# Patient Record
Sex: Female | Born: 1945 | Race: White | Hispanic: No | Smoking: Current every day smoker
Health system: Southern US, Community
[De-identification: ages and names within clinical notes are randomized; demographics above are authoritative.]

## PROBLEM LIST (undated history)

## (undated) DIAGNOSIS — I10 Essential (primary) hypertension: Secondary | ICD-10-CM

## (undated) DIAGNOSIS — E785 Hyperlipidemia, unspecified: Secondary | ICD-10-CM

## (undated) DIAGNOSIS — J449 Chronic obstructive pulmonary disease, unspecified: Secondary | ICD-10-CM

## (undated) DIAGNOSIS — Z87891 Personal history of nicotine dependence: Secondary | ICD-10-CM

## (undated) DIAGNOSIS — M810 Age-related osteoporosis without current pathological fracture: Secondary | ICD-10-CM

## (undated) DIAGNOSIS — J439 Emphysema, unspecified: Secondary | ICD-10-CM

## (undated) DIAGNOSIS — T7840XA Allergy, unspecified, initial encounter: Secondary | ICD-10-CM

## (undated) DIAGNOSIS — Z87898 Personal history of other specified conditions: Secondary | ICD-10-CM

## (undated) DIAGNOSIS — F419 Anxiety disorder, unspecified: Secondary | ICD-10-CM

## (undated) DIAGNOSIS — I739 Peripheral vascular disease, unspecified: Secondary | ICD-10-CM

## (undated) DIAGNOSIS — I7300 Raynaud's syndrome without gangrene: Secondary | ICD-10-CM

## (undated) DIAGNOSIS — C679 Malignant neoplasm of bladder, unspecified: Secondary | ICD-10-CM

## (undated) DIAGNOSIS — I209 Angina pectoris, unspecified: Secondary | ICD-10-CM

## (undated) DIAGNOSIS — I2510 Atherosclerotic heart disease of native coronary artery without angina pectoris: Secondary | ICD-10-CM

## (undated) DIAGNOSIS — K219 Gastro-esophageal reflux disease without esophagitis: Secondary | ICD-10-CM

## (undated) DIAGNOSIS — Z9861 Coronary angioplasty status: Secondary | ICD-10-CM

## (undated) DIAGNOSIS — E039 Hypothyroidism, unspecified: Secondary | ICD-10-CM

## (undated) DIAGNOSIS — F329 Major depressive disorder, single episode, unspecified: Secondary | ICD-10-CM

## (undated) HISTORY — DX: Coronary angioplasty status: Z98.61

## (undated) HISTORY — DX: Emphysema, unspecified: J43.9

## (undated) HISTORY — DX: Major depressive disorder, single episode, unspecified: F32.9

## (undated) HISTORY — DX: Hyperlipidemia, unspecified: E78.5

## (undated) HISTORY — DX: Raynaud's syndrome without gangrene: I73.00

## (undated) HISTORY — DX: Personal history of nicotine dependence: Z87.891

## (undated) HISTORY — DX: Atherosclerotic heart disease of native coronary artery without angina pectoris: I25.10

## (undated) HISTORY — DX: Allergy, unspecified, initial encounter: T78.40XA

## (undated) HISTORY — DX: Gastro-esophageal reflux disease without esophagitis: K21.9

## (undated) HISTORY — DX: Age-related osteoporosis without current pathological fracture: M81.0

## (undated) HISTORY — DX: Angina pectoris, unspecified: I20.9

## (undated) HISTORY — DX: Essential (primary) hypertension: I10

## (undated) HISTORY — PX: NM MYOVIEW LTD: HXRAD82

## (undated) HISTORY — DX: Hypothyroidism, unspecified: E03.9

## (undated) HISTORY — PX: ABDOMINAL HYSTERECTOMY: SHX81

## (undated) HISTORY — DX: Personal history of other specified conditions: Z87.898

---
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
## 1986-08-31 HISTORY — PX: BREAST BIOPSY: SHX20

## 1999-09-01 HISTORY — PX: COLONOSCOPY: SHX174

## 2000-09-28 ENCOUNTER — Other Ambulatory Visit: Admission: RE | Admit: 2000-09-28 | Discharge: 2000-09-28 | Payer: Self-pay | Admitting: Family Medicine

## 2002-10-12 ENCOUNTER — Other Ambulatory Visit: Admission: RE | Admit: 2002-10-12 | Discharge: 2002-10-12 | Payer: Self-pay | Admitting: Family Medicine

## 2003-11-08 ENCOUNTER — Encounter: Admission: RE | Admit: 2003-11-08 | Discharge: 2003-11-08 | Payer: Self-pay | Admitting: Family Medicine

## 2003-11-12 ENCOUNTER — Other Ambulatory Visit: Admission: RE | Admit: 2003-11-12 | Discharge: 2003-11-12 | Payer: Self-pay | Admitting: Family Medicine

## 2004-08-31 HISTORY — PX: COLONOSCOPY: SHX174

## 2004-11-12 ENCOUNTER — Other Ambulatory Visit: Admission: RE | Admit: 2004-11-12 | Discharge: 2004-11-12 | Payer: Self-pay | Admitting: Family Medicine

## 2004-11-13 ENCOUNTER — Encounter: Admission: RE | Admit: 2004-11-13 | Discharge: 2004-11-13 | Payer: Self-pay | Admitting: Family Medicine

## 2004-12-01 ENCOUNTER — Ambulatory Visit: Payer: Self-pay | Admitting: Internal Medicine

## 2004-12-23 ENCOUNTER — Ambulatory Visit: Payer: Self-pay | Admitting: Internal Medicine

## 2005-08-31 LAB — HM COLONOSCOPY: HM Colonoscopy: NORMAL

## 2005-10-01 DIAGNOSIS — I2510 Atherosclerotic heart disease of native coronary artery without angina pectoris: Secondary | ICD-10-CM

## 2005-10-01 HISTORY — DX: Atherosclerotic heart disease of native coronary artery without angina pectoris: I25.10

## 2005-10-14 ENCOUNTER — Ambulatory Visit (HOSPITAL_COMMUNITY): Admission: RE | Admit: 2005-10-14 | Discharge: 2005-10-15 | Payer: Self-pay | Admitting: Cardiovascular Disease

## 2005-10-14 HISTORY — PX: CORONARY ANGIOPLASTY WITH STENT PLACEMENT: SHX49

## 2005-11-13 ENCOUNTER — Encounter: Admission: RE | Admit: 2005-11-13 | Discharge: 2005-11-13 | Payer: Self-pay | Admitting: Family Medicine

## 2005-11-16 ENCOUNTER — Encounter: Admission: RE | Admit: 2005-11-16 | Discharge: 2005-11-16 | Payer: Self-pay | Admitting: Cardiovascular Disease

## 2005-11-16 ENCOUNTER — Encounter (INDEPENDENT_AMBULATORY_CARE_PROVIDER_SITE_OTHER): Payer: Self-pay | Admitting: *Deleted

## 2007-04-12 ENCOUNTER — Encounter: Admission: RE | Admit: 2007-04-12 | Discharge: 2007-04-12 | Payer: Self-pay | Admitting: Family Medicine

## 2009-01-22 ENCOUNTER — Encounter: Admission: RE | Admit: 2009-01-22 | Discharge: 2009-01-22 | Payer: Self-pay | Admitting: Family Medicine

## 2009-05-22 HISTORY — PX: TRANSTHORACIC ECHOCARDIOGRAM: SHX275

## 2009-11-18 ENCOUNTER — Encounter (INDEPENDENT_AMBULATORY_CARE_PROVIDER_SITE_OTHER): Payer: Self-pay | Admitting: *Deleted

## 2010-02-10 ENCOUNTER — Encounter: Admission: RE | Admit: 2010-02-10 | Discharge: 2010-02-10 | Payer: Self-pay | Admitting: Family Medicine

## 2010-09-30 NOTE — Letter (Signed)
Summary: Colonoscopy Date Change Letter  [facility]  [address] [place], [place] [address]   Phone: [phone]  Fax: [phone]      November 18, 2009 MRN: [id number]   Alexa Orozco [address] [place], [place]  [address]   Dear Alexa Orozco,   Previously you were recommended to have a repeat colonoscopy around this time. Your chart was recently reviewed by Dr. Hedwig Morton. Juanda Chance of [facility]. Follow up colonoscopy is now recommended in April 2013. This revised recommendation is based on current, nationally recognized guidelines for colorectal cancer screening and polyp surveillance. These guidelines are endorsed by the American Cancer Society, [employer] on Colorectal Cancer as well as numerous other major medical organizations.  Please understand that our recommendation assumes that you do not have any new symptoms such as bleeding, a change in bowel habits, anemia, or significant abdominal discomfort. If you do have any concerning GI symptoms or want to discuss the guideline recommendations, please call to arrange an office visit at your earliest convenience. Otherwise we will keep you in our reminder system and contact you 1-2 months prior to the date listed above to schedule your next colonoscopy.  Thank you,  Hedwig Morton. Juanda Chance, M.D.  [facility] [phone]

## 2011-01-16 NOTE — Discharge Summary (Signed)
Alexa Orozco, HEYDT NO.:  [id number]   MEDICAL RECORD NO.:  [id number]          PATIENT TYPE:  OIB   LOCATION:  6526                         FACILITY:  [facility]   PHYSICIAN:  Nicki Guadalajara, M.D.     DATE OF BIRTH:  1946-03-13   DATE OF ADMISSION:  10/14/2005  DATE OF DISCHARGE:  10/15/2005                                 DISCHARGE SUMMARY   ADMISSION DIAGNOSES:  1.  Chest pain with 85% right coronary artery stenosis.  2.  Hyperlipidemia.  3.  Tobacco use.  4.  Positive family.  5.  Asthma.  6.  Hypothyroidism with TSH of 9.357.  7.  Gastroesophageal reflux disease.  8.  Status post hysterectomy, lumpectomy right breast and polypectomies.   DISCHARGE DIAGNOSES:  1.  Chest pain with 85% right coronary artery stenosis.  2.  Hyperlipidemia.  3.  Tobacco use.  4.  Positive family.  5.  Asthma.  6.  Hypothyroidism with TSH of 9.357.  7.  Gastroesophageal reflux disease.  8.  Status post hysterectomy, lumpectomy right breast and polypectomies.   OPERATION/PROCEDURE:  1.  Cardiac catheterization, PTCA and stenting of the mid RCA, 85% stenosis      to 0, using a Cypher 3.5 x 18 mm stent.   BRIEF HISTORY:  The patient is a 65 year old white female who was referred by Dr. Margrett Rud for chest pain which started about three weeks prior to our seeing  her.  She was seen in the office and subsequently underwent cardiac  catheterization on October 09, 2005.  At that time she was found to have  minimal narrowing of the LAD and a focal 85% stenosis in the mid.  Ejection  fraction was 63%.  Evidence of mild hypokinesis involving the mid and very  low inferior wall.  She was admitted at this time electively for cardiac  catheterization, PTCA and stenting of her RCA.   ADMISSION MEDICATIONS:  1.  Lipitor 10 mg daily.  2.  Synthroid 0.88 mg daily.  3.  Premarin 0.9 mg daily.  4.  Toprol-XL 25 mg daily.  5.  Plavix 75 mg daily.  6.  Aspirin 81 mg daily.  7.   Imdur 60 mg daily.  8.  Prevacid 30 mg daily.   ALLERGIES:  KEFLEX.   HOSPITAL COURSE:  The patient was admitted, underwent PTCA in the cath lab.  She tolerated the procedure well and returned to 6500.  She was stable  overnight.  The following morning she was seen by Dr. Tresa Endo.  It was his  opinion she was ready for discharge.  Hemoglobin is 10.4, hematocrit 29.  White count is 5.8.  Platelets are 275,000.  Electrolytes show sodium 138,  potassium 3.5, chloride 107, CO2 27, BUN 10, creatinine 0.9, glucose 127.  The patient will be discharged home on Lipitor 10 mg daily, Synthroid 0.88  mg daily, Premarin 0.9 mg daily, Toprol-XL 25 mg daily, Plavix 75 mg daily,  aspirin 325 mg daily.  Imdur has been decreased to 30 mg daily and Prevacid  30 mg  daily.  In addition, she is going to be placed on Shantrex. She will  get a starting pack and then 1 mg b.i.d. for three months.  She will return  to see Dr. Tresa Endo in two weeks.      Eber Hong, P.A.    ______________________________  Nicki Guadalajara, M.D.    WDJ/MEDQ  D:  10/15/2005  T:  10/15/2005  Job:  [id number]   cc:   [place]ale [place]. Andrey Campanile, M.D.  Fax: [phone]

## 2011-01-16 NOTE — Cardiovascular Report (Signed)
NAMESUKANYA, GOLDBLATT NO.:  [id number]   MEDICAL RECORD NO.:  [id number]          PATIENT TYPE:  OIB   LOCATION:  6526                         FACILITY:  [facility]   PHYSICIAN:  Nicki Guadalajara, M.D.     DATE OF BIRTH:  11-Nov-1945   DATE OF PROCEDURE:  10/14/2005  DATE OF DISCHARGE:                              CARDIAC CATHETERIZATION   INDICATIONS:  [name] is a 65 year old female who I had recently seen.  She had a history of new onset chest discomfort for approximately three-  weeks duration. Because of risk factors, definitive catheterization was  performed and this was done on October 09, 2005. This revealed normal  systolic function with mild hypokinesis involving the midinferior and  inferolateral wall with an ejection fraction of 63%. She had a focal 85%  midright coronary artery stenosis. There was minimal 20% narrowing in the  LAD. The patient was started on Plavix therapy and in addition Toprol was  added to her regimen and her Imdur dose was increased. She is now scheduled  to undergo elective percutaneous coronary intervention at [facility]   PROCEDURE:  The patient has consented to participate in the CHAMPION trial.  Versed 2 milligrams was administered intravenously for conscious sedation.  Right femoral artery was punctured anteriorly and a 6-French sheath was  inserted. A 6-French right guiding catheter was inserted. Selective  angiography confirmed the previously noted 85% focal stenosis. There was not  evidence for thrombus. Angiomax was administered and the patient was given  CHAMPION study drug. A marker wire was advanced down the right coronary  artery after documentation of therapeutic anticoagulation. Primary stenting  was done with a 3.5 x 18 mm drug-eluting Cypher stent. Poststent dilatation  was done utilizing a 4.0 x 15 mm Quantum balloon. Scout angiography  confirmed an excellent angiographic result. Hemostasis was attained  by  direct manual pressure.   HEMODYNAMIC DATA:  Central aortic pressure was 127/69, mean 93. During the  procedure, the patient received at least three doses of 200 mcg  intracoronary nitroglycerin.   ANGIOGRAPHIC DATA:  At the start of the interventional procedure, the right  coronary was a large-caliber dominant vessel that had focal 85% midstenosis.  After the initial injection, there also was narrowing in the very proximal  portion of the RCA just after the catheter. This completely resolved  following IC nitroglycerin suggesting spasm to the proximal region. The IC  nitroglycerin did not change the 85% midstenosis. Following successful  stenting with a 3.5 x 18 mm Cypher stent postdilated to 4.0 mm, the 85% mid  stenosis was reduced to 0%. There was TIMI III flow. There was no evidence  for dissection.   OVERALL IMPRESSION:  1.  Successful primary stenting of the midright coronary artery utilizing a      3.5 x 18 mm drug-eluting Cypher stent postdilated to 4 mm.  2.  CHAMPION study drug with adjunctive bivalirudin administration for      anticoagulation.           ______________________________  Nicki Guadalajara,  M.D.     TK/MEDQ  D:  10/14/2005  T:  10/15/2005  Job:  [id number]   cc:   Emeline Gins. Andrey Campanile, M.D.  Fax: [phone]

## 2011-02-05 ENCOUNTER — Other Ambulatory Visit: Payer: Self-pay | Admitting: Family Medicine

## 2011-02-05 DIAGNOSIS — Z1231 Encounter for screening mammogram for malignant neoplasm of breast: Secondary | ICD-10-CM

## 2011-02-12 ENCOUNTER — Ambulatory Visit
Admission: RE | Admit: 2011-02-12 | Discharge: 2011-02-12 | Disposition: A | Discharge status: Home / Self Care | Payer: 59 | Source: Ambulatory Visit | Attending: Family Medicine | Admitting: Family Medicine

## 2011-02-12 DIAGNOSIS — Z1231 Encounter for screening mammogram for malignant neoplasm of breast: Secondary | ICD-10-CM

## 2011-02-19 ENCOUNTER — Encounter: Payer: Self-pay | Admitting: Physician Assistant

## 2011-02-19 DIAGNOSIS — Z780 Asymptomatic menopausal state: Secondary | ICD-10-CM

## 2011-02-19 DIAGNOSIS — Z720 Tobacco use: Secondary | ICD-10-CM

## 2011-02-19 DIAGNOSIS — I7300 Raynaud's syndrome without gangrene: Secondary | ICD-10-CM

## 2011-02-19 DIAGNOSIS — J449 Chronic obstructive pulmonary disease, unspecified: Secondary | ICD-10-CM

## 2011-02-19 DIAGNOSIS — E785 Hyperlipidemia, unspecified: Secondary | ICD-10-CM

## 2011-02-19 DIAGNOSIS — E039 Hypothyroidism, unspecified: Secondary | ICD-10-CM | POA: Insufficient documentation

## 2011-02-19 DIAGNOSIS — I2510 Atherosclerotic heart disease of native coronary artery without angina pectoris: Secondary | ICD-10-CM

## 2011-02-19 DIAGNOSIS — I10 Essential (primary) hypertension: Secondary | ICD-10-CM

## 2011-02-19 DIAGNOSIS — F329 Major depressive disorder, single episode, unspecified: Secondary | ICD-10-CM

## 2011-03-03 ENCOUNTER — Ambulatory Visit (INDEPENDENT_AMBULATORY_CARE_PROVIDER_SITE_OTHER): Payer: 59 | Admitting: Endocrinology

## 2011-03-03 ENCOUNTER — Encounter: Payer: Self-pay | Admitting: Endocrinology

## 2011-03-03 VITALS — BP 106/72 | HR 60 | Temp 98.2°F | Ht 62.0 in | Wt 158.8 lb

## 2011-03-03 DIAGNOSIS — L680 Hirsutism: Secondary | ICD-10-CM

## 2011-03-03 MED ORDER — EFLORNITHINE HCL 13.9 % EX CREA: TOPICAL_CREAM | CUTANEOUS | Status: DC

## 2011-03-03 NOTE — Progress Notes (Signed)
Subjective:    Patient ID: Alexa Orozco, female    DOB: [birth year], 65 y.o.   MRN: [id number]  HPI Pt states few years of moderate terminal hair on the face, neck, and chest.  No assoc acne.  She has been on estrace x approx 20 years, for menopausal sxs.  She says it has worked well.  A trial discontinuation last year was unsuccessful.   Past Medical History  Diagnosis Date  . Cataract     h/o bilat repair  . Asthma   . Depression   . Emphysema of lung   . Allergy   . Thyroid disease     Past Surgical History  Procedure Date  . Abdominal hysterectomy     h/o partila hysterectomy  . Tonsillectomy 1959  . Breast biopsy 1988    History   Social History  . Marital Status: Married    Spouse Name: N/A    Number of Children: N/A  . Years of Education: 10th    Occupational History  . Retired    Social History Main Topics  . Smoking status: Current Everyday Smoker -- 1.0 packs/day for 30 years    Types: Cigarettes  . Smokeless tobacco: Never Used  . Alcohol Use: No  . Drug Use: No  . Sexually Active:    Other Topics Concern  . Not on file   Social History Narrative  . No narrative on file    Current Outpatient Prescriptions on File Prior to Visit  Medication Sig Dispense Refill  . ALBUTEROL IN Inhale into the lungs. MDI uses prn       . aspirin 81 MG tablet Take 81 mg by mouth daily.        Marland Kitchen buPROPion (WELLBUTRIN SR) 150 MG 12 hr tablet Take 150 mg by mouth 2 (two) times daily. Start wellbutrin QD x 7 days then start to take BID       . citalopram (CELEXA) 20 MG tablet Take 20 mg by mouth daily. Weaning off take 20mg  QD x 2wks then QOD      . clopidogrel (PLAVIX) 75 MG tablet Take 75 mg by mouth daily.        Marland Kitchen estradiol (ESTRACE) 1 MG tablet Take 1 mg by mouth daily.        Marland Kitchen levothyroxine (SYNTHROID, LEVOTHROID) 88 MCG tablet Take 88 mcg by mouth daily.        Marland Kitchen tiotropium (SPIRIVA) 18 MCG inhalation capsule Place 18 mcg into inhaler and inhale daily.           Allergies  Allergen Reactions  . Keflex     No family history on file.  BP 106/72  Pulse 60  Temp(Src) 98.2 F (36.8 C) (Oral)  Ht 5\' 2"  (1.575 m)  Wt 158 lb 12.8 oz (72.031 kg)  BMI 29.04 kg/m2  SpO2 96%  Review of Systems denies dub, hair loss, excessive diaphoresis, insomnia, hyperpigmentation, cramps, numbness, and rash on the abdomen.  She reports weight gain, slight headache, urinary frequency, easy bruising, muscle weakness, and insomnia.  She attributes sob to copd.  Depression is well-controlled.     Objective:   Physical Exam VS: see vs page GEN: no distress HEAD: head: no deformity eyes: no periorbital swelling, no proptosis external nose and ears are normal mouth: no lesion seen NECK: supple, thyroid is not enlarged CHEST WALL: no deformity CV: reg rate and rhythm, no murmur ABD: abdomen is soft, nontender.  no hepatosplenomegaly.  not distended.  no  hernia MUSCULOSKELETAL: muscle bulk and strength are grossly normal.  no obvious joint swelling.  gait is normal and steady EXTEMITIES: no deformity.  no ulcer on the feet.  feet are of normal color and temp.  no edema PULSES: dorsalis pedis intact bilat.  no carotid bruit NEURO:  cn 2-12 grossly intact.   readily moves all 4's.  sensation is intact to touch on the feet SKIN:  Normal texture and temperature.  No rash or suspicious lesion is visible.  There is moderate terminal hair on the chin and anterior neck. NODES:  None palpable at the neck PSYCH: alert, oriented x3.  Does not appear anxious nor depressed.     outside test results are reviewed: Total estrogen=850 Prolactin=12 Testosterone=49 Tsh=0.15 Assessment & Plan:  Testosterone level is high considering menopausal status and estrogen supplementation Hirsutism, prob assoc with #1 Weight gain, not assoc with the above

## 2011-03-03 NOTE — Patient Instructions (Addendum)
Let's check and ultrasound of the ovaries.  you will be called with a day and time for an appointment.  Then please call [phone] to hear your test results.  You will be prompted to enter the 9-digit "MRN" number that appears at the top left of this page, followed by #.  Then you will hear the message. i have sent a prescription to [facility] for "vaniqa" cream. Please make a follow-up appointment in 6 months.

## 2011-03-05 ENCOUNTER — Telehealth: Payer: Self-pay | Admitting: *Deleted

## 2011-03-05 ENCOUNTER — Other Ambulatory Visit: Payer: Self-pay | Admitting: Endocrinology

## 2011-03-05 DIAGNOSIS — L680 Hirsutism: Secondary | ICD-10-CM

## 2011-03-05 MED ORDER — EFLORNITHINE HCL 13.9 % EX CREA: TOPICAL_CREAM | CUTANEOUS | Status: DC

## 2011-03-05 NOTE — Telephone Encounter (Signed)
sent 

## 2011-03-05 NOTE — Telephone Encounter (Signed)
Pt advised.

## 2011-03-05 NOTE — Telephone Encounter (Signed)
Pt requesting that Rx prescribed at 03/03/11 OV [Eflornithine/Vaniqa] sent in to [facility] be changed to [facility] Dowse has a credit card for [facility]].

## 2011-03-09 ENCOUNTER — Ambulatory Visit
Admission: RE | Admit: 2011-03-09 | Discharge: 2011-03-09 | Disposition: A | Discharge status: Home / Self Care | Payer: 59 | Source: Ambulatory Visit | Attending: Endocrinology | Admitting: Endocrinology

## 2011-03-09 DIAGNOSIS — L680 Hirsutism: Secondary | ICD-10-CM

## 2011-11-02 ENCOUNTER — Encounter: Payer: Self-pay | Admitting: Internal Medicine

## 2011-12-29 ENCOUNTER — Other Ambulatory Visit: Payer: Self-pay | Admitting: Family Medicine

## 2011-12-29 DIAGNOSIS — Z780 Asymptomatic menopausal state: Secondary | ICD-10-CM

## 2011-12-29 DIAGNOSIS — Z1231 Encounter for screening mammogram for malignant neoplasm of breast: Secondary | ICD-10-CM

## 2012-02-15 ENCOUNTER — Ambulatory Visit
Admission: RE | Admit: 2012-02-15 | Discharge: 2012-02-15 | Disposition: A | Discharge status: Home / Self Care | Payer: 59 | Source: Ambulatory Visit | Attending: Family Medicine | Admitting: Family Medicine

## 2012-02-15 DIAGNOSIS — Z1231 Encounter for screening mammogram for malignant neoplasm of breast: Secondary | ICD-10-CM

## 2012-02-15 DIAGNOSIS — Z780 Asymptomatic menopausal state: Secondary | ICD-10-CM

## 2012-12-01 ENCOUNTER — Encounter: Payer: Self-pay | Admitting: Internal Medicine

## 2012-12-08 ENCOUNTER — Other Ambulatory Visit (INDEPENDENT_AMBULATORY_CARE_PROVIDER_SITE_OTHER): Payer: Medicare Other

## 2012-12-08 ENCOUNTER — Other Ambulatory Visit: Payer: Self-pay | Admitting: Family Medicine

## 2012-12-08 DIAGNOSIS — I10 Essential (primary) hypertension: Secondary | ICD-10-CM

## 2012-12-08 DIAGNOSIS — E039 Hypothyroidism, unspecified: Secondary | ICD-10-CM

## 2012-12-08 DIAGNOSIS — Z0000 Encounter for general adult medical examination without abnormal findings: Secondary | ICD-10-CM

## 2012-12-08 DIAGNOSIS — E782 Mixed hyperlipidemia: Secondary | ICD-10-CM | POA: Diagnosis not present

## 2012-12-08 DIAGNOSIS — I259 Chronic ischemic heart disease, unspecified: Secondary | ICD-10-CM

## 2012-12-08 DIAGNOSIS — E559 Vitamin D deficiency, unspecified: Secondary | ICD-10-CM

## 2012-12-08 LAB — COMPLETE METABOLIC PANEL WITH GFR
ALT: 23 U/L (ref 0–35)
AST: 22 U/L (ref 0–37)
CO2: 28 mEq/L (ref 19–32)
Calcium: 9.9 mg/dL (ref 8.4–10.5)
Chloride: 106 mEq/L (ref 96–112)
GFR, Est African American: 89 mL/min
Sodium: 141 mEq/L (ref 135–145)
Total Bilirubin: 0.5 mg/dL (ref 0.3–1.2)
Total Protein: 7.2 g/dL (ref 6.0–8.3)

## 2012-12-08 LAB — LIPID PANEL
HDL: 70 mg/dL (ref >39)
LDL Cholesterol: 92 mg/dL (ref 0–99)
Triglycerides: 118 mg/dL (ref <150)
VLDL: 24 mg/dL (ref 0–40)

## 2012-12-08 LAB — TSH: TSH: 1.959 u[IU]/mL (ref 0.350–4.500)

## 2012-12-09 ENCOUNTER — Telehealth: Payer: Self-pay | Admitting: Family Medicine

## 2012-12-09 NOTE — Telephone Encounter (Signed)
Pt called.  Left mess lab results are here and provider want her to call and make routine follow up appt to discuss.

## 2012-12-09 NOTE — Telephone Encounter (Signed)
Message copied by Donne Anon on Fri Dec 09, 2012 11:00 AM ------      Message from: Lynnea Ferrier T      Created: Thu Dec 08, 2012  4:41 PM       Stable for OV ------

## 2012-12-15 ENCOUNTER — Encounter: Payer: Self-pay | Admitting: Family Medicine

## 2012-12-15 ENCOUNTER — Ambulatory Visit (INDEPENDENT_AMBULATORY_CARE_PROVIDER_SITE_OTHER): Payer: Medicare Other | Admitting: Family Medicine

## 2012-12-15 VITALS — BP 100/68 | HR 62 | Temp 98.1°F | Resp 18 | Wt 170.0 lb

## 2012-12-15 DIAGNOSIS — L255 Unspecified contact dermatitis due to plants, except food: Secondary | ICD-10-CM | POA: Diagnosis not present

## 2012-12-15 DIAGNOSIS — J209 Acute bronchitis, unspecified: Secondary | ICD-10-CM | POA: Diagnosis not present

## 2012-12-15 MED ORDER — AZITHROMYCIN 250 MG PO TABS: ORAL_TABLET | ORAL | Status: DC

## 2012-12-15 MED ORDER — TRIAMCINOLONE ACETONIDE 0.1 % EX CREA: TOPICAL_CREAM | Freq: Two times a day (BID) | CUTANEOUS | Status: DC

## 2012-12-15 MED ORDER — HYDROCODONE-HOMATROPINE 5-1.5 MG/5ML PO SYRP: 5.0000 mL | ORAL_SOLUTION | ORAL | Status: DC | PRN

## 2012-12-15 NOTE — Progress Notes (Signed)
  Subjective:    Patient ID: Alexa Orozco, female    DOB: 04/25/1946, 67 y.o.   MRN: [id number]  HPI  Patient has a past medical history of COPD. She reports 2 days of chest congestion, worsening cough, and some sore throat. She denies any fever. The cough was originally productive of yellow to whitish sputum. However is now a nonproductive cough.  She denies any shortness of breath or chest pain.  She also has poison ivy on her wrist and forearm and trunk. It has been there for days. Is very itchy.  It occurred after some yard work. She tried over-the-counter cortisone cream without benefit. Past Medical History  Diagnosis Date  . Cataract     h/o bilat repair  . Asthma   . Depression   . Emphysema of lung   . Allergy   . Thyroid disease    Current Outpatient Prescriptions on File Prior to Visit  Medication Sig Dispense Refill  . ALBUTEROL IN Inhale into the lungs. MDI uses prn       . aspirin 81 MG tablet Take 81 mg by mouth daily.        . citalopram (CELEXA) 20 MG tablet Take 20 mg by mouth daily. Weaning off take 20mg  QD x 2wks then QOD      . clopidogrel (PLAVIX) 75 MG tablet Take 75 mg by mouth daily.        Marland Kitchen estradiol (ESTRACE) 1 MG tablet Take 1 mg by mouth daily.        Marland Kitchen levothyroxine (SYNTHROID, LEVOTHROID) 88 MCG tablet Take 88 mcg by mouth daily.        Marland Kitchen tiotropium (SPIRIVA) 18 MCG inhalation capsule Place 18 mcg into inhaler and inhale daily.         No current facility-administered medications on file prior to visit.     Review of Systems     Objective:   Physical Exam  Constitutional: She appears well-developed and well-nourished.  HENT:  Head: Normocephalic.  Right Ear: External ear normal.  Left Ear: External ear normal.  Mouth/Throat: Oropharynx is clear and moist.  Eyes: Conjunctivae and EOM are normal. Pupils are equal, round, and reactive to light.  Neck: Normal range of motion. Neck supple.  Cardiovascular: Normal rate and normal heart sounds.   No  murmur heard. Pulmonary/Chest: Effort normal and breath sounds normal. No respiratory distress. She has no wheezes. She has no rales.  Lymphadenopathy:    She has no cervical adenopathy.   red papular rash and linear grouping on right abdomen, right forearm, and right wrist.        Assessment & Plan:  Acute bronchitis  Rhus dermatitis  Recommended tincture of time. I think bronchitis most likely viral. I did give the patient prescription for a Z-Pak, but I asked her not to fill it unless symptoms dramatically worsen. He is to use Mucinex DM 2 tablets Q. 12 hours when necessary cough. I did give her Hycodan 5 mL's by mouth every 4 hours when necessary.    Prescribed triamcinolone 0.1% ointment to be applied 2-3 times a day when necessary itching for the dermatitis. Recheck in 48 hours if no better sooner if worse.

## 2012-12-15 NOTE — Telephone Encounter (Signed)
error 

## 2013-01-17 ENCOUNTER — Encounter: Payer: Self-pay | Admitting: Family Medicine

## 2013-01-17 ENCOUNTER — Other Ambulatory Visit: Payer: Self-pay

## 2013-01-17 ENCOUNTER — Ambulatory Visit (INDEPENDENT_AMBULATORY_CARE_PROVIDER_SITE_OTHER): Payer: Medicare Other | Admitting: Family Medicine

## 2013-01-17 VITALS — BP 136/86 | HR 64 | Temp 97.6°F | Resp 16 | Ht 60.75 in | Wt 170.0 lb

## 2013-01-17 DIAGNOSIS — I10 Essential (primary) hypertension: Secondary | ICD-10-CM

## 2013-01-17 DIAGNOSIS — J449 Chronic obstructive pulmonary disease, unspecified: Secondary | ICD-10-CM | POA: Diagnosis not present

## 2013-01-17 DIAGNOSIS — E785 Hyperlipidemia, unspecified: Secondary | ICD-10-CM

## 2013-01-17 DIAGNOSIS — R0609 Other forms of dyspnea: Secondary | ICD-10-CM | POA: Diagnosis not present

## 2013-01-17 DIAGNOSIS — Z2911 Encounter for prophylactic immunotherapy for respiratory syncytial virus (RSV): Secondary | ICD-10-CM

## 2013-01-17 DIAGNOSIS — I2510 Atherosclerotic heart disease of native coronary artery without angina pectoris: Secondary | ICD-10-CM

## 2013-01-17 DIAGNOSIS — Z1231 Encounter for screening mammogram for malignant neoplasm of breast: Secondary | ICD-10-CM

## 2013-01-17 DIAGNOSIS — Z23 Encounter for immunization: Secondary | ICD-10-CM

## 2013-01-17 DIAGNOSIS — R0600 Dyspnea, unspecified: Secondary | ICD-10-CM

## 2013-01-17 LAB — HEPATIC FUNCTION PANEL
ALT: 15 U/L (ref 0–35)
Bilirubin, Direct: 0.1 mg/dL (ref 0.0–0.3)
Indirect Bilirubin: 0.4 mg/dL (ref 0.0–0.9)
Total Bilirubin: 0.5 mg/dL (ref 0.3–1.2)

## 2013-01-17 LAB — LIPID PANEL
Cholesterol: 171 mg/dL (ref 0–200)
HDL: 70 mg/dL (ref >39)
LDL Cholesterol: 86 mg/dL (ref 0–99)
Total CHOL/HDL Ratio: 2.4 Ratio
Triglycerides: 75 mg/dL (ref <150)
VLDL: 15 mg/dL (ref 0–40)

## 2013-01-17 LAB — CBC WITH DIFFERENTIAL/PLATELET
Eosinophils Relative: 4 % (ref 0–5)
HCT: 40.5 % (ref 36.0–46.0)
Lymphocytes Relative: 33 % (ref 12–46)
Lymphs Abs: 2.4 10*3/uL (ref 0.7–4.0)
MCV: 96 fL (ref 78.0–100.0)
Monocytes Absolute: 0.7 10*3/uL (ref 0.1–1.0)
Platelets: 324 10*3/uL (ref 150–400)
RBC: 4.22 MIL/uL (ref 3.87–5.11)
WBC: 7.4 10*3/uL (ref 4.0–10.5)

## 2013-01-17 LAB — BASIC METABOLIC PANEL
BUN: 18 mg/dL (ref 6–23)
Chloride: 103 mEq/L (ref 96–112)
Creat: 0.8 mg/dL (ref 0.50–1.10)

## 2013-01-17 MED ORDER — BUDESONIDE-FORMOTEROL FUMARATE 160-4.5 MCG/ACT IN AERO: 2.0000 | INHALATION_SPRAY | Freq: Two times a day (BID) | RESPIRATORY_TRACT | Status: DC

## 2013-01-17 MED ORDER — TIOTROPIUM BROMIDE MONOHYDRATE 18 MCG IN CAPS: 18.0000 ug | ORAL_CAPSULE | Freq: Every day | RESPIRATORY_TRACT | Status: DC

## 2013-01-17 NOTE — Addendum Note (Signed)
Addended by: Donne Anon on: 01/17/2013 11:21 AM   Modules accepted: Orders

## 2013-01-17 NOTE — Progress Notes (Signed)
Subjective:    Patient ID: Alexa Orozco, female    DOB: [birth year], 67 y.o.   MRN: [id number]  HPI  Patient presents with one month of dyspnea on exertion. She denies any chest pain. She denies any angina. She states she is becoming more easily winded particularly when she is outside or walking. She states she is also wheezing more. She has stopped using spiriva one week ago. Currently she is only using her rescue inhaler. She denies any fevers chills or hemoptysis. She denies any weight loss. She denies any bruising. She does have some fatigue. She denies any night sweats or fevers.  Her last stress test was performed 03/17/2011 which showed no ischemia or infarction and a normal ejection fraction. She has a history of a stent in her right coronary artery 2007. Past Medical History  Diagnosis Date  . Cataract     h/o bilat repair  . Asthma   . Depression   . Emphysema of lung   . Allergy   . Thyroid disease    Current Outpatient Prescriptions on File Prior to Visit  Medication Sig Dispense Refill  . ALBUTEROL IN Inhale into the lungs. MDI uses prn       . aspirin 81 MG tablet Take 81 mg by mouth daily.        . citalopram (CELEXA) 20 MG tablet Take 20 mg by mouth daily. Weaning off take 20mg  QD x 2wks then QOD      . clopidogrel (PLAVIX) 75 MG tablet Take 75 mg by mouth daily.        Marland Kitchen estradiol (ESTRACE) 1 MG tablet Take 1 mg by mouth daily.        Marland Kitchen levothyroxine (SYNTHROID, LEVOTHROID) 88 MCG tablet Take 88 mcg by mouth daily.        Marland Kitchen triamcinolone cream (KENALOG) 0.1 % Apply topically 2 (two) times daily.  30 g  0  . azithromycin (ZITHROMAX) 250 MG tablet 2 pills on day 1, 1 pill on day 2-5  6 tablet  0  . HYDROcodone-homatropine (HYCODAN) 5-1.5 MG/5ML syrup Take 5 mLs by mouth every 4 (four) hours as needed for cough.  120 mL  0   No current facility-administered medications on file prior to visit.   Allergies  Allergen Reactions  . Cephalexin    History   Social History  .  Marital Status: Married    Spouse Name: N/A    Number of Children: N/A  . Years of Education: 10th    Occupational History  . Retired    Social History Main Topics  . Smoking status: Current Every Day Smoker -- 1.00 packs/day for 30 years    Types: Cigarettes  . Smokeless tobacco: Never Used  . Alcohol Use: No  . Drug Use: No  . Sexually Active:    Other Topics Concern  . Not on file   Social History Narrative  . No narrative on file     Review of Systems  All other systems reviewed and are negative.       Objective:   Physical Exam  Vitals reviewed. Constitutional: She appears well-developed and well-nourished.  Eyes: Conjunctivae are normal. Pupils are equal, round, and reactive to light.  Neck: Neck supple. No JVD present. No thyromegaly present.  Cardiovascular: Normal rate, regular rhythm and normal heart sounds.  Exam reveals no gallop and no friction rub.   No murmur heard. Pulmonary/Chest: Effort normal and breath sounds normal. No respiratory distress. She has no  wheezes. She has no rales.  Abdominal: Soft. Bowel sounds are normal. She exhibits no distension. There is no tenderness. There is no rebound.  Lymphadenopathy:    She has no cervical adenopathy.  Skin: Skin is warm. No rash noted. No erythema. No pallor.          Assessment & Plan:  1. CAD (coronary artery disease) See the patient back in one month. If my treatment of COPD does not improve her shortness of breath she may need to get an - Basic Metabolic Panel - Hepatic Function Panel - Lipid Panel  2. COPD (chronic obstructive pulmonary disease) I think continued smoking and COPD is the likely cause of her dyspnea on exertion. Resume Spiriva 1 inhalation daily and add Symbicort twice a day.  Her smoking cessation. Follow up in one month the - tiotropium (SPIRIVA) 18 MCG inhalation capsule; Place 1 capsule (18 mcg total) into inhaler and inhale daily.  Dispense: 30 capsule; Refill: 5 -  budesonide-formoterol (SYMBICORT) 160-4.5 MCG/ACT inhaler; Inhale 2 puffs into the lungs 2 (two) times daily.  Dispense: 1 Inhaler; Refill: 3  3. HLD (hyperlipidemia) - Basic Metabolic Panel - Hepatic Function Panel - Lipid Panel  4. HTN (hypertension) Pressures currently well controlled, continue same medications. - Basic Metabolic Panel  5. Dyspnea on exertion see problem #2. - CBC with Differential - TSH

## 2013-01-26 ENCOUNTER — Telehealth: Payer: Self-pay | Admitting: Physician Assistant

## 2013-01-26 MED ORDER — ESTRADIOL 1 MG PO TABS: 1.0000 mg | ORAL_TABLET | Freq: Every day | ORAL | Status: DC

## 2013-01-26 NOTE — Telephone Encounter (Signed)
Medication refilled per protocol. 

## 2013-01-26 NOTE — Telephone Encounter (Signed)
Estradiol 1mg  take one tablet my mouth daily last refilled 10/27/12 insurance will cover 90 days supply. Please rx 90 days.

## 2013-02-02 ENCOUNTER — Other Ambulatory Visit: Payer: Self-pay | Admitting: Physician Assistant

## 2013-02-02 MED ORDER — CITALOPRAM HYDROBROMIDE 20 MG PO TABS: 20.0000 mg | ORAL_TABLET | Freq: Every day | ORAL | Status: DC

## 2013-02-02 NOTE — Telephone Encounter (Signed)
Are you weaning her off celexa??  See last OV.  Please advise

## 2013-02-02 NOTE — Telephone Encounter (Signed)
If she has weaned off, she does not need a refill.  Please clarify with patient.

## 2013-02-02 NOTE — Telephone Encounter (Signed)
Pt states she is still taking, is not weaning off  RF sent to pharmacy

## 2013-02-02 NOTE — Addendum Note (Signed)
Addended by: Donne Anon on: 02/02/2013 02:11 PM   Modules accepted: Orders

## 2013-02-06 ENCOUNTER — Ambulatory Visit: Payer: Self-pay | Admitting: Physician Assistant

## 2013-02-17 ENCOUNTER — Ambulatory Visit: Payer: 59

## 2013-02-20 ENCOUNTER — Ambulatory Visit: Payer: 59

## 2013-02-27 ENCOUNTER — Ambulatory Visit
Admission: RE | Admit: 2013-02-27 | Discharge: 2013-02-27 | Disposition: A | Discharge status: Home / Self Care | Payer: 59 | Source: Ambulatory Visit

## 2013-02-27 DIAGNOSIS — Z1231 Encounter for screening mammogram for malignant neoplasm of breast: Secondary | ICD-10-CM | POA: Diagnosis not present

## 2013-03-04 ENCOUNTER — Other Ambulatory Visit: Payer: Self-pay | Admitting: Cardiovascular Disease

## 2013-03-06 NOTE — Telephone Encounter (Signed)
Rx was sent to pharmacy electronically. 

## 2013-03-16 ENCOUNTER — Telehealth: Payer: Self-pay | Admitting: Physician Assistant

## 2013-03-16 MED ORDER — PANTOPRAZOLE SODIUM 40 MG PO TBEC: 40.0000 mg | DELAYED_RELEASE_TABLET | Freq: Every day | ORAL | Status: DC

## 2013-03-16 NOTE — Telephone Encounter (Signed)
Medication refilled per protocol. 

## 2013-03-21 ENCOUNTER — Ambulatory Visit (INDEPENDENT_AMBULATORY_CARE_PROVIDER_SITE_OTHER): Payer: Medicare Other | Admitting: Family Medicine

## 2013-03-21 ENCOUNTER — Encounter: Payer: Self-pay | Admitting: Family Medicine

## 2013-03-21 VITALS — BP 124/78 | HR 68 | Temp 97.6°F | Resp 14 | Wt 171.0 lb

## 2013-03-21 DIAGNOSIS — I889 Nonspecific lymphadenitis, unspecified: Secondary | ICD-10-CM | POA: Diagnosis not present

## 2013-03-21 MED ORDER — AMOXICILLIN 500 MG PO CAPS: 500.0000 mg | ORAL_CAPSULE | Freq: Three times a day (TID) | ORAL | Status: DC

## 2013-03-21 NOTE — Progress Notes (Signed)
  Subjective:    Patient ID: Alexa Orozco, female    DOB: 1945-12-18, 67 y.o.   MRN: [id number]  HPI  Patient presents with left jaw/neck pain for the past 24 hours. She felt like there was something swallowing on the side of her neck was painful when she ate. She denies any tooth pain or abscess. She denies any fever nausea vomiting shortness of breath. She denies any difficulty swallowing.  Review of Systems - per above  GEN- denies fatigue, fever, weight loss,weakness, recent illness HEENT- denies eye drainage, change in vision, nasal discharge,sore throat CVS- denies chest pain, palpitations RESP- denies SOB, cough, wheeze        Objective:   Physical Exam GEN- NAD, alert and oriented x3 HEENT- PERRL, EOMI, non injected sclera, pink conjunctiva, MMM, oropharynx clear, TM clear bilat, no effusion, nares clear, no sinus tenderness, fair dentition, gingival disease noted Neck- Supple, 1.5cm lymph node left anterior cervical chain-palpated, TTP, no fluctuance, no erythema CVS- RRR, no murmur RESP-CTAB          Assessment & Plan:

## 2013-03-21 NOTE — Patient Instructions (Signed)
Take antibiotics as prescribed You can take tylenol or ibuprofen Return if not improved

## 2013-03-21 NOTE — Assessment & Plan Note (Signed)
I will treat with course of antibiotics to see if this improves node and pain She can take OTC pain relievers Return if no improvement, would re-examine, +/- ultrasound of neck

## 2013-03-27 ENCOUNTER — Ambulatory Visit (INDEPENDENT_AMBULATORY_CARE_PROVIDER_SITE_OTHER): Payer: Medicare Other | Admitting: Physician Assistant

## 2013-03-27 ENCOUNTER — Encounter: Payer: Self-pay | Admitting: Physician Assistant

## 2013-03-27 VITALS — BP 126/74 | HR 64 | Temp 98.0°F | Resp 18 | Wt 174.0 lb

## 2013-03-27 DIAGNOSIS — J449 Chronic obstructive pulmonary disease, unspecified: Secondary | ICD-10-CM | POA: Diagnosis not present

## 2013-03-27 DIAGNOSIS — L259 Unspecified contact dermatitis, unspecified cause: Secondary | ICD-10-CM | POA: Diagnosis not present

## 2013-03-27 DIAGNOSIS — L239 Allergic contact dermatitis, unspecified cause: Secondary | ICD-10-CM

## 2013-03-27 MED ORDER — TIOTROPIUM BROMIDE MONOHYDRATE 18 MCG IN CAPS: 18.0000 ug | ORAL_CAPSULE | Freq: Every day | RESPIRATORY_TRACT | Status: DC

## 2013-03-27 MED ORDER — PREDNISONE 20 MG PO TABS: ORAL_TABLET | ORAL | Status: DC

## 2013-03-27 NOTE — Progress Notes (Signed)
Patient ID: Nariah Morgano MRN: [id number], DOB: 1945-09-28, 67 y.o. Date of Encounter: 03/27/2013, 7:24 PM    Chief Complaint:  Chief Complaint  Patient presents with  . c/o rash on her bottom     HPI: 67 y.o. year old white female reports itchy rash on hr bottom for 2 days. Did not feel any bite or sting. Has sat in warm water-no other treatment.    Home Meds: See attached medication section for any medications that were entered at today's visit. The computer does not put those onto this list.The following list is a list of meds entered prior to today's visit.   Current Outpatient Prescriptions on File Prior to Visit  Medication Sig Dispense Refill  . ALBUTEROL IN Inhale into the lungs. MDI uses prn       . amoxicillin (AMOXIL) 500 MG capsule Take 1 capsule (500 mg total) by mouth 3 (three) times daily.  21 capsule  0  . aspirin 81 MG tablet Take 81 mg by mouth daily.        . budesonide-formoterol (SYMBICORT) 160-4.5 MCG/ACT inhaler Inhale 2 puffs into the lungs 2 (two) times daily.  1 Inhaler  3  . citalopram (CELEXA) 20 MG tablet Take 1 tablet (20 mg total) by mouth daily.  30 tablet  5  . clopidogrel (PLAVIX) 75 MG tablet TAKE 1 TABLET BY MOUTH ONCE A DAY  30 tablet  4  . estradiol (ESTRACE) 1 MG tablet Take 1 tablet (1 mg total) by mouth daily.  909 tablet  3  . levothyroxine (SYNTHROID, LEVOTHROID) 88 MCG tablet Take 88 mcg by mouth daily.        . pantoprazole (PROTONIX) 40 MG tablet Take 1 tablet (40 mg total) by mouth daily.  30 tablet  11  . triamcinolone cream (KENALOG) 0.1 % Apply topically 2 (two) times daily.  30 g  0   No current facility-administered medications on file prior to visit.    Allergies:  Allergies  Allergen Reactions  . Cephalexin       Review of Systems: See HPI for pertinent ROS. All other ROS negative.    Physical Exam: Blood pressure 126/74, pulse 64, temperature 98 F (36.7 C), temperature source Oral, resp. rate 18, weight 174 lb (78.926  kg)., Body mass index is 33.15 kg/(m^2). General: WNWD WF. Appears in no acute distress. Lungs: Clear bilaterally to auscultation without wheezes, rales, or rhonchi. Breathing is unlabored. Heart: Regular rhythm. No murmurs, rubs, or gallops. Msk:  Strength and tone normal for age. Extremities/Skin: Warm and dry. Just above crease buttocks, is area of rash. There is a one inch diameter area of pink urticaria there. Just below this, at the level of the top of each buttock, there is another area of urticaria, which is slightly firm and has some areas sec to excoriation. Neuro: Alert and oriented X 3. Moves all extremities spontaneously. Gait is normal. CNII-XII grossly in tact. Psych:  Responds to questions appropriately with a normal affect.     ASSESSMENT AND PLAN:  67 y.o. year old female with  1. Allergic dermatitis - predniSONE (DELTASONE) 20 MG tablet; Take 3 daily for 2 days, then 2 daily for 2 days, then 1 daily for 2 days.  Dispense: 12 tablet; Refill: 0 Oral Benadryl as directed. If worsens, does not resolve, f/u.  2. COPD (chronic obstructive pulmonary disease)-this was not addressed at OV. Nurse had put this in b/c pt requested refill- - tiotropium (SPIRIVA) 18 MCG inhalation  capsule; Place 1 capsule (18 mcg total) into inhaler and inhale daily.  Dispense: 30 capsule; Refill: [address] [place], [place], [facility] 03/27/2013 7:24 PM

## 2013-05-09 ENCOUNTER — Other Ambulatory Visit: Payer: Self-pay | Admitting: Cardiovascular Disease

## 2013-05-10 MED ORDER — ATORVASTATIN CALCIUM 40 MG PO TABS: 40.0000 mg | ORAL_TABLET | Freq: Every day | ORAL | Status: DC

## 2013-05-10 NOTE — Telephone Encounter (Signed)
Rx was sent to pharmacy electronically. 

## 2013-05-30 ENCOUNTER — Encounter: Payer: Self-pay | Admitting: *Deleted

## 2013-05-31 ENCOUNTER — Other Ambulatory Visit: Payer: Self-pay | Admitting: Cardiovascular Disease

## 2013-05-31 ENCOUNTER — Encounter: Payer: Self-pay | Admitting: Cardiology

## 2013-05-31 ENCOUNTER — Ambulatory Visit (INDEPENDENT_AMBULATORY_CARE_PROVIDER_SITE_OTHER): Payer: Medicare Other | Admitting: Cardiology

## 2013-05-31 VITALS — BP 130/82 | HR 53 | Ht 62.0 in | Wt 177.3 lb

## 2013-05-31 DIAGNOSIS — Z9861 Coronary angioplasty status: Secondary | ICD-10-CM

## 2013-05-31 DIAGNOSIS — I2510 Atherosclerotic heart disease of native coronary artery without angina pectoris: Secondary | ICD-10-CM | POA: Diagnosis not present

## 2013-05-31 DIAGNOSIS — E785 Hyperlipidemia, unspecified: Secondary | ICD-10-CM | POA: Insufficient documentation

## 2013-05-31 DIAGNOSIS — I10 Essential (primary) hypertension: Secondary | ICD-10-CM

## 2013-05-31 DIAGNOSIS — I739 Peripheral vascular disease, unspecified: Secondary | ICD-10-CM

## 2013-05-31 DIAGNOSIS — Z720 Tobacco use: Secondary | ICD-10-CM

## 2013-05-31 DIAGNOSIS — F17200 Nicotine dependence, unspecified, uncomplicated: Secondary | ICD-10-CM

## 2013-05-31 DIAGNOSIS — I209 Angina pectoris, unspecified: Secondary | ICD-10-CM

## 2013-05-31 DIAGNOSIS — I208 Other forms of angina pectoris: Secondary | ICD-10-CM | POA: Insufficient documentation

## 2013-05-31 DIAGNOSIS — E669 Obesity, unspecified: Secondary | ICD-10-CM | POA: Insufficient documentation

## 2013-05-31 MED ORDER — ISOSORBIDE MONONITRATE ER 30 MG PO TB24: 30.0000 mg | ORAL_TABLET | Freq: Every day | ORAL | Status: DC

## 2013-05-31 MED ORDER — NITROGLYCERIN 0.4 MG SL SUBL: 0.4000 mg | SUBLINGUAL_TABLET | SUBLINGUAL | Status: DC | PRN

## 2013-05-31 NOTE — Telephone Encounter (Signed)
Rx was sent to pharmacy electronically. 

## 2013-05-31 NOTE — Patient Instructions (Addendum)
Psoas sounds like over last couple weeks she started having some discomfort this sounds like it may very well be angina.  It is now really only seems to be on heavy exertion.  And you are not on any medication for this.   I'm going to start you on a medicine called Imdur which is a long-acting nitroglycerin.    I am also going to give you a prescription for as needed nitroglycerin but she can take when the pain comes and doesn't go away with rest.  I'm going to order a LexiScan Myoview stress test to investigate your heart arteries disease.  If any time the discomfort you're having comes on with less exertion/regular activity please contact us and may very well skip the stress test and good rectal heart catheterization.  If the symptoms occur at rest please take nitroglycerin and go to the hospital.  If it does not go away after the first nitroglycerin call 911 and take a second nitroglycerin.  I am also concerned about the pain you're having your legs notably in the left with walking.  This symptom is consistent with the septum: Claudication.  This and the angina are both intramural to be her long-term smoking.  To evaluate this:  I'm going to order artery Dopplers and ankle brachial index measurements of your lower legs.  I will see back.  Shortly after you to the stress test.  We also talked about smoking cessation -- consider E-cigarettes; look up [place] Quitline (or Quitline [place]) for additional options.  So her and she is  I  Marykay Lex, MD   Your physician has requested that you have a lexiscan myoview. For further information please visit [email or web address] Please follow instruction sheet, as given.  Your physician has requested that you have a lower extremity arterial exercise duplex. During this test, exercise and ultrasound are used to evaluate arterial blood flow in the legs. Allow one hour for this exam. There are no restrictions or special instructions.  Your physician wants  you to follow-up in: ~2-3 weeks. You will receive a reminder letter in the mail if time allows.. If you don't receive a letter, please call our office to schedule the follow-up appointment within 2-3 weeks.      Marland Kitchen

## 2013-06-01 ENCOUNTER — Encounter: Payer: Self-pay | Admitting: Cardiology

## 2013-06-01 DIAGNOSIS — I739 Peripheral vascular disease, unspecified: Secondary | ICD-10-CM

## 2013-06-01 HISTORY — DX: Peripheral vascular disease, unspecified: I73.9

## 2013-06-01 NOTE — Assessment & Plan Note (Signed)
I spent at least 5 minutes talking to her about how important is her stop smoking.  The coronary disease and also claudication are all related to smoking.  I gave her options of Nicoderm patches, electronic cigarettes, Chantix.  I related information about [place] Quit-Line.  She seem to listen, but was still contemplating whether she wants to go down this Road. Hopefully the results of his upcoming studies will help boost my argument.

## 2013-06-01 NOTE — Assessment & Plan Note (Signed)
Her blood pressures actually well-controlled.  Please see above for thoughts

## 2013-06-01 NOTE — Assessment & Plan Note (Signed)
She generally has symptoms that are concerning for angina, they're not occurring with much exertion but would qualify for a class II in class III angina at this point.  As of today she is not really on any antianginal medications.  She is on statin as well as aspirin Plavix or but not on a beta blocker (she's been intolerant of Bystolic in the past) no nitrate or ACE inhibitor.  Plan:  1. Prescribed Sublingual Nitroglycerin, Imdur 30 mg daily.   2. I would like to prescribe a beta blocker, but she has reaction to Bystolic.  Would consider calcium channel blocker if need be. 3. LexiScan Myoview 4. Close followup in roughly 2-3 weeks  Did explain to her that she would have any worsening of symptoms or that occurs at rest, she should go directly to the emergency room.  It occurs with sitting walking across the room, I want her to contact me first so we can proceed directly to cardiac catheterization for class 3-4 unstable angina

## 2013-06-01 NOTE — Assessment & Plan Note (Signed)
I think the leg numbness and aching she's feeling mostly on her left leg he dated her anginal symptoms.  While we are evaluating her cardiac symptoms on an look into her legs with a artery Dopplers and ABIs.  This way once we get her angina taking care of, we can then address the claudication in order to get her back out and active.

## 2013-06-01 NOTE — Assessment & Plan Note (Signed)
She has known CAD with a Cypher stent in the RCA.  She has had unstable angina in the past and the symptoms sound quite similar.  I have not added ACE inhibitor last time due to concerns for hypotension, as her blood pressure is well-controlled.  However I think now that she has these symptoms, we will probably will need to consider either using an ACE inhibitor or calcium channel blocker.

## 2013-06-01 NOTE — Assessment & Plan Note (Signed)
She is currently on atorvastatin 40 L daily.  Her last been checked by PCP but documented in Epic.  LDL is not quite at goal, but is close.  Would continue current regimen as it has improved.

## 2013-06-01 NOTE — Progress Notes (Signed)
PATIENTBisma Orozco MRN: [id number]  DOB: [birth year]   DOV:06/01/2013 PCP: Frazier Richards, PA-C  Clinic Note: Chief Complaint  Patient presents with  . Annual Exam    Feels tightening in the neck when she does anything strenuous, fatigue, shortness of breath-both on exertion and at rest, cramps in legs and toes, lightheadedness when standing or moving too fast.   HPI: Alexa Orozco is a 67 y.o. female with a PMH below who presents today for annual followup with several complaints.  He has a history of single vessel CAD with PCI to the RCA for unstable/crescendo angina back in 2007.  She also history of Raynaud's  Interval History: Today she presents with some relatively concerning symptoms overall feeling "tired quite a bit".  She says that at rest she is fine, however with any significant exertion she starts getting really short of breath.  This is happening with routine activity, after initially starting off with some more significant activity  Visit this began a couple weeks ago she noticed that when she strains having heavy other friend she started noticing a little bit of chest/throat fullness.  She described it as a fullness or tightness in throat that took her breath away.  She had to stop to catch her breath.  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  But now she is noticing that it occurs more frequently and more intensely with less exertion.  He is to do all kinds of activities including carrying bricks back and forth in the yard for landscaping.  He had no symptoms consistent with it.  Now when she does be sent routine activities she notes this bolus her chest and neck. Most the time she saw she tired and short of breath first. She denies any PND, orthopnea or edema to suggest heart failure symptoms.  She has noted occasional lightheadedness if she moves too quickly, and has had a few episodes of feeling her heart racing and  fluttering that lasts only a few seconds, not enough to cause any real dizziness or lightheadedness.    No TIA or amaurosis fugax symptoms.  No melena/hematochezia or hematuria.  She does note that her legs have been starting to feel somewhat heavy of late with any type of activity as well.  Prior to her starting on the discomfort in her chest and throat, she would note that her legs are bothering her before they used to and that she would have to stop sometimes.  Past Medical History  Diagnosis Date  . Cataract     h/o bilat repair  . Asthma   . Depression   . Emphysema of lung   . Allergy   . Hypothyroidism   . CAD S/P percutaneous coronary angioplasty     s/p Cypher DES to RCA  . History of palpitations   . GERD (gastroesophageal reflux disease)   . Dyslipidemia   . Raynaud disease   . History of tobacco abuse   . Hypertension     well-controlled  . History of nuclear stress test 03/17/2011    dipyridamole; normal pattern of perfusion; normal, low risk study    Prior Cardiac Evaluation and Past Surgical History: Past Surgical History  Procedure Laterality Date  . Abdominal hysterectomy      h/o partila hysterectomy  . Tonsillectomy  1959  . Breast biopsy  1988  . Coronary angioplasty with stent placement  10/14/2005  cypher DES (3.82mmx18mm) to high grade RCA lesion  . Transthoracic echocardiogram  05/22/2009    EF=>55%, normal LV systolic function; normal RV systolic function; mild MR/TR    Allergies  Allergen Reactions  . Bystolic [Nebivolol Hcl] Hives  . Cephalexin Hives    Current Outpatient Prescriptions  Medication Sig Dispense Refill  . ALBUTEROL IN Inhale into the lungs. MDI uses prn       . aspirin 81 MG tablet Take 81 mg by mouth daily.        Marland Kitchen atorvastatin (LIPITOR) 40 MG tablet Take 1 tablet (40 mg total) by mouth daily.  90 tablet  0  . citalopram (CELEXA) 20 MG tablet Take 1 tablet (20 mg total) by mouth daily.  30 tablet  5  . estradiol (ESTRACE) 1  MG tablet Take 1 tablet (1 mg total) by mouth daily.  909 tablet  3  . levothyroxine (SYNTHROID, LEVOTHROID) 88 MCG tablet Take 88 mcg by mouth daily.        . pantoprazole (PROTONIX) 40 MG tablet Take 1 tablet (40 mg total) by mouth daily.  30 tablet  11  . tiotropium (SPIRIVA) 18 MCG inhalation capsule Place 1 capsule (18 mcg total) into inhaler and inhale daily.  30 capsule  5  . clopidogrel (PLAVIX) 75 MG tablet TAKE 1 TABLET BY MOUTH ONCE A DAY  90 tablet  1   No current facility-administered medications for this visit.    History   Social History Narrative   She is a widowed mother of 3, grandmother for, great-grandmother 1.  She still smokes about half pack a day.  She doesn't have about 35 years.  She does not drink.   Prior to her symptoms coming on, she used to do all kind of walking around and working in the garden and other activities with her close friend.   ROS: A comprehensive Review of Systems - Negative except Significant symptoms noted above.  PHYSICAL EXAM BP 130/82  Pulse 53  Ht 5\' 2"  (1.575 m)  Wt 177 lb 4.8 oz (80.423 kg)  BMI 32.42 kg/m2 General appearance: alert, cooperative, appears stated age, no distress and mildly obese Neck: no adenopathy, no carotid bruit and no JVD Lungs: clear to auscultation bilaterally, normal percussion bilaterally and non-labored Heart: regular rate and rhythm, S1, S2 normal, no murmur, click, rub or gallop; nondisplaced PMI  Abdomen: soft, non-tender; bowel sounds normal; no masses,  no organomegaly; mild truncal obesity Extremities: extremities normal, atraumatic, no cyanosis, and no edema Pulses: Trace to 1+ but palpable on DP and PT pulses.  Mostly symmetric, but somewhat more diminished on the left;  Neurologic: Mental status: Alert, oriented, thought content appropriate Cranial nerves: normal (II-XII grossly intact)  [id number] today: Yes Rate: 53 , Rhythm: Sinus bradycardia, otherwise normal ECG;    Recent Labs:  Reviewed on Epic from May 2014  ASSESSMENT / PLAN: Angina, class II She generally has symptoms that are concerning for angina, they're not occurring with much exertion but would qualify for a class II in class III angina at this point.  As of today she is not really on any antianginal medications.  She is on statin as well as aspirin Plavix or but not on a beta blocker (she's been intolerant of Bystolic in the past) no nitrate or ACE inhibitor.  Plan:  1. Prescribed Sublingual Nitroglycerin, Imdur 30 mg daily.   2. I would like to prescribe a beta blocker, but she has reaction to Bystolic.  Would consider calcium channel blocker if need be. 3. LexiScan Myoview 4. Close followup in roughly 2-3 weeks  Did explain to her that she would have any worsening of symptoms or that occurs at rest, she should go directly to the emergency room.  It occurs with sitting walking across the room, I want her to contact me first so we can proceed directly to cardiac catheterization for class 3-4 unstable angina   CAD S/P percutaneous coronary angioplasty She has known CAD with a Cypher stent in the RCA.  She has had unstable angina in the past and the symptoms sound quite similar.  I have not added ACE inhibitor last time due to concerns for hypotension, as her blood pressure is well-controlled.  However I think now that she has these symptoms, we will probably will need to consider either using an ACE inhibitor or calcium channel blocker.  HTN (hypertension) Her blood pressures actually well-controlled.  Please see above for thoughts  Dyslipidemia She is currently on atorvastatin 40 L daily.  Her last been checked by PCP but documented in Epic.  LDL is not quite at goal, but is close.  Would continue current regimen as it has improved.  Claudication I think the leg numbness and aching she's feeling mostly on her left leg he dated her anginal symptoms.  While we are evaluating her cardiac symptoms on an look  into her legs with a artery Dopplers and ABIs.  This way once we get her angina taking care of, we can then address the claudication in order to get her back out and active.  Tobacco abuse I spent at least 5 minutes talking to her about how important is her stop smoking.  The coronary disease and also claudication are all related to smoking.  I gave her options of Nicoderm patches, electronic cigarettes, Chantix.  I related information about [place] Quit-Line.  She seem to listen, but was still contemplating whether she wants to go down this Road. Hopefully the results of his upcoming studies will help boost my argument.  Obesity (BMI 30-39.9) She is mildly obese, which is no more risk factor for her.  She needs to get back out and exercise, therefore we need to get her symptomatically improved.  She also is to monitor dietary intake.  We can discuss this further in future followup visits.   Orders Placed This Encounter  Procedures  . Myocardial Perfusion Imaging    Standing Status: Future     Number of Occurrences:      Standing Expiration Date: 05/31/2014    Order Specific Question:  Type of stress    Answer:  Lexiscan    Order Specific Question:  Patient weight in lbs    Answer:  177  . EKG 12-Lead  . Lower Extremity Arterial Duplex    CLAUDICATION    Standing Status: Future     Number of Occurrences:      Standing Expiration Date: 05/31/2014    Order Specific Question:  Where should this test be performed:    Answer:  [facility]   Meds ordered this encounter  Medications  . nitroGLYCERIN (NITROSTAT) 0.4 MG SL tablet    Sig: Place 1 tablet (0.4 mg total) under the tongue every 5 (five) minutes as needed for chest pain.    Dispense:  25 tablet    Refill:  6  . isosorbide mononitrate (IMDUR) 30 MG 24 hr tablet    Sig: Take 1 tablet (30 mg total) by  mouth daily.    Dispense:  30 tablet    Refill:  11   Followup: 2-3 weeks, following stress test  Levii Hairfield W. Herbie Baltimore, M.D.,  M.S. [facility] [address] [place], [place]  [address]  [phone] Pager # [phone]

## 2013-06-01 NOTE — Assessment & Plan Note (Signed)
She is mildly obese, which is no more risk factor for her.  She needs to get back out and exercise, therefore we need to get her symptomatically improved.  She also is to monitor dietary intake.  We can discuss this further in future followup visits.

## 2013-06-06 ENCOUNTER — Ambulatory Visit (HOSPITAL_COMMUNITY)
Admission: RE | Admit: 2013-06-06 | Discharge: 2013-06-06 | Disposition: A | Discharge status: Home / Self Care | Payer: Medicare Other | Source: Ambulatory Visit | Attending: Cardiovascular Disease | Admitting: Cardiovascular Disease

## 2013-06-06 DIAGNOSIS — I209 Angina pectoris, unspecified: Secondary | ICD-10-CM | POA: Diagnosis not present

## 2013-06-06 DIAGNOSIS — Z9861 Coronary angioplasty status: Secondary | ICD-10-CM | POA: Diagnosis not present

## 2013-06-06 DIAGNOSIS — E785 Hyperlipidemia, unspecified: Secondary | ICD-10-CM | POA: Insufficient documentation

## 2013-06-06 DIAGNOSIS — Z720 Tobacco use: Secondary | ICD-10-CM

## 2013-06-06 DIAGNOSIS — F17200 Nicotine dependence, unspecified, uncomplicated: Secondary | ICD-10-CM | POA: Insufficient documentation

## 2013-06-06 DIAGNOSIS — E669 Obesity, unspecified: Secondary | ICD-10-CM | POA: Insufficient documentation

## 2013-06-06 DIAGNOSIS — I2510 Atherosclerotic heart disease of native coronary artery without angina pectoris: Secondary | ICD-10-CM

## 2013-06-06 MED ORDER — TECHNETIUM TC 99M SESTAMIBI GENERIC - CARDIOLITE
32.0000 | Freq: Once | INTRAVENOUS | Status: AC | PRN
  Administered 2013-06-06: 32 via INTRAVENOUS

## 2013-06-06 MED ORDER — AMINOPHYLLINE 25 MG/ML IV SOLN
75.0000 mg | Freq: Once | INTRAVENOUS | Status: AC
  Administered 2013-06-06: 75 mg via INTRAVENOUS

## 2013-06-06 MED ORDER — REGADENOSON 0.4 MG/5ML IV SOLN
0.4000 mg | Freq: Once | INTRAVENOUS | Status: AC
  Administered 2013-06-06: 0.4 mg via INTRAVENOUS

## 2013-06-06 MED ORDER — TECHNETIUM TC 99M SESTAMIBI GENERIC - CARDIOLITE
10.9000 | Freq: Once | INTRAVENOUS | Status: AC | PRN
  Administered 2013-06-06: 10.9 via INTRAVENOUS

## 2013-06-06 NOTE — Procedures (Addendum)
[facility] [address] [place] [address] [phone]  Cardiology Nuclear Med Study  Alexa Orozco is a 67 y.o. female     MRN : [id number]     DOB: 02-17-1946  Procedure Date: 06/06/2013  Nuclear Med Background Indication for Stress Test:  Stent Patency History:  COPD and CAD;STENT/PTCA--10/14/2005 Cardiac Risk Factors: Family History - CAD, Hypertension, Lipids, Obesity, PVD and Smoker  Symptoms:  DOE, Fatigue, Light-Headedness, Palpitations, SOB and Neck tightness with exertion   Nuclear Pre-Procedure Caffeine/Decaff Intake:  12:00am NPO After: 10 AM   IV Site: R Antecubital  IV 0.9% NS with Angio Cath:  22g  Chest Size (in):  n/a IV Started by: Emmit Pomfret, RN  Height: 5\' 2"  (1.575 m)  Cup Size: C  BMI:  Body mass index is 32.37 kg/(m^2). Weight:  177 lb (80.287 kg)   Tech Comments:  n/a    Nuclear Med Study 1 or 2 day study: 1 day  Stress Test Type:  Lexiscan  Order Authorizing Provider:  Bryan Lemma, MD   Resting Radionuclide: Technetium 25m Sestamibi  Resting Radionuclide Dose: 10.9 mCi   Stress Radionuclide:  Technetium 69m Sestamibi  Stress Radionuclide Dose: 32.0 mCi           Stress Protocol Rest HR: 54 Stress HR: 75  Rest BP: 129/81 Stress BP: 155/83  Exercise Time (min): n/a METS: n/a   Predicted Max HR: 155 bpm % Max HR: 48.39 bpm Rate Pressure Product: 21308  Dose of Adenosine (mg):  n/a Dose of Lexiscan: 0.4 mg  Dose of Atropine (mg): n/a Dose of Dobutamine: n/a mcg/kg/min (at max HR)  Stress Test Technologist: Esperanza Sheets, CCT Nuclear Technologist: Gonzella Lex, CNMT   Rest Procedure:  Myocardial perfusion imaging was performed at rest 45 minutes following the intravenous administration of Technetium 58m Sestamibi. Stress Procedure:  The patient received IV Lexiscan 0.4 mg over 15-seconds.  Technetium 29m Sestamibi injected at 30-seconds.  The patient experienced SOB and chest  tightness; 75 mg of IV Aminophylline was administered with resolution of symptoms.  There were no significant changes with Lexiscan.  Quantitative spect images were obtained after a 45 minute delay.  Transient Ischemic Dilatation (Normal <1.22):  1.17 Lung/Heart Ratio (Normal <0.45):  0.24 QGS EDV:  68 ml QGS ESV:  16 ml LV Ejection Fraction: 76%  Signed by  Gonzella Lex, CNMT  PHYSICIAN INTERPRETATION  Rest ECG: NSR with non-specific ST-T wave changes  Stress ECG: No significant change from baseline ECG and No significant ST segment change suggestive of ischemia.  QPS Raw Data Images:  There is significant tracer uptake in the infra-diaphragmatic splanchnic viscera that does partially obscure the inferior LV wall, making the study technically difficult to interpret.   Stress Images:  There is mild apical thinning with normal uptake in other regions. Rest Images:  There is mild apical thinning with normal uptake in other regions. Subtraction (SDS):  There is a fixed anteroapical defect that is most consistent with breast attenuation.  Impression Exercise Capacity:  Lexiscan with no exercise. BP Response:  Normal blood pressure response. Clinical Symptoms:  Chest tightness & Dyspnea ECG Impression:  No significant ECG changes with Lexiscan. LV Wall Motion:  NL LV Function; NL Wall Motion  Comparison with Prior Nuclear Study: No significant change from previous study - the breast attenuation is more prominent.  Overall Impression:  Normal stress nuclear study. and Low risk stress nuclear study with mild anteroapical breast  attenuation.Marland Kitchen    Marykay Lex, MD  06/06/2013 5:25 PM

## 2013-06-08 DIAGNOSIS — Z23 Encounter for immunization: Secondary | ICD-10-CM | POA: Diagnosis not present

## 2013-06-13 ENCOUNTER — Ambulatory Visit (HOSPITAL_COMMUNITY)
Admission: RE | Admit: 2013-06-13 | Discharge: 2013-06-13 | Disposition: A | Discharge status: Home / Self Care | Payer: Medicare Other | Source: Ambulatory Visit | Attending: Cardiology | Admitting: Cardiology

## 2013-06-13 DIAGNOSIS — I739 Peripheral vascular disease, unspecified: Secondary | ICD-10-CM | POA: Insufficient documentation

## 2013-06-13 NOTE — Progress Notes (Signed)
Lower Extremity Arterial Duplex Completed. °Brianna L Mazza,RVT °

## 2013-06-20 ENCOUNTER — Encounter: Payer: Self-pay | Admitting: Cardiology

## 2013-06-22 ENCOUNTER — Ambulatory Visit: Payer: Medicare Other | Admitting: Cardiology

## 2013-06-26 ENCOUNTER — Encounter: Payer: Self-pay | Admitting: Cardiovascular Disease

## 2013-06-29 ENCOUNTER — Telehealth: Payer: Self-pay | Admitting: *Deleted

## 2013-06-29 NOTE — Telephone Encounter (Signed)
Left message on voicemail concerning LE doppler ; appointment 07/2013

## 2013-06-29 NOTE — Telephone Encounter (Signed)
Message copied by Tobin Chad on Thu Jun 29, 2013  4:05 PM ------      Message from: Herbie Baltimore, DAVID W      Created: Fri Jun 23, 2013  1:18 PM       Normal LE dopplers!! No sign of blockages.            Marykay Lex, MD       ------

## 2013-07-07 ENCOUNTER — Ambulatory Visit (INDEPENDENT_AMBULATORY_CARE_PROVIDER_SITE_OTHER): Payer: Medicare Other | Admitting: Cardiology

## 2013-07-07 ENCOUNTER — Encounter: Payer: Self-pay | Admitting: Cardiology

## 2013-07-07 VITALS — BP 120/70 | HR 56 | Ht 62.0 in | Wt 179.9 lb

## 2013-07-07 DIAGNOSIS — E785 Hyperlipidemia, unspecified: Secondary | ICD-10-CM | POA: Diagnosis not present

## 2013-07-07 DIAGNOSIS — I739 Peripheral vascular disease, unspecified: Secondary | ICD-10-CM | POA: Diagnosis not present

## 2013-07-07 DIAGNOSIS — E669 Obesity, unspecified: Secondary | ICD-10-CM | POA: Diagnosis not present

## 2013-07-07 DIAGNOSIS — Z9861 Coronary angioplasty status: Secondary | ICD-10-CM | POA: Diagnosis not present

## 2013-07-07 DIAGNOSIS — I2510 Atherosclerotic heart disease of native coronary artery without angina pectoris: Secondary | ICD-10-CM | POA: Diagnosis not present

## 2013-07-07 NOTE — Patient Instructions (Signed)
Your physician wants you to follow-up in 12 months Dr Herbie Baltimore. You will receive a reminder letter in the mail two months in advance. If you don't receive a letter, please call our office to schedule the follow-up appointment.  Your physician discussed the importance of regular exercise and recommended that you start or continue a regular exercise program for good health.  WATCH FOOD INTAKE

## 2013-07-07 NOTE — Progress Notes (Signed)
PATIENTCloma Orozco MRN: [id number]  DOB: 11-25-1945   DOV:07/09/2013 PCP: Frazier Richards, PA-C  Clinic Note: Chief Complaint  Patient presents with  . Follow-up    Stress test. C/o shortness of breath, swelling of feet, and lightheadedness. Stay sleepy and tired a lot.    HPI: Alexa Orozco is a 67 y.o. female with a PMH below who presents today for one-month followup after closure we Dopplers a nuclear stress test was performed. I saw her back in the beginning of October for fatigue and tiredness. Also tightening in the neck with the strenuous activity. He has no dyspnea on exertion. This is also associated with leg pain with possible claudication. She has had a nuclear stress test in the artery Dopplers performed but which were relatively normal.  Interval History: She presents today, still having shortness of breath with exertion. She does not have cut down her smoking to about half a pack a day, he continues to try. Fortunately she gained about 30 pounds the past year. She simply just not reactive and is not been exercising enough to keep herself in shape. She was quite relieved to find out the nuclear stress test was negative for ischemia. She also doesn't note the fullness in the throat and chest as much she had. She denies any significant chest tightness or pressure with exertion currently. He does note some swelling in her feet and lightheadedness as noted above. Still has fatigue, stating that she feels tired all day.  The remainder of Cardiovascular ROS: positive for - dyspnea on exertion negative for - chest pain, edema, irregular heartbeat, loss of consciousness, murmur, orthopnea, palpitations, paroxysmal nocturnal dyspnea, rapid heart rate or shortness of breath: Additional cardiac review of systems: Lightheadedness - yes, dizziness - yes, syncope/near-syncope - no; TIA/amaurosis fugax - no Melena - no, hematochezia no; hematuria - no; nosebleeds - no; claudication - no  Past Medical  History  Diagnosis Date  . Cataract     h/o bilat repair  . Asthma   . Depression   . Emphysema of lung   . Allergy   . Hypothyroidism   . CAD S/P percutaneous coronary angioplasty     s/p Cypher DES to RCA  . History of palpitations   . GERD (gastroesophageal reflux disease)   . Dyslipidemia   . Raynaud disease   . History of tobacco abuse   . Hypertension     well-controlled  . History of nuclear stress test 03/17/2011;; 06/06/2013    '12: dipyridamole; Normal, low risk study; 2014 - normal stress test: LOW RISK; mild anteroapical breast attenuation    Prior Cardiac Evaluation and Past Surgical History: Past Surgical History  Procedure Laterality Date  . Abdominal hysterectomy      h/o partila hysterectomy  . Tonsillectomy  1959  . Breast biopsy  1988  . Coronary angioplasty with stent placement  10/14/2005    cypher DES (3.34mmx18mm) to high grade RCA lesion  . Transthoracic echocardiogram  05/22/2009    EF=>55%, normal LV systolic function; normal RV systolic function; mild MR/TR   Allergies  Allergen Reactions  . Bystolic [Nebivolol Hcl] Hives  . Cephalexin Hives    Current Outpatient Prescriptions  Medication Sig Dispense Refill  . ALBUTEROL IN Inhale into the lungs. MDI uses prn       . aspirin 81 MG tablet Take 81 mg by mouth daily.        Marland Kitchen atorvastatin (LIPITOR) 40 MG tablet Take 1 tablet (40 mg total) by  mouth daily.  90 tablet  0  . citalopram (CELEXA) 20 MG tablet Take 1 tablet (20 mg total) by mouth daily.  30 tablet  5  . clopidogrel (PLAVIX) 75 MG tablet TAKE 1 TABLET BY MOUTH ONCE A DAY  90 tablet  1  . estradiol (ESTRACE) 1 MG tablet Take 1 tablet (1 mg total) by mouth daily.  909 tablet  3  . isosorbide mononitrate (IMDUR) 30 MG 24 hr tablet Take 1 tablet (30 mg total) by mouth daily.  30 tablet  11  . levothyroxine (SYNTHROID, LEVOTHROID) 88 MCG tablet Take 88 mcg by mouth daily.        . nitroGLYCERIN (NITROSTAT) 0.4 MG SL tablet Place 1 tablet (0.4  mg total) under the tongue every 5 (five) minutes as needed for chest pain.  25 tablet  6  . pantoprazole (PROTONIX) 40 MG tablet Take 1 tablet (40 mg total) by mouth daily.  30 tablet  11  . tiotropium (SPIRIVA) 18 MCG inhalation capsule Place 1 capsule (18 mcg total) into inhaler and inhale daily.  30 capsule  5   No current facility-administered medications for this visit.   History   Social History Narrative   She is a widowed mother of 3, grandmother for, great-grandmother 1.  She still smokes about half pack a day.  She doesn't have about 35 years.  She does not drink.   Prior to her symptoms coming on, she used to do all kind of walking around and working in the garden and other activities with her close friend.   ROS: A comprehensive Review of Systems - Negative except If not noted above  PHYSICAL EXAM BP 120/70  Pulse 56  Ht 5\' 2"  (1.575 m)  Wt 179 lb 14.4 oz (81.602 kg)  BMI 32.90 kg/m2 General appearance: alert, cooperative, appears stated age, no distress and mildly obese  Neck: no LAN, no carotid bruit and no JVD  Lungs: CTAB., normal percussion bilaterally and non-labored  Heart: RRR, S1, S2 normal, no murmur, click, rub or gallop; nondisplaced PMI  Abdomen: soft, non-tender; bowel sounds normal; no masses, no organomegaly; mild truncal obesity  Extremities: extremities normal, atraumatic, no cyanosis, and no edema  Pulses: Trace to 1+ but palpable on DP and PT pulses. Mostly symmetric, but somewhat more diminished on the left;  Neurologic: Mental status: Alert, oriented, thought content appropriate  Cranial nerves: normal (II-XII grossly intact)  [id number] today: No  Recent Labs: no new labs.  ASSESSMENT / PLAN: Followup cardiac evaluation with a relatively normal studies. No significant changes made.  CAD S/P percutaneous coronary angioplasty No evidence of ischemia on nuclear stress test. I'm not really sure what her chest discomfort was from, probably  musculoskeletal in nature. Continue with nitrate and dual antiplatelet therapy along with statin. Has had bradycardia with beta blockers, and blood pressure is relatively stable with no ACE inhibitor.  Claudication of left lower extremity Probably not macrovascular for medication simply based on lack of evidence of increased flow velocities on Doppler ultrasound. Treatment for moderate PAD would speak the same as for CAD with statin and dual antiplatelet.  Dyslipidemia Monitored by PCP. On statin. Last check her cholesterol was in May. LDL is not quite at goal, but continues to be relatively well-controlled.  Obesity (BMI 30-39.9) Her increasing weight could partly be due to her lack of activity, but may warrant additional evaluation by her primary physician considering the rapid nature of her weight gain. I talked about the  importance of dietary modification in getting back to exercising.     No orders of the defined types were placed in this encounter.   No orders of the defined types were placed in this encounter.    Followup: One year  Shakoya Gilmore W. Herbie Baltimore, M.D., M.S. [facility] [address] [place], [place]  [address]  [phone] Pager # [phone]

## 2013-07-09 NOTE — Assessment & Plan Note (Signed)
Monitored by PCP. On statin. Last check her cholesterol was in May. LDL is not quite at goal, but continues to be relatively well-controlled.

## 2013-07-09 NOTE — Assessment & Plan Note (Signed)
Her increasing weight could partly be due to her lack of activity, but may warrant additional evaluation by her primary physician considering the rapid nature of her weight gain. I talked about the importance of dietary modification in getting back to exercising.

## 2013-07-09 NOTE — Assessment & Plan Note (Signed)
No evidence of ischemia on nuclear stress test. I'm not really sure what her chest discomfort was from, probably musculoskeletal in nature. Continue with nitrate and dual antiplatelet therapy along with statin. Has had bradycardia with beta blockers, and blood pressure is relatively stable with no ACE inhibitor.

## 2013-07-09 NOTE — Assessment & Plan Note (Signed)
Probably not macrovascular for medication simply based on lack of evidence of increased flow velocities on Doppler ultrasound. Treatment for moderate PAD would speak the same as for CAD with statin and dual antiplatelet.

## 2013-07-30 ENCOUNTER — Other Ambulatory Visit: Payer: Self-pay | Admitting: Family Medicine

## 2013-08-01 ENCOUNTER — Other Ambulatory Visit: Payer: Self-pay | Admitting: Cardiovascular Disease

## 2013-08-01 NOTE — Telephone Encounter (Signed)
Rx was sent to pharmacy electronically. 

## 2013-08-04 ENCOUNTER — Encounter: Payer: Self-pay | Admitting: Family Medicine

## 2013-09-26 ENCOUNTER — Telehealth: Payer: Self-pay | Admitting: Family Medicine

## 2013-09-26 NOTE — Telephone Encounter (Signed)
Rec'd High Risk medication report from [facility] in reference to patient use of Estradiol.  Have tried to contact patient to make an appointment.  As of today have had no response form patient.

## 2013-11-06 ENCOUNTER — Encounter: Payer: Self-pay | Admitting: Family Medicine

## 2013-11-07 ENCOUNTER — Other Ambulatory Visit: Payer: Self-pay | Admitting: Physician Assistant

## 2013-11-07 DIAGNOSIS — E039 Hypothyroidism, unspecified: Secondary | ICD-10-CM

## 2013-11-07 NOTE — Telephone Encounter (Signed)
One refill given  CPE 11/29/13

## 2013-11-08 ENCOUNTER — Telehealth: Payer: Self-pay | Admitting: Family Medicine

## 2013-11-08 NOTE — Telephone Encounter (Signed)
Received medication alert from [facility].  It states concern over patient use of Spiriva.  They do not see where patient is refilling their medications and are concerned if she is taking properly.  Correspondence was given to provider.  Provider wants to call patient and check on her compliance with Spiriva.

## 2013-11-10 NOTE — Telephone Encounter (Signed)
Spoke to patient about Spiriva.  She says doesn't take everyday due to expense of medication.  States though her breathing has been getting worse and she will discuss with provider at CPE on 11/29/13.  I told her to call her insurance to see if there was an equivalent medication that was less expensive that could be substituted.

## 2013-11-27 ENCOUNTER — Other Ambulatory Visit: Payer: Medicare Other

## 2013-11-27 DIAGNOSIS — R0989 Other specified symptoms and signs involving the circulatory and respiratory systems: Secondary | ICD-10-CM | POA: Diagnosis not present

## 2013-11-27 DIAGNOSIS — E039 Hypothyroidism, unspecified: Secondary | ICD-10-CM | POA: Diagnosis not present

## 2013-11-27 DIAGNOSIS — R0609 Other forms of dyspnea: Secondary | ICD-10-CM | POA: Diagnosis not present

## 2013-11-27 DIAGNOSIS — E785 Hyperlipidemia, unspecified: Secondary | ICD-10-CM | POA: Diagnosis not present

## 2013-11-27 DIAGNOSIS — R0600 Dyspnea, unspecified: Secondary | ICD-10-CM

## 2013-11-27 DIAGNOSIS — Z0000 Encounter for general adult medical examination without abnormal findings: Secondary | ICD-10-CM | POA: Diagnosis not present

## 2013-11-27 LAB — CBC WITH DIFFERENTIAL/PLATELET
BASOS ABS: 0 10*3/uL (ref 0.0–0.1)
BASOS PCT: 0 % (ref 0–1)
EOS ABS: 0.3 10*3/uL (ref 0.0–0.7)
Eosinophils Relative: 4 % (ref 0–5)
HEMATOCRIT: 39.5 % (ref 36.0–46.0)
Hemoglobin: 13.5 g/dL (ref 12.0–15.0)
Lymphocytes Relative: 38 % (ref 12–46)
Lymphs Abs: 2.8 10*3/uL (ref 0.7–4.0)
MCH: 32.1 pg (ref 26.0–34.0)
MCHC: 34.2 g/dL (ref 30.0–36.0)
MCV: 93.8 fL (ref 78.0–100.0)
MONO ABS: 0.5 10*3/uL (ref 0.1–1.0)
Monocytes Relative: 7 % (ref 3–12)
NEUTROS ABS: 3.7 10*3/uL (ref 1.7–7.7)
Neutrophils Relative %: 51 % (ref 43–77)
Platelets: 297 10*3/uL (ref 150–400)
RBC: 4.21 MIL/uL (ref 3.87–5.11)
RDW: 14 % (ref 11.5–15.5)
WBC: 7.3 10*3/uL (ref 4.0–10.5)

## 2013-11-27 LAB — COMPLETE METABOLIC PANEL WITH GFR
ALK PHOS: 81 U/L (ref 39–117)
ALT: 24 U/L (ref 0–35)
AST: 24 U/L (ref 0–37)
Albumin: 3.9 g/dL (ref 3.5–5.2)
BILIRUBIN TOTAL: 0.4 mg/dL (ref 0.2–1.2)
BUN: 16 mg/dL (ref 6–23)
CO2: 27 mEq/L (ref 19–32)
CREATININE: 0.88 mg/dL (ref 0.50–1.10)
Calcium: 9.2 mg/dL (ref 8.4–10.5)
Chloride: 104 mEq/L (ref 96–112)
GFR, EST AFRICAN AMERICAN: 80 mL/min
GFR, Est Non African American: 69 mL/min
GLUCOSE: 83 mg/dL (ref 70–99)
Potassium: 4.5 mEq/L (ref 3.5–5.3)
SODIUM: 138 meq/L (ref 135–145)
TOTAL PROTEIN: 6.9 g/dL (ref 6.0–8.3)

## 2013-11-27 LAB — LIPID PANEL
CHOLESTEROL: 158 mg/dL (ref 0–200)
HDL: 70 mg/dL (ref >39)
LDL Cholesterol: 73 mg/dL (ref 0–99)
Total CHOL/HDL Ratio: 2.3 Ratio
Triglycerides: 75 mg/dL (ref <150)
VLDL: 15 mg/dL (ref 0–40)

## 2013-11-27 LAB — TSH: TSH: 32.163 u[IU]/mL — ABNORMAL HIGH (ref 0.350–4.500)

## 2013-11-29 ENCOUNTER — Encounter: Payer: Self-pay | Admitting: Family Medicine

## 2013-11-29 ENCOUNTER — Ambulatory Visit (INDEPENDENT_AMBULATORY_CARE_PROVIDER_SITE_OTHER): Payer: Medicare Other | Admitting: Physician Assistant

## 2013-11-29 ENCOUNTER — Encounter: Payer: Self-pay | Admitting: Physician Assistant

## 2013-11-29 VITALS — BP 126/70 | HR 60 | Temp 97.5°F | Resp 18 | Ht 60.75 in | Wt 180.0 lb

## 2013-11-29 DIAGNOSIS — F329 Major depressive disorder, single episode, unspecified: Secondary | ICD-10-CM

## 2013-11-29 DIAGNOSIS — F3289 Other specified depressive episodes: Secondary | ICD-10-CM

## 2013-11-29 DIAGNOSIS — F17200 Nicotine dependence, unspecified, uncomplicated: Secondary | ICD-10-CM

## 2013-11-29 DIAGNOSIS — I2510 Atherosclerotic heart disease of native coronary artery without angina pectoris: Secondary | ICD-10-CM

## 2013-11-29 DIAGNOSIS — E039 Hypothyroidism, unspecified: Secondary | ICD-10-CM

## 2013-11-29 DIAGNOSIS — F32A Depression, unspecified: Secondary | ICD-10-CM

## 2013-11-29 DIAGNOSIS — I7300 Raynaud's syndrome without gangrene: Secondary | ICD-10-CM

## 2013-11-29 DIAGNOSIS — Z7189 Other specified counseling: Secondary | ICD-10-CM

## 2013-11-29 DIAGNOSIS — E669 Obesity, unspecified: Secondary | ICD-10-CM

## 2013-11-29 DIAGNOSIS — M810 Age-related osteoporosis without current pathological fracture: Secondary | ICD-10-CM

## 2013-11-29 DIAGNOSIS — Z23 Encounter for immunization: Secondary | ICD-10-CM

## 2013-11-29 DIAGNOSIS — Z0000 Encounter for general adult medical examination without abnormal findings: Secondary | ICD-10-CM

## 2013-11-29 DIAGNOSIS — J449 Chronic obstructive pulmonary disease, unspecified: Secondary | ICD-10-CM

## 2013-11-29 DIAGNOSIS — E785 Hyperlipidemia, unspecified: Secondary | ICD-10-CM

## 2013-11-29 DIAGNOSIS — I10 Essential (primary) hypertension: Secondary | ICD-10-CM

## 2013-11-29 DIAGNOSIS — Z720 Tobacco use: Secondary | ICD-10-CM

## 2013-11-29 DIAGNOSIS — Z9861 Coronary angioplasty status: Secondary | ICD-10-CM

## 2013-11-29 DIAGNOSIS — Z716 Tobacco abuse counseling: Secondary | ICD-10-CM

## 2013-11-29 HISTORY — DX: Depression, unspecified: F32.A

## 2013-11-29 MED ORDER — ALENDRONATE SODIUM 70 MG PO TABS: 70.0000 mg | ORAL_TABLET | ORAL | Status: DC

## 2013-11-29 MED ORDER — BUPROPION HCL ER (SR) 150 MG PO TB12: 150.0000 mg | ORAL_TABLET | Freq: Two times a day (BID) | ORAL | Status: DC

## 2013-11-29 MED ORDER — LEVOTHYROXINE SODIUM 125 MCG PO TABS: 125.0000 ug | ORAL_TABLET | Freq: Every day | ORAL | Status: DC

## 2013-11-29 NOTE — Progress Notes (Signed)
Patient ID: Alexa Orozco MRN: [id number], DOB: 1945/10/30, 68 y.o. Date of Encounter: 11/29/2013,   Chief Complaint: Physical (CPE)  HPI: 68 y.o. y/o female  here for CPE.   Today she is requesting that I give her a note that says that she is stable to start an exercise program. Says she knows of a specific place where she can go for "Seniors" exercise but has to have a note from a medical provider stating that she is medically stable.  Still smoking about one half pack per day.  She does want to quit. Says that in the past she used the patch but had allergic reaction to this. Says that in the past Chantix caused vomiting. She is agreeable to try Wellbutrin. Says that she has never used this in the past.  Regarding her COPD, she states that she is using Spiriva. She quit the Symbicort secondary to cost. States that both the Spiriva and the Symbicort are expensive and are especially expensive when trying to pay for both of these.  He sees Dr. Ellyn Hack at [facility] routinely. Last visit there was just 07/07/13   Review of Systems: Consitutional: No fever, chills, fatigue, night sweats, lymphadenopathy. Eyes: No visual changes, eye redness, or discharge. ENT/Mouth: No ear pain, sore throat, nasal drainage, or sinus pain. Cardiovascular: No chest pressure,heaviness, tightness or squeezing, even with exertion. No increased shortness of breath or dyspnea on exertion.No palpitations, edema, orthopnea, PND. Respiratory: No cough, hemoptysis, SOB, or wheezing. Gastrointestinal: No anorexia, dysphagia, reflux, pain, nausea, vomiting, hematemesis, diarrhea, constipation, BRBPR, or melena. Breast: No mass, nodules, bulging, or retraction. No skin changes or inflammation. No nipple discharge. No lymphadenopathy. Genitourinary: No dysuria, hematuria, incontinence, vaginal discharge, pruritis, burning, abnormal bleeding, or pain. Musculoskeletal: No decreased ROM, No joint pain or swelling. No  significant pain in neck, back, or extremities. Skin: No rash, pruritis, or concerning lesions. Neurological: No headache, dizziness, syncope, seizures, tremors, memory loss, coordination problems, or paresthesias. Psychological: No anxiety, depression, hallucinations, SI/HI. Endocrine: No polydipsia, polyphagia, polyuria, or known diabetes. No palpitations/rapid heart rate.  All other systems were reviewed and are otherwise negative.  Past Medical History  Diagnosis Date  . Cataract     h/o bilat repair  . Asthma   . Depression   . Emphysema of lung   . Allergy   . Hypothyroidism   . CAD S/P percutaneous coronary angioplasty     s/p Cypher DES to RCA  . History of palpitations   . GERD (gastroesophageal reflux disease)   . Dyslipidemia   . Raynaud disease   . History of tobacco abuse   . Hypertension     well-controlled  . History of nuclear stress test 03/17/2011;; 06/06/2013    '12: dipyridamole; Normal, low risk study; 2014 - normal stress test: LOW RISK; mild anteroapical breast attenuation  . Osteoporosis   . Depression 11/29/2013     Past Surgical History  Procedure Laterality Date  . Abdominal hysterectomy      h/o partila hysterectomy  . Tonsillectomy  1959  . Breast biopsy  1988  . Coronary angioplasty with stent placement  10/14/2005    cypher DES (3.83mmx18mm) to high grade RCA lesion  . Transthoracic echocardiogram  05/22/2009    EF=>55%, normal LV systolic function; normal RV systolic function; mild MR/TR    Home Meds:  Current Outpatient Prescriptions on File Prior to Visit  Medication Sig Dispense Refill  . ALBUTEROL IN Inhale into the lungs. MDI uses prn       .  aspirin 81 MG tablet Take 81 mg by mouth daily.        Marland Kitchen atorvastatin (LIPITOR) 40 MG tablet TAKE 1 TABLET (40 MG TOTAL) BY MOUTH DAILY. DOSE CHANGE  90 tablet  3  . citalopram (CELEXA) 20 MG tablet TAKE 1 TABLET (20 MG TOTAL) BY MOUTH DAILY.  30 tablet  5  . clopidogrel (PLAVIX) 75 MG tablet TAKE 1  TABLET BY MOUTH ONCE A DAY  90 tablet  1  . estradiol (ESTRACE) 1 MG tablet Take 1 tablet (1 mg total) by mouth daily.  909 tablet  3  . nitroGLYCERIN (NITROSTAT) 0.4 MG SL tablet Place 1 tablet (0.4 mg total) under the tongue every 5 (five) minutes as needed for chest pain.  25 tablet  6  . pantoprazole (PROTONIX) 40 MG tablet Take 1 tablet (40 mg total) by mouth daily.  30 tablet  11  . tiotropium (SPIRIVA) 18 MCG inhalation capsule Place 1 capsule (18 mcg total) into inhaler and inhale daily.  30 capsule  5  . isosorbide mononitrate (IMDUR) 30 MG 24 hr tablet Take 1 tablet (30 mg total) by mouth daily.  30 tablet  11   No current facility-administered medications on file prior to visit.    Allergies:  Allergies  Allergen Reactions  . Bystolic [Nebivolol Hcl] Hives  . Cephalexin Hives    History   Social History  . Marital Status: Widowed    Spouse Name: N/A    Number of Children: 3  . Years of Education: 10th    Occupational History  . Retired    Social History Main Topics  . Smoking status: Current Every Day Smoker -- 0.50 packs/day for 35 years    Types: Cigarettes  . Smokeless tobacco: Never Used  . Alcohol Use: No  . Drug Use: No  . Sexual Activity: Not on file   Other Topics Concern  . Not on file   Social History Narrative   She is a widowed mother of 95, grandmother for, great-grandmother 30.  She still smokes about half pack a day.  She doesn't have about 35 years.  She does not drink.   Prior to her symptoms coming on, she used to do all kind of walking around and working in the garden and other activities with her close friend.    Family History  Problem Relation Age of Onset  . Liver cancer Mother   . Heart attack Mother   . Heart Problems Sister     Physical Exam: Blood pressure 126/70, pulse 60, temperature 97.5 F (36.4 C), temperature source Oral, resp. rate 18, height 5' 0.75" (1.543 m), weight 180 lb (81.647 kg), SpO2 97.00%., Body mass index is  34.29 kg/(m^2). General: Well developed, well nourished, WF Appears in no acute distress. HEENT: Normocephalic, atraumatic. Conjunctiva pink, sclera non-icteric. Pupils 2 mm constricting to 1 mm, round, regular, and equally reactive to light and accomodation. EOMI. Internal auditory canal clear. TMs with good cone of light and without pathology. Nasal mucosa pink. Nares are without discharge. No sinus tenderness. Oral mucosa pink.  Pharynx without exudate.   Neck: Supple. Trachea midline. No thyromegaly. Full ROM. No lymphadenopathy.No Carotid Bruits. Lungs: Clear to auscultation bilaterally without wheezes, rales, or rhonchi. Breathing is of normal effort and unlabored. Cardiovascular: RRR with S1 S2. No murmurs, rubs, or gallops. Distal pulses 2+ symmetrically. No carotid or abdominal bruits. Breast: Pt Defers. Abdomen: Soft, non-tender, non-distended with normoactive bowel sounds. No hepatosplenomegaly or masses. No rebound/guarding.  No CVA tenderness. No hernias.  Genitourinary: She defers pelvic exam. Musculoskeletal: Full range of motion and 5/5 strength throughout. Without swelling, atrophy, tenderness, crepitus, or warmth. Extremities without clubbing, cyanosis, or edema. Calves supple. Skin: Warm and moist without erythema, ecchymosis, wounds, or rash. Neuro: A+Ox3. CN II-XII grossly intact. Moves all extremities spontaneously. Full sensation throughout. Normal gait. DTR 2+ throughout upper and lower extremities. Finger to nose intact. Psych:  Responds to questions appropriately with a normal affect.   Assessment/Plan:  68 y.o. y/o female here for CPE 1. Visit for preventive health examination  A. Screening Labs: Patient came and had fasting labs 11/27/13.  TSH is significantly elevated at 32.163. I reviewed this with her. Also reviewed prior recent TSHs.  10 months ago TSH was 0.901.    11 months ago TSH was 1.959. I asked her  multiple times, but she repeatedly states that she knows  that she definitely is taking her thyroid pill daily and that this has not accidentally run out. As well she says that she has had significant weight gain just in the last few months and says that she has been  eating the same diet and doing the same amount of activity and exercise as always. Also says that she is having significant increased fatigue in the last few months. See # 7 below.  Will  increase dose and then get recheck TSH in 6 weeks.  CMET, FLP, CBC all normal. FLP at goal.  B. Pap: She has a history of partial hysterectomy. This was not performed secondary to any cancer. Therefore she has no indication for further Pap smears. However, I did discuss with her today that she still needs to have annual pelvic exams. However she defers. Aware of risk versus benefit but still defers.  C. Screening Mammogram: She states that she has had mammogram in the past 12 months and this was negative.  D. DEXA/BMD:  Last DEXA was performed 02/15/2012. She was informed that this showed osteoporosis in the hip and the spine. She was prescribed Fosamax 70 mg once a week to take on an empty stomach separate from other medicines. She reports that she took that for a couple of months but then stopped it. Says she had a hard time remembering to take it once a week. Says she just decided to stop it but no other good reason. Was having no adverse effects. Discussed need for medication and she is agreeable to restart this. Says that she does have a new type cellphone and can set alarm clock on this for once a week.  E. Colorectal Cancer Screening: I had discussed this at her visit here 11/2011.  At that time she reported that this was performed approximately 2010 at [facility] and it was normal.  However according to the health maintenance section of Epic, this was performed 08/31/2005. Repeat 2017.  F. Immunizations:  Influenza: N/A Tetanus:  Tdap given here 12/23/2011 Pneumococcal:  Pneumovax 23 was given  here 12/23/2011.   I discussed getting the Prevnar 13 today and she is agreeable to do so. Zostavax:  She received Zostavax 01/17/2013     2. Encounter for smoking cessation counseling - buPROPion (WELLBUTRIN SR) 150 MG 12 hr tablet; Take 1 tablet (150 mg total) by mouth 2 (two) times daily.  Dispense: 60 tablet; Refill: 3 She is informed to take this once daily for 5 days then increase to one twice daily. If this causes any adverse effects including change in mood or behaviors  in followup.  3. Tobacco abuse  4. COPD (chronic obstructive pulmonary disease) On Spiriva. Stopped Symbicort secondary to cost.  5. Osteoporosis See #1 --D above.  - alendronate (FOSAMAX) 70 MG tablet; Take 1 tablet (70 mg total) by mouth every 7 (seven) days. Take with a full glass of water on an empty stomach.  Dispense: 4 tablet; Refill: 11 Is to take calcium and vitamin D as directed. Plans to start an exercise program so weightbearing exercise will help with this as well.  6. HTN (hypertension) Law pressure is well-controlled and at goal. 7. Hypothyroid See # 1 --A above  - levothyroxine (SYNTHROID, LEVOTHROID) 125 MCG tablet; Take 1 tablet (125 mcg total) by mouth daily.  Dispense: 30 tablet; Refill: 1 - TSH; Future  8. Dyslipidemia Lipid panel is at goal. LFTs normal.  9. Obesity (BMI 30-39.9)  10. CAD S/P percutaneous coronary angioplasty Per Cardiology 11. Raynaud's disease  12. Depression Controlled with Celexa.   Return to  clinic to check TSH in 6 weeks. Return for regular office visit in 6 months or sooner if needed.  Also,  today we did give her a letter stating that she is medically stable to start an exercise program. Her last office visit with Dr. Ellyn Hack cardiology was 07/07/2013. Just prior to that she had a nuclear stress test which was negative for ischemia and was low risk.  Marin Olp [place], [place], [facility] 11/29/2013 3:01 PM

## 2013-12-29 ENCOUNTER — Other Ambulatory Visit: Payer: Self-pay

## 2013-12-29 MED ORDER — CLOPIDOGREL BISULFATE 75 MG PO TABS: ORAL_TABLET | ORAL | Status: DC

## 2014-01-09 ENCOUNTER — Other Ambulatory Visit: Payer: Medicare Other

## 2014-01-09 DIAGNOSIS — E039 Hypothyroidism, unspecified: Secondary | ICD-10-CM | POA: Diagnosis not present

## 2014-01-09 LAB — TSH: TSH: 0.563 u[IU]/mL (ref 0.350–4.500)

## 2014-01-22 ENCOUNTER — Other Ambulatory Visit: Payer: Self-pay | Admitting: Physician Assistant

## 2014-01-22 DIAGNOSIS — E039 Hypothyroidism, unspecified: Secondary | ICD-10-CM

## 2014-01-23 NOTE — Telephone Encounter (Signed)
Medication refilled per protocol. 

## 2014-01-24 ENCOUNTER — Other Ambulatory Visit: Payer: Self-pay | Admitting: Physician Assistant

## 2014-01-24 ENCOUNTER — Other Ambulatory Visit: Payer: Self-pay | Admitting: Family Medicine

## 2014-01-24 NOTE — Telephone Encounter (Signed)
Estrace and levothyroxine refilled.  Pt wants 90 day refill of Bupropion.  Just put on at last visit for smoking cessation.  Will get ok from provider.

## 2014-01-24 NOTE — Telephone Encounter (Signed)
This medicine was prescribed at office visit on 11/29/13 for smoking cessation. I prescribed #60+3 refills. Tell patient that usually when this is used for smoking cessation, patient just take it for several months until they  quit smoking and are no longer having any craving issues. Then they can go off of the medicine. Therefore I do not think a  90 day Rx is needed.

## 2014-01-24 NOTE — Telephone Encounter (Signed)
Medication refilled per protocol. 

## 2014-02-03 ENCOUNTER — Other Ambulatory Visit: Payer: Self-pay | Admitting: Physician Assistant

## 2014-02-05 NOTE — Telephone Encounter (Signed)
Just refilled 5/28 confirmed with pharmacy

## 2014-02-14 ENCOUNTER — Other Ambulatory Visit: Payer: Self-pay | Admitting: Physician Assistant

## 2014-02-14 DIAGNOSIS — E039 Hypothyroidism, unspecified: Secondary | ICD-10-CM

## 2014-02-14 NOTE — Telephone Encounter (Signed)
Medication refilled per protocol. 

## 2014-02-28 DIAGNOSIS — Z961 Presence of intraocular lens: Secondary | ICD-10-CM | POA: Diagnosis not present

## 2014-02-28 DIAGNOSIS — H01139 Eczematous dermatitis of unspecified eye, unspecified eyelid: Secondary | ICD-10-CM | POA: Diagnosis not present

## 2014-02-28 DIAGNOSIS — H04129 Dry eye syndrome of unspecified lacrimal gland: Secondary | ICD-10-CM | POA: Diagnosis not present

## 2014-02-28 DIAGNOSIS — H02839 Dermatochalasis of unspecified eye, unspecified eyelid: Secondary | ICD-10-CM | POA: Diagnosis not present

## 2014-02-28 DIAGNOSIS — H1045 Other chronic allergic conjunctivitis: Secondary | ICD-10-CM | POA: Diagnosis not present

## 2014-03-23 ENCOUNTER — Other Ambulatory Visit: Payer: Self-pay | Admitting: Physician Assistant

## 2014-03-23 NOTE — Telephone Encounter (Signed)
Refill appropriate and filled per protocol. 

## 2014-04-02 ENCOUNTER — Other Ambulatory Visit: Payer: Self-pay | Admitting: Physician Assistant

## 2014-04-02 NOTE — Telephone Encounter (Signed)
Refill appropriate and filled per protocol. 

## 2014-04-04 ENCOUNTER — Other Ambulatory Visit: Payer: Self-pay

## 2014-04-04 DIAGNOSIS — Z1231 Encounter for screening mammogram for malignant neoplasm of breast: Secondary | ICD-10-CM

## 2014-04-09 ENCOUNTER — Ambulatory Visit: Payer: Medicare Other

## 2014-04-17 ENCOUNTER — Encounter (INDEPENDENT_AMBULATORY_CARE_PROVIDER_SITE_OTHER): Payer: Self-pay

## 2014-04-17 ENCOUNTER — Ambulatory Visit
Admission: RE | Admit: 2014-04-17 | Discharge: 2014-04-17 | Disposition: A | Discharge status: Home / Self Care | Payer: Medicare Other | Source: Ambulatory Visit

## 2014-04-17 DIAGNOSIS — Z1231 Encounter for screening mammogram for malignant neoplasm of breast: Secondary | ICD-10-CM

## 2014-05-24 DIAGNOSIS — Z23 Encounter for immunization: Secondary | ICD-10-CM | POA: Diagnosis not present

## 2014-07-19 ENCOUNTER — Encounter: Payer: Self-pay | Admitting: Physician Assistant

## 2014-07-19 ENCOUNTER — Ambulatory Visit (INDEPENDENT_AMBULATORY_CARE_PROVIDER_SITE_OTHER): Payer: Medicare Other | Admitting: Physician Assistant

## 2014-07-19 VITALS — BP 122/80 | HR 64 | Temp 98.0°F | Resp 18 | Wt 176.0 lb

## 2014-07-19 DIAGNOSIS — M26629 Arthralgia of temporomandibular joint, unspecified side: Secondary | ICD-10-CM

## 2014-07-19 DIAGNOSIS — Z9861 Coronary angioplasty status: Secondary | ICD-10-CM

## 2014-07-19 DIAGNOSIS — M2662 Arthralgia of temporomandibular joint: Secondary | ICD-10-CM

## 2014-07-19 DIAGNOSIS — I2510 Atherosclerotic heart disease of native coronary artery without angina pectoris: Secondary | ICD-10-CM

## 2014-07-19 MED ORDER — MELOXICAM 7.5 MG PO TABS: 7.5000 mg | ORAL_TABLET | Freq: Every day | ORAL | Status: DC

## 2014-07-19 NOTE — Progress Notes (Signed)
Patient ID: Tearah Saulsbury MRN: [id number], DOB: 31-May-1946, 68 y.o. Date of Encounter: 07/19/2014, 2:33 PM    Chief Complaint:  Chief Complaint  Patient presents with  . c/o rt sided jaw pain    hard to open mouth, popping and ear pain     HPI: 68 y.o. year old white female says that her right jaw started popping a few days ago. Since then she has been feeling sore down towards her right jaw and up towards her right ear. Says that it especially hurts if she yawns or does something like that.  Says that this has never happened before and she has no history of problems with TMJ or similar symptoms. Has Had no tooth ache.     Home Meds:   Outpatient Prescriptions Prior to Visit  Medication Sig Dispense Refill  . ALBUTEROL IN Inhale into the lungs. MDI uses prn     . alendronate (FOSAMAX) 70 MG tablet Take 1 tablet (70 mg total) by mouth every 7 (seven) days. Take with a full glass of water on an empty stomach. 4 tablet 11  . aspirin 81 MG tablet Take 81 mg by mouth daily.      Marland Kitchen atorvastatin (LIPITOR) 40 MG tablet TAKE 1 TABLET (40 MG TOTAL) BY MOUTH DAILY. DOSE CHANGE 90 tablet 3  . citalopram (CELEXA) 20 MG tablet TAKE 1 TABLET (20 MG TOTAL) BY MOUTH DAILY. 30 tablet 5  . clopidogrel (PLAVIX) 75 MG tablet TAKE 1 TABLET BY MOUTH ONCE A DAY 90 tablet 1  . estradiol (ESTRACE) 1 MG tablet TAKE 1 TABLET EVERY DAY 90 tablet 3  . levothyroxine (SYNTHROID, LEVOTHROID) 125 MCG tablet TAKE 1 TABLET (125 MCG TOTAL) BY MOUTH DAILY. 30 tablet 5  . nitroGLYCERIN (NITROSTAT) 0.4 MG SL tablet Place 1 tablet (0.4 mg total) under the tongue every 5 (five) minutes as needed for chest pain. 25 tablet 6  . pantoprazole (PROTONIX) 40 MG tablet TAKE 1 TABLET (40 MG TOTAL) BY MOUTH DAILY. 30 tablet 11  . tiotropium (SPIRIVA) 18 MCG inhalation capsule Place 1 capsule (18 mcg total) into inhaler and inhale daily. 30 capsule 5  . buPROPion (WELLBUTRIN SR) 150 MG 12 hr tablet Take 1 tablet (150 mg total) by  mouth 2 (two) times daily. 60 tablet 3  . isosorbide mononitrate (IMDUR) 30 MG 24 hr tablet Take 1 tablet (30 mg total) by mouth daily. 30 tablet 11  . citalopram (CELEXA) 20 MG tablet TAKE 1 TABLET (20 MG TOTAL) BY MOUTH DAILY. 30 tablet 5   No facility-administered medications prior to visit.    Allergies:  Allergies  Allergen Reactions  . Bystolic [Nebivolol Hcl] Hives  . Cephalexin Hives      Review of Systems: See HPI for pertinent ROS. All other ROS negative.    Physical Exam: Blood pressure 122/80, pulse 64, temperature 98 F (36.7 C), temperature source Oral, resp. rate 18, weight 176 lb (79.833 kg)., Body mass index is 33.53 kg/(m^2). General:  WNWD WF. Appears in no acute distress. HEENT: Normocephalic, atraumatic, eyes without discharge, sclera non-icteric, nares are without discharge. Bilateral auditory canals clear, TM's are without perforation, pearly grey and translucent with reflective cone of light bilaterally. Oral cavity moist, posterior pharynx without exudate, erythema, peritonsillar abscess. There is tenderness with palpation of the Right TM Joint, especially as she opens and closes her jaw.  Neck: Supple. No thyromegaly. No lymphadenopathy. Lungs: Clear bilaterally to auscultation without wheezes, rales, or rhonchi. Breathing is unlabored.  Heart: Regular rhythm. No murmurs, rubs, or gallops. Msk:  Strength and tone normal for age. Extremities/Skin: Warm and dry. Neuro: Alert and oriented X 3. Moves all extremities spontaneously. Gait is normal. CNII-XII grossly in tact. Psych:  Responds to questions appropriately with a normal affect.     ASSESSMENT AND PLAN:  68 y.o. year old female with  1. TMJ arthralgia I wrote down the following instructions so that she will have them to refer to: Avoid hard crunchy foods Eat soft foods Avoid taking any large bites--such as with a burger or a large sandwich She can use the Motrin with food once a day as needed for  discomfort in the TM joint. Follow-up if symptoms are not resolved in 2 weeks. - meloxicam (MOBIC) 7.5 MG tablet; Take 1 tablet (7.5 mg total) by mouth daily.  Dispense: 30 tablet; Refill: 0   Signed, [address] [place], [place], [facility] 07/19/2014 2:33 PM

## 2014-07-27 ENCOUNTER — Other Ambulatory Visit: Payer: Self-pay | Admitting: Cardiology

## 2014-07-30 NOTE — Telephone Encounter (Signed)
Rx refill sent to patient pharmacy   

## 2014-08-20 ENCOUNTER — Encounter: Payer: Self-pay | Admitting: Family Medicine

## 2014-08-20 ENCOUNTER — Ambulatory Visit (INDEPENDENT_AMBULATORY_CARE_PROVIDER_SITE_OTHER): Payer: Medicare Other | Admitting: Family Medicine

## 2014-08-20 VITALS — BP 140/76 | HR 78 | Temp 97.3°F | Resp 16 | Ht 63.0 in | Wt 178.0 lb

## 2014-08-20 DIAGNOSIS — I2510 Atherosclerotic heart disease of native coronary artery without angina pectoris: Secondary | ICD-10-CM | POA: Diagnosis not present

## 2014-08-20 DIAGNOSIS — J441 Chronic obstructive pulmonary disease with (acute) exacerbation: Secondary | ICD-10-CM | POA: Diagnosis not present

## 2014-08-20 DIAGNOSIS — Z9861 Coronary angioplasty status: Secondary | ICD-10-CM | POA: Diagnosis not present

## 2014-08-20 DIAGNOSIS — Z720 Tobacco use: Secondary | ICD-10-CM | POA: Diagnosis not present

## 2014-08-20 MED ORDER — ALBUTEROL SULFATE HFA 108 (90 BASE) MCG/ACT IN AERS: 2.0000 | INHALATION_SPRAY | RESPIRATORY_TRACT | Status: DC | PRN

## 2014-08-20 MED ORDER — PREDNISONE 20 MG PO TABS: ORAL_TABLET | ORAL | Status: DC

## 2014-08-20 MED ORDER — DOXYCYCLINE HYCLATE 100 MG PO TABS: 100.0000 mg | ORAL_TABLET | Freq: Two times a day (BID) | ORAL | Status: DC

## 2014-08-20 NOTE — Assessment & Plan Note (Signed)
Treat for COPD exacerbation, prednisone burst x 5 days, doxycycline, albuterol prn, continue spiriva Advised on tobacco cessation,  Also on mucinex

## 2014-08-20 NOTE — Progress Notes (Signed)
Patient ID: Alexa Orozco, female   DOB: [birth year], 68 y.o.   MRN: [id number]   Subjective:    Patient ID: Alexa Orozco, female    DOB: Jan 25, 1946, 68 y.o.   MRN: [id number]  Patient presents for Illness  68 year old female with history of COPD controlled with Spiriva. She said cough with production wheezing for the past 2 weeks. She's also had some sinus drainage. No significant fever no chills no chest pain. Positive sick contact with a friend of hers. Note she does not have albuterol rescue inhaler at home. She also continues to smoke.    Review Of Systems:  GEN- denies fatigue, fever, weight loss,weakness, recent illness HEENT- denies eye drainage, change in vision, +nasal discharge, CVS- denies chest pain, palpitations RESP- denies SOB,+ cough, +wheeze ABD- denies N/V, change in stools, abd pain GU- denies dysuria, hematuria, dribbling, incontinence MSK- denies joint pain, muscle aches, injury Neuro- denies headache, dizziness, syncope, seizure activity       Objective:    BP 140/76 mmHg  Pulse 78  Temp(Src) 97.3 F (36.3 C) (Oral)  Resp 16  Ht 5\' 3"  (1.6 m)  Wt 178 lb (80.74 kg)  BMI 31.54 kg/m2 GEN- NAD, alert and oriented x3 HEENT- PERRL, EOMI, non injected sclera, pink conjunctiva, MMM, oropharynx clear TM clear bilat no effusion, no maxillary sinus tenderness, +Nasal drainage  Neck- Supple, no LAD CVS- RRR, no murmur RESP-few scattered wheeze, no rales, rhonchi upper lobes, normal WOB, harsh cough, no retractions EXT- No edema Pulses- Radial 2+          Assessment & Plan:      Problem List Items Addressed This Visit      Unprioritized   Tobacco abuse (Chronic)   COPD exacerbation - Primary   Relevant Medications      ALBUTEROL SULFATE HFA 108 (90 BASE) MCG/ACT IN AERS      predniSONE (DELTASONE) tablet      Note: This dictation was prepared with Dragon dictation along with smaller phrase technology. Any transcriptional errors that result from this process  are unintentional.

## 2014-08-20 NOTE — Patient Instructions (Signed)
Start prednisone, antibiotics and albuterol  F/U as needed

## 2014-08-21 ENCOUNTER — Telehealth: Payer: Self-pay | Admitting: *Deleted

## 2014-08-21 NOTE — Telephone Encounter (Signed)
Received call from patient.   Reports that she was seen in office on 08/20/2014. States that Doxycycline was prescribed. Reports that she has taken a few doses of the ABTx.   Reports that she woke up this morning with redness to face and nose. Denies itching, swelling, or difficulty breathing.   Advised to stop Doxycycline and go to nearest ER if shortness of breath or chest tightness present.   MD to be made aware.

## 2014-08-21 NOTE — Telephone Encounter (Signed)
Make sure she can take penecillin, send in AUgmentin 875-125mg  BID x 10 days

## 2014-08-21 NOTE — Telephone Encounter (Signed)
Call placed to patient. LMTRC.  

## 2014-08-22 MED ORDER — AMOXICILLIN-POT CLAVULANATE 875-125 MG PO TABS: 1.0000 | ORAL_TABLET | Freq: Two times a day (BID) | ORAL | Status: DC

## 2014-08-22 NOTE — Telephone Encounter (Signed)
Call placed to patient and patient made aware.   States that she does not think that she has ever taken Augmentin or Penicillin. Advised that if any S/Sx of distress noted while taking ABTx to discontinue immediately and contact office or go to nearest ER.  Prescription sent to pharmacy.

## 2014-08-29 ENCOUNTER — Other Ambulatory Visit: Payer: Self-pay | Admitting: Physician Assistant

## 2014-08-29 ENCOUNTER — Encounter: Payer: Self-pay | Admitting: Family Medicine

## 2014-08-29 ENCOUNTER — Other Ambulatory Visit: Payer: Self-pay

## 2014-08-29 DIAGNOSIS — E039 Hypothyroidism, unspecified: Secondary | ICD-10-CM

## 2014-08-29 NOTE — Telephone Encounter (Signed)
Medication refill for one time only.  Patient needs to be seen.  Letter sent for patient to call and schedule 

## 2014-08-30 ENCOUNTER — Other Ambulatory Visit: Payer: Self-pay

## 2014-08-30 MED ORDER — ATORVASTATIN CALCIUM 40 MG PO TABS: ORAL_TABLET | ORAL | Status: DC

## 2014-08-30 NOTE — Telephone Encounter (Signed)
Rx sent to pharmacy   

## 2014-09-03 ENCOUNTER — Encounter: Payer: Self-pay | Admitting: Family Medicine

## 2014-09-03 ENCOUNTER — Other Ambulatory Visit: Payer: Self-pay | Admitting: Physician Assistant

## 2014-09-03 NOTE — Telephone Encounter (Signed)
Medication refill for one time only.  Patient needs to be seen.  Letter sent for patient to call and schedule 

## 2014-09-13 ENCOUNTER — Encounter: Payer: Self-pay | Admitting: Cardiology

## 2014-09-13 ENCOUNTER — Ambulatory Visit (INDEPENDENT_AMBULATORY_CARE_PROVIDER_SITE_OTHER): Payer: Medicare Other | Admitting: Cardiology

## 2014-09-13 VITALS — BP 112/82 | HR 54 | Ht 61.0 in | Wt 177.9 lb

## 2014-09-13 DIAGNOSIS — I10 Essential (primary) hypertension: Secondary | ICD-10-CM | POA: Diagnosis not present

## 2014-09-13 DIAGNOSIS — I739 Peripheral vascular disease, unspecified: Secondary | ICD-10-CM

## 2014-09-13 DIAGNOSIS — Z9861 Coronary angioplasty status: Secondary | ICD-10-CM | POA: Diagnosis not present

## 2014-09-13 DIAGNOSIS — J449 Chronic obstructive pulmonary disease, unspecified: Secondary | ICD-10-CM

## 2014-09-13 DIAGNOSIS — Z720 Tobacco use: Secondary | ICD-10-CM

## 2014-09-13 DIAGNOSIS — E785 Hyperlipidemia, unspecified: Secondary | ICD-10-CM

## 2014-09-13 DIAGNOSIS — E669 Obesity, unspecified: Secondary | ICD-10-CM | POA: Diagnosis not present

## 2014-09-13 DIAGNOSIS — I209 Angina pectoris, unspecified: Secondary | ICD-10-CM

## 2014-09-13 DIAGNOSIS — I208 Other forms of angina pectoris: Secondary | ICD-10-CM | POA: Diagnosis not present

## 2014-09-13 DIAGNOSIS — Z6833 Body mass index (BMI) 33.0-33.9, adult: Secondary | ICD-10-CM

## 2014-09-13 DIAGNOSIS — I2510 Atherosclerotic heart disease of native coronary artery without angina pectoris: Secondary | ICD-10-CM | POA: Diagnosis not present

## 2014-09-13 NOTE — Patient Instructions (Signed)
Your physician wants you to follow-up in: 1 year with Dr.Harding. You will receive a reminder letter in the mail two months in advance. If you don't receive a letter, please call our office to schedule the follow-up appointment.  DISCONTINUE  Your IMDUR

## 2014-09-14 ENCOUNTER — Encounter: Payer: Self-pay | Admitting: Cardiology

## 2014-09-14 NOTE — Assessment & Plan Note (Signed)
Followed byPCP. Last check I saw was from March of last year that she was almost at goal on current dose of atorvastatin. She should be due for repeat check later this month.

## 2014-09-14 NOTE — Assessment & Plan Note (Signed)
Lack of activity. The patient understands the need to lose weight with diet and exercise. We have discussed specific strategies for this.

## 2014-09-14 NOTE — Assessment & Plan Note (Addendum)
No active evidence of angina. Negative stress test in October 24. 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 Is on combination of aspirin and Plavix. With the first-generation DES stent, I would like to continue Plavix but to stop aspirin. Okay to stop Plavix and replaced with aspirin for procedures where there is any bleeding concerns. He is on statin. Never had Imdur prescription filled. Not using beta blockers due to bradycardia. Blood pressure is stable, therefore will not consider it at this time.

## 2014-09-14 NOTE — Progress Notes (Addendum)
PATIENT: Alexa Orozco MRN: [id number]  DOB: 02-02-1946   DOV:09/14/2014 PCP: [name], PA-C  Clinic Note: Chief Complaint  Patient presents with  . Follow-up    1 year. has flutter feeling in chest on occasion.   . Coronary Artery Disease   HPI: Alexa Orozco is a 69 y.o. female with a PMH below who presents today for one-month followup after closure we Dopplers a nuclear stress test was performed. I saw her back in the beginning of October for fatigue and tiredness. Also tightening in the neck with the strenuous activity. He has no dyspnea on exertion. This is also associated with leg pain with possible claudication. She has had a nuclear stress test in the artery Dopplers performed but which were relatively normal.  Interval History: She presents today, still having shortness of breath with exertion. She does not have cut down her smoking to about half a pack a day, he continues to try. Fortunately she gained about 30 pounds the past year. She simply just not reactive and is not been exercising enough to keep herself in shape. She was quite relieved to find out the nuclear stress test was negative for ischemia. She also doesn't note the fullness in the throat and chest as much she had. She denies any significant chest tightness or pressure with exertion currently. He does note some swelling in her feet and lightheadedness as noted above. Still has fatigue, stating that she feels tired all day.  The remainder of Cardiovascular ROS: positive for - dyspnea on exertion (chronic), occasional - fleeting fluttering sensations negative for - chest pain, edema, irregular rhythm, loss of consciousness, murmur, orthopnea, paroxysmal nocturnal dyspnea, rapid heart rate or shortness of breath, syncope/near-syncope, TIA/amaurosis fugax. No claudication. Easily fatigued -not very active.  Past Medical History  Diagnosis Date  . Cataract     h/o bilat repair  . Asthma   . Depression   . Emphysema of lung    . Allergy   . Hypothyroidism   . CAD S/P percutaneous coronary angioplasty February 2007    Class III Angina --> Cath: 85% -- PCI 3.63mm x 1mm (4.0 mm) Cypher DES to RCA; Myoview October 2014: normal stress test: LOW RISK; mild anteroapical breast attenuation  . History of palpitations   . GERD (gastroesophageal reflux disease)   . Dyslipidemia   . Raynaud disease   . History of tobacco abuse   . Hypertension     well-controlled  . Osteoporosis   . Depression 11/29/2013    Allergies  Allergen Reactions  . Bystolic [Nebivolol Hcl] Hives  . Cephalexin Hives    Current Outpatient Prescriptions  Medication Sig Dispense Refill  . albuterol (PROVENTIL HFA;VENTOLIN HFA) 108 (90 BASE) MCG/ACT inhaler Inhale 2 puffs into the lungs every 4 (four) hours as needed for wheezing or shortness of breath. 1 Inhaler 3  . ALBUTEROL IN Inhale into the lungs. MDI uses prn     . amoxicillin-clavulanate (AUGMENTIN) 875-125 MG per tablet Take 1 tablet by mouth 2 (two) times daily. 20 tablet 0  . aspirin 81 MG tablet Take 81 mg by mouth daily.      Marland Kitchen atorvastatin (LIPITOR) 40 MG tablet TAKE 1 TABLET (40 MG TOTAL) BY MOUTH DAILY. DOSE CHANGE 30 tablet 0  . citalopram (CELEXA) 20 MG tablet TAKE 1 TABLET (20 MG TOTAL) BY MOUTH DAILY. 30 tablet 5  . clopidogrel (PLAVIX) 75 MG tablet TAKE 1 TABLET BY MOUTH ONCE A DAY 90 tablet 0  . doxycycline (  VIBRA-TABS) 100 MG tablet Take 1 tablet (100 mg total) by mouth 2 (two) times daily. 20 tablet 0  . estradiol (ESTRACE) 1 MG tablet TAKE 1 TABLET EVERY DAY 90 tablet 3  . levothyroxine (SYNTHROID, LEVOTHROID) 125 MCG tablet TAKE 1 TABLET BY MOUTH DAILY 30 tablet 0  . nitroGLYCERIN (NITROSTAT) 0.4 MG SL tablet Place 1 tablet (0.4 mg total) under the tongue every 5 (five) minutes as needed for chest pain. 25 tablet 6  . pantoprazole (PROTONIX) 40 MG tablet TAKE 1 TABLET (40 MG TOTAL) BY MOUTH DAILY. 30 tablet 11  . predniSONE (DELTASONE) 20 MG tablet Take 40mg  x 5 days 10  tablet 0  . tiotropium (SPIRIVA) 18 MCG inhalation capsule Place 1 capsule (18 mcg total) into inhaler and inhale daily. 30 capsule 5   No current facility-administered medications for this visit.   History   Social History Narrative   She is a widowed mother of 29, grandmother for, great-grandmother 61.  She still smokes about half pack a day.  She doesn't have about 35 years.  She does not drink.   Prior to her symptoms coming on, she used to do all kind of walking around and working in the garden and other activities with her close friend.   ROS: A comprehensive Review of Systems - Negative except If not noted above  PHYSICAL EXAM BP 112/82 mmHg  Pulse 54  Ht 5\' 1"  (1.549 m)  Wt 177 lb 14.4 oz (80.695 kg)  BMI 33.63 kg/m2 General appearance: alert, cooperative, appears stated age, no distress and mildly obese  Neck: no LAN, no carotid bruit and no JVD  Lungs: CTAB., normal percussion bilaterally and non-labored  Heart: RRR, S1, S2 normal, no murmur, click, rub or gallop; nondisplaced PMI  Abdomen: soft, non-tender; bowel sounds normal; no masses, no organomegaly; mild truncal obesity  Extremities: extremities normal, atraumatic, no cyanosis, and no edema  Pulses: Trace to 1+ but palpable on DP and PT pulses. Mostly symmetric, but somewhat more diminished on the left;  Neurologic: Mental status: Alert, oriented, thought content appropriate  Cranial nerves: normal (II-XII grossly intact)  [id number] today: yes Size bradycardia, rate 54 bpm. Otherwise normal EKG with normal axis, intervals and durations.  Recent Labs: no new labs.  ASSESSMENT / PLAN: Followup cardiac evaluation with a relatively normal studies. No significant changes made.  Stable angina No further anginal symptoms. Has not had to use any sublingual nitroglycerin.   CAD S/P percutaneous coronary angioplasty No active evidence of angina. Negative stress test in October 24. Her episodes in 2014 were probably  related to muscloskeletal disease. Is on combination of aspirin and Plavix. With the first-generation DES stent, I would like to continue Plavix but to stop aspirin. Okay to stop Plavix and replaced with aspirin for procedures where there is any bleeding concerns. He is on statin. Never had Imdur prescription filled. Not using beta blockers due to bradycardia. Blood pressure is stable, therefore will not consider it at this time.   Claudication of left lower extremity ,Notable improvement. Recommend walking. Did not complain of symptoms today. Continue cardiovascular risk factor modification   Dyslipidemia Followed byPCP. Last check I saw was from March of last year that she was almost at goal on current dose of atorvastatin. She should be due for repeat check later this month.    Tobacco abuse The patient was counseled on the dangers of tobacco use, and was advised to quit, referred to a tobacco cessation program and reluctant  to quit.  Reviewed strategies to maximize success, including removing cigarettes and smoking materials from environment, stress management, substitution of other forms of reinforcement, support of family/friends and local smoking cessation programs ([employer]uit [employer]).   Obesity (BMI 30-39.9) Lack of activity. The patient understands the need to lose weight with diet and exercise. We have discussed specific strategies for this.     Orders Placed This Encounter  Procedures  . EKG 12-Lead   No orders of the defined types were placed in this encounter.    Followup: One year  Leonie Man, M.D., M.S. Interventional Cardiologist   Pager # [phone]

## 2014-09-14 NOTE — Assessment & Plan Note (Addendum)
,  Notable improvement. Recommend walking. Did not complain of symptoms today. Continue cardiovascular risk factor modification

## 2014-09-14 NOTE — Assessment & Plan Note (Signed)
The patient was counseled on the dangers of tobacco use, and was advised to quit, referred to a tobacco cessation program and reluctant to quit.  Reviewed strategies to maximize success, including removing cigarettes and smoking materials from environment, stress management, substitution of other forms of reinforcement, support of family/friends and local smoking cessation programs (Quit [employer]).

## 2014-09-14 NOTE — Assessment & Plan Note (Signed)
No further anginal symptoms. Has not had to use any sublingual nitroglycerin.

## 2014-09-19 ENCOUNTER — Telehealth: Payer: Self-pay | Admitting: Family Medicine

## 2014-09-19 ENCOUNTER — Encounter: Payer: Self-pay | Admitting: Physician Assistant

## 2014-09-19 ENCOUNTER — Ambulatory Visit (INDEPENDENT_AMBULATORY_CARE_PROVIDER_SITE_OTHER): Payer: Medicare Other | Admitting: Physician Assistant

## 2014-09-19 VITALS — BP 114/68 | HR 60 | Temp 97.5°F | Resp 18 | Wt 177.0 lb

## 2014-09-19 DIAGNOSIS — Z9861 Coronary angioplasty status: Secondary | ICD-10-CM

## 2014-09-19 DIAGNOSIS — I2510 Atherosclerotic heart disease of native coronary artery without angina pectoris: Secondary | ICD-10-CM | POA: Diagnosis not present

## 2014-09-19 DIAGNOSIS — I739 Peripheral vascular disease, unspecified: Secondary | ICD-10-CM

## 2014-09-19 DIAGNOSIS — I208 Other forms of angina pectoris: Secondary | ICD-10-CM

## 2014-09-19 DIAGNOSIS — Z720 Tobacco use: Secondary | ICD-10-CM | POA: Diagnosis not present

## 2014-09-19 DIAGNOSIS — E669 Obesity, unspecified: Secondary | ICD-10-CM

## 2014-09-19 DIAGNOSIS — G4700 Insomnia, unspecified: Secondary | ICD-10-CM | POA: Insufficient documentation

## 2014-09-19 DIAGNOSIS — E785 Hyperlipidemia, unspecified: Secondary | ICD-10-CM

## 2014-09-19 DIAGNOSIS — Z5181 Encounter for therapeutic drug level monitoring: Secondary | ICD-10-CM | POA: Diagnosis not present

## 2014-09-19 DIAGNOSIS — F32A Depression, unspecified: Secondary | ICD-10-CM

## 2014-09-19 DIAGNOSIS — M810 Age-related osteoporosis without current pathological fracture: Secondary | ICD-10-CM | POA: Diagnosis not present

## 2014-09-19 DIAGNOSIS — F411 Generalized anxiety disorder: Secondary | ICD-10-CM | POA: Insufficient documentation

## 2014-09-19 DIAGNOSIS — J439 Emphysema, unspecified: Secondary | ICD-10-CM | POA: Diagnosis not present

## 2014-09-19 DIAGNOSIS — E038 Other specified hypothyroidism: Secondary | ICD-10-CM | POA: Diagnosis not present

## 2014-09-19 DIAGNOSIS — I10 Essential (primary) hypertension: Secondary | ICD-10-CM | POA: Diagnosis not present

## 2014-09-19 DIAGNOSIS — I7300 Raynaud's syndrome without gangrene: Secondary | ICD-10-CM | POA: Diagnosis not present

## 2014-09-19 DIAGNOSIS — F329 Major depressive disorder, single episode, unspecified: Secondary | ICD-10-CM | POA: Diagnosis not present

## 2014-09-19 MED ORDER — ESZOPICLONE 3 MG PO TABS: 3.0000 mg | ORAL_TABLET | Freq: Every day | ORAL | Status: DC

## 2014-09-19 MED ORDER — CITALOPRAM HYDROBROMIDE 40 MG PO TABS: 40.0000 mg | ORAL_TABLET | Freq: Every day | ORAL | Status: DC

## 2014-09-19 NOTE — Telephone Encounter (Signed)
Is on Celexa 20 mg at home.  Will take two to equal 40 mg dose then go get new RX.  Told her you had already sent in Rx for 40 mg dose.

## 2014-09-19 NOTE — Progress Notes (Signed)
Patient ID: Alexa Orozco MRN: [id number], DOB: 05-16-1946, 69 y.o. Date of Encounter: 09/19/2014,   Chief Complaint: Routine OV to f/u multiple medical problems.   HPI: 69 y.o. y/o female  here for routine f/u OV.    Regarding her COPD, she states that she is using Spiriva. She quit the Symbicort secondary to cost. States that both the Spiriva and the Symbicort are expensive and are especially expensive when trying to pay for both of these.  She sees Dr. Ellyn Hack at [facility] routinely. Last visit there was just 07/07/13.  Today she reports that she sometimes feels anxious, stressed. Says she is not feeling depressed.  Says she has been having trouble with one of her children.  Says she does not do well when she is around a lot of people.  Says "things upset her more than they used to."   Also, yawns throughout visit. Says she has no problem falling asleep but never stays asleep. Frequently wakes around 4 or 5 am.  Says in past Lunesta worked well but was discontinued sec to cost. Says has different insurance now.  Says her Rx of Thyroid med ran out one week ago. At that time started using old bottle of 150mcg dose.   No other complaints/concerns today.    Review of Systems: Consitutional: No fever, chills,  night sweats, lymphadenopathy. Eyes: No visual changes, eye redness, or discharge. ENT/Mouth: No ear pain, sore throat, nasal drainage, or sinus pain. Cardiovascular: No chest pressure,heaviness, tightness or squeezing, even with exertion. No increased shortness of breath or dyspnea on exertion.No palpitations, edema, orthopnea, PND. Respiratory: No  Hemoptysis. Gastrointestinal: No anorexia, dysphagia, reflux, pain, nausea, vomiting, hematemesis, diarrhea, constipation, BRBPR, or melena. Breast: No mass, nodules, bulging, or retraction. No skin changes or inflammation. No nipple discharge. No lymphadenopathy. Genitourinary: No dysuria, hematuria, incontinence, vaginal  discharge, pruritis, burning, abnormal bleeding, or pain. Musculoskeletal: No decreased ROM, No joint pain or swelling. No significant pain in neck, back, or extremities. Skin: No rash, pruritis, or concerning lesions. Neurological: No headache, dizziness, syncope, seizures, tremors, memory loss, coordination problems, or paresthesias. Psychological: No depression, hallucinations, SI/HI. Endocrine: No polydipsia, polyphagia, polyuria, or known diabetes. No palpitations/rapid heart rate.  All other systems were reviewed and are otherwise negative.  Past Medical History  Diagnosis Date  . Cataract     h/o bilat repair  . Asthma   . Depression   . Emphysema of lung   . Allergy   . Hypothyroidism   . CAD S/P percutaneous coronary angioplasty February 2007    Class III Angina --> Cath: 85% -- PCI 3.26mm x 14mm (4.0 mm) Cypher DES to RCA; Myoview October 2014: normal stress test: LOW RISK; mild anteroapical breast attenuation  . History of palpitations   . GERD (gastroesophageal reflux disease)   . Dyslipidemia   . Raynaud disease   . History of tobacco abuse   . Hypertension     well-controlled  . Osteoporosis   . Depression 11/29/2013     Past Surgical History  Procedure Laterality Date  . Abdominal hysterectomy      h/o partila hysterectomy  . Tonsillectomy  1959  . Breast biopsy  1988  . Coronary angioplasty with stent placement  10/14/2005    cypher DES (3.2mmx18mm) to high grade RCA lesion  . Transthoracic echocardiogram  05/22/2009    EF=>55%, normal LV systolic function; normal RV systolic function; mild MR/TR  . Nm myoview ltd  July 2012 of October 2014    '  12: dipyridamole; Normal, low risk study; 2014 - normal stress test: LOW RISK; mild anteroapical breast attenuation    Home Meds:  Current Outpatient Prescriptions on File Prior to Visit  Medication Sig Dispense Refill  . albuterol (PROVENTIL HFA;VENTOLIN HFA) 108 (90 BASE) MCG/ACT inhaler Inhale 2 puffs into the lungs  every 4 (four) hours as needed for wheezing or shortness of breath. 1 Inhaler 3  . atorvastatin (LIPITOR) 40 MG tablet TAKE 1 TABLET (40 MG TOTAL) BY MOUTH DAILY. DOSE CHANGE 30 tablet 0  . citalopram (CELEXA) 20 MG tablet TAKE 1 TABLET (20 MG TOTAL) BY MOUTH DAILY. 30 tablet 5  . clopidogrel (PLAVIX) 75 MG tablet TAKE 1 TABLET BY MOUTH ONCE A DAY 90 tablet 0  . estradiol (ESTRACE) 1 MG tablet TAKE 1 TABLET EVERY DAY 90 tablet 3  . levothyroxine (SYNTHROID, LEVOTHROID) 125 MCG tablet TAKE 1 TABLET BY MOUTH DAILY 30 tablet 0  . nitroGLYCERIN (NITROSTAT) 0.4 MG SL tablet Place 1 tablet (0.4 mg total) under the tongue every 5 (five) minutes as needed for chest pain. 25 tablet 6  . pantoprazole (PROTONIX) 40 MG tablet TAKE 1 TABLET (40 MG TOTAL) BY MOUTH DAILY. 30 tablet 11  . tiotropium (SPIRIVA) 18 MCG inhalation capsule Place 1 capsule (18 mcg total) into inhaler and inhale daily. 30 capsule 5   No current facility-administered medications on file prior to visit.    Allergies:  Allergies  Allergen Reactions  . Bystolic [Nebivolol Hcl] Hives  . Cephalexin Hives    History   Social History  . Marital Status: Widowed    Spouse Name: N/A    Number of Children: 3  . Years of Education: 10th    Occupational History  . Retired    Social History Main Topics  . Smoking status: Current Every Day Smoker -- 0.50 packs/day for 35 years    Types: Cigarettes  . Smokeless tobacco: Never Used  . Alcohol Use: No  . Drug Use: No  . Sexual Activity: Not on file   Other Topics Concern  . Not on file   Social History Narrative   She is a widowed mother of 56, grandmother for, great-grandmother 40.  She still smokes about half pack a day.  She doesn't have about 35 years.  She does not drink.   Prior to her symptoms coming on, she used to do all kind of walking around and working in the garden and other activities with her close friend.    Family History  Problem Relation Age of Onset  . Liver  cancer Mother   . Heart attack Mother   . Heart Problems Sister     Physical Exam: Blood pressure 114/68, pulse 60, temperature 97.5 F (36.4 C), temperature source Oral, resp. rate 18, weight 177 lb (80.287 kg)., Body mass index is 33.46 kg/(m^2). General: Well developed, well nourished, WF Appears in no acute distress.  Neck: Supple. Trachea midline. No thyromegaly. Full ROM. No lymphadenopathy.No Carotid Bruits. Lungs: Clear to auscultation bilaterally without wheezes, rales, or rhonchi. Breathing is of normal effort and unlabored. Cardiovascular: RRR with S1 S2. No murmurs, rubs, or gallops. Distal pulses 2+ symmetrically. No carotid or abdominal bruits. Abdomen: Soft, non-tender, non-distended with normoactive bowel sounds. No hepatosplenomegaly or masses. No rebound/guarding. No CVA tenderness. No hernias.  Musculoskeletal: Full range of motion and 5/5 strength throughout. Without swelling. Skin: Warm and moist without erythema, ecchymosis, wounds, or rash. Neuro: A+Ox3. CN II-XII grossly intact. Moves all extremities spontaneously.  Normal gait.  Psych:  Responds to questions appropriately with a normal affect.   Assessment/Plan:  69 y.o. y/o female here for    Osteoporosis See Section-D  Under Preventive Care for DETAILS Fosamax Rxed at CPE 11/29/13 Is to take calcium and vitamin D as directed. Also at tha [place] she Planned to start an exercise program so weightbearing exercise will help with this as well.    Essential hypertension - COMPLETE METABOLIC PANEL WITH GFR   Tobacco abuse  Pulmonary emphysema, unspecified emphysema type   COPD (chronic obstructive pulmonary disease) On Spiriva. Stopped Symbicort secondary to cost.    5. Other specified hypothyroidism - TSH  6. Dyslipidemia  7. CAD S/P percutaneous coronary angioplasty  8. Obesity (BMI 30-39.9)  9. Claudication of left lower extremity  10. Raynaud's disease  11. Depression  12. Medication monitoring  encounter On Plavix - CBC with Differential  13. Insomnia Lunesta - Eszopiclone 3 MG TABS; Take 1 tablet (3 mg total) by mouth at bedtime. Take immediately before bedtime  Dispense: 30 tablet; Refill: 3  14. Generalized anxiety disorder Increase Celexa 20mg  to 40mg .  - citalopram (CELEXA) 40 MG tablet; Take 1 tablet (40 mg total) by mouth daily.  Dispense: 30 tablet; Refill: 3   THE FOLLOWING IS COPIED FROM CPE NOTE 11/29/2013:   Visit for preventive health examination  A. Screening Labs: Patient came and had fasting labs 11/27/13.  TSH is significantly elevated at 32.163. I reviewed this with her. Also reviewed prior recent TSHs.  10 months ago TSH was 0.901.    11 months ago TSH was 1.959. I asked her  multiple times, but she repeatedly states that she knows that she definitely is taking her thyroid pill daily and that this has not accidentally run out. As well she says that she has had significant weight gain just in the last few months and says that she has been  eating the same diet and doing the same amount of activity and exercise as always. Also says that she is having significant increased fatigue in the last few months. See # 7 below.  Will  increase dose and then get recheck TSH in 6 weeks.  CMET, FLP, CBC all normal. FLP at goal.  B. Pap: She has a history of partial hysterectomy. This was not performed secondary to any cancer. Therefore she has no indication for further Pap smears. However, I did discuss with her today that she still needs to have annual pelvic exams. However she defers. Aware of risk versus benefit but still defers.  C. Screening Mammogram: She states that she has had mammogram in the past 12 months and this was negative.  D. DEXA/BMD:  Last DEXA was performed 02/15/2012. She was informed that this showed osteoporosis in the hip and the spine. She was prescribed Fosamax 70 mg once a week to take on an empty stomach separate from other medicines. She  reports that she took that for a couple of months but then stopped it. Says she had a hard time remembering to take it once a week. Says she just decided to stop it but no other good reason. Was having no adverse effects. Discussed need for medication and she is agreeable to restart this. Says that she does have a new type cellphone and can set alarm clock on this for once a week.  E. Colorectal Cancer Screening: I had discussed this at her visit here 11/2011.  At that time she reported that this was  performed approximately 2010 at [facility] and it was normal.  However according to the health maintenance section of Epic, this was performed 08/31/2005. Repeat 2017.  F. Immunizations:  Influenza: N/A Tetanus:  Tdap given here 12/23/2011 Pneumococcal:  Pneumovax 23 was given here 12/23/2011.   I discussed getting the Prevnar 13 today and she is agreeable to do so. Zostavax:  She received Zostavax 01/17/2013   Return for regular office visit in 6 months or sooner if needed.  Marin [place], [place], [facility] 09/19/2014 3:18 PM

## 2014-09-20 LAB — COMPLETE METABOLIC PANEL WITH GFR
ALBUMIN: 4 g/dL (ref 3.5–5.2)
ALK PHOS: 95 U/L (ref 39–117)
ALT: 25 U/L (ref 0–35)
AST: 25 U/L (ref 0–37)
BUN: 20 mg/dL (ref 6–23)
CALCIUM: 10.2 mg/dL (ref 8.4–10.5)
CHLORIDE: 103 meq/L (ref 96–112)
CO2: 29 mEq/L (ref 19–32)
Creat: 0.89 mg/dL (ref 0.50–1.10)
GFR, EST NON AFRICAN AMERICAN: 68 mL/min
GFR, Est African American: 78 mL/min
Glucose, Bld: 85 mg/dL (ref 70–99)
POTASSIUM: 4.9 meq/L (ref 3.5–5.3)
Sodium: 140 mEq/L (ref 135–145)
Total Bilirubin: 0.3 mg/dL (ref 0.2–1.2)
Total Protein: 7.4 g/dL (ref 6.0–8.3)

## 2014-09-20 LAB — CBC WITH DIFFERENTIAL/PLATELET
Basophils Absolute: 0 10*3/uL (ref 0.0–0.1)
Basophils Relative: 0 % (ref 0–1)
Eosinophils Absolute: 0.7 10*3/uL (ref 0.0–0.7)
Eosinophils Relative: 7 % — ABNORMAL HIGH (ref 0–5)
HCT: 40.9 % (ref 36.0–46.0)
Hemoglobin: 13.5 g/dL (ref 12.0–15.0)
Lymphocytes Relative: 38 % (ref 12–46)
Lymphs Abs: 3.5 10*3/uL (ref 0.7–4.0)
MCH: 30.7 pg (ref 26.0–34.0)
MCHC: 33 g/dL (ref 30.0–36.0)
MCV: 93 fL (ref 78.0–100.0)
MONO ABS: 1 10*3/uL (ref 0.1–1.0)
MPV: 10.3 fL (ref 8.6–12.4)
Monocytes Relative: 11 % (ref 3–12)
NEUTROS ABS: 4.1 10*3/uL (ref 1.7–7.7)
NEUTROS PCT: 44 % (ref 43–77)
PLATELETS: 336 10*3/uL (ref 150–400)
RBC: 4.4 MIL/uL (ref 3.87–5.11)
RDW: 13.6 % (ref 11.5–15.5)
WBC: 9.3 10*3/uL (ref 4.0–10.5)

## 2014-09-20 LAB — TSH: TSH: 0.157 u[IU]/mL — ABNORMAL LOW (ref 0.350–4.500)

## 2014-09-20 NOTE — Telephone Encounter (Signed)
Ok

## 2014-09-24 ENCOUNTER — Telehealth: Payer: Self-pay | Admitting: Family Medicine

## 2014-09-24 ENCOUNTER — Other Ambulatory Visit: Payer: Self-pay | Admitting: Family Medicine

## 2014-09-24 DIAGNOSIS — E785 Hyperlipidemia, unspecified: Secondary | ICD-10-CM

## 2014-09-24 DIAGNOSIS — Z79899 Other long term (current) drug therapy: Secondary | ICD-10-CM

## 2014-09-24 DIAGNOSIS — E039 Hypothyroidism, unspecified: Secondary | ICD-10-CM

## 2014-09-24 MED ORDER — LEVOTHYROXINE SODIUM 125 MCG PO TABS: 125.0000 ug | ORAL_TABLET | Freq: Every day | ORAL | Status: DC

## 2014-09-24 NOTE — Telephone Encounter (Signed)
Pt aware of lab results and provider recommendations.  RX for thyroid to pharmacy and orders for 3 mth labs entered.  Next OV due in July

## 2014-09-24 NOTE — Telephone Encounter (Signed)
-----   Message from Orlena Sheldon, PA-C sent at 09/20/2014  7:16 AM EST ----- TSH is close to normal (also, she had run out of current Rx x 1 week and had used old bottle of dfrferent dose)---Tell her to cont currnet dose but Recheck 3 months instead of waiting 6 months (Also can check FLP at that time--wasnot fasting yesterday so skipped this) Other labs normal.,

## 2014-09-28 ENCOUNTER — Other Ambulatory Visit: Payer: Self-pay | Admitting: Cardiology

## 2014-09-28 NOTE — Telephone Encounter (Signed)
Rx(s) sent to pharmacy electronically.  

## 2014-10-26 ENCOUNTER — Other Ambulatory Visit: Payer: Self-pay | Admitting: Cardiology

## 2014-10-26 NOTE — Telephone Encounter (Signed)
Rx(s) sent to pharmacy electronically.  

## 2014-11-10 ENCOUNTER — Other Ambulatory Visit: Payer: Self-pay | Admitting: Family Medicine

## 2014-11-14 ENCOUNTER — Other Ambulatory Visit: Payer: Self-pay | Admitting: Physician Assistant

## 2014-11-14 ENCOUNTER — Telehealth: Payer: Self-pay | Admitting: *Deleted

## 2014-11-14 DIAGNOSIS — F411 Generalized anxiety disorder: Secondary | ICD-10-CM

## 2014-11-14 MED ORDER — CITALOPRAM HYDROBROMIDE 40 MG PO TABS: 40.0000 mg | ORAL_TABLET | Freq: Every day | ORAL | Status: DC

## 2014-11-14 NOTE — Telephone Encounter (Signed)
Received call form Christy at [facility] stating that pt stated she was on 40mg  celexa and they have on record 20mg , looked in last phone note and stated that pt was on 20mg  and her dose had increased to 40mg  and that she will take 2 of the 20mg  and will cal from refill, pt is ready for 40mg .  Script sent by escribe.

## 2014-11-15 NOTE — Telephone Encounter (Signed)
Refill appropriate and filled per protocol. 

## 2014-12-22 ENCOUNTER — Other Ambulatory Visit: Payer: Self-pay | Admitting: Physician Assistant

## 2014-12-24 NOTE — Telephone Encounter (Signed)
Refill x 1 moth only.  Pt needs follow up lab work.  Left her message to come have done

## 2015-01-10 ENCOUNTER — Other Ambulatory Visit: Payer: Self-pay | Admitting: Physician Assistant

## 2015-01-10 NOTE — Telephone Encounter (Signed)
Refill appropriate and filled per protocol. 

## 2015-01-14 ENCOUNTER — Other Ambulatory Visit: Payer: Self-pay | Admitting: Physician Assistant

## 2015-01-14 NOTE — Telephone Encounter (Signed)
Approved. #[id number]. 

## 2015-01-14 NOTE — Telephone Encounter (Signed)
rx called in

## 2015-01-14 NOTE — Telephone Encounter (Signed)
LOV 09/13/14  LRF 09/19/14 #30 + 3.  OK Refill?

## 2015-01-21 ENCOUNTER — Encounter: Payer: Self-pay | Admitting: Physician Assistant

## 2015-01-21 ENCOUNTER — Ambulatory Visit (INDEPENDENT_AMBULATORY_CARE_PROVIDER_SITE_OTHER): Payer: Medicare Other | Admitting: Physician Assistant

## 2015-01-21 VITALS — BP 120/58 | HR 75 | Temp 98.0°F | Resp 19 | Wt 176.0 lb

## 2015-01-21 DIAGNOSIS — B9689 Other specified bacterial agents as the cause of diseases classified elsewhere: Secondary | ICD-10-CM

## 2015-01-21 DIAGNOSIS — G4700 Insomnia, unspecified: Secondary | ICD-10-CM

## 2015-01-21 DIAGNOSIS — F329 Major depressive disorder, single episode, unspecified: Secondary | ICD-10-CM | POA: Diagnosis not present

## 2015-01-21 DIAGNOSIS — I208 Other forms of angina pectoris: Secondary | ICD-10-CM | POA: Diagnosis not present

## 2015-01-21 DIAGNOSIS — F32A Depression, unspecified: Secondary | ICD-10-CM

## 2015-01-21 DIAGNOSIS — Z720 Tobacco use: Secondary | ICD-10-CM

## 2015-01-21 DIAGNOSIS — F411 Generalized anxiety disorder: Secondary | ICD-10-CM

## 2015-01-21 DIAGNOSIS — J988 Other specified respiratory disorders: Secondary | ICD-10-CM | POA: Diagnosis not present

## 2015-01-21 DIAGNOSIS — J439 Emphysema, unspecified: Secondary | ICD-10-CM | POA: Diagnosis not present

## 2015-01-21 MED ORDER — AZITHROMYCIN 250 MG PO TABS: ORAL_TABLET | ORAL | Status: DC

## 2015-01-21 MED ORDER — DULOXETINE HCL 60 MG PO CPEP: 60.0000 mg | ORAL_CAPSULE | Freq: Every day | ORAL | Status: DC

## 2015-01-21 NOTE — Progress Notes (Signed)
Patient ID: Shaquille Murdy MRN: [id number], DOB: Mar 27, 1946, 69 y.o. Date of Encounter: 01/21/2015,   Chief Complaint: Cough, Anxiety  HPI: 69 y.o. y/o female    AT HER OV 09/19/2014:  She reported that she sometimes feels anxious, stressed. Said she was not feeling depressed.  Said she has been having trouble with one of her children.  Said she does not do well when she is around a lot of people.  Said "things upset her more than they used to."   Also, I noticed that she yawned throughout that visit. She said she had no problem falling asleep but never stayed asleep. Frequently was waking around 4 or 5 am.  [name] in past Lunesta worked well but was discontinued sec to cost. [name] she  had different insurance now.  At that [place] 09/19/2014--- Increased Celexa from 20-40 mg. I also prescribed Lunesta 3 mg daily at bedtime when necessary.  TODAY---01/21/15--- She says that the [name] is working great. She is able to afford it with her current insurance.  However,  she states that her "nerves are really bad". " Feels shaky. Sometimes feels a little bit panicky. At times, can cry. Says this has been going on for several months.  Says that some days she feels like she is just going through the motions but doesn't feel really "there".  Says that things definitely are not any better since we increased the Celexa in January.  Asked how much of this may be because of things going on in her life. She says that things really are probably about the same as far as external factors go. She says that this situation with that one child is about the same. Is that she feels like multiple people need her help and she feels like she is falling apart. Says that overall the external situation is about the same as far as same amount of stressors etc. But feels like she is even more shaky and nervous than usual. Only medications she thinks that she is ever used for this are the Celexa and she also used Wellbutrin in  the past.  At visit 01/21/15 her only other concern/complaint today is cough. Says that she has had a cough for over one week. Using Spiriva daily and has been using her albuterol the past 3-4 days.    AT OV 09/19/2014 she had also reported: Regarding her COPD, she states that she is using Spiriva. She quit the Symbicort secondary to cost. States that both the Spiriva and the Symbicort are expensive and are especially expensive when trying to pay for both of these.  She sees Dr. Ellyn Hack at [facility] routinely.   Says her Rx of Thyroid med ran out one week ago. At that time started using old bottle of 126mcg dose.   No other complaints/concerns today.    Review of Systems: Consitutional: No fever, chills,  night sweats, lymphadenopathy. Eyes: No visual changes, eye redness, or discharge. ENT/Mouth: No ear pain, sore throat, nasal drainage, or sinus pain. Cardiovascular: No chest pressure,heaviness, tightness or squeezing, even with exertion. No increased shortness of breath or dyspnea on exertion.No palpitations, edema, orthopnea, PND. Respiratory: No  Hemoptysis. Gastrointestinal: No anorexia, dysphagia, reflux, pain, nausea, vomiting, hematemesis, diarrhea, constipation, BRBPR, or melena. Breast: No mass, nodules, bulging, or retraction. No skin changes or inflammation. No nipple discharge. No lymphadenopathy. Genitourinary: No dysuria, hematuria, incontinence, vaginal discharge, pruritis, burning, abnormal bleeding, or pain. Musculoskeletal: No decreased ROM, No joint pain or swelling. No  significant pain in neck, back, or extremities. Skin: No rash, pruritis, or concerning lesions. Neurological: No headache, dizziness, syncope, seizures, tremors, memory loss, coordination problems, or paresthesias. Psychological: No depression, hallucinations, SI/HI. Endocrine: No polydipsia, polyphagia, polyuria, or known diabetes. No palpitations/rapid heart rate.  All other systems were  reviewed and are otherwise negative.  Past Medical History  Diagnosis Date  . Cataract     h/o bilat repair  . Asthma   . Depression   . Emphysema of lung   . Allergy   . Hypothyroidism   . CAD S/P percutaneous coronary angioplasty February 2007    Class III Angina --> Cath: 85% -- PCI 3.67mm x 108mm (4.0 mm) Cypher DES to RCA; Myoview October 2014: normal stress test: LOW RISK; mild anteroapical breast attenuation  . History of palpitations   . GERD (gastroesophageal reflux disease)   . Dyslipidemia   . Raynaud disease   . History of tobacco abuse   . Hypertension     well-controlled  . Osteoporosis   . Depression 11/29/2013     Past Surgical History  Procedure Laterality Date  . Abdominal hysterectomy      h/o partila hysterectomy  . Tonsillectomy  1959  . Breast biopsy  1988  . Coronary angioplasty with stent placement  10/14/2005    cypher DES (3.51mmx18mm) to high grade RCA lesion  . Transthoracic echocardiogram  05/22/2009    EF=>55%, normal LV systolic function; normal RV systolic function; mild MR/TR  . Nm myoview ltd  July 2012 of October 2014    '12: dipyridamole; Normal, low risk study; 2014 - normal stress test: LOW RISK; mild anteroapical breast attenuation    Home Meds:  Current Outpatient Prescriptions on File Prior to Visit  Medication Sig Dispense Refill  . albuterol (PROVENTIL HFA;VENTOLIN HFA) 108 (90 BASE) MCG/ACT inhaler Inhale 2 puffs into the lungs every 4 (four) hours as needed for wheezing or shortness of breath. 1 Inhaler 3  . atorvastatin (LIPITOR) 40 MG tablet TAKE 1 TABLET BY MOUTH EVERY DAY 30 tablet 11  . citalopram (CELEXA) 20 MG tablet TAKE 1 TABLET (20 MG TOTAL) BY MOUTH DAILY. 30 tablet 5  . clopidogrel (PLAVIX) 75 MG tablet Take 1 tablet (75 mg total) by mouth daily. 90 tablet 3  . estradiol (ESTRACE) 1 MG tablet TAKE 1 TABLET EVERY DAY 90 tablet 3  . Eszopiclone 3 MG TABS TAKE 1 TABLET BY MOUTH IMMEDIATELY BEFORE BEDTIME 30 tablet 2  .  levothyroxine (SYNTHROID, LEVOTHROID) 125 MCG tablet TAKE 1 TABLET (125 MCG TOTAL) BY MOUTH DAILY BEFORE BREAKFAST. 30 tablet 0  . nitroGLYCERIN (NITROSTAT) 0.4 MG SL tablet Place 1 tablet (0.4 mg total) under the tongue every 5 (five) minutes as needed for chest pain. 25 tablet 6  . pantoprazole (PROTONIX) 40 MG tablet TAKE 1 TABLET (40 MG TOTAL) BY MOUTH DAILY. 30 tablet 11  . SPIRIVA HANDIHALER 18 MCG inhalation capsule PLACE 1 CAPSULE (18 MCG TOTAL) INTO INHALER AND INHALE DAILY. 30 capsule 3   No current facility-administered medications on file prior to visit.    Allergies:  Allergies  Allergen Reactions  . Bystolic [Nebivolol Hcl] Hives  . Cephalexin Hives    History   Social History  . Marital Status: Widowed    Spouse Name: N/A  . Number of Children: 3  . Years of Education: 10th    Occupational History  . Retired    Social History Main Topics  . Smoking status: Current Every Day Smoker --  0.50 packs/day for 35 years    Types: Cigarettes  . Smokeless tobacco: Never Used  . Alcohol Use: No  . Drug Use: No  . Sexual Activity: Not on file   Other Topics Concern  . Not on file   Social History Narrative   She is a widowed mother of 34, grandmother for, great-grandmother 61.  She still smokes about half pack a day.  She doesn't have about 35 years.  She does not drink.   Prior to her symptoms coming on, she used to do all kind of walking around and working in the garden and other activities with her close friend.    Family History  Problem Relation Age of Onset  . Liver cancer Mother   . Heart attack Mother   . Heart Problems Sister     Physical Exam: Blood pressure 120/58, pulse 75, temperature 98 F (36.7 C), temperature source Oral, resp. rate 19, weight 176 lb (79.833 kg)., Body mass index is 33.27 kg/(m^2). General: Well developed, well nourished, WF Appears in no acute distress.  Neck: Supple. Trachea midline. No thyromegaly. Full ROM. No  lymphadenopathy.No Carotid Bruits. Lungs: Clear to auscultation bilaterally without wheezes, rales, or rhonchi. Breathing is of normal effort and unlabored. Cardiovascular: RRR with S1 S2. No murmurs, rubs, or gallops. Distal pulses 2+ symmetrically. No carotid or abdominal bruits. Abdomen: Soft, non-tender, non-distended with normoactive bowel sounds. No hepatosplenomegaly or masses. No rebound/guarding. No CVA tenderness. No hernias.  Musculoskeletal: Full range of motion and 5/5 strength throughout. Without swelling. Skin: Warm and moist without erythema, ecchymosis, wounds, or rash. Neuro: A+Ox3. CN II-XII grossly intact. Moves all extremities spontaneously.  Normal gait.  Psych:  Responds to questions appropriately with a normal affect.   Assessment/Plan:  69 y.o. y/o female here for    1. Bacterial respiratory infection - azithromycin (ZITHROMAX) 250 MG tablet; Day 1: Take 2 daily. Days 2-5: Take 1 daily.  Dispense: 6 tablet; Refill: 0 10 use this for Riva and the albuterol as they are already prescribed. Take the him Z-Pak as directed. Follow-up if cough does not go back to baseline and phlegm resolve.  2. Pulmonary emphysema, unspecified emphysema type  3. Tobacco abuse  4. Depression She is to wean off of the Celexa. Discussed this with her. Currently on 40 mg. She is to gradually weaning down to 20 mg and then will gradually switch over to the Cymbalta. Schedule follow-up office visit here 8 weeks. Follow-up with [place] sooner if she is having any difficulty with the weaning process or if symptoms worsen. - DULoxetine (CYMBALTA) 60 MG capsule; Take 1 capsule (60 mg total) by mouth daily.  Dispense: 30 capsule; Refill: 1  5. Generalized anxiety disorder See #4 above. - DULoxetine (CYMBALTA) 60 MG capsule; Take 1 capsule (60 mg total) by mouth daily.  Dispense: 30 capsule; Refill: 1  6. Insomnia This is controlled with the Lunesta. Continue Lunesta.    THE FOLLOWING IS  COPIED FORM OV NOTE 09/19/2014:    Osteoporosis See Section-D  Under Preventive Care for DETAILS Fosamax Rxed at CPE 11/29/13 Is to take calcium and vitamin D as directed. Also at tha [place] she Planned to start an exercise program so weightbearing exercise will help with this as well.    Essential hypertension - COMPLETE METABOLIC PANEL WITH GFR   Tobacco abuse  Pulmonary emphysema, unspecified emphysema type   COPD (chronic obstructive pulmonary disease) On Spiriva. Stopped Symbicort secondary to cost.    5. Other  specified hypothyroidism - TSH  6. Dyslipidemia  7. CAD S/P percutaneous coronary angioplasty  8. Obesity (BMI 30-39.9)  9. Claudication of left lower extremity  10. Raynaud's disease  THE FOLLOWING IS COPIED FROM CPE NOTE 11/29/2013:   Visit for preventive health examination  A. Screening Labs: Patient came and had fasting labs 11/27/13.  TSH is significantly elevated at 32.163. I reviewed this with her. Also reviewed prior recent TSHs.  10 months ago TSH was 0.901.    11 months ago TSH was 1.959. I asked her  multiple times, but she repeatedly states that she knows that she definitely is taking her thyroid pill daily and that this has not accidentally run out. As well she says that she has had significant weight gain just in the last few months and says that she has been  eating the same diet and doing the same amount of activity and exercise as always. Also says that she is having significant increased fatigue in the last few months. See # 7 below.  Will  increase dose and then get recheck TSH in 6 weeks.  CMET, FLP, CBC all normal. FLP at goal.  B. Pap: She has a history of partial hysterectomy. This was not performed secondary to any cancer. Therefore she has no indication for further Pap smears. However, I did discuss with her today that she still needs to have annual pelvic exams. However she defers. Aware of risk versus benefit but still defers.  C.  Screening Mammogram: She states that she has had mammogram in the past 12 months and this was negative.  D. DEXA/BMD:  Last DEXA was performed 02/15/2012. She was informed that this showed osteoporosis in the hip and the spine. She was prescribed Fosamax 70 mg once a week to take on an empty stomach separate from other medicines. She reports that she took that for a couple of months but then stopped it. Says she had a hard time remembering to take it once a week. Says she just decided to stop it but no other good reason. Was having no adverse effects. Discussed need for medication and she is agreeable to restart this. Says that she does have a new type cellphone and can set alarm clock on this for once a week.  E. Colorectal Cancer Screening: I had discussed this at her visit here 11/2011.  At that time she reported that this was performed approximately 2010 at [facility] and it was normal.  However according to the health maintenance section of Epic, this was performed 08/31/2005. Repeat 2017.  F. Immunizations:  Influenza: N/A Tetanus:  Tdap given here 12/23/2011 Pneumococcal:  Pneumovax 23 was given here 12/23/2011.   I discussed getting the Prevnar 13 today and she is agreeable to do so. Zostavax:  She received Zostavax 01/17/2013   Return for regular office visit in 6 months or sooner if needed.  Marin Olp [place], [place], [facility] 01/21/2015 2:51 PM

## 2015-01-23 ENCOUNTER — Other Ambulatory Visit: Payer: Self-pay | Admitting: Physician Assistant

## 2015-01-24 NOTE — Telephone Encounter (Signed)
Pt needs to come in for future labs.  LMTCB

## 2015-01-25 ENCOUNTER — Telehealth: Payer: Self-pay | Admitting: Physician Assistant

## 2015-01-25 NOTE — Telephone Encounter (Signed)
Spoke to patient.  Told needs to return for FLP and TSH.  Says she will come next week.  Has enough medication through next week

## 2015-01-25 NOTE — Telephone Encounter (Signed)
[phone] PT left VM rtn your call about labs

## 2015-01-30 ENCOUNTER — Other Ambulatory Visit: Payer: Medicare Other

## 2015-01-30 ENCOUNTER — Other Ambulatory Visit: Payer: Self-pay | Admitting: Physician Assistant

## 2015-01-30 DIAGNOSIS — E785 Hyperlipidemia, unspecified: Secondary | ICD-10-CM

## 2015-01-30 DIAGNOSIS — Z79899 Other long term (current) drug therapy: Secondary | ICD-10-CM

## 2015-01-30 DIAGNOSIS — E039 Hypothyroidism, unspecified: Secondary | ICD-10-CM

## 2015-01-30 LAB — TSH: TSH: 0.013 u[IU]/mL — ABNORMAL LOW (ref 0.350–4.500)

## 2015-01-31 LAB — LIPID PANEL
Cholesterol: 133 mg/dL (ref 0–200)
HDL: 61 mg/dL
LDL Cholesterol: 57 mg/dL (ref 0–99)
Total CHOL/HDL Ratio: 2.2 ratio
Triglycerides: 75 mg/dL
VLDL: 15 mg/dL (ref 0–40)

## 2015-02-02 LAB — HEPATIC FUNCTION PANEL
ALBUMIN: 3.5 g/dL (ref 3.5–5.2)
ALT: 19 U/L (ref 0–35)
AST: 18 U/L (ref 0–37)
Alkaline Phosphatase: 82 U/L (ref 39–117)
Bilirubin, Direct: 0.1 mg/dL (ref 0.0–0.3)
Indirect Bilirubin: 0.4 mg/dL (ref 0.2–1.2)
Total Bilirubin: 0.5 mg/dL (ref 0.2–1.2)
Total Protein: 6.4 g/dL (ref 6.0–8.3)

## 2015-02-04 ENCOUNTER — Other Ambulatory Visit: Payer: Self-pay | Admitting: Physician Assistant

## 2015-02-06 ENCOUNTER — Telehealth: Payer: Self-pay | Admitting: Physician Assistant

## 2015-02-06 NOTE — Telephone Encounter (Signed)
Very unlikely rash is coming from Cymbalta if it is only on her chest. Is the rash itchy ? I would recommend continuing the Cymbalta but if the rash persist, then she is going to need to come in for an office visit for [place] to look at it--sorry.

## 2015-02-06 NOTE — Telephone Encounter (Signed)
Patient has a rash on her chest and wants to know if its possible that the cymbalta she is taking could be doing this  [phone]

## 2015-02-07 NOTE — Telephone Encounter (Signed)
Lm with pt husband to have pt to return my call

## 2015-02-10 ENCOUNTER — Other Ambulatory Visit: Payer: Self-pay | Admitting: Physician Assistant

## 2015-02-11 ENCOUNTER — Other Ambulatory Visit: Payer: Self-pay | Admitting: Physician Assistant

## 2015-02-11 NOTE — Telephone Encounter (Signed)
Refill denied.   Patient was to wean off Celexa 40mg  down to 20mg  then stop.   Patient was given Cymbalta.

## 2015-02-11 NOTE — Telephone Encounter (Signed)
Ok to refill 

## 2015-02-11 NOTE — Telephone Encounter (Signed)
Refill appropriate and filled per protocol. 

## 2015-02-14 ENCOUNTER — Telehealth: Payer: Self-pay | Admitting: *Deleted

## 2015-02-14 NOTE — Telephone Encounter (Signed)
Received call from patient.   Reports that she has had labs obtained on 01/30/2015 (TSH) but has not received results. Also states that she is attempting to refill prescription for Synthroid, but she has not gotten refill.   TSH noted decreased on labs from 01/30/2015. Patient prescription is for Synthroid 156mcg.   Please advise.

## 2015-02-14 NOTE — Telephone Encounter (Signed)
Thyroid dose currently 125 g daily. Need to decrease to 112 g daily. Please remove the 125 g dose Please order to 112 g daily dose for #30+1 refill.  Order TSH future and tell patient to recheck this in 6 weeks.

## 2015-02-15 ENCOUNTER — Other Ambulatory Visit: Payer: Self-pay | Admitting: *Deleted

## 2015-02-15 DIAGNOSIS — E039 Hypothyroidism, unspecified: Secondary | ICD-10-CM

## 2015-02-15 MED ORDER — LEVOTHYROXINE SODIUM 112 MCG PO TABS: 112.0000 ug | ORAL_TABLET | Freq: Every day | ORAL | Status: DC

## 2015-02-15 NOTE — Telephone Encounter (Signed)
Prescription sent to pharmacy.   Lab orders placed.   Call placed to patient and patient made aware.

## 2015-03-25 ENCOUNTER — Other Ambulatory Visit: Payer: Self-pay | Admitting: Physician Assistant

## 2015-03-25 ENCOUNTER — Other Ambulatory Visit: Payer: Self-pay | Admitting: Family Medicine

## 2015-03-25 ENCOUNTER — Encounter: Payer: Self-pay | Admitting: Family Medicine

## 2015-03-25 MED ORDER — DULOXETINE HCL 60 MG PO CPEP: 60.0000 mg | ORAL_CAPSULE | Freq: Every day | ORAL | Status: DC

## 2015-03-25 NOTE — Telephone Encounter (Signed)
Medication refill for one time only.  Patient needs to be seen.  Letter sent for patient to call and schedule 

## 2015-03-25 NOTE — Telephone Encounter (Signed)
90 day supply given

## 2015-04-06 ENCOUNTER — Other Ambulatory Visit: Payer: Self-pay | Admitting: Physician Assistant

## 2015-04-08 NOTE — Telephone Encounter (Signed)
Medication filled x1 with no refills.   Requires office visit before any further refills can be given.   Letter sent.  

## 2015-04-11 ENCOUNTER — Other Ambulatory Visit: Payer: Self-pay | Admitting: Physician Assistant

## 2015-04-11 MED ORDER — LEVOTHYROXINE SODIUM 100 MCG PO TABS: 100.0000 ug | ORAL_TABLET | Freq: Every day | ORAL | Status: DC

## 2015-04-11 NOTE — Telephone Encounter (Signed)
Refill for Levothyroxine 133mcg denied.   New dosage sent to pharmacy.   Call placed to patient. [place].   Future lab order placed.

## 2015-04-11 NOTE — Telephone Encounter (Signed)
Last TDH 01/30/2015 noted 0.013 (L).   Please advise.

## 2015-04-11 NOTE — Telephone Encounter (Signed)
Margreta Journey, Thanks for realizing this and letting me know.  Tell patient that we need to decrease the dose slightly. Then recheck labs 6 weeks. Decrease levothyroxine to 100 g daily. Place future order for TSH

## 2015-04-12 NOTE — Telephone Encounter (Signed)
Pt made aware of medication adjustment an need to repeat level in 6 weeks

## 2015-04-15 ENCOUNTER — Other Ambulatory Visit: Payer: Self-pay | Admitting: Physician Assistant

## 2015-04-16 NOTE — Telephone Encounter (Signed)
Ok to refill??  Last office visit 01/21/2015.  Last refill 01/14/2015, #2 refills.

## 2015-04-17 NOTE — Telephone Encounter (Signed)
Approved. #30+2. 

## 2015-04-18 NOTE — Telephone Encounter (Signed)
Medication refilled per protocol. 

## 2015-05-13 ENCOUNTER — Other Ambulatory Visit: Payer: Self-pay | Admitting: Physician Assistant

## 2015-05-13 NOTE — Telephone Encounter (Signed)
Medication refilled per protocol. 

## 2015-06-10 ENCOUNTER — Other Ambulatory Visit: Payer: Self-pay | Admitting: Physician Assistant

## 2015-06-10 ENCOUNTER — Encounter: Payer: Self-pay | Admitting: Family Medicine

## 2015-06-10 NOTE — Telephone Encounter (Signed)
Medication refill for one time only.  Patient needs to be seen.  Letter sent for patient to call and schedule 

## 2015-06-12 ENCOUNTER — Other Ambulatory Visit: Payer: Self-pay | Admitting: Physician Assistant

## 2015-06-12 NOTE — Telephone Encounter (Signed)
Refill appropriate and filled per protocol. 

## 2015-06-14 ENCOUNTER — Telehealth: Payer: Self-pay | Admitting: Family Medicine

## 2015-06-14 MED ORDER — PANTOPRAZOLE SODIUM 40 MG PO TBEC
40.0000 mg | DELAYED_RELEASE_TABLET | Freq: Every day | ORAL | Status: DC
Start: 2015-06-14 — End: 2015-06-20

## 2015-06-14 NOTE — Telephone Encounter (Signed)
Medication refilled per protocol. 

## 2015-06-19 ENCOUNTER — Telehealth: Payer: Self-pay | Admitting: Cardiology

## 2015-06-19 ENCOUNTER — Other Ambulatory Visit: Payer: Self-pay

## 2015-06-19 DIAGNOSIS — Z1231 Encounter for screening mammogram for malignant neoplasm of breast: Secondary | ICD-10-CM

## 2015-06-19 NOTE — Telephone Encounter (Signed)
She wants to know if you received the forms she dropped off yesterday? She need you to fax this back asap,so she can have her dental work please.

## 2015-06-19 NOTE — Telephone Encounter (Signed)
Informed patient  Dr Ellyn Hack REVIEWED FORM AN FILLED OUT AND SIGNED.  CLEARANCE FORM COMPLETED AND FAXED. PATIENT AWARE- TO HOLD PLAVIX FOR 5 DAYS PRIOR TO EXTRACTIONS PATIENT VERBALIZED UNDERSTANDING.

## 2015-06-20 ENCOUNTER — Encounter: Payer: Self-pay | Admitting: Physician Assistant

## 2015-06-20 ENCOUNTER — Ambulatory Visit (INDEPENDENT_AMBULATORY_CARE_PROVIDER_SITE_OTHER): Payer: Medicare Other | Admitting: Physician Assistant

## 2015-06-20 ENCOUNTER — Other Ambulatory Visit: Payer: Self-pay | Admitting: Physician Assistant

## 2015-06-20 VITALS — BP 124/72 | HR 60 | Temp 98.6°F | Resp 18

## 2015-06-20 DIAGNOSIS — I739 Peripheral vascular disease, unspecified: Secondary | ICD-10-CM | POA: Diagnosis not present

## 2015-06-20 DIAGNOSIS — J439 Emphysema, unspecified: Secondary | ICD-10-CM | POA: Diagnosis not present

## 2015-06-20 DIAGNOSIS — E785 Hyperlipidemia, unspecified: Secondary | ICD-10-CM

## 2015-06-20 DIAGNOSIS — E669 Obesity, unspecified: Secondary | ICD-10-CM

## 2015-06-20 DIAGNOSIS — I208 Other forms of angina pectoris: Secondary | ICD-10-CM

## 2015-06-20 DIAGNOSIS — E038 Other specified hypothyroidism: Secondary | ICD-10-CM | POA: Diagnosis not present

## 2015-06-20 DIAGNOSIS — F411 Generalized anxiety disorder: Secondary | ICD-10-CM | POA: Diagnosis not present

## 2015-06-20 DIAGNOSIS — F329 Major depressive disorder, single episode, unspecified: Secondary | ICD-10-CM

## 2015-06-20 DIAGNOSIS — M810 Age-related osteoporosis without current pathological fracture: Secondary | ICD-10-CM | POA: Diagnosis not present

## 2015-06-20 DIAGNOSIS — Z23 Encounter for immunization: Secondary | ICD-10-CM

## 2015-06-20 DIAGNOSIS — Z9861 Coronary angioplasty status: Secondary | ICD-10-CM

## 2015-06-20 DIAGNOSIS — G4700 Insomnia, unspecified: Secondary | ICD-10-CM | POA: Diagnosis not present

## 2015-06-20 DIAGNOSIS — I10 Essential (primary) hypertension: Secondary | ICD-10-CM | POA: Diagnosis not present

## 2015-06-20 DIAGNOSIS — I2510 Atherosclerotic heart disease of native coronary artery without angina pectoris: Secondary | ICD-10-CM | POA: Diagnosis not present

## 2015-06-20 DIAGNOSIS — Z720 Tobacco use: Secondary | ICD-10-CM | POA: Diagnosis not present

## 2015-06-20 DIAGNOSIS — I7300 Raynaud's syndrome without gangrene: Secondary | ICD-10-CM

## 2015-06-20 DIAGNOSIS — F32A Depression, unspecified: Secondary | ICD-10-CM

## 2015-06-20 LAB — COMPLETE METABOLIC PANEL WITH GFR
ALBUMIN: 3.8 g/dL (ref 3.6–5.1)
ALK PHOS: 97 U/L (ref 33–130)
ALT: 37 U/L — AB (ref 6–29)
AST: 30 U/L (ref 10–35)
BILIRUBIN TOTAL: 0.4 mg/dL (ref 0.2–1.2)
BUN: 18 mg/dL (ref 7–25)
CALCIUM: 9.1 mg/dL (ref 8.6–10.4)
CHLORIDE: 106 mmol/L (ref 98–110)
CO2: 27 mmol/L (ref 20–31)
CREATININE: 0.61 mg/dL (ref 0.50–0.99)
GFR, Est Non African American: >89 mL/min (ref >=60)
Glucose, Bld: 89 mg/dL (ref 70–99)
Potassium: 4.6 mmol/L (ref 3.5–5.3)
Sodium: 140 mmol/L (ref 135–146)
TOTAL PROTEIN: 6.6 g/dL (ref 6.1–8.1)

## 2015-06-20 LAB — LIPID PANEL
CHOLESTEROL: 162 mg/dL (ref 125–200)
HDL: 66 mg/dL (ref >=46)
LDL Cholesterol: 80 mg/dL (ref <130)
TRIGLYCERIDES: 80 mg/dL (ref <150)
Total CHOL/HDL Ratio: 2.5 Ratio (ref <=5.0)
VLDL: 16 mg/dL (ref <30)

## 2015-06-20 MED ORDER — ALBUTEROL SULFATE HFA 108 (90 BASE) MCG/ACT IN AERS: 2.0000 | INHALATION_SPRAY | RESPIRATORY_TRACT | Status: DC | PRN

## 2015-06-20 MED ORDER — PANTOPRAZOLE SODIUM 40 MG PO TBEC: 40.0000 mg | DELAYED_RELEASE_TABLET | Freq: Every day | ORAL | Status: DC

## 2015-06-20 MED ORDER — TIOTROPIUM BROMIDE MONOHYDRATE 18 MCG IN CAPS: ORAL_CAPSULE | RESPIRATORY_TRACT | Status: DC

## 2015-06-20 MED ORDER — DIAZEPAM 5 MG PO TABS: 5.0000 mg | ORAL_TABLET | Freq: Two times a day (BID) | ORAL | Status: DC | PRN

## 2015-06-20 MED ORDER — LEVOTHYROXINE SODIUM 100 MCG PO TABS: 100.0000 ug | ORAL_TABLET | Freq: Every day | ORAL | Status: DC

## 2015-06-20 NOTE — Progress Notes (Signed)
Patient ID: Alexa Orozco MRN: [id number], DOB: [birth year], 69 y.o. Date of Encounter: 06/20/2015,   Chief Complaint: Cough, Anxiety  HPI: 69 y.o. y/o female    AT OV 09/19/2014:  She reported that she sometimes feels anxious, stressed. Said she was not feeling depressed.  Said she has been having trouble with one of her children.  Said she does not do well when she is around a lot of people.  Said "things upset her more than they used to."   Also, I noticed that she yawned throughout that visit. She said she had no problem falling asleep but never stayed asleep. Frequently was waking around 4 or 5 am.  Alexa Orozco in past Lunesta worked well but was discontinued sec to cost. Alexa Orozco she  had different insurance now.  At that [place] 09/19/2014--- Increased Celexa from 20-40 mg. I also prescribed Lunesta 3 mg daily at bedtime when necessary.  At OV-01/21/15--- She says that the Alexa Orozco is working great. She is able to afford it with her current insurance.  However,  she states that her "nerves are really bad". " Feels shaky. Sometimes feels a little bit panicky. At times, can cry. Says this has been going on for several months.  Says that some days she feels like she is just going through the motions but doesn't feel really "there".  Says that things definitely are not any better since we increased the Celexa in January.  Asked how much of this may be because of things going on in her life. She says that things really are probably about the same as far as external factors go. She says that this situation with that one child is about the same. Is that she feels like multiple people need her help and she feels like she is falling apart. Says that overall the external situation is about the same as far as same amount of stressors etc. But feels like she is even more shaky and nervous than usual. Only medications she thinks that she is ever used for this are the Celexa and she also used Wellbutrin in the  past.  At that office visit 01/21/15-----weaned off of Celexa and started Cymbalta.   AT OV 09/19/2014 she had also reported: Regarding her COPD, she states that she is using Spiriva. She quit the Symbicort secondary to cost. States that both the Spiriva and the Symbicort are expensive and are especially expensive when trying to pay for both of these.   06/20/2015:  She states that the Cymbalta is working well for her. Says that that part is "doing good" She states that next week she is to have all 12 of her teeth pulled. Says that a family member is going to be driving her to the appointment and being with her and taking her back home. She is requesting a medication that she could take prior to that appointment to help her relax. Today when I was reviewing her preventive care and updating that, we were discussing her bone density results-- she says that she has been off of Fosamax for the past 6 months. I told her to stay off of that for now secondary to risk of osteonecrosis of the jaw and her dental procedures. At her next visit, we may readdress that as far as having her discuss with her dentist risk versus benefits of taking that medication, given her dental status at that point. She is taking her thyroid medication daily. Having no palpitations. Has noticed no significant change  in weight or energy level, hair, or skin. She is taking her Spiriva daily. Her breathing is stable and controlled and is not having to use her albuterol frequently. She does have to take Protonix for her GERD but this does control her symptoms. She is taking Lipitor as directed. No myalgias or other adverse effects.    She sees Dr. Ellyn Hack at [facility] routinely.   No other complaints/concerns today.    Review of Systems: Consitutional: No fever, chills,  night sweats, lymphadenopathy. Eyes: No visual changes, eye redness, or discharge. ENT/Mouth: No ear pain, sore throat, nasal drainage, or sinus  pain. Cardiovascular: No chest pressure,heaviness, tightness or squeezing, even with exertion. No increased shortness of breath or dyspnea on exertion.No palpitations, edema, orthopnea, PND. Respiratory: No  Hemoptysis. Gastrointestinal: No anorexia, dysphagia, reflux, pain, nausea, vomiting, hematemesis, diarrhea, constipation, BRBPR, or melena. Breast: No mass, nodules, bulging, or retraction. No skin changes or inflammation. No nipple discharge. No lymphadenopathy. Genitourinary: No dysuria, hematuria, incontinence, vaginal discharge, pruritis, burning, abnormal bleeding, or pain. Musculoskeletal: No decreased ROM, No joint pain or swelling. No significant pain in neck, back, or extremities. Skin: No rash, pruritis, or concerning lesions. Neurological: No headache, dizziness, syncope, seizures, tremors, memory loss, coordination problems, or paresthesias. Psychological: No depression, hallucinations, SI/HI. Endocrine: No polydipsia, polyphagia, polyuria, or known diabetes. No palpitations/rapid heart rate.  All other systems were reviewed and are otherwise negative.  Past Medical History  Diagnosis Date  . Cataract     h/o bilat repair  . Asthma   . Depression   . Emphysema of lung ([place])   . Allergy   . Hypothyroidism   . CAD S/P percutaneous coronary angioplasty February 2007    Class III Angina --> Cath: 85% -- PCI 3.60mm x 102mm (4.0 mm) Cypher DES to RCA; Myoview October 2014: normal stress test: LOW RISK; mild anteroapical breast attenuation  . History of palpitations   . GERD (gastroesophageal reflux disease)   . Dyslipidemia   . Raynaud disease   . History of tobacco abuse   . Hypertension     well-controlled  . Osteoporosis   . Depression 11/29/2013     Past Surgical History  Procedure Laterality Date  . Abdominal hysterectomy      h/o partila hysterectomy  . Tonsillectomy  1959  . Breast biopsy  1988  . Coronary angioplasty with stent placement  10/14/2005    cypher  DES (3.37mmx18mm) to high grade RCA lesion  . Transthoracic echocardiogram  05/22/2009    EF=>55%, normal LV systolic function; normal RV systolic function; mild MR/TR  . Nm myoview ltd  July 2012 of October 2014    '12: dipyridamole; Normal, low risk study; 2014 - normal stress test: LOW RISK; mild anteroapical breast attenuation    Home Meds:  Current Outpatient Prescriptions on File Prior to Visit  Medication Sig Dispense Refill  . albuterol (PROVENTIL HFA;VENTOLIN HFA) 108 (90 BASE) MCG/ACT inhaler Inhale 2 puffs into the lungs every 4 (four) hours as needed for wheezing or shortness of breath. 1 Inhaler 3  . atorvastatin (LIPITOR) 40 MG tablet TAKE 1 TABLET BY MOUTH EVERY DAY 30 tablet 11  . clopidogrel (PLAVIX) 75 MG tablet Take 1 tablet (75 mg total) by mouth daily. 90 tablet 3  . DULoxetine (CYMBALTA) 60 MG capsule Take 1 capsule (60 mg total) by mouth daily. 90 capsule 0  . estradiol (ESTRACE) 1 MG tablet TAKE 1 TABLET BY MOUTH DAILY 90 tablet 0  . Eszopiclone 3  MG TABS TAKE 1 TABLET AT BEDTIME AS NEEDED 30 tablet 2  . levothyroxine (SYNTHROID, LEVOTHROID) 100 MCG tablet TAKE 1 TABLET EVERY DAY 30 tablet 0  . levothyroxine (SYNTHROID, LEVOTHROID) 100 MCG tablet TAKE 1 TABLET EVERY DAY 30 tablet 1  . nitroGLYCERIN (NITROSTAT) 0.4 MG SL tablet Place 1 tablet (0.4 mg total) under the tongue every 5 (five) minutes as needed for chest pain. 25 tablet 6  . pantoprazole (PROTONIX) 40 MG tablet Take 1 tablet (40 mg total) by mouth daily. 30 tablet 0  . SPIRIVA HANDIHALER 18 MCG inhalation capsule PLACE 1 CAPSULE (18 MCG TOTAL) INTO INHALER AND INHALE DAILY. 30 capsule 3   No current facility-administered medications on file prior to visit.    Allergies:  Allergies  Allergen Reactions  . Bystolic [Nebivolol Hcl] Hives  . Cephalexin Hives    Social History   Social History  . Marital Status: Widowed    Spouse Name: N/A  . Number of Children: 3  . Years of Education: 10th     Occupational History  . Retired    Social History Main Topics  . Smoking status: Current Every Day Smoker -- 0.50 packs/day for 35 years    Types: Cigarettes  . Smokeless tobacco: Never Used  . Alcohol Use: No  . Drug Use: No  . Sexual Activity: Not on file   Other Topics Concern  . Not on file   Social History Narrative   She is a widowed mother of 2, grandmother for, great-grandmother 83.  She still smokes about half pack a day.  She doesn't have about 35 years.  She does not drink.   Prior to her symptoms coming on, she used to do all kind of walking around and working in the garden and other activities with her close friend.    Family History  Problem Relation Age of Onset  . Liver cancer Mother   . Heart attack Mother   . Heart Problems Sister     Physical Exam: Blood pressure 124/72, pulse 60, temperature 98.6 F (37 C), temperature source Oral, resp. rate 18., There is no weight on file to calculate BMI. General: Well developed, well nourished, WF Appears in no acute distress.  Neck: Supple. Trachea midline. No thyromegaly. Full ROM. No lymphadenopathy.No Carotid Bruits. Lungs: Clear to auscultation bilaterally without wheezes, rales, or rhonchi. Breathing is of normal effort and unlabored. Cardiovascular: RRR with S1 S2. No murmurs, rubs, or gallops. Distal pulses 2+ symmetrically. No carotid or abdominal bruits. Abdomen: Soft, non-tender, non-distended with normoactive bowel sounds. No hepatosplenomegaly or masses. No rebound/guarding. No CVA tenderness. No hernias.  Musculoskeletal: Full range of motion and 5/5 strength throughout. Without swelling. Skin: Warm and moist without erythema, ecchymosis, wounds, or rash. Neuro: A+Ox3. CN II-XII grossly intact. Moves all extremities spontaneously.  Normal gait.  Psych:  Responds to questions appropriately with a normal affect.   Assessment/Plan:  69 y.o. y/o female here for  For Upcoming Dental Procedure----Printed Rx  for Valium 5mg     CAD S/P percutaneous coronary angioplasty ---Per Cardiology   Essential hypertension Blood pressure is controlled and at goal. Continue current medications. Check lab to monitor. - COMPLETE METABOLIC PANEL WITH GFR  Dyslipidemia She is on Lipitor 40 mg. Check labs to monitor. - COMPLETE METABOLIC PANEL WITH GFR - Lipid panel  Obesity (BMI 30-39.9)  Claudication of left lower extremity [facility]) ---Per Cardiology.  Other specified hypothyroidism She is taking her thyroid supplement/Rx. Check lab to monitor. - TSH  Pulmonary emphysema, unspecified emphysema type This is stable and controlled. She takes Spiriva daily.  Tobacco abuse She is well aware of need for smoking cessation but has been unable to quit.  Depression This is now stable and controlled with Cymbalta. Continue Cymbalta 60 mg daily.  Generalized anxiety disorder This is now stable and controlled with Cymbalta. Continue Cymbalta 60 mg daily.  Insomnia This is controlled with the Lunesta. Continue Lunesta.  Osteoporosis At [place] 06/20/15 she reports that she has been off of Fosamax for about 6 months. Told her to stay off of this for now given her upcoming dental procedure. Next visit may readdress this and have her discuss risks versus benefits of medication with her dentist to get his input regarding whether to restart this. For further information regarding the osteoporosis, see preventive care section below--- regarding last DEXA scan etc.  THE FOLLOWING IS COPIED FROM CPE NOTE 11/29/2013:   Visit for preventive health examination  A. Screening Labs: Patient came and had fasting labs 11/27/13.  TSH is significantly elevated at 32.163. I reviewed this with her. Also reviewed prior recent TSHs.  10 months ago TSH was 0.901.    11 months ago TSH was 1.959. I asked her  multiple times, but she repeatedly states that she knows that she definitely is taking her thyroid pill daily and that this has  not accidentally run out. As well she says that she has had significant weight gain just in the last few months and says that she has been  eating the same diet and doing the same amount of activity and exercise as always. Also says that she is having significant increased fatigue in the last few months. See # 7 below.  Will  increase dose and then get recheck TSH in 6 weeks.  CMET, FLP, CBC all normal. FLP at goal.  B. Pap: She has a history of partial hysterectomy. This was not performed secondary to any cancer. Therefore she has no indication for further Pap smears. However, I did discuss with her today that she still needs to have annual pelvic exams. However she defers. Aware of risk versus benefit but still defers.  C. Screening Mammogram: She states that she has had mammogram in the past 12 months and this was negative. At [place] 06/20/15 discussed this and she says that she has been keeping up with doing mammograms annually and she has one scheduled for this November-- 2016  D. DEXA/BMD:  Last DEXA was performed 02/15/2012. She was informed that this showed osteoporosis in the hip and the spine. She was prescribed Fosamax 70 mg once a week to take on an empty stomach separate from other medicines. At [place] 11/29/2013-- She reported that she took that for a couple of months but then stopped it. Said she had a hard time remembering to take it once a week. Says she just decided to stop it but no other good reason. Was having no adverse effects. At F/U OV--discussed need for medication and she is agreeable to restart this. Said that she does have a new type cellphone and can set alarm clock on this for once a week. However at visit 06/20/15 she reported that she had stopped the Fosamax about 6 months ago. At that visit 06/20/15 I told her to stay off of it for now given that she is having all of her teeth pulled this upcoming week. At the next office visit I may discuss with her getting her dentist  input regarding  risk versus benefits of restarting Fosamax given possible risk of osteonecrosis and get his input about that. She is to stay off of Fosamax at this point until we have that follow-up.  E. Colorectal Cancer Screening: I had discussed this at her visit here 11/2011.  At that time she reported that this was performed approximately 2010 at [facility] and it was normal.  However according to the health maintenance section of Epic, this was performed 08/31/2005. Repeat 2017.  F. Immunizations:  Influenza: Given here 06/20/2015 Tetanus:  Tdap given here 12/23/2011 Pneumococcal:  Pneumovax 23 was given here 12/23/2011.   I discussed getting the Prevnar 13 today and she is agreeable to do so. Zostavax:  She received Zostavax 01/17/2013   Return for regular office visit in 6 months or sooner if needed.  Signed, [address] [place], [place], [facility] 06/20/2015 9:11 AM

## 2015-06-20 NOTE — Telephone Encounter (Signed)
Refill appropriate and filled per protocol. 

## 2015-06-21 LAB — TSH: TSH: 0.03 u[IU]/mL — ABNORMAL LOW (ref 0.350–4.500)

## 2015-06-24 ENCOUNTER — Other Ambulatory Visit: Payer: Self-pay | Admitting: Family Medicine

## 2015-06-24 DIAGNOSIS — E039 Hypothyroidism, unspecified: Secondary | ICD-10-CM

## 2015-06-24 MED ORDER — LEVOTHYROXINE SODIUM 88 MCG PO TABS: 88.0000 ug | ORAL_TABLET | Freq: Every day | ORAL | Status: DC

## 2015-07-22 ENCOUNTER — Ambulatory Visit: Payer: 59

## 2015-07-29 ENCOUNTER — Other Ambulatory Visit: Payer: Self-pay | Admitting: Physician Assistant

## 2015-07-29 NOTE — Telephone Encounter (Signed)
Approved. #30+2. 

## 2015-07-29 NOTE — Telephone Encounter (Signed)
LRF 04/18/15 #30 + 2.  LOV 06/20/15  OK refill?

## 2015-07-29 NOTE — Telephone Encounter (Signed)
RX called in .

## 2015-08-02 ENCOUNTER — Other Ambulatory Visit: Payer: Self-pay | Admitting: Cardiology

## 2015-08-02 NOTE — Telephone Encounter (Signed)
Rx request sent to pharmacy.  

## 2015-08-07 ENCOUNTER — Ambulatory Visit: Payer: 59 | Admitting: Podiatry

## 2015-08-12 ENCOUNTER — Ambulatory Visit (INDEPENDENT_AMBULATORY_CARE_PROVIDER_SITE_OTHER): Payer: Medicare Other | Admitting: Physician Assistant

## 2015-08-12 ENCOUNTER — Encounter: Payer: Self-pay | Admitting: Physician Assistant

## 2015-08-12 VITALS — BP 122/78 | HR 60 | Temp 98.1°F | Resp 18 | Wt 174.0 lb

## 2015-08-12 DIAGNOSIS — I208 Other forms of angina pectoris: Secondary | ICD-10-CM | POA: Diagnosis not present

## 2015-08-12 DIAGNOSIS — J988 Other specified respiratory disorders: Secondary | ICD-10-CM

## 2015-08-12 DIAGNOSIS — B9689 Other specified bacterial agents as the cause of diseases classified elsewhere: Principal | ICD-10-CM

## 2015-08-12 MED ORDER — AZITHROMYCIN 250 MG PO TABS: ORAL_TABLET | ORAL | Status: DC

## 2015-08-12 NOTE — Progress Notes (Signed)
Patient ID: Alexa Orozco MRN: [id number], DOB: 24-Dec-1945, 69 y.o. Date of Encounter: 08/12/2015, 11:12 AM    Chief Complaint:  Chief Complaint  Patient presents with  . sick x 2 days    cough, cold, scratchy throat  . wants handicapped sticker     HPI: 69 y.o. year old white female resents with above. He is holding a tissue in her hand during visit. Says that she has been blowing mucus which is thick and dark. Also has a lot of cough. Uses her Spiriva as directed but also recently has had to use her albuterol inhaler some. Throat has been a little scratchy but has had no significant sore throat. No fevers or chills. Has had handicap sticker in the past and is wanting to re-start that. Has history of CAD and COPD.     Home Meds:   Outpatient Prescriptions Prior to Visit  Medication Sig Dispense Refill  . albuterol (PROVENTIL HFA;VENTOLIN HFA) 108 (90 BASE) MCG/ACT inhaler Inhale 2 puffs into the lungs every 4 (four) hours as needed for wheezing or shortness of breath. 1 Inhaler 3  . atorvastatin (LIPITOR) 40 MG tablet TAKE 1 TABLET BY MOUTH EVERY DAY 30 tablet 0  . clopidogrel (PLAVIX) 75 MG tablet Take 1 tablet (75 mg total) by mouth daily. 90 tablet 3  . DULoxetine (CYMBALTA) 60 MG capsule TAKE 1 CAPSULE (60 MG TOTAL) BY MOUTH DAILY. 90 capsule 3  . estradiol (ESTRACE) 1 MG tablet TAKE 1 TABLET BY MOUTH DAILY 90 tablet 0  . Eszopiclone 3 MG TABS TAKE 1 TABLET AT BEDTIME AS NEEDED 30 tablet 2  . levothyroxine (SYNTHROID, LEVOTHROID) 88 MCG tablet Take 1 tablet (88 mcg total) by mouth daily. 90 tablet 0  . nitroGLYCERIN (NITROSTAT) 0.4 MG SL tablet Place 1 tablet (0.4 mg total) under the tongue every 5 (five) minutes as needed for chest pain. 25 tablet 6  . pantoprazole (PROTONIX) 40 MG tablet Take 1 tablet (40 mg total) by mouth daily. 30 tablet 5  . tiotropium (SPIRIVA HANDIHALER) 18 MCG inhalation capsule PLACE 1 CAPSULE (18 MCG TOTAL) INTO INHALER AND INHALE DAILY. 30 capsule 5    . diazepam (VALIUM) 5 MG tablet Take 1 tablet (5 mg total) by mouth every 12 (twelve) hours as needed for anxiety. (Patient not taking: Reported on 08/12/2015) 3 tablet 0   No facility-administered medications prior to visit.    Allergies:  Allergies  Allergen Reactions  . Bystolic [Nebivolol Hcl] Hives  . Cephalexin Hives      Review of Systems: See HPI for pertinent ROS. All other ROS negative.    Physical Exam: Blood pressure 122/78, pulse 60, temperature 98.1 F (36.7 C), temperature source Oral, resp. rate 18, weight 174 lb (78.926 kg)., Body mass index is 32.89 kg/(m^2). General:  WNWD WF. Appears in no acute distress. HEENT: Normocephalic, atraumatic, eyes without discharge, sclera non-icteric, nares are without discharge. Bilateral auditory canals clear, TM's are without perforation, pearly grey and translucent with reflective cone of light bilaterally. Oral cavity moist, posterior pharynx without exudate, erythema, peritonsillar abscess.  Neck: Supple. No thyromegaly. No lymphadenopathy. Lungs: Clear bilaterally to auscultation without wheezes, rales, or rhonchi. Breathing is unlabored. Heart: Regular rhythm. No murmurs, rubs, or gallops. Msk:  Strength and tone normal for age. Extremities/Skin: Warm and dry. Neuro: Alert and oriented X 3. Moves all extremities spontaneously. Gait is normal. CNII-XII grossly in tact. Psych:  Responds to questions appropriately with a normal affect.  ASSESSMENT AND PLAN:  69 y.o. year old female with  1. Bacterial respiratory infection At present time lungs are clear with no wheezes. She can continue to use the albuterol if needed. Go ahead and take antibiotic as directed. Follow-up if symptoms worsen at all or if they do not resolve within 1 week after completion of antibiotic. - azithromycin (ZITHROMAX) 250 MG tablet; Day 1: Take 2 daily. Days 2-5: Take 1 daily.  Dispense: 6 tablet; Refill: 0  Handicap parking Form  completed.   Signed, [address] [place], [place], [facility] 08/12/2015 11:12 AM

## 2015-09-12 ENCOUNTER — Other Ambulatory Visit: Payer: Self-pay | Admitting: Physician Assistant

## 2015-09-12 NOTE — Telephone Encounter (Signed)
Medication refilled per protocol. 

## 2015-09-13 ENCOUNTER — Other Ambulatory Visit: Payer: Self-pay | Admitting: Physician Assistant

## 2015-09-13 NOTE — Telephone Encounter (Signed)
One 30 day supply sent.  Pt needs follow up lab work and has not had done.

## 2015-10-08 ENCOUNTER — Encounter: Payer: Self-pay | Admitting: Podiatry

## 2015-10-08 ENCOUNTER — Ambulatory Visit (INDEPENDENT_AMBULATORY_CARE_PROVIDER_SITE_OTHER): Payer: Medicare Other

## 2015-10-08 ENCOUNTER — Ambulatory Visit (INDEPENDENT_AMBULATORY_CARE_PROVIDER_SITE_OTHER): Payer: Medicare Other | Admitting: Podiatry

## 2015-10-08 VITALS — BP 177/83 | HR 53 | Resp 16

## 2015-10-08 DIAGNOSIS — M722 Plantar fascial fibromatosis: Secondary | ICD-10-CM

## 2015-10-08 DIAGNOSIS — L84 Corns and callosities: Secondary | ICD-10-CM

## 2015-10-08 DIAGNOSIS — M79673 Pain in unspecified foot: Secondary | ICD-10-CM | POA: Diagnosis not present

## 2015-10-08 MED ORDER — TRIAMCINOLONE ACETONIDE 10 MG/ML IJ SUSP
10.0000 mg | Freq: Once | INTRAMUSCULAR | Status: AC
Start: 2015-10-08 — End: 2015-10-08
  Administered 2015-10-08: 10 mg

## 2015-10-08 NOTE — Patient Instructions (Signed)

## 2015-10-08 NOTE — Progress Notes (Signed)
Subjective:     Patient ID: Alexa Orozco, female   DOB: 05/26/1946, 70 y.o.   MRN: [id number]  HPI patient states the left heel has been bothering her for around 9 months and she also has a lesion underneath the left foot that bothers her and makes walking painful   Review of Systems  All other systems reviewed and are negative.      Objective:   Physical Exam  Constitutional: She is oriented to person, place, and time.  Cardiovascular: Intact distal pulses.   Musculoskeletal: Normal range of motion.  Neurological: She is oriented to person, place, and time.  Skin: Skin is warm.  Nursing note and vitals reviewed.  neurovascular status intact muscle strength adequate range of motion within normal limits with patient found to have exquisite discomfort plantar aspect left heel at the insertional point tendon calcaneus with fluid buildup and is also noted to have lesion forefoot left with mild edema around the metatarsal. Good digital perfusion and well oriented 3     Assessment:     Plantar fasciitis left with inflammation and keratotic distal lesion left    Plan:     H&P condition reviewed with patient and reviewed treatment options. Reviewed x-rays with patient and today I went ahead and injected the left plantar fashion 3 mg Kenalog 5 mill grams Xylocaine and applied and lesion left and advised on reduced activity and dispensed fascial brace. Patient will be seen back for [place] to recheck in the next 10 days

## 2015-10-08 NOTE — Progress Notes (Signed)
   Subjective:    Patient ID: Alexa Orozco, female    DOB: [birth year], 70 y.o.   MRN: [id number]  HPI Patient presents with foot pain in their left foot; heel; x9 months.  Patient also presents with a painful lesion on their left foot; plantar forefoot (below 3rd toe)  Review of Systems  All other systems reviewed and are negative.      Objective:   Physical Exam        Assessment & Plan:

## 2015-10-11 ENCOUNTER — Other Ambulatory Visit: Payer: Self-pay | Admitting: Cardiology

## 2015-10-11 NOTE — Telephone Encounter (Signed)
Rx(s) sent to pharmacy electronically.  

## 2015-10-17 ENCOUNTER — Ambulatory Visit (INDEPENDENT_AMBULATORY_CARE_PROVIDER_SITE_OTHER): Payer: Medicare Other | Admitting: Podiatry

## 2015-10-17 ENCOUNTER — Encounter: Payer: Self-pay | Admitting: Podiatry

## 2015-10-17 VITALS — BP 123/69 | HR 58 | Resp 16

## 2015-10-17 DIAGNOSIS — M722 Plantar fascial fibromatosis: Secondary | ICD-10-CM | POA: Diagnosis not present

## 2015-10-17 DIAGNOSIS — L84 Corns and callosities: Secondary | ICD-10-CM

## 2015-10-17 DIAGNOSIS — L600 Ingrowing nail: Secondary | ICD-10-CM

## 2015-10-17 NOTE — Patient Instructions (Signed)

## 2015-10-18 NOTE — Progress Notes (Signed)
Subjective:     Patient ID: Alexa Orozco, female   DOB: 05-22-1946, 70 y.o.   MRN: [id number]  HPI patient states my heel is doing a lot better with diminished discomfort and I do have a painful ingrown toenail on my left big toe that I tried to trim and soak without relief and I like to see if he can be fixed   Review of Systems     Objective:   Physical Exam Neurovascular status intact muscle strength adequate with incurvated medial border left hallux that's painful and significant diminishment of plantar fascial pain. Good digital perfusion was noted    Assessment:     Doing better from plantar fascial condition left and ingrown toenail left    Plan:     Reviewed both conditions and advised on physical therapy supportive shoes and not going barefoot for the plantar fasciitis and recommended correction of the ingrown toenail reviewing procedure and risk. Patient wants procedure and today I infiltrated 60 mg Xylocaine Marcaine mixture and remove the medial border exposing matrix and applying phenol 3 applications 30 seconds followed by alcohol lavage and sterile dressing. Gave instructions on soaks and reappoint

## 2015-10-19 ENCOUNTER — Other Ambulatory Visit: Payer: Self-pay | Admitting: Physician Assistant

## 2015-10-19 DIAGNOSIS — E039 Hypothyroidism, unspecified: Secondary | ICD-10-CM

## 2015-10-21 ENCOUNTER — Encounter: Payer: Self-pay | Admitting: Family Medicine

## 2015-10-21 NOTE — Telephone Encounter (Signed)
Medication refill for one time only.  Patient needs to be seen.  Letter sent for patient to call and schedule.  Needs repeat TSH done.

## 2015-10-28 ENCOUNTER — Telehealth: Payer: Self-pay | Admitting: *Deleted

## 2015-10-28 ENCOUNTER — Other Ambulatory Visit: Payer: Self-pay | Admitting: Family Medicine

## 2015-10-28 MED ORDER — ESZOPICLONE 3 MG PO TABS: 3.0000 mg | ORAL_TABLET | Freq: Every evening | ORAL | Status: DC | PRN

## 2015-10-28 NOTE — Telephone Encounter (Signed)
RX called in .

## 2015-10-28 NOTE — Telephone Encounter (Signed)
Left message for patient at [phone] (Cell #) to check to see how they were doing from their ingrown toenail procedure that was performed on Thursday, October 17, 2015. Waiting for a response.

## 2015-10-28 NOTE — Telephone Encounter (Signed)
Approved. #30+2. 

## 2015-10-28 NOTE — Telephone Encounter (Signed)
LRF 07/29/15 #30 + 2  LOV  08/12/15  OK refill?

## 2015-10-29 ENCOUNTER — Other Ambulatory Visit: Payer: Medicare Other

## 2015-10-29 DIAGNOSIS — E039 Hypothyroidism, unspecified: Secondary | ICD-10-CM | POA: Diagnosis not present

## 2015-10-30 ENCOUNTER — Encounter: Payer: Self-pay | Admitting: *Deleted

## 2015-10-30 LAB — TSH: TSH: 0.48 m[IU]/L

## 2015-11-04 ENCOUNTER — Other Ambulatory Visit: Payer: Self-pay | Admitting: Cardiology

## 2015-11-04 MED ORDER — CLOPIDOGREL BISULFATE 75 MG PO TABS: 75.0000 mg | ORAL_TABLET | Freq: Every day | ORAL | Status: DC

## 2015-11-04 MED ORDER — ATORVASTATIN CALCIUM 40 MG PO TABS: 40.0000 mg | ORAL_TABLET | Freq: Every day | ORAL | Status: DC

## 2015-11-04 NOTE — Telephone Encounter (Signed)
°*  STAT* If patient is at the pharmacy, call can be transferred to refill team.   1. Which medications need to be refilled? (please list name of each medication and dose if known) Plavix 75mg  and Lipitor 40mg    2. Which pharmacy/location (including street and city if local pharmacy) is medication to be sent to?[facility] on [address]   3. Do they need a 30 day or 90 day supply? [place]

## 2015-11-04 NOTE — Telephone Encounter (Signed)
Rx(s) sent for 30 days - pt needs to keep 4/4 appt.

## 2015-11-06 ENCOUNTER — Other Ambulatory Visit: Payer: Self-pay | Admitting: Cardiology

## 2015-11-06 NOTE — Telephone Encounter (Signed)
Rx request sent to pharmacy.  

## 2015-11-07 ENCOUNTER — Telehealth: Payer: Self-pay | Admitting: *Deleted

## 2015-11-07 NOTE — Telephone Encounter (Signed)
Pt states the tip of the toenail is red, tender when pushed.  I asked pt if she was continuing to perform the soaks and she said no.  I instructed pt to begin 1/2C epsom salt soaks, and 1 qt water soaks 20 minutes twice daily and cover with lightly coated antibacterial ointment bandaid for a week and call with status or earlier if problems.  Pt states understanding.

## 2015-11-11 ENCOUNTER — Other Ambulatory Visit: Payer: Self-pay | Admitting: Physician Assistant

## 2015-11-11 NOTE — Telephone Encounter (Signed)
Medication refilled per protocol. 

## 2015-11-14 ENCOUNTER — Ambulatory Visit (INDEPENDENT_AMBULATORY_CARE_PROVIDER_SITE_OTHER): Payer: Medicare Other | Admitting: Physician Assistant

## 2015-11-14 ENCOUNTER — Encounter: Payer: Self-pay | Admitting: Physician Assistant

## 2015-11-14 VITALS — BP 118/72 | HR 68 | Temp 97.9°F | Resp 18 | Wt 177.0 lb

## 2015-11-14 DIAGNOSIS — J988 Other specified respiratory disorders: Principal | ICD-10-CM

## 2015-11-14 DIAGNOSIS — B9789 Other viral agents as the cause of diseases classified elsewhere: Secondary | ICD-10-CM

## 2015-11-14 DIAGNOSIS — B349 Viral infection, unspecified: Secondary | ICD-10-CM

## 2015-11-14 NOTE — Progress Notes (Signed)
Patient ID: Alexa Orozco MRN: [id number], DOB: 1946/06/03, 70 y.o. Date of Encounter: 11/14/2015, 2:46 PM    Chief Complaint:  Chief Complaint  Patient presents with  . sick x 3 days    cough, runny nose, HA, was achy yesterday     HPI: 70 y.o. year old white female presents with above.   She states that it mostly seems to be in her nose. Says that what is coming out is just clear and "dripping".  During the visit she does have a congested cough. She states that she is having no increased wheezing. Says that all the time when she lays down at night she will feel that she is having little bit of wheezing but says that this has remained the same and has not increased. She has not had to use her albuterol. She has had no fever. No sore throat or earache or sinus pain.     Home Meds:   Outpatient Prescriptions Prior to Visit  Medication Sig Dispense Refill  . albuterol (PROVENTIL HFA;VENTOLIN HFA) 108 (90 BASE) MCG/ACT inhaler Inhale 2 puffs into the lungs every 4 (four) hours as needed for wheezing or shortness of breath. 1 Inhaler 3  . atorvastatin (LIPITOR) 40 MG tablet Take 1 tablet (40 mg total) by mouth daily. <please keep appointment for refills> 30 tablet 0  . clopidogrel (PLAVIX) 75 MG tablet TAKE 1 TABLET (75 MG TOTAL) BY MOUTH DAILY. 90 tablet 0  . DULoxetine (CYMBALTA) 60 MG capsule TAKE 1 CAPSULE (60 MG TOTAL) BY MOUTH DAILY. 90 capsule 3  . estradiol (ESTRACE) 1 MG tablet TAKE 1 TABLET BY MOUTH DAILY 90 tablet 1  . Eszopiclone 3 MG TABS Take 1 tablet (3 mg total) by mouth at bedtime as needed. Take immediately before bedtime 30 tablet 2  . levothyroxine (SYNTHROID, LEVOTHROID) 88 MCG tablet TAKE 1 TABLET BY MOUTH EVERY DAY 90 tablet 1  . nitroGLYCERIN (NITROSTAT) 0.4 MG SL tablet Place 1 tablet (0.4 mg total) under the tongue every 5 (five) minutes as needed for chest pain. 25 tablet 6  . pantoprazole (PROTONIX) 40 MG tablet Take 1 tablet (40 mg total) by mouth daily. 30  tablet 5  . tiotropium (SPIRIVA HANDIHALER) 18 MCG inhalation capsule PLACE 1 CAPSULE (18 MCG TOTAL) INTO INHALER AND INHALE DAILY. 30 capsule 5   No facility-administered medications prior to visit.    Allergies:  Allergies  Allergen Reactions  . Bystolic [Nebivolol Hcl] Hives  . Cephalexin Hives      Review of Systems: See HPI for pertinent ROS. All other ROS negative.    Physical Exam: Blood pressure 118/72, pulse 68, temperature 97.9 F (36.6 C), temperature source Oral, resp. rate 18, weight 177 lb (80.287 kg)., Body mass index is 33.46 kg/(m^2). General:  WNWD WF. Appears in no acute distress. HEENT: Normocephalic, atraumatic, eyes without discharge, sclera non-icteric, nares are without discharge. Bilateral auditory canals clear, TM's are without perforation, pearly grey and translucent with reflective cone of light bilaterally. Oral cavity moist, posterior pharynx without exudate, erythema, peritonsillar abscess. No tenderness with percussion to frontal or maxillary sinuses bilaterally.  Neck: Supple. No thyromegaly. No lymphadenopathy. Lungs: Clear bilaterally to auscultation without wheezes, rales, or rhonchi. Breathing is unlabored. Lungs are clear with good breath sounds and good air movement throughout bilaterally. No wheezing. Heart: Regular rhythm. No murmurs, rubs, or gallops. Msk:  Strength and tone normal for age. Extremities/Skin: Warm and dry. Neuro: Alert and oriented X 3. Moves all extremities  spontaneously. Gait is normal. CNII-XII grossly in tact. Psych:  Responds to questions appropriately with a normal affect.     ASSESSMENT AND PLAN:  70 y.o. year old female with  1. Viral respiratory infection Gave her some samples of Norel AD to use as directed for symptom relief.  If develops increased chest congestion recommend Mucinex DM as expectorant. Told her to follow-up if develops fever or symptoms worsen significantly or if symptoms persist greater than 7-10  days. Also follow-up if develops increased wheezing.   [address] [place], [place], [facility] 11/14/2015 2:46 PM

## 2015-11-18 ENCOUNTER — Encounter: Payer: Self-pay | Admitting: Physician Assistant

## 2015-11-18 ENCOUNTER — Ambulatory Visit (INDEPENDENT_AMBULATORY_CARE_PROVIDER_SITE_OTHER): Payer: Medicare Other | Admitting: Physician Assistant

## 2015-11-18 VITALS — BP 122/76 | HR 68 | Temp 99.2°F | Resp 20 | Wt 167.0 lb

## 2015-11-18 DIAGNOSIS — J988 Other specified respiratory disorders: Secondary | ICD-10-CM | POA: Diagnosis not present

## 2015-11-18 DIAGNOSIS — B9689 Other specified bacterial agents as the cause of diseases classified elsewhere: Principal | ICD-10-CM

## 2015-11-18 MED ORDER — PREDNISONE 20 MG PO TABS: 20.0000 mg | ORAL_TABLET | Freq: Every day | ORAL | Status: DC

## 2015-11-18 MED ORDER — AZITHROMYCIN 250 MG PO TABS: ORAL_TABLET | ORAL | Status: DC

## 2015-11-18 NOTE — Progress Notes (Signed)
Patient ID: Aleea Keef MRN: [id number], DOB: [birth year], 70 y.o. Date of Encounter: 11/18/2015, 11:27 AM    Chief Complaint:  Chief Complaint  Patient presents with  . still sick    more short of breath     HPI: 70 y.o. year old female presents with above. I reviewed her recent office visit note with me from 11/14/15. She states that now she the mucus she is getting out of her nose and the phlegm that she is coughing up is on all thicker yellow or green. Is that now the cough seems that it is from congestion in her chest. Also says that she has had to use her albuterol more than usual over the weekend. No fevers or chills.     Home Meds:   Outpatient Prescriptions Prior to Visit  Medication Sig Dispense Refill  . albuterol (PROVENTIL HFA;VENTOLIN HFA) 108 (90 BASE) MCG/ACT inhaler Inhale 2 puffs into the lungs every 4 (four) hours as needed for wheezing or shortness of breath. 1 Inhaler 3  . atorvastatin (LIPITOR) 40 MG tablet Take 1 tablet (40 mg total) by mouth daily. <please keep appointment for refills> 30 tablet 0  . clopidogrel (PLAVIX) 75 MG tablet TAKE 1 TABLET (75 MG TOTAL) BY MOUTH DAILY. 90 tablet 0  . DULoxetine (CYMBALTA) 60 MG capsule TAKE 1 CAPSULE (60 MG TOTAL) BY MOUTH DAILY. 90 capsule 3  . estradiol (ESTRACE) 1 MG tablet TAKE 1 TABLET BY MOUTH DAILY 90 tablet 1  . Eszopiclone 3 MG TABS Take 1 tablet (3 mg total) by mouth at bedtime as needed. Take immediately before bedtime 30 tablet 2  . levothyroxine (SYNTHROID, LEVOTHROID) 88 MCG tablet TAKE 1 TABLET BY MOUTH EVERY DAY 90 tablet 1  . nitroGLYCERIN (NITROSTAT) 0.4 MG SL tablet Place 1 tablet (0.4 mg total) under the tongue every 5 (five) minutes as needed for chest pain. 25 tablet 6  . pantoprazole (PROTONIX) 40 MG tablet Take 1 tablet (40 mg total) by mouth daily. 30 tablet 5  . tiotropium (SPIRIVA HANDIHALER) 18 MCG inhalation capsule PLACE 1 CAPSULE (18 MCG TOTAL) INTO INHALER AND INHALE DAILY. 30 capsule 5    No facility-administered medications prior to visit.    Allergies:  Allergies  Allergen Reactions  . Bystolic [Nebivolol Hcl] Hives  . Cephalexin Hives      Review of Systems: See HPI for pertinent ROS. All other ROS negative.    Physical Exam: Blood pressure 122/76, pulse 68, temperature 99.2 F (37.3 C), temperature source Oral, resp. rate 20, weight 167 lb (75.751 kg), SpO2 94 %., Body mass index is 31.57 kg/(m^2). General:  WNWD WF. Appears in no acute distress. HEENT: Normocephalic, atraumatic, eyes without discharge, sclera non-icteric, nares are without discharge. Bilateral auditory canals clear, TM's are without perforation, pearly grey and translucent with reflective cone of light bilaterally. Oral cavity moist, posterior pharynx without exudate, erythema, peritonsillar abscess. No tenderness with percussion to frontal or maxillary sinuses bilaterally.  Neck: Supple. No thyromegaly. No lymphadenopathy. Lungs: Clear bilaterally to auscultation without wheezes, rales, or rhonchi. Breathing is unlabored. No wheezes on exam at present time. Currently lungs are clear. Heart: Regular rhythm. No murmurs, rubs, or gallops. Msk:  Strength and tone normal for age. Extremities/Skin: Warm and dry. Neuro: Alert and oriented X 3. Moves all extremities spontaneously. Gait is normal. CNII-XII grossly in tact. Psych:  Responds to questions appropriately with a normal affect.     ASSESSMENT AND PLAN:  70 y.o. year old female  with  1. Bacterial respiratory infection She is to start both the antibiotic and prednisone immediately and take as directed. Follow-up if symptoms worsen or if symptoms do not resolve within 1 week after completion of these medications. Continue using albuterol every 4-6 hours if needed for wheezing. - azithromycin (ZITHROMAX) 250 MG tablet; Day 1: Take 2 daily.  Days 2-5: Take 1 daily.  Dispense: 6 tablet; Refill: 0 - predniSONE (DELTASONE) 20 MG tablet; Take 1  tablet (20 mg total) by mouth daily with breakfast.  Dispense: 5 tablet; Refill: 0   Signed, [address] [place], [place], [facility] 11/18/2015 11:27 AM

## 2015-11-21 ENCOUNTER — Telehealth: Payer: Self-pay | Admitting: Family Medicine

## 2015-11-21 MED ORDER — BENZONATATE 200 MG PO CAPS: 200.0000 mg | ORAL_CAPSULE | Freq: Two times a day (BID) | ORAL | Status: DC | PRN

## 2015-11-21 NOTE — Telephone Encounter (Signed)
Tessalon 200mg  1 po Q 12 Hr prn cough suppressant  # 30 + 1

## 2015-11-21 NOTE — Telephone Encounter (Signed)
Needs something for cough.  Is harsh and chest hurts to cough.  Still on antibiotics.  Just needs something to kick cough.

## 2015-11-21 NOTE — Telephone Encounter (Signed)
Pt called and made aware of Rx.  Rx to pharmacy

## 2015-11-26 ENCOUNTER — Encounter: Payer: Self-pay | Admitting: Family Medicine

## 2015-11-26 ENCOUNTER — Other Ambulatory Visit: Payer: Self-pay | Admitting: Physician Assistant

## 2015-11-26 ENCOUNTER — Other Ambulatory Visit: Payer: Self-pay | Admitting: *Deleted

## 2015-11-26 MED ORDER — ATORVASTATIN CALCIUM 40 MG PO TABS: 40.0000 mg | ORAL_TABLET | Freq: Every day | ORAL | Status: DC

## 2015-11-26 NOTE — Telephone Encounter (Signed)
Medication refilled per protocol. 

## 2015-11-26 NOTE — Telephone Encounter (Signed)
Duplicate request, refill confirmed thru pharmacy

## 2015-12-02 ENCOUNTER — Other Ambulatory Visit: Payer: Self-pay | Admitting: Cardiology

## 2015-12-03 ENCOUNTER — Ambulatory Visit: Payer: 59 | Admitting: Cardiology

## 2015-12-30 ENCOUNTER — Other Ambulatory Visit: Payer: Self-pay | Admitting: Cardiology

## 2015-12-30 NOTE — Telephone Encounter (Signed)
REFILL 

## 2015-12-31 ENCOUNTER — Other Ambulatory Visit: Payer: Self-pay | Admitting: *Deleted

## 2015-12-31 MED ORDER — CLOPIDOGREL BISULFATE 75 MG PO TABS: 75.0000 mg | ORAL_TABLET | Freq: Every day | ORAL | Status: DC

## 2016-01-21 ENCOUNTER — Other Ambulatory Visit: Payer: Self-pay | Admitting: Physician Assistant

## 2016-01-21 NOTE — Telephone Encounter (Signed)
LRF 10/28/15 #30 + 2  LOV 11/18/15  Ok refill?

## 2016-01-21 NOTE — Telephone Encounter (Signed)
Approved.  #30+5. 

## 2016-01-22 NOTE — Telephone Encounter (Signed)
rx called in

## 2016-01-30 ENCOUNTER — Encounter: Payer: Self-pay | Admitting: Podiatry

## 2016-01-30 ENCOUNTER — Ambulatory Visit (INDEPENDENT_AMBULATORY_CARE_PROVIDER_SITE_OTHER): Payer: Medicare Other | Admitting: Podiatry

## 2016-01-30 VITALS — BP 128/70 | HR 67 | Resp 16

## 2016-01-30 DIAGNOSIS — L600 Ingrowing nail: Secondary | ICD-10-CM

## 2016-01-30 DIAGNOSIS — M722 Plantar fascial fibromatosis: Secondary | ICD-10-CM | POA: Diagnosis not present

## 2016-01-30 MED ORDER — TRIAMCINOLONE ACETONIDE 10 MG/ML IJ SUSP
10.0000 mg | Freq: Once | INTRAMUSCULAR | Status: AC
Start: 2016-01-30 — End: 2016-01-30
  Administered 2016-01-30: 10 mg

## 2016-01-30 NOTE — Patient Instructions (Signed)

## 2016-01-31 NOTE — Progress Notes (Signed)
Subjective:     Patient ID: Alexa Orozco, female   DOB: Jun 07, 1946, 70 y.o.   MRN: [id number]  HPI patient states my heel is doing well but has started to hurt me again recently and I haven't had any trouble walking and if not it any cramps in my legs   Review of Systems     Objective:   Physical Exam Neurovascular status was found to be intact with patient having good digital perfusion was noted to have discomfort in the plantar heel left at the insertional point of the tendon into the calcaneus with fluid buildup    Assessment:     Plantar fasciitis left with inflammation and fluid around the medial band    Plan:     I reinjected the plantar fascia today 3 mg Kenalog 5 mg Xylocaine and gave instructions on physical therapy. If symptoms persist she is to let us know

## 2016-02-20 ENCOUNTER — Other Ambulatory Visit: Payer: Self-pay | Admitting: Physician Assistant

## 2016-02-20 ENCOUNTER — Other Ambulatory Visit: Payer: Self-pay | Admitting: Cardiology

## 2016-02-20 NOTE — Telephone Encounter (Signed)
Refill appropriate and filled per protocol. 

## 2016-02-20 NOTE — Telephone Encounter (Signed)
Rx(s) sent to pharmacy electronically.  

## 2016-02-22 ENCOUNTER — Other Ambulatory Visit: Payer: Self-pay | Admitting: Physician Assistant

## 2016-02-24 NOTE — Telephone Encounter (Signed)
Medication refilled per protocol. 

## 2016-03-16 ENCOUNTER — Ambulatory Visit (INDEPENDENT_AMBULATORY_CARE_PROVIDER_SITE_OTHER): Payer: Medicare Other | Admitting: Cardiology

## 2016-03-16 ENCOUNTER — Encounter: Payer: Self-pay | Admitting: Cardiology

## 2016-03-16 VITALS — BP 116/62 | HR 65 | Ht 61.0 in | Wt 174.0 lb

## 2016-03-16 DIAGNOSIS — Z720 Tobacco use: Secondary | ICD-10-CM | POA: Diagnosis not present

## 2016-03-16 DIAGNOSIS — I2510 Atherosclerotic heart disease of native coronary artery without angina pectoris: Secondary | ICD-10-CM

## 2016-03-16 DIAGNOSIS — E785 Hyperlipidemia, unspecified: Secondary | ICD-10-CM

## 2016-03-16 DIAGNOSIS — I10 Essential (primary) hypertension: Secondary | ICD-10-CM | POA: Diagnosis not present

## 2016-03-16 DIAGNOSIS — E669 Obesity, unspecified: Secondary | ICD-10-CM

## 2016-03-16 DIAGNOSIS — I208 Other forms of angina pectoris: Secondary | ICD-10-CM

## 2016-03-16 DIAGNOSIS — Z9861 Coronary angioplasty status: Secondary | ICD-10-CM

## 2016-03-16 NOTE — Patient Instructions (Signed)
Your physician wants you to follow-up in: 1 year or sooner if needed. You will receive a reminder letter in the mail two months in advance. If you don't receive a letter, please call our office to schedule the follow-up appointment.   If you need a refill on your cardiac medications before your next appointment, please call your pharmacy.   

## 2016-03-16 NOTE — Progress Notes (Signed)
PCP: [name], PA-C  Clinic Note: Chief Complaint  Patient presents with  . Follow-up    pt c/o sob and dizziness   . Coronary Artery Disease    HPI: Alexa Orozco is a 70 y.o. female with a PMH below who presents today for delayed 1 yr f/u for CAD-PCI In the setting of class III angina.  Alyvea Kick was last seenIn January 2016. This was after a Myoview stress test that was negative for ischemia.  Recent Hospitalizations: None  Studies Reviewed: None  Interval History: Alexa Orozco presents today for routine follow-up, having missed the reminder to be seen in January of this year. She really has not had any significant cardiac event since I last saw her. She has baseline exertional dyspnea from smoking.  No further fullness in her throat sensation or any other suggestion of anginal symptom. Note chest tightness or pressure with rest or exertion. No resting dyspnea.  No PND, orthopnea or edema.  Occasional palpitations, but no lightheadedness, dizziness, weakness or syncope/near syncope. No TIA/amaurosis fugax symptoms. No melena, hematochezia, hematuria, or epstaxis. No claudication. She does note having some fatigue and feeling tired new the end of the day.  ROS: A comprehensive was performed. Review of Systems  Constitutional: Negative for weight loss and malaise/fatigue.  Eyes: Negative for blurred vision.  Respiratory: Positive for cough, shortness of breath and wheezing. Negative for sputum production.   Cardiovascular:       Per history of present illness  Gastrointestinal: Negative for heartburn.  Musculoskeletal: Positive for joint pain. Negative for myalgias and falls.  Neurological: Negative for dizziness and headaches.  Psychiatric/Behavioral: Negative for depression and memory loss. The patient is not nervous/anxious and does not have insomnia.     Past Medical History  Diagnosis Date  . Cataract     h/o bilat repair  . Asthma   . Depression   . Emphysema of  lung ([place])   . Allergy   . Hypothyroidism   . CAD S/P percutaneous coronary angioplasty February 2007    Class III Angina --> Cath: 85% -- PCI 3.71mm x 24mm (4.0 mm) Cypher DES to RCA; Myoview October 2014: normal stress test: LOW RISK; mild anteroapical breast attenuation  . History of palpitations   . GERD (gastroesophageal reflux disease)   . Dyslipidemia   . Raynaud disease   . History of tobacco abuse   . Hypertension     well-controlled  . Osteoporosis   . Depression 11/29/2013    Past Surgical History  Procedure Laterality Date  . Abdominal hysterectomy      h/o partila hysterectomy  . Tonsillectomy  1959  . Breast biopsy  1988  . Coronary angioplasty with stent placement  10/14/2005    cypher DES (3.67mmx18mm) to high grade RCA lesion  . Transthoracic echocardiogram  05/22/2009    EF=>55%, normal LV systolic function; normal RV systolic function; mild MR/TR  . Nm myoview ltd  July 2012 of October 2014    '12: dipyridamole; Normal, low risk study; 2014 - normal stress test: LOW RISK; mild anteroapical breast attenuation   Prior to Admission medications   Medication Sig Start Date End Date Taking? Authorizing Provider  albuterol (PROVENTIL HFA;VENTOLIN HFA) 108 (90 BASE) MCG/ACT inhaler Inhale 2 puffs into the lungs every 4 (four) hours as needed for wheezing or shortness of breath. 06/20/15  Yes Mary B Dixon, PA-C  atorvastatin (LIPITOR) 40 MG tablet TAKE 1 TABLET (40 MG TOTAL) BY MOUTH DAILY. KEEP OV. 02/20/16  Yes Leonie Man, MD  clopidogrel (PLAVIX) 75 MG tablet Take 1 tablet (75 mg total) by mouth daily. NEED OV. 12/31/15  Yes Leonie Man, MD  DULoxetine (CYMBALTA) 60 MG capsule TAKE 1 CAPSULE (60 MG TOTAL) BY MOUTH DAILY. 06/20/15  Yes Orlena Sheldon, PA-C  estradiol (ESTRACE) 1 MG tablet TAKE 1 TABLET BY MOUTH DAILY 02/24/16  Yes Orlena Sheldon, PA-C  Eszopiclone 3 MG TABS TAKE 1 TABLET BY MOUTH AT BEDTIME AS NEEDED 01/22/16  Yes Orlena Sheldon, PA-C  levothyroxine  (SYNTHROID, LEVOTHROID) 88 MCG tablet TAKE 1 TABLET BY MOUTH EVERY DAY 11/11/15  Yes Orlena Sheldon, PA-C  nitroGLYCERIN (NITROSTAT) 0.4 MG SL tablet Place 1 tablet (0.4 mg total) under the tongue every 5 (five) minutes as needed for chest pain. 05/31/13  Yes Leonie Man, MD  pantoprazole (PROTONIX) 40 MG tablet TAKE 1 TABLET BY MOUTH EVERY DAY 02/20/16  Yes Orlena Sheldon, PA-C  tiotropium (SPIRIVA HANDIHALER) 18 MCG inhalation capsule PLACE 1 CAPSULE (18 MCG TOTAL) INTO INHALER AND INHALE DAILY. 06/20/15  Yes Orlena Sheldon, PA-C   Allergies  Allergen Reactions  . Bystolic [Nebivolol Hcl] Hives  . Cephalexin Hives     Social History   Social History  . Marital Status: Widowed    Spouse Name: N/A  . Number of Children: 3  . Years of Education: 10th    Occupational History  . Retired    Social History Main Topics  . Smoking status: Current Every Day Smoker -- 0.50 packs/day for 35 years    Types: Cigarettes  . Smokeless tobacco: Never Used  . Alcohol Use: No  . Drug Use: No  . Sexual Activity: Not Asked   Other Topics Concern  . None   Social History Narrative   She is a widowed mother of 53, grandmother for, great-grandmother 12.  She still smokes about half pack a day.  She doesn't have about 35 years.  She does not drink.   Prior to her symptoms coming on, she used to do all kind of walking around and working in the garden and other activities with her close friend.   Family History  Problem Relation Age of Onset  . Liver cancer Mother   . Heart attack Mother   . Heart Problems Sister     Wt Readings from Last 3 Encounters:  03/16/16 174 lb (78.926 kg)  11/18/15 167 lb (75.751 kg)  11/14/15 177 lb (80.287 kg)    PHYSICAL EXAM BP 116/62 mmHg  Pulse 65  Ht 5\' 1"  (1.549 m)  Wt 174 lb (78.926 kg)  BMI 32.89 kg/m2  SpO2 96% General appearance: alert, cooperative, appears stated age, no distress and mildly obese  Neck: no LAN, no carotid bruit and no JVD  Lungs:  CTAB., normal percussion bilaterally and non-labored  Heart: RRR, S1, S2 normal, no murmur, click, rub or gallop; nondisplaced PMI  Abdomen: soft, non-tender; bowel sounds normal; no masses, no organomegaly; mild truncal obesity  Extremities: extremities normal, atraumatic, no cyanosis, and no edema  Pulses: Trace to 1+ but palpable on DP and PT pulses. Mostly symmetric, but somewhat more diminished on the left;  Neurologic: Mental status: Alert, oriented, thought content appropriate  Cranial nerves: normal (II-XII grossly intact)    Adult ECG Report  Rate: 65 ;  Rhythm: normal sinus rhythm and Rightward axis of 93. Otherwise normal intervals, durations;   Narrative Interpretation: Normal, stable EKG   Other studies Reviewed: Additional  studies/ records that were reviewed today include:  Recent Labs:   Lab Results  Component Value Date   CHOL 162 06/20/2015   HDL 66 06/20/2015   LDLCALC 80 06/20/2015   TRIG 80 06/20/2015   CHOLHDL 2.5 06/20/2015     ASSESSMENT / PLAN: Problem List Items Addressed This Visit    Tobacco abuse (Chronic)    Smoking cessation instruction/counseling given:  counseled patient on the dangers of tobacco use, advised patient to stop smoking, and reviewed strategies to maximize success      Stable angina (HCC) (Chronic)    Has not had any further episodes.      Obesity (BMI 30-39.9) (Chronic)    The patient understands the need to lose weight with diet and exercise. We have discussed specific strategies for this. - Diet & exercise      Essential hypertension - Primary (Chronic)    Not really hypertensive anymore. Not requiring treatment.      Relevant Orders   EKG 12-Lead (Completed)   Dyslipidemia (Chronic)    On statin. Monitored by PCP. Labs not available. By her report last check was "very good ". The last LDL last saw was from October last year that was 59. For now would continue Lasix 40 mg Atorvastatin.Marland Kitchen      CAD S/P percutaneous  coronary angioplasty (Chronic)    No further recurrence of anginal symptoms. Been stable for the last year and a half. Essentially ran out of her nitroglycerin prescription and that needs to be refilled. Remains on Plavix without aspirin. He is not on either beta blocker or ACE inhibitor/ARB simply because of history of hypotension on medications. Mostly because she has a first generation Cypher DES stent, I would like to use Plavix as opposed to aspirin. But it would be okay to stop her procedures.      Relevant Orders   EKG 12-Lead (Completed)      Current medicines are reviewed at length with the patient today. (+/- concerns) None The following changes have been made: None Studies Ordered:   Orders Placed This Encounter  Procedures  . EKG 12-Lead      Glenetta Hew, M.D., M.S. Interventional Cardiologist   Pager # [phone] Phone # [phone] [address] [place], [place] [address]

## 2016-03-18 ENCOUNTER — Encounter: Payer: Self-pay | Admitting: Cardiology

## 2016-03-18 NOTE — Assessment & Plan Note (Signed)
Has not had any further episodes.

## 2016-03-18 NOTE — Assessment & Plan Note (Signed)
The patient understands the need to lose weight with diet and exercise. We have discussed specific strategies for this. - Diet & exercise

## 2016-03-18 NOTE — Assessment & Plan Note (Signed)
Not really hypertensive anymore. Not requiring treatment.

## 2016-03-18 NOTE — Assessment & Plan Note (Signed)
On statin. Monitored by PCP. Labs not available. By her report last check was "very good ". The last LDL last saw was from October last year that was 32. For now would continue Lasix 40 mg Atorvastatin.Marland Kitchen

## 2016-03-18 NOTE — Assessment & Plan Note (Signed)
No further recurrence of anginal symptoms. Been stable for the last year and a half. Essentially ran out of her nitroglycerin prescription and that needs to be refilled. Remains on Plavix without aspirin. He is not on either beta blocker or ACE inhibitor/ARB simply because of history of hypotension on medications. Mostly because she has a first generation Cypher DES stent, I would like to use Plavix as opposed to aspirin. But it would be okay to stop her procedures.

## 2016-03-18 NOTE — Assessment & Plan Note (Signed)
Smoking cessation instruction/counseling given:  counseled patient on the dangers of tobacco use, advised patient to stop smoking, and reviewed strategies to maximize success 

## 2016-03-29 ENCOUNTER — Other Ambulatory Visit: Payer: Self-pay | Admitting: Cardiology

## 2016-04-22 DIAGNOSIS — H02831 Dermatochalasis of right upper eyelid: Secondary | ICD-10-CM | POA: Diagnosis not present

## 2016-04-22 DIAGNOSIS — Z961 Presence of intraocular lens: Secondary | ICD-10-CM | POA: Diagnosis not present

## 2016-04-22 DIAGNOSIS — H1851 Endothelial corneal dystrophy: Secondary | ICD-10-CM | POA: Diagnosis not present

## 2016-04-22 DIAGNOSIS — H02834 Dermatochalasis of left upper eyelid: Secondary | ICD-10-CM | POA: Diagnosis not present

## 2016-04-22 DIAGNOSIS — H10413 Chronic giant papillary conjunctivitis, bilateral: Secondary | ICD-10-CM | POA: Diagnosis not present

## 2016-04-22 DIAGNOSIS — H04123 Dry eye syndrome of bilateral lacrimal glands: Secondary | ICD-10-CM | POA: Diagnosis not present

## 2016-05-07 ENCOUNTER — Ambulatory Visit (INDEPENDENT_AMBULATORY_CARE_PROVIDER_SITE_OTHER): Payer: Medicare Other | Admitting: Podiatry

## 2016-05-07 VITALS — BP 138/75 | HR 59 | Resp 16

## 2016-05-07 DIAGNOSIS — M722 Plantar fascial fibromatosis: Secondary | ICD-10-CM | POA: Diagnosis not present

## 2016-05-07 MED ORDER — TRIAMCINOLONE ACETONIDE 10 MG/ML IJ SUSP
10.0000 mg | Freq: Once | INTRAMUSCULAR | Status: AC
  Administered 2016-05-07: 10 mg

## 2016-05-08 NOTE — Progress Notes (Signed)
Subjective:     Patient ID: Alexa Orozco, female   DOB: 1946/03/31, 70 y.o.   MRN: [id number]  HPI patient presents with pain in the bottom of the left heel   Review of Systems     Objective:   Physical Exam Neurovascular status intact health history normal with patient found to have exquisite discomfort that's just started plantar aspect left heel    Assessment:     Acute plantar fasciitis left    Plan:     Reinject plantar fascial left 3 mg Kenalog 5 mill grams Xylocaine advised on physical therapy anti-inflammatories and reappoint to recheck

## 2016-05-20 ENCOUNTER — Ambulatory Visit: Payer: Medicare Other | Admitting: Podiatry

## 2016-05-22 ENCOUNTER — Ambulatory Visit: Payer: Medicare Other | Admitting: Podiatry

## 2016-05-22 ENCOUNTER — Other Ambulatory Visit: Payer: Self-pay | Admitting: Cardiology

## 2016-05-24 ENCOUNTER — Other Ambulatory Visit: Payer: Self-pay | Admitting: Physician Assistant

## 2016-05-25 NOTE — Telephone Encounter (Signed)
Rx request sent to pharmacy.  

## 2016-06-03 ENCOUNTER — Ambulatory Visit (INDEPENDENT_AMBULATORY_CARE_PROVIDER_SITE_OTHER): Payer: Medicare Other | Admitting: Physician Assistant

## 2016-06-03 ENCOUNTER — Encounter: Payer: Self-pay | Admitting: Physician Assistant

## 2016-06-03 VITALS — BP 132/80 | HR 54 | Temp 98.0°F | Resp 16 | Wt 172.0 lb

## 2016-06-03 DIAGNOSIS — L239 Allergic contact dermatitis, unspecified cause: Secondary | ICD-10-CM

## 2016-06-03 DIAGNOSIS — I208 Other forms of angina pectoris: Secondary | ICD-10-CM | POA: Diagnosis not present

## 2016-06-03 MED ORDER — METHYLPREDNISOLONE ACETATE 80 MG/ML IJ SUSP
80.0000 mg | Freq: Once | INTRAMUSCULAR | Status: AC
  Administered 2016-06-03: 80 mg via INTRAMUSCULAR

## 2016-06-03 NOTE — Progress Notes (Signed)
Patient ID: Alexa Orozco MRN: [id number], DOB: Sep 08, 1945, 70 y.o. Date of Encounter: 06/03/2016, 12:35 PM    Chief Complaint:  Chief Complaint  Patient presents with  . office visit     HPI: 70 y.o. year old white female states that she was working with some rose bushes and got into some poison oak or poison ivy 2-3 days ago. She has areas of itchy rash scattered over her entire body.  Has a large area underneath her left breast. Also fairly big spot on her right arm and other splotches on her arms and a few areas on her lower legs. Also an area on her left eyelid. No other complaints or concerns.     Home Meds:   Outpatient Medications Prior to Visit  Medication Sig Dispense Refill  . albuterol (PROVENTIL HFA;VENTOLIN HFA) 108 (90 BASE) MCG/ACT inhaler Inhale 2 puffs into the lungs every 4 (four) hours as needed for wheezing or shortness of breath. 1 Inhaler 3  . atorvastatin (LIPITOR) 40 MG tablet TAKE 1 TABLET BY MOUTH EVERY DAY 90 tablet 3  . clopidogrel (PLAVIX) 75 MG tablet TAKE 1 TABLET BY MOUTH EVERY DAY  90 tablet 3  . DULoxetine (CYMBALTA) 60 MG capsule TAKE 1 CAPSULE (60 MG TOTAL) BY MOUTH DAILY. 90 capsule 3  . estradiol (ESTRACE) 1 MG tablet TAKE 1 TABLET BY MOUTH DAILY 90 tablet 1  . Eszopiclone 3 MG TABS TAKE 1 TABLET BY MOUTH AT BEDTIME AS NEEDED 30 tablet 5  . levothyroxine (SYNTHROID, LEVOTHROID) 88 MCG tablet TAKE 1 TABLET BY MOUTH EVERY DAY 90 tablet 1  . nitroGLYCERIN (NITROSTAT) 0.4 MG SL tablet Place 1 tablet (0.4 mg total) under the tongue every 5 (five) minutes as needed for chest pain. 25 tablet 6  . pantoprazole (PROTONIX) 40 MG tablet TAKE 1 TABLET BY MOUTH EVERY DAY 90 tablet 1  . tiotropium (SPIRIVA HANDIHALER) 18 MCG inhalation capsule PLACE 1 CAPSULE (18 MCG TOTAL) INTO INHALER AND INHALE DAILY. 30 capsule 5  . cromolyn (OPTICROM) 4 % ophthalmic solution      No facility-administered medications prior to visit.      Allergies:  Allergies    Allergen Reactions  . Bystolic [Nebivolol Hcl] Hives  . Cephalexin Hives      Review of Systems: See HPI for pertinent ROS. All other ROS negative.    Physical Exam: Blood pressure 132/80, pulse (!) 54, temperature 98 F (36.7 C), resp. rate 16, weight 172 lb (78 kg), SpO2 98 %., Body mass index is 32.5 kg/m. General:  WNWD WF. Appears in no acute distress. Neck: Supple. No thyromegaly. No lymphadenopathy. Lungs: Clear bilaterally to auscultation without wheezes, rales, or rhonchi. Breathing is unlabored. Heart: Regular rhythm. No murmurs, rubs, or gallops. Skin: Under left breast---there is approx 2 inch diameter area of diffuse pink erythema with small vessicles. Right forearm---there is approx 1 inch diameter area of pink erythema with papules. There are smaller splotches of pink erythema scattered on both arms and at both lower legs. Left eyelid has approximate 1 cm area of some mild swelling and light pink erythema. No other areas of rash on the face. Neuro: Alert and oriented X 3. Moves all extremities spontaneously. Gait is normal. CNII-XII grossly in tact. Psych:  Responds to questions appropriately with a normal affect.     ASSESSMENT AND PLAN:  70 y.o. year old female with  1. Allergic dermatitis We'll give Depo-Medrol 80 mg IM. She is to follow-up if rash does  not resolve. - methylPREDNISolone acetate (DEPO-MEDROL) injection 80 mg; Inject 1 mL (80 mg total) into the muscle once.   [address] [place], [place], [facility] 06/03/2016 12:35 PM

## 2016-06-08 ENCOUNTER — Telehealth: Payer: Self-pay | Admitting: Family Medicine

## 2016-06-08 NOTE — Telephone Encounter (Signed)
Can take prednisone taper pack.

## 2016-06-08 NOTE — Telephone Encounter (Signed)
Seen last week for poison ivy.  Given Depo medrol 80 mg injection.  Rash has improved very little and is spreading.  Itch is driving her crazy.  Has been taking Benadryl and using Benadryl cream.  Please advise??

## 2016-06-09 ENCOUNTER — Telehealth: Payer: Self-pay

## 2016-06-09 MED ORDER — PREDNISONE 20 MG PO TABS: ORAL_TABLET | ORAL | 0 refills | Status: DC

## 2016-06-09 NOTE — Telephone Encounter (Signed)
Medication called/sent to requested pharmacy and pt aware via vm 

## 2016-06-09 NOTE — Telephone Encounter (Signed)
Pt called and states she still has poison ivy and is still itching and req RX be called in for Predinsone. Offered to sch  pt follow up appt since predinsone shot did not help/ Pt states she has been taking benadryl and benadryl  Cream and it has helped a little and she will see how she feels later in week before making an appt

## 2016-06-17 ENCOUNTER — Other Ambulatory Visit: Payer: Self-pay | Admitting: Physician Assistant

## 2016-06-17 NOTE — Telephone Encounter (Signed)
RX refilled per protocol 

## 2016-06-22 ENCOUNTER — Emergency Department (HOSPITAL_COMMUNITY)
Admission: EM | Admit: 2016-06-22 | Discharge: 2016-06-22 | Disposition: A | Discharge status: Home / Self Care | Payer: Medicare Other | Attending: Emergency Medicine | Admitting: Emergency Medicine

## 2016-06-22 ENCOUNTER — Encounter (HOSPITAL_COMMUNITY): Payer: Self-pay | Admitting: Emergency Medicine

## 2016-06-22 ENCOUNTER — Emergency Department (HOSPITAL_COMMUNITY): Payer: Medicare Other

## 2016-06-22 DIAGNOSIS — I10 Essential (primary) hypertension: Secondary | ICD-10-CM | POA: Diagnosis not present

## 2016-06-22 DIAGNOSIS — E039 Hypothyroidism, unspecified: Secondary | ICD-10-CM | POA: Diagnosis not present

## 2016-06-22 DIAGNOSIS — J449 Chronic obstructive pulmonary disease, unspecified: Secondary | ICD-10-CM | POA: Diagnosis not present

## 2016-06-22 DIAGNOSIS — G935 Compression of brain: Secondary | ICD-10-CM | POA: Diagnosis not present

## 2016-06-22 DIAGNOSIS — Z955 Presence of coronary angioplasty implant and graft: Secondary | ICD-10-CM | POA: Insufficient documentation

## 2016-06-22 DIAGNOSIS — R42 Dizziness and giddiness: Secondary | ICD-10-CM | POA: Diagnosis not present

## 2016-06-22 DIAGNOSIS — Z79899 Other long term (current) drug therapy: Secondary | ICD-10-CM | POA: Diagnosis not present

## 2016-06-22 DIAGNOSIS — F17210 Nicotine dependence, cigarettes, uncomplicated: Secondary | ICD-10-CM | POA: Insufficient documentation

## 2016-06-22 DIAGNOSIS — Q0700 Arnold-Chiari syndrome without spina bifida or hydrocephalus: Secondary | ICD-10-CM | POA: Diagnosis not present

## 2016-06-22 DIAGNOSIS — I2510 Atherosclerotic heart disease of native coronary artery without angina pectoris: Secondary | ICD-10-CM | POA: Diagnosis not present

## 2016-06-22 DIAGNOSIS — R404 Transient alteration of awareness: Secondary | ICD-10-CM | POA: Diagnosis not present

## 2016-06-22 LAB — URINALYSIS, ROUTINE W REFLEX MICROSCOPIC
BILIRUBIN URINE: NEGATIVE
GLUCOSE, UA: NEGATIVE mg/dL
HGB URINE DIPSTICK: NEGATIVE
Ketones, ur: NEGATIVE mg/dL
Leukocytes, UA: NEGATIVE
Nitrite: NEGATIVE
Protein, ur: NEGATIVE mg/dL
SPECIFIC GRAVITY, URINE: 1.02 (ref 1.005–1.030)
pH: 6 (ref 5.0–8.0)

## 2016-06-22 LAB — I-STAT TROPONIN, ED: TROPONIN I, POC: 0 ng/mL (ref 0.00–0.08)

## 2016-06-22 LAB — BASIC METABOLIC PANEL
Anion gap: 6 (ref 5–15)
BUN: 18 mg/dL (ref 6–20)
CHLORIDE: 106 mmol/L (ref 101–111)
CO2: 27 mmol/L (ref 22–32)
CREATININE: 0.89 mg/dL (ref 0.44–1.00)
Calcium: 9.2 mg/dL (ref 8.9–10.3)
GFR calc Af Amer: >60 mL/min (ref >60)
GFR calc non Af Amer: >60 mL/min (ref >60)
GLUCOSE: 158 mg/dL — AB (ref 65–99)
Potassium: 4.1 mmol/L (ref 3.5–5.1)
SODIUM: 139 mmol/L (ref 135–145)

## 2016-06-22 LAB — CBC
HCT: 40.6 % (ref 36.0–46.0)
HEMOGLOBIN: 13.3 g/dL (ref 12.0–15.0)
MCH: 31.4 pg (ref 26.0–34.0)
MCHC: 32.8 g/dL (ref 30.0–36.0)
MCV: 95.8 fL (ref 78.0–100.0)
PLATELETS: 323 10*3/uL (ref 150–400)
RBC: 4.24 MIL/uL (ref 3.87–5.11)
RDW: 14.2 % (ref 11.5–15.5)
WBC: 9.5 10*3/uL (ref 4.0–10.5)

## 2016-06-22 MED ORDER — MECLIZINE HCL 25 MG PO TABS
25.0000 mg | ORAL_TABLET | Freq: Once | ORAL | Status: AC
  Administered 2016-06-22: 25 mg via ORAL
  Filled 2016-06-22: qty 1

## 2016-06-22 MED ORDER — ONDANSETRON 4 MG PO TBDP
4.0000 mg | ORAL_TABLET | Freq: Once | ORAL | Status: AC
  Administered 2016-06-22: 4 mg via ORAL
  Filled 2016-06-22: qty 1

## 2016-06-22 MED ORDER — SODIUM CHLORIDE 0.9 % IV BOLUS (SEPSIS)
1000.0000 mL | Freq: Once | INTRAVENOUS | Status: AC
  Administered 2016-06-22: 1000 mL via INTRAVENOUS

## 2016-06-22 MED ORDER — DIAZEPAM 5 MG/ML IJ SOLN
2.5000 mg | Freq: Once | INTRAMUSCULAR | Status: AC
  Administered 2016-06-22: 2.5 mg via INTRAVENOUS
  Filled 2016-06-22: qty 2

## 2016-06-22 MED ORDER — ONDANSETRON HCL 4 MG/2ML IJ SOLN
4.0000 mg | Freq: Once | INTRAMUSCULAR | Status: AC
  Administered 2016-06-22: 4 mg via INTRAVENOUS
  Filled 2016-06-22: qty 2

## 2016-06-22 MED ORDER — MECLIZINE HCL 25 MG PO TABS: 25.0000 mg | ORAL_TABLET | Freq: Three times a day (TID) | ORAL | 0 refills | Status: DC | PRN

## 2016-06-22 NOTE — ED Triage Notes (Signed)
Pt in EMS, went to bed around 2000 C/O N/V, woke up around 0315 reporting dizziness, worsens with movement. Given 4mg  zofran en route. Denies pain, VSS, CBG 105.

## 2016-06-22 NOTE — ED Notes (Signed)
Pt ambulatory with steady gait at this time. Son to transport patient home. A/O x4. NAD. Verbalizes understanding of discharge instructions and follow up information.

## 2016-06-22 NOTE — Discharge Instructions (Signed)
You were seen today for dizziness. You have vertigo. Your MRI did not show any evidence of stroke and her vertigo is likely related to inner ear problems.  You were incidentally found to have a mild type I Chiari malformation. This is of unknown clinical significance. Follow-up your primary physician.

## 2016-06-22 NOTE — ED Notes (Signed)
Requested urine sample from pt ? ?

## 2016-06-22 NOTE — ED Notes (Signed)
While assisting patient getting dressed patient unable to stand without swaying due to continued dizziness associated with nausea. VSS. MD Horton made aware. Discharge postponed at this time. Given orders to give additional dose of meclizine and zofran.

## 2016-06-22 NOTE — ED Provider Notes (Signed)
[facility] DEPT Provider Note   CSN: [id number] Arrival date & time: 06/22/16  0356     History   Chief Complaint Chief Complaint  Patient presents with  . Nausea  . Dizziness    HPI Alexa Orozco is a 70 y.o. female.  HPI  This is a 70 year old female with history of coronary artery disease, hyperlipidemia, smoking, hypertension who presents with vertigo. Patient reports that she noted mild room spinning dizziness prior to going to bed. She states onset of symptoms was approximately 8 PM. She woke up just prior to arrival with intense room spinning dizziness. She reports associated nausea and vomiting. She states that she was unable to ambulate independently to the restroom because of being off balance. She denies speech difficulty, weakness, numbness, tingling of the lower extremity's. She has no history of vertigo. No recent illnesses or fevers. Patient reports symptoms are worse with position changes.  Past Medical History:  Diagnosis Date  . Allergy   . Asthma   . CAD S/P percutaneous coronary angioplasty February 2007   Class III Angina --> Cath: 85% -- PCI 3.63mm x 1mm (4.0 mm) Cypher DES to RCA; Myoview October 2014: normal stress test: LOW RISK; mild anteroapical breast attenuation  . Cataract    h/o bilat repair  . Depression   . Depression 11/29/2013  . Dyslipidemia   . Emphysema of lung ([place])   . GERD (gastroesophageal reflux disease)   . History of palpitations   . History of tobacco abuse   . Hypertension    well-controlled  . Hypothyroidism   . Osteoporosis   . Raynaud disease     Patient Active Problem List   Diagnosis Date Noted  . Insomnia 09/19/2014  . Generalized anxiety disorder 09/19/2014  . COPD exacerbation ([place]) 08/20/2014  . Depression 11/29/2013  . Osteoporosis   . Claudication ([place]) 06/01/2013  . Obesity (BMI 30-39.9) 05/31/2013  . Stable angina ([place]) 05/31/2013  . Claudication of left lower extremity ([place]) 05/31/2013  . CAD S/P  percutaneous coronary angioplasty   . Dyslipidemia   . Lymphadenitis 03/21/2013  . Hirsutism 03/03/2011  . Essential hypertension   . Hypothyroid   . Raynaud's disease   . COPD (chronic obstructive pulmonary disease) ([place])   . Tobacco abuse     Past Surgical History:  Procedure Laterality Date  . ABDOMINAL HYSTERECTOMY     h/o partila hysterectomy  . BREAST BIOPSY  1988  . CORONARY ANGIOPLASTY WITH STENT PLACEMENT  10/14/2005   cypher DES (3.38mmx18mm) to high grade RCA lesion  . NM MYOVIEW LTD  July 2012 of October 2014   '12: dipyridamole; Normal, low risk study; 2014 - normal stress test: LOW RISK; mild anteroapical breast attenuation  . TONSILLECTOMY  1959  . TRANSTHORACIC ECHOCARDIOGRAM  05/22/2009   EF=>55%, normal LV systolic function; normal RV systolic function; mild MR/TR    OB History    No data available       Home Medications    Prior to Admission medications   Medication Sig Start Date End Date Taking? Authorizing Provider  albuterol (PROVENTIL HFA;VENTOLIN HFA) 108 (90 BASE) MCG/ACT inhaler Inhale 2 puffs into the lungs every 4 (four) hours as needed for wheezing or shortness of breath. 06/20/15  Yes Mary B Dixon, PA-C  atorvastatin (LIPITOR) 40 MG tablet TAKE 1 TABLET BY MOUTH EVERY DAY 05/25/16  Yes Leonie Man, MD  clopidogrel (PLAVIX) 75 MG tablet TAKE 1 TABLET BY MOUTH EVERY DAY NEEDS OFFICE VISIT 03/30/16  Yes Lorretta Harp, MD  cromolyn (OPTICROM) 4 % ophthalmic solution Place 1 drop into both eyes 4 (four) times daily. 06/16/16  Yes Historical Provider, MD  DULoxetine (CYMBALTA) 60 MG capsule TAKE 1 CAPSULE (60 MG TOTAL) BY MOUTH DAILY. 06/17/16  Yes Orlena Sheldon, PA-C  estradiol (ESTRACE) 1 MG tablet TAKE 1 TABLET BY MOUTH DAILY 02/24/16  Yes Orlena Sheldon, PA-C  levothyroxine (SYNTHROID, LEVOTHROID) 88 MCG tablet TAKE 1 TABLET BY MOUTH EVERY DAY 05/25/16  Yes Orlena Sheldon, PA-C  nitroGLYCERIN (NITROSTAT) 0.4 MG SL tablet Place 1 tablet (0.4 mg total)  under the tongue every 5 (five) minutes as needed for chest pain. 05/31/13  Yes Leonie Man, MD  pantoprazole (PROTONIX) 40 MG tablet TAKE 1 TABLET BY MOUTH EVERY DAY 02/20/16  Yes Orlena Sheldon, PA-C  tiotropium (SPIRIVA HANDIHALER) 18 MCG inhalation capsule PLACE 1 CAPSULE (18 MCG TOTAL) INTO INHALER AND INHALE DAILY. 06/20/15  Yes Mary B Dixon, PA-C  Eszopiclone 3 MG TABS TAKE 1 TABLET BY MOUTH AT BEDTIME AS NEEDED Patient not taking: Reported on 06/22/2016 01/22/16   Orlena Sheldon, PA-C  meclizine (ANTIVERT) 25 MG tablet Take 1 tablet (25 mg total) by mouth 3 (three) times daily as needed for dizziness. 06/22/16   Merryl Hacker, MD  predniSONE (DELTASONE) 20 MG tablet 3 tabs po qd x 2 days, 2 tabs po qd x 2 days, 1 tab po qd x 2 days Patient not taking: Reported on 06/22/2016 06/09/16   Susy Frizzle, MD    Family History Family History  Problem Relation Age of Onset  . Liver cancer Mother   . Heart attack Mother   . Heart Problems Sister     Social History Social History  Substance Use Topics  . Smoking status: Current Every Day Smoker    Packs/day: 0.50    Years: 35.00    Types: Cigarettes  . Smokeless tobacco: Never Used  . Alcohol use No     Allergies   Bystolic [nebivolol hcl] and Cephalexin   Review of Systems Review of Systems  Constitutional: Negative for fever.  HENT: Negative for ear pain.   Respiratory: Negative for shortness of breath.   Cardiovascular: Negative for chest pain.  Gastrointestinal: Positive for nausea and vomiting. Negative for abdominal pain and diarrhea.  Musculoskeletal: Positive for gait problem.  Neurological: Positive for dizziness. Negative for speech difficulty, weakness and numbness.  All other systems reviewed and are negative.    Physical Exam Updated Vital Signs BP 129/69   Pulse 63   Temp 97.8 F (36.6 C)   Resp 17   Ht 5\' 1"  (1.549 m)   Wt 175 lb (79.4 kg)   SpO2 99%   BMI 33.07 kg/m   Physical Exam    Constitutional: She is oriented to person, place, and time. She appears well-developed and well-nourished.  Elderly, no acute distress  HENT:  Head: Normocephalic and atraumatic.  Eyes: EOM are normal. Pupils are equal, round, and reactive to light.  No nystagmus noted  Neck: Neck supple.  Cardiovascular: Normal rate, regular rhythm and normal heart sounds.   No murmur heard. Pulmonary/Chest: Effort normal and breath sounds normal. No respiratory distress. She has no wheezes.  Abdominal: Soft. Bowel sounds are normal. There is no tenderness. There is no guarding.  Neurological: She is alert and oriented to person, place, and time.  Cranial nerves II through XII intact, no dysmetria to finger-nose-finger, 55 strength in all 4  extremities, gait testing deferred  Skin: Skin is warm and dry.  Psychiatric: She has a normal mood and affect.  Nursing note and vitals reviewed.    ED Treatments / Results  Labs (all labs ordered are listed, but only abnormal results are displayed) Labs Reviewed  URINALYSIS, ROUTINE W REFLEX MICROSCOPIC (NOT AT [facility]) - Abnormal; Notable for the following:       Result Value   APPearance CLOUDY (*)    All other components within normal limits  BASIC METABOLIC PANEL - Abnormal; Notable for the following:    Glucose, Bld 158 (*)    All other components within normal limits  CBC  I-STAT TROPOININ, ED    EKG  EKG Interpretation  Date/Time:  Monday June 22 2016 04:03:30 EDT Ventricular Rate:  52 PR Interval:    QRS Duration: 95 QT Interval:  453 QTC Calculation: 422 R Axis:   88 Text Interpretation:  Sinus rhythm Borderline right axis deviation Low voltage, precordial leads Confirmed by Dina Rich  MD, Loma Sousa ([id number]) on 06/22/2016 4:48:31 AM       Radiology Mr Brain Wo Contrast  Result Date: 06/22/2016 CLINICAL DATA:  Acute onset vertigo at 8 p.m. yesterday. History of hypertension, angina. EXAM: MRI HEAD WITHOUT CONTRAST TECHNIQUE:  Multiplanar, multiecho pulse sequences of the brain and surrounding structures were obtained without intravenous contrast. COMPARISON:  None. FINDINGS: BRAIN: No reduced diffusion to suggest acute ischemia. No susceptibility artifact to suggest hemorrhage. The ventricles and sulci are normal for patient's age. Patchy pontine and to lesser extent supratentorial white matter FLAIR T2 hyperintensities compatible with chronic small vessel ischemic disease, less than expected for age. No suspicious parenchymal signal, masses or mass effect. No abnormal extra-axial fluid collections. No extra-axial masses though, contrast enhanced sequences would be more sensitive. VASCULAR: Normal major intracranial vascular flow voids present at skull base. SKULL AND UPPER CERVICAL SPINE: No abnormal sellar expansion. No suspicious calvarial bone marrow signal. Craniocervical junction maintained. Cerebellar tonsils descend 9 mm below the foramen magnum, and are pointed in appearance effacing the cerebral spinal fluid space. SINUSES/ORBITS: Trace LEFT mastoid effusion. Paranasal sinuses the RIGHT mastoid air cells are well aerated. Status post bilateral ocular lens implants. The included ocular globes and orbital contents are non-suspicious. OTHER: Patient is edentulous. IMPRESSION: No acute intracranial process. 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 Electronically Signed   By: Elon Alas M.D.   On: 06/22/2016 06:50    Procedures Procedures (including critical care time)  Medications Ordered in ED Medications  sodium chloride 0.9 % bolus 1,000 mL (0 mLs Intravenous Stopped 06/22/16 0533)  ondansetron (ZOFRAN) injection 4 mg (4 mg Intravenous Given 06/22/16 0429)  diazepam (VALIUM) injection 2.5 mg (2.5 mg Intravenous Given 06/22/16 0429)  meclizine (ANTIVERT) tablet 25 mg (25 mg Oral Given 06/22/16 0429)     Initial Impression / Assessment and Plan / ED Course  I have reviewed the  triage vital signs and the nursing notes.  Pertinent labs & imaging results that were available during my care of the patient were reviewed by me and considered in my medical decision making (see chart for details).  Clinical Course  Comment By Time  Some improvement with meds.  Merryl Hacker, MD 10/23 [phone]    Patient presents with vertigo. Reports nausea vomiting.  Considerations include peripheral versus central vertigo.  Features most consistent with peripheral vertigo. Patient given Valium and meclizine. Labwork reassuring. She is not orthostatic. Given age, will obtain MRI to  rule out central cause. MRI reassuring. No stroke. She does have a small Chiari malformation of unknown clinical significance. I discussed this diagnosis with patient. Upon discharge, she got dizzy with ambulation. She was premedicated. She will be reassessed for discharge by my colleague.  After history, exam, and medical workup I feel the patient has been appropriately medically screened and is safe for discharge home. Pertinent diagnoses were discussed with the patient. Patient was given return precautions.   Final Clinical Impressions(s) / ED Diagnoses   Final diagnoses:  Vertigo  Chiari malformation type I (HCC)    New Prescriptions New Prescriptions   MECLIZINE (ANTIVERT) 25 MG TABLET    Take 1 tablet (25 mg total) by mouth 3 (three) times daily as needed for dizziness.     Merryl Hacker, MD 06/22/16 [phone]

## 2016-06-22 NOTE — ED Notes (Signed)
EDP at bedside  

## 2016-06-25 ENCOUNTER — Ambulatory Visit (INDEPENDENT_AMBULATORY_CARE_PROVIDER_SITE_OTHER): Payer: Medicare Other | Admitting: Physician Assistant

## 2016-06-25 ENCOUNTER — Encounter: Payer: Self-pay | Admitting: Physician Assistant

## 2016-06-25 VITALS — BP 146/74 | HR 63 | Temp 98.1°F | Resp 16 | Ht 61.0 in | Wt 171.0 lb

## 2016-06-25 DIAGNOSIS — H8110 Benign paroxysmal vertigo, unspecified ear: Secondary | ICD-10-CM

## 2016-06-25 DIAGNOSIS — I208 Other forms of angina pectoris: Secondary | ICD-10-CM | POA: Diagnosis not present

## 2016-06-25 NOTE — Progress Notes (Signed)
Patient ID: Alexa Orozco MRN: [id number], DOB: December 07, 1945, 70 y.o. Date of Encounter: 06/25/2016, 2:14 PM    Chief Complaint:  Chief Complaint  Patient presents with  . Dizziness     HPI: 70 y.o. year old white female 's here for ER follow-up.  I reviewed her ER note from 06/22/16. At that time she reported that she had been noticing "room spinning prior to going to bed. Said that the onset of symptoms was approximately 8 PM. She woke up just prior to arrival to the ER with intense room spinning and dizziness. She reported associated nausea and vomiting. Said that she was unable to ambulate independently to the restroom because of feeling off balance. Denied speech difficulty, weakness, numbness, tingling. She had no prior history of vertigo. No recent illnesses or fevers. She reported that the symptoms were worse with position changes. EKG showed sinus rhythm. MRI brain showed no acute intracranial process. Mild chiari 1 malformation. Mild chronic small vessel ischemic disease. She was treated with Zofran and Valium and was given prescription for Antivert. She was felt this stable for discharge with prescription for meclizine.  Today she reports that her symptoms are much better today. Says that she has required no medication today. Even with no medication, she is having no further spinning sensation/vertigo. Says that today she feels just very slightly "on a boat feeling" --- "that's all". Says that she feels so much better.     Home Meds:   Outpatient Medications Prior to Visit  Medication Sig Dispense Refill  . albuterol (PROVENTIL HFA;VENTOLIN HFA) 108 (90 BASE) MCG/ACT inhaler Inhale 2 puffs into the lungs every 4 (four) hours as needed for wheezing or shortness of breath. 1 Inhaler 3  . atorvastatin (LIPITOR) 40 MG tablet TAKE 1 TABLET BY MOUTH EVERY DAY 90 tablet 3  . clopidogrel (PLAVIX) 75 MG tablet TAKE 1 TABLET BY MOUTH EVERY DAY *NEEDS OFFICE VISIT* 90 tablet 3  .  cromolyn (OPTICROM) 4 % ophthalmic solution Place 1 drop into both eyes 4 (four) times daily.    . DULoxetine (CYMBALTA) 60 MG capsule TAKE 1 CAPSULE (60 MG TOTAL) BY MOUTH DAILY. 90 capsule 3  . estradiol (ESTRACE) 1 MG tablet TAKE 1 TABLET BY MOUTH DAILY 90 tablet 1  . Eszopiclone 3 MG TABS TAKE 1 TABLET BY MOUTH AT BEDTIME AS NEEDED 30 tablet 5  . levothyroxine (SYNTHROID, LEVOTHROID) 88 MCG tablet TAKE 1 TABLET BY MOUTH EVERY DAY 90 tablet 1  . meclizine (ANTIVERT) 25 MG tablet Take 1 tablet (25 mg total) by mouth 3 (three) times daily as needed for dizziness. 30 tablet 0  . nitroGLYCERIN (NITROSTAT) 0.4 MG SL tablet Place 1 tablet (0.4 mg total) under the tongue every 5 (five) minutes as needed for chest pain. 25 tablet 6  . pantoprazole (PROTONIX) 40 MG tablet TAKE 1 TABLET BY MOUTH EVERY DAY 90 tablet 1  . tiotropium (SPIRIVA HANDIHALER) 18 MCG inhalation capsule PLACE 1 CAPSULE (18 MCG TOTAL) INTO INHALER AND INHALE DAILY. 30 capsule 5  . predniSONE (DELTASONE) 20 MG tablet 3 tabs po qd x 2 days, 2 tabs po qd x 2 days, 1 tab po qd x 2 days (Patient not taking: Reported on 06/25/2016) 12 tablet 0   No facility-administered medications prior to visit.      Allergies:  Allergies  Allergen Reactions  . Bystolic [Nebivolol Hcl] Hives  . Cephalexin Hives      Review of Systems: See HPI for pertinent ROS.  All other ROS negative.    Physical Exam: Blood pressure (!) 146/74, pulse 63, temperature 98.1 F (36.7 C), temperature source Oral, resp. rate 16, height 5\' 1"  (1.549 m), weight 171 lb (77.6 kg), SpO2 98 %., Body mass index is 32.31 kg/m. General: WNWD WF.  Appears in no acute distress.  Neck: Supple. No thyromegaly. No lymphadenopathy. Lungs: Clear bilaterally to auscultation without wheezes, rales, or rhonchi. Breathing is unlabored. Heart: Regular rhythm. No murmurs, rubs, or gallops. Msk:  Strength and tone normal for age. Extremities/Skin: Warm and dry.  Neuro: Alert and  oriented X 3. Moves all extremities spontaneously. Gait is normal. CNII-XII grossly in tact. Psych:  Responds to questions appropriately with a normal affect.     ASSESSMENT AND PLAN:  70 y.o. year old female with  1. Benign paroxysmal positional vertigo, unspecified laterality Symptoms are already resolving. Told her that if symptoms reoccur to use the meclizine PRN.  If symptoms recur and persist greater than one week, then follow-up with [place].   Marin Olp [place], [place], [facility] 06/25/2016 2:14 PM

## 2016-07-14 ENCOUNTER — Telehealth: Payer: Self-pay | Admitting: *Deleted

## 2016-07-14 NOTE — Telephone Encounter (Signed)
Received call from patient.   Requested refill on Tessalon and also requested order for smoking cessation patches.   Ok to order?

## 2016-07-15 MED ORDER — NICOTINE 14 MG/24HR TD PT24: 14.0000 mg | MEDICATED_PATCH | Freq: Every day | TRANSDERMAL | 0 refills | Status: DC

## 2016-07-15 MED ORDER — NICOTINE 7 MG/24HR TD PT24: 7.0000 mg | MEDICATED_PATCH | Freq: Every day | TRANSDERMAL | 0 refills | Status: DC

## 2016-07-15 MED ORDER — BENZONATATE 200 MG PO CAPS: 200.0000 mg | ORAL_CAPSULE | Freq: Three times a day (TID) | ORAL | 1 refills | Status: DC | PRN

## 2016-07-15 MED ORDER — NICOTINE 21 MG/24HR TD PT24: 21.0000 mg | MEDICATED_PATCH | Freq: Every day | TRANSDERMAL | 0 refills | Status: DC

## 2016-07-15 NOTE — Telephone Encounter (Signed)
Usually can get the patches for smoking cessation over-the-counter. If she does need Rx for this-- then we need to find out how much she has been recently smoking to know which dose of the patch to start with. (I reviewed her chart but I have had no office visit with her recently and which we have discussed amount of smoking) Rx for Tessalon approved. Tessalon 200 mg 1 by mouth 3 times a day as needed for cough suppressant. #30+1 refill.

## 2016-07-15 NOTE — Telephone Encounter (Signed)
Rx filled

## 2016-07-15 NOTE — Telephone Encounter (Signed)
NicoDerm 21mg /24 h for 4 weeks then       14mg /24 h for 4 weeks then       7mg  / 24 h for 4 weeks

## 2016-07-15 NOTE — Telephone Encounter (Signed)
Pt states if she has an Rx  For the patch her insurance will pay. She smokes about 1 1/2 packs a day

## 2016-07-16 DIAGNOSIS — Z23 Encounter for immunization: Secondary | ICD-10-CM | POA: Diagnosis not present

## 2016-07-27 ENCOUNTER — Other Ambulatory Visit: Payer: Self-pay | Admitting: Physician Assistant

## 2016-07-27 NOTE — Telephone Encounter (Signed)
Rx faxed

## 2016-07-27 NOTE — Telephone Encounter (Signed)
Last Ov 10-26 Last refill 5-24 Okay to refill?

## 2016-07-27 NOTE — Telephone Encounter (Signed)
Approved. #[id number]. 

## 2016-08-06 ENCOUNTER — Telehealth: Payer: Self-pay

## 2016-08-06 ENCOUNTER — Other Ambulatory Visit: Payer: Self-pay | Admitting: Physician Assistant

## 2016-08-06 NOTE — Telephone Encounter (Signed)
Pt states she tried the nicotine patch and it caused her to have an allergic reaction. Pt states when she removed the patch from various places that it burned her skin and caused a rash where the patch had been placed.  Pt is asking if she can try Chantix? Pls advise

## 2016-08-06 NOTE — Telephone Encounter (Signed)
She needs to come in for office visit so we can discuss. (Reviewed chart. Has history of anxiety, depression etc. is on Cymbalta.)

## 2016-08-07 NOTE — Telephone Encounter (Signed)
Spoke with pt she stated she will call [facility] and make an appt

## 2016-08-17 ENCOUNTER — Other Ambulatory Visit: Payer: Self-pay | Admitting: Physician Assistant

## 2016-08-17 NOTE — Telephone Encounter (Signed)
Rx filler per protocol

## 2016-08-29 ENCOUNTER — Other Ambulatory Visit: Payer: Self-pay | Admitting: Physician Assistant

## 2016-09-17 ENCOUNTER — Ambulatory Visit: Payer: Medicare Other | Admitting: Podiatry

## 2016-09-21 ENCOUNTER — Ambulatory Visit (INDEPENDENT_AMBULATORY_CARE_PROVIDER_SITE_OTHER): Payer: Medicare Other | Admitting: Podiatry

## 2016-09-21 ENCOUNTER — Encounter: Payer: Self-pay | Admitting: Podiatry

## 2016-09-21 DIAGNOSIS — M722 Plantar fascial fibromatosis: Secondary | ICD-10-CM

## 2016-09-21 MED ORDER — TRIAMCINOLONE ACETONIDE 10 MG/ML IJ SUSP
10.0000 mg | Freq: Once | INTRAMUSCULAR | Status: AC
  Administered 2016-09-21: 10 mg

## 2016-09-22 NOTE — Progress Notes (Signed)
Subjective:     Patient ID: Alexa Orozco, female   DOB: [birth year], 71 y.o.   MRN: [id number]  HPI patient states that she started develop a lot of pain in the plantar aspect of the left heel again   Review of Systems     Objective:   Physical Exam Neurovascular status intact with patient found to have discomfort plantar left heel insertional point    Assessment:     Acute plantar fasciitis left    Plan:     Reinjected the plantar fascial left 3 mg Kenalog 5 mill grams Xylocaine and instructed on physical therapy and reappoint to recheck again in 4 weeks or earlier if needed

## 2016-09-29 ENCOUNTER — Other Ambulatory Visit: Payer: Self-pay | Admitting: Physician Assistant

## 2016-09-29 NOTE — Telephone Encounter (Signed)
Rx filled per protocol  

## 2016-10-08 ENCOUNTER — Ambulatory Visit (INDEPENDENT_AMBULATORY_CARE_PROVIDER_SITE_OTHER): Payer: Medicare Other | Admitting: Physician Assistant

## 2016-10-08 ENCOUNTER — Encounter: Payer: Self-pay | Admitting: Physician Assistant

## 2016-10-08 VITALS — BP 130/70 | HR 60 | Temp 97.3°F | Resp 16 | Wt 174.0 lb

## 2016-10-08 DIAGNOSIS — J988 Other specified respiratory disorders: Secondary | ICD-10-CM | POA: Diagnosis not present

## 2016-10-08 DIAGNOSIS — F411 Generalized anxiety disorder: Secondary | ICD-10-CM | POA: Diagnosis not present

## 2016-10-08 DIAGNOSIS — F339 Major depressive disorder, recurrent, unspecified: Secondary | ICD-10-CM

## 2016-10-08 DIAGNOSIS — B9689 Other specified bacterial agents as the cause of diseases classified elsewhere: Principal | ICD-10-CM

## 2016-10-08 MED ORDER — VORTIOXETINE HBR 10 MG PO TABS: 1.0000 | ORAL_TABLET | Freq: Every day | ORAL | 1 refills | Status: DC

## 2016-10-08 MED ORDER — AZITHROMYCIN 250 MG PO TABS: ORAL_TABLET | ORAL | 0 refills | Status: DC

## 2016-10-08 NOTE — Progress Notes (Signed)
Patient ID: Alexa Orozco MRN: [id number], DOB: 1946-02-10, 71 y.o. Date of Encounter: @DATE @  Chief Complaint:  Chief Complaint  Patient presents with  . Cough    x3wks    HPI: 71 y.o. year old female  presents with above.   Reports that she has had cough for about 3 weeks now. Also has some drainage from her nose and postnasal drainage. Has had no fevers. No sore throat.  Also states that she wanted to talk to me about "feeling anxiety attacks ". Also says that she "hasn't been wanting to leave her house", hasn't been wanting to do anything. "Just feels like she has no laughter anymore."  Says that she catches herself holding one hand with the other, "feeling like something is about to happen ". Asked if she feels that there is some thing going on in her life triggering this or whether things in her life have been stable. Says that she has "had problems with 2 of her children -- that is causing her increased stress ".  She also mentions wanting some Chantix to help her quit smoking.   Past Medical History:  Diagnosis Date  . Allergy   . Asthma   . CAD S/P percutaneous coronary angioplasty February 2007   Class III Angina --> Cath: 85% -- PCI 3.53mm x 41mm (4.0 mm) Cypher DES to RCA; Myoview October 2014: normal stress test: LOW RISK; mild anteroapical breast attenuation  . Cataract    h/o bilat repair  . Depression   . Depression 11/29/2013  . Dyslipidemia   . Emphysema of lung ([place])   . GERD (gastroesophageal reflux disease)   . History of palpitations   . History of tobacco abuse   . Hypertension    well-controlled  . Hypothyroidism   . Osteoporosis   . Raynaud disease      Home Meds: Outpatient Medications Prior to Visit  Medication Sig Dispense Refill  . atorvastatin (LIPITOR) 40 MG tablet TAKE 1 TABLET BY MOUTH EVERY DAY 90 tablet 3  . benzonatate (TESSALON) 200 MG capsule TAKE ONE CAPSULE BY MOUTH 3 TIMES A DAY AS NEEDED FOR COUGH 30 capsule 1  . clopidogrel  (PLAVIX) 75 MG tablet TAKE 1 TABLET BY MOUTH EVERY DAY NEEDS OFFICE VISIT 90 tablet 3  . cromolyn (OPTICROM) 4 % ophthalmic solution Place 1 drop into both eyes 4 (four) times daily.    Marland Kitchen estradiol (ESTRACE) 1 MG tablet TAKE 1 TABLET BY MOUTH DAILY 90 tablet 1  . Eszopiclone 3 MG TABS TAKE 1 TABLET BY MOUTH AT BEDTIME AS NEEDED 30 tablet 3  . levothyroxine (SYNTHROID, LEVOTHROID) 88 MCG tablet TAKE 1 TABLET BY MOUTH EVERY DAY 90 tablet 1  . meclizine (ANTIVERT) 25 MG tablet Take 1 tablet (25 mg total) by mouth 3 (three) times daily as needed for dizziness. 30 tablet 0  . nicotine (NICODERM CQ) 14 mg/24hr patch Place 1 patch (14 mg total) onto the skin daily. Use this path after completing the 21mg  /24h for 4 weeks 28 patch 0  . nicotine (NICODERM CQ) 21 mg/24hr patch Place 1 patch (21 mg total) onto the skin daily. Use this patch 1st 28 patch 0  . nicotine (NICODERM CQ) 7 mg/24hr patch Place 1 patch (7 mg total) onto the skin daily. Use this patch after using 14 mg/24h for 4 weeks 28 patch 0  . nitroGLYCERIN (NITROSTAT) 0.4 MG SL tablet Place 1 tablet (0.4 mg total) under the tongue every 5 (five) minutes  as needed for chest pain. 25 tablet 6  . pantoprazole (PROTONIX) 40 MG tablet TAKE 1 TABLET BY MOUTH EVERY DAY 90 tablet 1  . predniSONE (DELTASONE) 20 MG tablet 3 tabs po qd x 2 days, 2 tabs po qd x 2 days, 1 tab po qd x 2 days 12 tablet 0  . PROAIR HFA 108 (90 Base) MCG/ACT inhaler INHALE 2 PUFFS INTO THE LUNGS EVERY 4 (FOUR) HOURS AS NEEDED FOR WHEEZING OR SHORTNESS OF BREATH. 8.5 Inhaler 3  . SPIRIVA HANDIHALER 18 MCG inhalation capsule PLACE 1 CAPSULE (18 MCG TOTAL) INTO INHALER AND INHALE DAILY. 30 capsule 4  . DULoxetine (CYMBALTA) 60 MG capsule TAKE 1 CAPSULE (60 MG TOTAL) BY MOUTH DAILY. 90 capsule 3   No facility-administered medications prior to visit.     Allergies:  Allergies  Allergen Reactions  . Bystolic [Nebivolol Hcl] Hives  . Cephalexin Hives    Social History   Social  History  . Marital status: Widowed    Spouse name: N/A  . Number of children: 3  . Years of education: 10th    Occupational History  . Retired    Social History Main Topics  . Smoking status: Current Every Day Smoker    Packs/day: 0.50    Years: 35.00    Types: Cigarettes  . Smokeless tobacco: Never Used  . Alcohol use No  . Drug use: No  . Sexual activity: Not on file   Other Topics Concern  . Not on file   Social History Narrative   She is a widowed mother of 74, grandmother for, great-grandmother 32.  She still smokes about half pack a day.  She doesn't have about 35 years.  She does not drink.   Prior to her symptoms coming on, she used to do all kind of walking around and working in the garden and other activities with her close friend.    Family History  Problem Relation Age of Onset  . Liver cancer Mother   . Heart attack Mother   . Heart Problems Sister      Review of Systems:  See HPI for pertinent ROS. All other ROS negative.    Physical Exam: Blood pressure 130/70, pulse 60, temperature 97.3 F (36.3 C), temperature source Oral, resp. rate 16, weight 174 lb (78.9 kg), SpO2 98 %., Body mass index is 32.88 kg/m. General:  WNWD WF. Appears in no acute distress. Head: Normocephalic, atraumatic, eyes without discharge, sclera non-icteric, nares are without discharge. Bilateral auditory canals clear, TM's are without perforation, pearly grey and translucent with reflective cone of light bilaterally. Oral cavity moist, posterior pharynx without exudate, erythema, peritonsillar abscess. No tenderness with percussion to frontal or maxillary sinuses bilaterally.  Neck: Supple. No thyromegaly. No lymphadenopathy. Lungs: Clear bilaterally to auscultation without wheezes, rales, or rhonchi. Breathing is unlabored. Heart: RRR with S1 S2. No murmurs, rubs, or gallops. Musculoskeletal:  Strength and tone normal for age. Extremities/Skin: Warm and dry. Neuro: Alert and  oriented X 3. Moves all extremities spontaneously. Gait is normal. CNII-XII grossly in tact. Psych:  Responds to questions appropriately. She does have flat affect.     ASSESSMENT AND PLAN:  71 y.o. year old female with  1. Bacterial respiratory infection Is to take antibiotic as directed. Follow-up if symptoms do not resolve within 1 week after completion of antibiotic. - azithromycin (ZITHROMAX) 250 MG tablet; Day 1: Take 2 daily. Days 2 - 5: Take 1 daily.  Dispense: 6 tablet; Refill: 0  2. Generalized anxiety disorder Reviewed her prior office note. At visit 09/19/14 -- increased Celexa from 20 mg to 40 mg. At f/u visit 12/2014 she reported seeing absolutely no improvement since increasing the Celexa to 40 mg.  12/2014 started Cymbalta.  Currently taking Cymbalta 60 mg. Also reviewed that in the past she had used Wellbutrin and that was not a good fit for her. At this time will have her wean off of the Cymbalta and I have written down how for her to do this. Then she is to then start Trintellix. Did give her a sample bottle of this told her to go ahead and check with the pharmacy regarding cost so that if this is going to be too expensive we can go ahead and be deciding on alternative treatment options. F/U OV visit 8 weeks or sooner if needed. - vortioxetine HBr (TRINTELLIX) 10 MG TABS; Take 1 tablet (10 mg total) by mouth daily.  Dispense: 30 tablet; Refill: 1  3. Depression, recurrent ([place]) Reviewed her prior office note. At visit 09/19/14 -- increased Celexa from 20 mg to 40 mg. At f/u visit 12/2014 she reported seeing absolutely no improvement since increasing the Celexa to 40 mg.  12/2014 started Cymbalta.  Currently taking Cymbalta 60 mg. Also reviewed that in the past she had used Wellbutrin and that was not a good fit for her. At this time will have her wean off of the Cymbalta and I have written down how for her to do this. Then she is to then start Trintellix. Did give her a sample  bottle of this told her to go ahead and check with the pharmacy regarding cost so that if this is going to be too expensive we can go ahead and be deciding on alternative treatment options. F/U OV visit 8 weeks or sooner if needed.  - vortioxetine HBr (TRINTELLIX) 10 MG TABS; Take 1 tablet (10 mg total) by mouth daily.  Dispense: 30 tablet; Refill: 1  SMOKER Discussed with her that we need to wait to discuss using Chantix or other treatment options for smoking cessation after we get the above anxiety/depression  F/U OV 8 weeks, sooner if needed.  [address] [place], [place], [facility] 10/08/2016 12:21 PM

## 2016-10-09 ENCOUNTER — Telehealth: Payer: Self-pay

## 2016-10-09 NOTE — Telephone Encounter (Signed)
Pt states she was given a sample of trintellix at her OV on 2-8 and states with ins covering the cost she still has to pay $75.00 out of pocket a month and pt states that is to much for her.  Pt states she was told if the cost was to much then alternatives would be discussed  Pls advise

## 2016-10-12 MED ORDER — VILAZODONE HCL 10 MG PO TABS: ORAL_TABLET | ORAL | 0 refills | Status: DC

## 2016-10-12 NOTE — Telephone Encounter (Signed)
Viibryd 10mg  Take one daily for 7 days  then increase to taking 2 daily for 7 days. Take with food.  Change follow-up office visit for 2 weeks instead of 8 weeks that I had said at the time of the last visit.

## 2016-10-12 NOTE — Telephone Encounter (Signed)
Once we get dose adjusted for Viibryd, will give her Rx to last full month so will only pay for it once a month--$35 a month. Cymbalta is not controlling her symptoms. Valium is not appropriate.

## 2016-10-12 NOTE — Telephone Encounter (Signed)
Rx sent to pharmacy lvm for pt  

## 2016-10-12 NOTE — Telephone Encounter (Addendum)
Pt states the Viibryd will cost her $35 every two weeks and is asking if she can either stay on cymbalta or try Valium?    Pls Advise

## 2016-10-13 ENCOUNTER — Other Ambulatory Visit: Payer: Self-pay | Admitting: Physician Assistant

## 2016-10-13 NOTE — Telephone Encounter (Signed)
Setraline 50mg  1 po QD # 30 + 1.  Tell pt to use this and have f/u OV 6 weeks.

## 2016-10-13 NOTE — Telephone Encounter (Signed)
Spoke with [place] at  [facility] regarding alternatives for [employer] as well as Viibryd.  These medications are all $7.00 monthly these are generic brands names are more   duloxetine  Sertraline paroxetine

## 2016-10-13 NOTE — Telephone Encounter (Signed)
Refill appropriate 

## 2016-10-14 ENCOUNTER — Other Ambulatory Visit: Payer: Self-pay | Admitting: Physician Assistant

## 2016-10-14 DIAGNOSIS — J988 Other specified respiratory disorders: Secondary | ICD-10-CM

## 2016-10-14 DIAGNOSIS — B9689 Other specified bacterial agents as the cause of diseases classified elsewhere: Principal | ICD-10-CM

## 2016-10-14 MED ORDER — SERTRALINE HCL 50 MG PO TABS: 50.0000 mg | ORAL_TABLET | Freq: Every day | ORAL | 1 refills | Status: DC

## 2016-10-14 NOTE — Addendum Note (Signed)
Addended by: Vonna Kotyk A on: 10/14/2016 11:02 AM   Modules accepted: Orders

## 2016-10-14 NOTE — Telephone Encounter (Signed)
Rx filled pt aware

## 2016-10-20 ENCOUNTER — Telehealth: Payer: Self-pay

## 2016-10-20 NOTE — Telephone Encounter (Signed)
Reviewed prior phone note.  Paroxetine is the other med that she get for $7 given her insurance plan.  Paroxetine 20mg  1 po QD # 30 + 1

## 2016-10-20 NOTE — Telephone Encounter (Signed)
Pt states she took the Sertraline and it caused her to lose her temper and feel really crazy.Pt also states the medicine makes her sleepy and is req to try lexapro.Pt states she would also be willing to try  Paroxetine as well . Pls advise

## 2016-10-21 ENCOUNTER — Encounter: Payer: Self-pay | Admitting: Physician Assistant

## 2016-10-21 ENCOUNTER — Ambulatory Visit (INDEPENDENT_AMBULATORY_CARE_PROVIDER_SITE_OTHER): Payer: Medicare Other | Admitting: Physician Assistant

## 2016-10-21 VITALS — BP 130/72 | HR 61 | Temp 98.5°F | Resp 16 | Wt 173.0 lb

## 2016-10-21 DIAGNOSIS — J988 Other specified respiratory disorders: Secondary | ICD-10-CM

## 2016-10-21 DIAGNOSIS — J0100 Acute maxillary sinusitis, unspecified: Secondary | ICD-10-CM

## 2016-10-21 DIAGNOSIS — B9689 Other specified bacterial agents as the cause of diseases classified elsewhere: Secondary | ICD-10-CM

## 2016-10-21 MED ORDER — PAROXETINE HCL 20 MG PO TABS: 20.0000 mg | ORAL_TABLET | Freq: Every day | ORAL | 1 refills | Status: DC

## 2016-10-21 MED ORDER — PREDNISONE 20 MG PO TABS: ORAL_TABLET | ORAL | 0 refills | Status: DC

## 2016-10-21 MED ORDER — LEVOFLOXACIN 750 MG PO TABS: 750.0000 mg | ORAL_TABLET | Freq: Every day | ORAL | 0 refills | Status: DC

## 2016-10-21 NOTE — Telephone Encounter (Signed)
Rx filled Lvm for pt

## 2016-10-21 NOTE — Progress Notes (Signed)
Patient ID: Alexa Orozco MRN: [id number], DOB: 05/27/1946, 71 y.o. Date of Encounter: 10/21/2016, 2:29 PM    Chief Complaint:  Chief Complaint  Patient presents with  . burning in chest  . Cough  . Headache  . pressure under eyes  . left and right ear pain     HPI: 71 y.o. year old female presents with above.   Says that she "never got completely well after the last visit."  Reviewed last office note 10/08/16. At that time she reported that she had had cough for about 3 weeks. Also had some drainage from her nose and postnasal drainage. Had had no fever, no sore throat.  At that visit prescribed azithromycin.  Today she states that she has taken the azithromycin. Was feeling some better but it never completely cleared up. Now she is having more mucus and congestion in her nose and feeling congestion/pressure around her eyes. Still in her chest also.       Home Meds:   Outpatient Medications Prior to Visit  Medication Sig Dispense Refill  . atorvastatin (LIPITOR) 40 MG tablet TAKE 1 TABLET BY MOUTH EVERY DAY 90 tablet 3  . benzonatate (TESSALON) 200 MG capsule TAKE ONE CAPSULE BY MOUTH 3 TIMES A DAY AS NEEDED FOR COUGH 30 capsule 1  . clopidogrel (PLAVIX) 75 MG tablet TAKE 1 TABLET BY MOUTH EVERY DAY NEEDS OFFICE VISIT 90 tablet 3  . cromolyn (OPTICROM) 4 % ophthalmic solution Place 1 drop into both eyes 4 (four) times daily.    Marland Kitchen estradiol (ESTRACE) 1 MG tablet TAKE 1 TABLET BY MOUTH DAILY 90 tablet 1  . Eszopiclone 3 MG TABS TAKE 1 TABLET BY MOUTH AT BEDTIME AS NEEDED 30 tablet 3  . levothyroxine (SYNTHROID, LEVOTHROID) 88 MCG tablet TAKE 1 TABLET BY MOUTH EVERY DAY 90 tablet 1  . meclizine (ANTIVERT) 25 MG tablet Take 1 tablet (25 mg total) by mouth 3 (three) times daily as needed for dizziness. 30 tablet 0  . nicotine (NICODERM CQ) 14 mg/24hr patch Place 1 patch (14 mg total) onto the skin daily. Use this path after completing the 21mg  /24h for 4 weeks 28 patch 0  . nicotine  (NICODERM CQ) 21 mg/24hr patch Place 1 patch (21 mg total) onto the skin daily. Use this patch 1st 28 patch 0  . nicotine (NICODERM CQ) 7 mg/24hr patch Place 1 patch (7 mg total) onto the skin daily. Use this patch after using 14 mg/24h for 4 weeks 28 patch 0  . nitroGLYCERIN (NITROSTAT) 0.4 MG SL tablet Place 1 tablet (0.4 mg total) under the tongue every 5 (five) minutes as needed for chest pain. 25 tablet 6  . pantoprazole (PROTONIX) 40 MG tablet TAKE 1 TABLET BY MOUTH EVERY DAY 90 tablet 1  . PARoxetine (PAXIL) 20 MG tablet Take 1 tablet (20 mg total) by mouth daily. 30 tablet 1  . PROAIR HFA 108 (90 Base) MCG/ACT inhaler INHALE 2 PUFFS INTO THE LUNGS EVERY 4 (FOUR) HOURS AS NEEDED FOR WHEEZING OR SHORTNESS OF BREATH. 8.5 Inhaler 3  . SPIRIVA HANDIHALER 18 MCG inhalation capsule PLACE 1 CAPSULE (18 MCG TOTAL) INTO INHALER AND INHALE DAILY. 30 capsule 4  . Vilazodone HCl (VIIBRYD) 10 MG TABS Take one daily for 7 days, then increase to taking 2 daily for 7 days, Take with food 21 tablet 0  . azithromycin (ZITHROMAX) 250 MG tablet Day 1: Take 2 daily. Days 2 - 5: Take 1 daily. (Patient not taking: Reported  on 10/21/2016) 6 tablet 0  . predniSONE (DELTASONE) 20 MG tablet 3 tabs po qd x 2 days, 2 tabs po qd x 2 days, 1 tab po qd x 2 days (Patient not taking: Reported on 10/21/2016) 12 tablet 0   No facility-administered medications prior to visit.     Allergies:  Allergies  Allergen Reactions  . Bystolic [Nebivolol Hcl] Hives  . Cephalexin Hives      Review of Systems: See HPI for pertinent ROS. All other ROS negative.    Physical Exam: Blood pressure 130/72, pulse 61, temperature 98.5 F (36.9 C), temperature source Oral, resp. rate 16, weight 173 lb (78.5 kg), SpO2 98 %., Body mass index is 32.69 kg/m. General:  WNWD WF. Appears in no acute distress but does sound congested and is coughing throughout visit. HEENT: Normocephalic, atraumatic, eyes without discharge, sclera non-icteric,  nares are without discharge. Bilateral auditory canals clear, TM's are without perforation, pearly grey and translucent with reflective cone of light bilaterally. Oral cavity moist, posterior pharynx without exudate, erythema, peritonsillar abscess. Positive tenderness with percussion to maxillary sinuses bilaterally.   Neck: Supple. No thyromegaly. No lymphadenopathy. Lungs: Clear bilaterally to auscultation without wheezes, rales, or rhonchi. Breathing is unlabored. Heart: Regular rhythm. No murmurs, rubs, or gallops. Msk:  Strength and tone normal for age. Extremities/Skin: Warm and dry.  Neuro: Alert and oriented X 3. Moves all extremities spontaneously. Gait is normal. CNII-XII grossly in tact. Psych:  Responds to questions appropriately with a normal affect.     ASSESSMENT AND PLAN:  71 y.o. year old female with  1. Acute maxillary sinusitis, recurrence not specified She is to take the Levaquin and the prednisone taper as directed. Follow-up if symptoms worsen or if symptoms do not resolve upon completion of these. - levofloxacin (LEVAQUIN) 750 MG tablet; Take 1 tablet (750 mg total) by mouth daily.  Dispense: 7 tablet; Refill: 0 - predniSONE (DELTASONE) 20 MG tablet; Take 3 daily for 2 days, then 2 daily for 2 days, then 1 daily for 2 days.  Dispense: 12 tablet; Refill: 0  2. Bacterial respiratory infection  - levofloxacin (LEVAQUIN) 750 MG tablet; Take 1 tablet (750 mg total) by mouth daily.  Dispense: 7 tablet; Refill: 0   Signed, [address] [place], [place], [facility] 10/21/2016 2:30 PM

## 2016-10-23 ENCOUNTER — Telehealth: Payer: Self-pay

## 2016-10-23 NOTE — Telephone Encounter (Signed)
Pt called and left LVM Pt states she is having an allergic reaction to her medication but is not sure which medication it is, pt left a messaging stating her face had turned red and that she had stopped taking her medication and took benadryl. Called pt and left mess for her to call me back

## 2016-10-26 ENCOUNTER — Ambulatory Visit: Payer: Medicare Other | Admitting: Physician Assistant

## 2016-10-26 MED ORDER — DOXYCYCLINE HYCLATE 100 MG PO TABS: 100.0000 mg | ORAL_TABLET | Freq: Two times a day (BID) | ORAL | 0 refills | Status: DC

## 2016-10-26 NOTE — Telephone Encounter (Signed)
Add Levaquin to allergy list. Send prescription for doxycycline 100 mg 1 by mouth twice a day 10 days #20+0.

## 2016-10-26 NOTE — Telephone Encounter (Signed)
Rx sent to pharmacy/ Levaquin added to allergy list

## 2016-10-26 NOTE — Telephone Encounter (Signed)
Spoke with pt and she states the antibiotic levofloxacin caused her face to turn red and pt states she got really hot. Pt stopped taking antibiotic on 2-23 and need another rx called in   Pls advise

## 2016-11-25 ENCOUNTER — Ambulatory Visit: Payer: Self-pay | Admitting: Physician Assistant

## 2016-11-25 ENCOUNTER — Other Ambulatory Visit: Payer: Self-pay | Admitting: Physician Assistant

## 2016-11-25 DIAGNOSIS — Z1231 Encounter for screening mammogram for malignant neoplasm of breast: Secondary | ICD-10-CM

## 2016-11-26 ENCOUNTER — Ambulatory Visit
Admission: RE | Admit: 2016-11-26 | Discharge: 2016-11-26 | Disposition: A | Discharge status: Home / Self Care | Payer: Medicare Other | Source: Ambulatory Visit | Attending: Physician Assistant | Admitting: Physician Assistant

## 2016-11-26 ENCOUNTER — Other Ambulatory Visit: Payer: Self-pay | Admitting: Physician Assistant

## 2016-11-26 DIAGNOSIS — Z1231 Encounter for screening mammogram for malignant neoplasm of breast: Secondary | ICD-10-CM

## 2016-11-26 NOTE — Telephone Encounter (Signed)
Ok to refill 

## 2016-11-28 ENCOUNTER — Other Ambulatory Visit: Payer: Self-pay | Admitting: Physician Assistant

## 2016-11-30 NOTE — Telephone Encounter (Signed)
Approved. #[id number]. 

## 2016-11-30 NOTE — Telephone Encounter (Signed)
Rx phoned in.   

## 2016-11-30 NOTE — Telephone Encounter (Signed)
Refill appropriate 

## 2016-12-03 ENCOUNTER — Encounter: Payer: Self-pay | Admitting: Physician Assistant

## 2016-12-03 ENCOUNTER — Ambulatory Visit (INDEPENDENT_AMBULATORY_CARE_PROVIDER_SITE_OTHER): Payer: Medicare Other | Admitting: Physician Assistant

## 2016-12-03 VITALS — BP 130/80 | HR 67 | Temp 97.8°F | Resp 14 | Wt 173.8 lb

## 2016-12-03 DIAGNOSIS — F411 Generalized anxiety disorder: Secondary | ICD-10-CM

## 2016-12-03 DIAGNOSIS — J439 Emphysema, unspecified: Secondary | ICD-10-CM

## 2016-12-03 MED ORDER — PAROXETINE HCL 40 MG PO TABS: 40.0000 mg | ORAL_TABLET | ORAL | 1 refills | Status: DC

## 2016-12-03 MED ORDER — BUDESONIDE-FORMOTEROL FUMARATE 160-4.5 MCG/ACT IN AERO: 2.0000 | INHALATION_SPRAY | Freq: Two times a day (BID) | RESPIRATORY_TRACT | 3 refills | Status: DC

## 2016-12-03 NOTE — Progress Notes (Signed)
Patient ID: Alexa Orozco MRN: [id number], DOB: [birth year], 71 y.o. Date of Encounter: @DATE @  Chief Complaint:  Chief Complaint  Patient presents with  . Anxiety    HPI: 71 y.o. year old female  presents for f/u OV regarding Anxiety.   OV 69/8/18 she reported that she had been feeling anxiety recently. At that visit she reported that she had not been wanting to leave her house, had not been wanting to do anything. Felt like she had no laughter anymore. She would catch herself holding one hand with the other, feeling like something was about to happen. Asked if anything was going on in her life to be triggering this or whether things in her life have been stable. She said she had some problems with 2 of her children and that was causing her increased stress.  At that visit I reviewed that she had been on the following meds and they had not worked well for her: At visit 09/19/14 increased Celexa from 20 mg to 40 mg. Follow-up visit 12/2014 reported no improvement since increasing the Celexa to 40 mg. 5/16 started Cymbalta. Visit with me 10/08/16 was still taking Cymbalta 60 mg. Also reviewed that in the past she had used Wellbutrin and that had not been a good fit for her.  At visit 10/08/16 I prescribed Trintillix Got a phone note that that was going to cost $75 and was too expensive. I was going to then change to Viibryd. That was also going to be too expensive. Staff then talked to [facility] and found that the following medicines will be $7 monthly:  Duloxetine, sertraline,paroxetine February 13 -- said to prescribe sertraline 50 mg. Another phone note on February 20 stating that she took the sertraline and it caused her to lose her temper and feel really crazy. [name] also made her feel sleepy. At that time we then changed her to paroxetine 20 mg.  Today---she reports that she is 71 y.o. taking the Paxil 20mg  daily. It is not causing any adverse effects. Thinks it is helping some, but thinks she  definitely needs higher dose.   Today---She also reports that she feels she needs more med for her COPD. Does not actually use albuterol frequently but does feel like she needs it frequently. Feels some shortness of breath.  Past Medical History:  Diagnosis Date  . Allergy   . Asthma   . CAD S/P percutaneous coronary angioplasty February 2007   Class III Angina --> Cath: 85% -- PCI 3.22mm x 4mm (4.0 mm) Cypher DES to RCA; Myoview October 2014: normal stress test: LOW RISK; mild anteroapical breast attenuation  . Cataract    h/o bilat repair  . Depression   . Depression 11/29/2013  . Dyslipidemia   . Emphysema of lung ([place])   . GERD (gastroesophageal reflux disease)   . History of palpitations   . History of tobacco abuse   . Hypertension    well-controlled  . Hypothyroidism   . Osteoporosis   . Raynaud disease      Home Meds: Outpatient Medications Prior to Visit  Medication Sig Dispense Refill  . atorvastatin (LIPITOR) 40 MG tablet TAKE 1 TABLET BY MOUTH EVERY DAY 90 tablet 3  . clopidogrel (PLAVIX) 75 MG tablet TAKE 1 TABLET BY MOUTH EVERY DAY NEEDS OFFICE VISIT 90 tablet 3  . cromolyn (OPTICROM) 4 % ophthalmic solution Place 1 drop into both eyes 4 (four) times daily.    Marland Kitchen doxycycline (VIBRA-TABS) 100 MG tablet Take 1  tablet (100 mg total) by mouth 2 (two) times daily. 20 tablet 0  . estradiol (ESTRACE) 1 MG tablet TAKE 1 TABLET BY MOUTH DAILY 90 tablet 1  . Eszopiclone 3 MG TABS TAKE 1 TABLET BY MOUTH AT BEDTIME AS NEEDED 30 tablet 3  . levothyroxine (SYNTHROID, LEVOTHROID) 88 MCG tablet TAKE 1 TABLET BY MOUTH EVERY DAY 90 tablet 0  . meclizine (ANTIVERT) 25 MG tablet Take 1 tablet (25 mg total) by mouth 3 (three) times daily as needed for dizziness. 30 tablet 0  . nicotine (NICODERM CQ) 14 mg/24hr patch Place 1 patch (14 mg total) onto the skin daily. Use this path after completing the 21mg  /24h for 4 weeks 28 patch 0  . nicotine (NICODERM CQ) 21 mg/24hr patch Place 1 patch  (21 mg total) onto the skin daily. Use this patch 1st 28 patch 0  . nicotine (NICODERM CQ) 7 mg/24hr patch Place 1 patch (7 mg total) onto the skin daily. Use this patch after using 14 mg/24h for 4 weeks 28 patch 0  . nitroGLYCERIN (NITROSTAT) 0.4 MG SL tablet Place 1 tablet (0.4 mg total) under the tongue every 5 (five) minutes as needed for chest pain. 25 tablet 6  . pantoprazole (PROTONIX) 40 MG tablet TAKE 1 TABLET BY MOUTH EVERY DAY 90 tablet 1  . PARoxetine (PAXIL) 20 MG tablet Take 1 tablet (20 mg total) by mouth daily. 30 tablet 1  . PROAIR HFA 108 (90 Base) MCG/ACT inhaler INHALE 2 PUFFS INTO THE LUNGS EVERY 4 (FOUR) HOURS AS NEEDED FOR WHEEZING OR SHORTNESS OF BREATH. 8.5 Inhaler 3  . SPIRIVA HANDIHALER 18 MCG inhalation capsule PLACE 1 CAPSULE (18 MCG TOTAL) INTO INHALER AND INHALE DAILY. 30 capsule 4  . Vilazodone HCl (VIIBRYD) 10 MG TABS Take one daily for 7 days, then increase to taking 2 daily for 7 days, Take with food 21 tablet 0  . azithromycin (ZITHROMAX) 250 MG tablet Day 1: Take 2 daily. Days 2 - 5: Take 1 daily. (Patient not taking: Reported on 10/21/2016) 6 tablet 0  . benzonatate (TESSALON) 200 MG capsule TAKE ONE CAPSULE BY MOUTH 3 TIMES A DAY AS NEEDED FOR COUGH 30 capsule 1  . predniSONE (DELTASONE) 20 MG tablet 3 tabs po qd x 2 days, 2 tabs po qd x 2 days, 1 tab po qd x 2 days (Patient not taking: Reported on 10/21/2016) 12 tablet 0  . predniSONE (DELTASONE) 20 MG tablet Take 3 daily for 2 days, then 2 daily for 2 days, then 1 daily for 2 days. 12 tablet 0   No facility-administered medications prior to visit.     Allergies:  Allergies  Allergen Reactions  . Bystolic [Nebivolol Hcl] Hives  . Cephalexin Hives  . Levaquin [Levofloxacin In D5w] Other (See Comments)    Caused face to turn red and patient became extremely hot     Social History   Social History  . Marital status: Widowed    Spouse name: N/A  . Number of children: 3  . Years of education: 10th     Occupational History  . Retired    Social History Main Topics  . Smoking status: Current Every Day Smoker    Packs/day: 0.50    Years: 35.00    Types: Cigarettes  . Smokeless tobacco: Never Used  . Alcohol use No  . Drug use: No  . Sexual activity: Not on file   Other Topics Concern  . Not on file   Social  History Narrative   She is a widowed mother of 71, grandmother for, great-grandmother 9.  She still smokes about half pack a day.  She doesn't have about 35 years.  She does not drink.   Prior to her symptoms coming on, she used to do all kind of walking around and working in the garden and other activities with her close friend.    Family History  Problem Relation Age of Onset  . Liver cancer Mother   . Heart attack Mother   . Heart Problems Sister   . Breast cancer Neg Hx      Review of Systems:  See HPI for pertinent ROS. All other ROS negative.    Physical Exam: Blood pressure 130/80, pulse 67, temperature 97.8 F (36.6 C), temperature source Oral, resp. rate 14, weight 173 lb 12.8 oz (78.8 kg), SpO2 98 %., There is no height or weight on file to calculate BMI. General: Appears in no acute distress. Neck: Supple. No thyromegaly. No lymphadenopathy. Lungs: Distant breath sounds but clear Heart: RRR with S1 S2. No murmurs, rubs, or gallops. Musculoskeletal:  Strength and tone normal for age. Extremities/Skin: Warm and dry.  Neuro: Alert and oriented X 3. Moves all extremities spontaneously. Gait is normal. CNII-XII grossly in tact. Psych:  Responds to questions appropriately with a normal affect.     ASSESSMENT AND PLAN:  71 y.o. year old female with  1. Generalized anxiety disorder Increase Paxil from 20mg  to 40mg .  F/U OV 2 months, sooner if needed.     2. Pulmonary emphysema, unspecified emphysema type ([place]) Cont Spiriva. Add Symbicort. Cont Albuterol prn. - budesonide-formoterol (SYMBICORT) 160-4.5 MCG/ACT inhaler; Inhale 2 puffs into the lungs 2  (two) times daily.  Dispense: 1 Inhaler; Refill: 3   Signed, [address] [place], [place], [facility] 12/03/2016 2:10 PM

## 2016-12-08 ENCOUNTER — Telehealth: Payer: Self-pay

## 2016-12-08 NOTE — Telephone Encounter (Signed)
Patient called and stated she wanted to let  you know she was not able to pick up the symbicort inhaler prescribed to her on 12-03-2016 due to cost.   I explained to the patient it would be best for her to  to contact her insurance company to see what alternative they will cover that is affordable to her and give our office a call back and we can change the prescription to what they will cover.   Patient stated she would call her insurance company.

## 2017-01-15 ENCOUNTER — Other Ambulatory Visit: Payer: Self-pay | Admitting: Physician Assistant

## 2017-01-20 ENCOUNTER — Other Ambulatory Visit: Payer: Self-pay | Admitting: Physician Assistant

## 2017-01-20 NOTE — Telephone Encounter (Signed)
Rx called in 

## 2017-01-20 NOTE — Telephone Encounter (Signed)
Approved. #[id number]. 

## 2017-01-20 NOTE — Telephone Encounter (Signed)
Last OV 4/5/218 Last refill 11/30/2016 Ok to refill?

## 2017-01-26 ENCOUNTER — Other Ambulatory Visit: Payer: Self-pay | Admitting: Physician Assistant

## 2017-01-26 DIAGNOSIS — F411 Generalized anxiety disorder: Secondary | ICD-10-CM

## 2017-02-03 ENCOUNTER — Other Ambulatory Visit: Payer: Self-pay

## 2017-02-03 DIAGNOSIS — F411 Generalized anxiety disorder: Secondary | ICD-10-CM

## 2017-02-03 MED ORDER — PAROXETINE HCL 40 MG PO TABS: 40.0000 mg | ORAL_TABLET | ORAL | 1 refills | Status: DC

## 2017-02-03 NOTE — Telephone Encounter (Signed)
Refill appropriate 

## 2017-02-08 ENCOUNTER — Ambulatory Visit (INDEPENDENT_AMBULATORY_CARE_PROVIDER_SITE_OTHER): Payer: Medicare Other | Admitting: Physician Assistant

## 2017-02-08 ENCOUNTER — Encounter: Payer: Self-pay | Admitting: Physician Assistant

## 2017-02-08 VITALS — BP 114/70 | HR 63 | Temp 97.6°F | Resp 16 | Wt 168.8 lb

## 2017-02-08 DIAGNOSIS — L239 Allergic contact dermatitis, unspecified cause: Secondary | ICD-10-CM

## 2017-02-08 DIAGNOSIS — F411 Generalized anxiety disorder: Secondary | ICD-10-CM | POA: Diagnosis not present

## 2017-02-08 MED ORDER — PREDNISONE 20 MG PO TABS: ORAL_TABLET | ORAL | 0 refills | Status: DC
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
## 2017-02-11 ENCOUNTER — Other Ambulatory Visit: Payer: Self-pay | Admitting: Physician Assistant

## 2017-02-11 NOTE — Telephone Encounter (Signed)
Refill appropriate 

## 2017-02-15 ENCOUNTER — Other Ambulatory Visit: Payer: Self-pay | Admitting: Physician Assistant

## 2017-02-15 NOTE — Telephone Encounter (Signed)
Refill appropriate 

## 2017-02-27 ENCOUNTER — Other Ambulatory Visit: Payer: Self-pay | Admitting: Physician Assistant

## 2017-03-01 NOTE — Telephone Encounter (Signed)
Refill appropriate 

## 2017-03-08 ENCOUNTER — Ambulatory Visit (INDEPENDENT_AMBULATORY_CARE_PROVIDER_SITE_OTHER): Payer: Medicare Other | Admitting: Physician Assistant

## 2017-03-08 ENCOUNTER — Encounter: Payer: Self-pay | Admitting: Physician Assistant

## 2017-03-08 VITALS — BP 130/80 | HR 65 | Temp 97.6°F | Resp 16 | Ht 61.0 in | Wt 169.8 lb

## 2017-03-08 DIAGNOSIS — I10 Essential (primary) hypertension: Secondary | ICD-10-CM | POA: Diagnosis not present

## 2017-03-08 DIAGNOSIS — Z9861 Coronary angioplasty status: Secondary | ICD-10-CM

## 2017-03-08 DIAGNOSIS — F411 Generalized anxiety disorder: Secondary | ICD-10-CM | POA: Diagnosis not present

## 2017-03-08 DIAGNOSIS — E039 Hypothyroidism, unspecified: Secondary | ICD-10-CM

## 2017-03-08 DIAGNOSIS — R5383 Other fatigue: Secondary | ICD-10-CM | POA: Diagnosis not present

## 2017-03-08 DIAGNOSIS — E785 Hyperlipidemia, unspecified: Secondary | ICD-10-CM

## 2017-03-08 DIAGNOSIS — I2510 Atherosclerotic heart disease of native coronary artery without angina pectoris: Secondary | ICD-10-CM

## 2017-03-08 DIAGNOSIS — F330 Major depressive disorder, recurrent, mild: Secondary | ICD-10-CM
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
## 2017-03-09 ENCOUNTER — Other Ambulatory Visit: Payer: Medicare Other

## 2017-03-09 DIAGNOSIS — R5383 Other fatigue: Secondary | ICD-10-CM | POA: Diagnosis not present

## 2017-03-09 DIAGNOSIS — E785 Hyperlipidemia, unspecified: Secondary | ICD-10-CM

## 2017-03-09 DIAGNOSIS — E039 Hypothyroidism, unspecified: Secondary | ICD-10-CM

## 2017-03-09 DIAGNOSIS — I10 Essential (primary) hypertension: Secondary | ICD-10-CM | POA: Diagnosis not present

## 2017-03-09 LAB — CBC WITH DIFFERENTIAL/PLATELET
BASOS ABS: 62 {cells}/uL (ref 0–200)
Basophils Relative: 1 %
EOS PCT: 4 %
Eosinophils Absolute: 248 cells/uL (ref 15–500)
HCT: 40.6 % (ref 35.0–45.0)
HEMOGLOBIN: 13.5 g/dL (ref 12.0–15.0)
LYMPHS ABS: 2604 {cells}/uL (ref 850–3900)
LYMPHS PCT: 42 %
MCH: 32.1 pg (ref 27.0–33.0)
MCHC: 33.3 g/dL (ref 32.0–36.0)
MCV: 96.7 fL (ref 80.0–100.0)
MONOS PCT: 8 %
MPV: 9.8 fL (ref 7.5–12.5)
Monocytes Absolute: 496 cells/uL (ref 200–950)
NEUTROS PCT: 45 %
Neutro Abs: 2790 cells/uL (ref 1500–7800)
Platelets: 316 10*3/uL (ref 140–400)
RBC: 4.2 MIL/uL (ref 3.80–5.10)
RDW: 13.9 % (ref 11.0–15.0)
WBC: 6.2 10*3/uL (ref 3.8–10.8)

## 2017-03-10 LAB — COMPLETE METABOLIC PANEL WITH GFR
ALBUMIN: 3.8 g/dL (ref 3.6–5.1)
ALK PHOS: 91 U/L (ref 33–130)
ALT: 14 U/L (ref 6–29)
AST: 17 U/L (ref 10–35)
BUN: 9 mg/dL (ref 7–25)
CALCIUM: 9.1 mg/dL (ref 8.6–10.4)
CHLORIDE: 104 mmol/L (ref 98–110)
CO2: 24 mmol/L (ref 20–31)
Creat: 0.93 mg/dL (ref 0.50–0.99)
GFR, EST NON AFRICAN AMERICAN: 63 mL/min (ref >=60)
GFR, Est African American: 73 mL/min (ref >=60)
GLUCOSE: 95 mg/dL (ref 70–99)
POTASSIUM: 4.8 mmol/L (ref 3.5–5.3)
SODIUM: 137 mmol/L (ref 135–146)
Total Bilirubin: 0.4 mg/dL (ref 0.2–1.2)
Total Protein: 6.4 g/dL (ref 6.1–8.1)

## 2017-03-10 LAB — LIPID PANEL
CHOL/HDL RATIO: 2.4 ratio (ref <5.0)
Cholesterol: 156 mg/dL (ref <200)
HDL: 64 mg/dL (ref >50)
LDL Cholesterol: 74 mg/dL (ref <100)
TRIGLYCERIDES: 89 mg/dL (ref <150)
VLDL: 18 mg/dL (ref <30)

## 2017-03-10 LAB — TSH: TSH: 1.17 m[IU]/L

## 2017-03-13 ENCOUNTER — Other Ambulatory Visit: Payer: Self-pay | Admitting: Cardiovascular Disease

## 2017-03-17 ENCOUNTER — Other Ambulatory Visit: Payer: Self-pay | Admitting: Physician Assistant

## 2017-03-17 DIAGNOSIS — L239 Allergic contact dermatitis, unspecified cause: Secondary | ICD-10-CM

## 2017-03-22 ENCOUNTER — Ambulatory Visit (HOSPITAL_COMMUNITY)
Admission: EM | Admit: 2017-03-22 | Discharge: 2017-03-22 | Disposition: A | Discharge status: Home / Self Care | Payer: Medicare Other | Attending: Internal Medicine | Admitting: Internal Medicine

## 2017-03-22 ENCOUNTER — Encounter (HOSPITAL_COMMUNITY): Payer: Self-pay | Admitting: Emergency Medicine

## 2017-03-22 DIAGNOSIS — H9201 Otalgia, right ear: Secondary | ICD-10-CM | POA: Diagnosis not present

## 2017-03-22 DIAGNOSIS — H6501 Acute serous otitis media, right ear: Secondary | ICD-10-CM | POA: Diagnosis not present

## 2017-03-22 MED ORDER — AMOXICILLIN 500 MG PO CAPS: 1000.0000 mg | ORAL_CAPSULE | Freq: Two times a day (BID) | ORAL | 0 refills | Status: DC

## 2017-03-22 NOTE — ED Provider Notes (Signed)
CSN: [id number]     Arrival date & time 03/22/17  1009 History   First MD Initiated Contact with Patient 03/22/17 1105     Chief Complaint  Patient presents with  . Otalgia   (Consider location/radiation/quality/duration/timing/severity/associated sxs/prior Treatment) 71 year old female complaining of right earache for 3 days. She has been trying to put medicine in the year and clean it out. She feels pulsing in the ear with itching and burning. Denies change in hearing. No complaints of the left ear, no upper respiratory symptoms, fever or chills.      Past Medical History:  Diagnosis Date  . Allergy   . Asthma   . CAD S/P percutaneous coronary angioplasty February 2007   Class III Angina --> Cath: 85% -- PCI 3.57mm x 64mm (4.0 mm) Cypher DES to RCA; Myoview October 2014: normal stress test: LOW RISK; mild anteroapical breast attenuation  . Cataract    h/o bilat repair  . Depression   . Depression 11/29/2013  . Dyslipidemia   . Emphysema of lung ([place])   . GERD (gastroesophageal reflux disease)   . History of palpitations   . History of tobacco abuse   . Hypertension    well-controlled  . Hypothyroidism   . Osteoporosis   . Raynaud disease    Past Surgical History:  Procedure Laterality Date  . ABDOMINAL HYSTERECTOMY     h/o partila hysterectomy  . BREAST BIOPSY  1988  . CORONARY ANGIOPLASTY WITH STENT PLACEMENT  10/14/2005   cypher DES (3.25mmx18mm) to high grade RCA lesion  . NM MYOVIEW LTD  July 2012 of October 2014   '12: dipyridamole; Normal, low risk study; 2014 - normal stress test: LOW RISK; mild anteroapical breast attenuation  . TONSILLECTOMY  1959  . TRANSTHORACIC ECHOCARDIOGRAM  05/22/2009   EF=>55%, normal LV systolic function; normal RV systolic function; mild MR/TR   Family History  Problem Relation Age of Onset  . Liver cancer Mother   . Heart attack Mother   . Heart Problems Sister   . Breast cancer Neg Hx    Social History  Substance Use Topics  .  Smoking status: Current Every Day Smoker    Packs/day: 0.50    Years: 35.00    Types: Cigarettes  . Smokeless tobacco: Never Used  . Alcohol use No   OB History    No data available     Review of Systems  Constitutional: Negative.   HENT: Positive for ear pain. Negative for ear discharge, facial swelling, rhinorrhea and sinus pain.   Eyes: Negative.   Respiratory: Negative.   Neurological: Negative.   All other systems reviewed and are negative.   Allergies  Bystolic [nebivolol hcl]; Cephalexin; and Levaquin [levofloxacin in d5w]  Home Medications   Prior to Admission medications   Medication Sig Start Date End Date Taking? Authorizing Provider  atorvastatin (LIPITOR) 40 MG tablet TAKE 1 TABLET BY MOUTH EVERY DAY 05/25/16  Yes Leonie Man, MD  clopidogrel (PLAVIX) 75 MG tablet TAKE 1 TABLET BY MOUTH EVERY DAY 03/15/17  Yes Leonie Man, MD  cromolyn (OPTICROM) 4 % ophthalmic solution Place 1 drop into both eyes 4 (four) times daily. 06/16/16  Yes [provider]  estradiol (ESTRACE) 1 MG tablet TAKE 1 TABLET BY MOUTH DAILY 02/15/17  Yes Dena Billet B, PA-C  Eszopiclone 3 MG TABS TAKE 1 TABLET BY MOUTH AT BEDTIME AS NEEDED 01/20/17  Yes Orlena Sheldon, PA-C  levothyroxine (SYNTHROID, LEVOTHROID) 88 MCG tablet  TAKE 1 TABLET BY MOUTH EVERY DAY 03/01/17  Yes Dena Billet B, PA-C  pantoprazole (PROTONIX) 40 MG tablet TAKE 1 TABLET BY MOUTH EVERY DAY 03/01/17  Yes Dena Billet B, PA-C  PARoxetine (PAXIL) 40 MG tablet Take 1 tablet (40 mg total) by mouth every morning. 02/03/17  Yes Dixon, Mary B, PA-C  PROAIR HFA 108 [phone] Base) MCG/ACT inhaler INHALE 2 PUFFS INTO THE LUNGS EVERY 4 (FOUR) HOURS AS NEEDED FOR WHEEZING OR SHORTNESS OF BREATH. 01/15/17  Yes Dixon, Mary B, PA-C  SPIRIVA HANDIHALER 18 MCG inhalation capsule PLACE 1 CAPSULE (18 MCG TOTAL) INTO INHALER AND INHALE DAILY. 09/01/16  Yes Dena Billet B, PA-C  amoxicillin (AMOXIL) 500 MG capsule Take 2 capsules (1,000 mg total) by  mouth 2 (two) times daily. 03/22/17   Janne Napoleon, NP  benzonatate (TESSALON) 200 MG capsule TAKE ONE CAPSULE BY MOUTH 3 TIMES A DAY AS NEEDED FOR COUGH 02/11/17   Dixon, Lonie Peak, PA-C  meclizine (ANTIVERT) 25 MG tablet Take 1 tablet (25 mg total) by mouth 3 (three) times daily as needed for dizziness. 06/22/16   Horton, Barbette Hair, MD  nitroGLYCERIN (NITROSTAT) 0.4 MG SL tablet Place 1 tablet (0.4 mg total) under the tongue every 5 (five) minutes as needed for chest pain. 05/31/13   Leonie Man, MD   Meds Ordered and Administered this Visit  Medications - No data to display  BP (!) 169/81 (BP Location: Left Arm)   Pulse (!) 53   Temp 98.2 F (36.8 C) (Oral)   Resp 18   SpO2 99%  No data found.   Physical Exam  Constitutional: She is oriented to person, place, and time. She appears well-developed and well-nourished. No distress.  HENT:  Mouth/Throat: No oropharyngeal exudate.  Left TM partially obscured by cerumen. Right EAC is clear. The TM is dull and partially erythematous. Injected with some bulging of fluid and dependent positions.  Neck: Normal range of motion. Neck supple.  Cardiovascular: Normal rate.   Pulmonary/Chest: Effort normal.  Lymphadenopathy:    She has no cervical adenopathy.  Neurological: She is alert and oriented to person, place, and time.  Skin: Skin is warm.  Nursing note and vitals reviewed.   Urgent Care Course     Procedures (including critical care time)  Labs Review Labs Reviewed - No data to display  Imaging Review No results found.   Visual Acuity Review  Right Eye Distance:   Left Eye Distance:   Bilateral Distance:    Right Eye Near:   Left Eye Near:    Bilateral Near:         MDM   1. Right ear pain   2. Right acute serous otitis media, recurrence not specified    Do not put anything into the right ear. No medicines or oils. Take the antibiotic as directed. If you start to develop a rash or itching or swelling taking  the amoxicillin stop it and call the urgent care back. It may take the fluid several weeks to clear out of the ear. Follow-up with your doctor as needed. Meds ordered this encounter  Medications  . amoxicillin (AMOXIL) 500 MG capsule    Sig: Take 2 capsules (1,000 mg total) by mouth 2 (two) times daily.    Dispense:  40 capsule    Refill:  0    Order Specific Question:   Supervising Provider    Answer:   Jacklynn Ganong [[id number]]       Dakai Braithwaite,  Shanon Brow, NP 03/22/17 [phone]

## 2017-03-22 NOTE — ED Triage Notes (Signed)
Pt c/o right ear pain onset 3-4 days associated w/itching and burning  Denies fevers,  Cold sx  A&O x4... NAD... Ambulatory

## 2017-03-22 NOTE — Discharge Instructions (Signed)
Do not put anything into the right ear. No medicines or oils. Take the antibiotic as directed. If you start to develop a rash or itching or swelling taking the amoxicillin stop it and call the urgent care back. It may take the fluid several weeks to clear out of the ear. Follow-up with your doctor as needed.

## 2017-03-23 ENCOUNTER — Ambulatory Visit: Payer: Medicare Other | Admitting: Family Medicine

## 2017-03-29 ENCOUNTER — Encounter: Payer: Self-pay | Admitting: Physician Assistant

## 2017-04-01 ENCOUNTER — Encounter: Payer: Self-pay | Admitting: Cardiology

## 2017-04-01 ENCOUNTER — Ambulatory Visit (INDEPENDENT_AMBULATORY_CARE_PROVIDER_SITE_OTHER): Payer: Medicare Other | Admitting: Cardiology

## 2017-04-01 VITALS — BP 144/72 | HR 56 | Ht 61.0 in | Wt 167.0 lb

## 2017-04-01 DIAGNOSIS — E669 Obesity, unspecified: Secondary | ICD-10-CM

## 2017-04-01 DIAGNOSIS — I739 Peripheral vascular disease, unspecified: Secondary | ICD-10-CM | POA: Diagnosis not present

## 2017-04-01 DIAGNOSIS — I208 Other forms of angina pectoris: Secondary | ICD-10-CM

## 2017-04-01 DIAGNOSIS — E785 Hyperlipidemia, unspecified: Secondary | ICD-10-CM

## 2017-04-01 DIAGNOSIS — I209 Angina pectoris, unspecified: Secondary | ICD-10-CM | POA: Diagnosis not present

## 2017-04-01 DIAGNOSIS — I10 Essential (primary) hypertension: Secondary | ICD-10-CM | POA: Diagnosis not present

## 2017-04-01 DIAGNOSIS — Z9861 Coronary angioplasty status: Secondary | ICD-10-CM

## 2017-04-01 DIAGNOSIS — I2510 Atherosclerotic heart disease of native coronary artery without angina pectoris: Secondary | ICD-10-CM | POA: Diagnosis not present

## 2017-04-01 DIAGNOSIS — Z720 Tobacco use: Secondary | ICD-10-CM | POA: Diagnosis not present

## 2017-04-01 NOTE — Progress Notes (Signed)
PCP: Orlena Sheldon, PA-C  Clinic Note: Chief Complaint  Patient presents with  . Follow-up  . Coronary Artery Disease    HPI: Alexa Orozco is a 71 y.o. female with a PMH below who presents today for annual follow-up for CAD PCI for class III angina back in February 2007. Last Myoview stress test was in October 2014  Alexa Orozco was last seen in July 2017. Doing relatively well at that time.  Recent Hospitalizations: ER visit back in October 2017 for vertigo PCP evaluating "feeling tired a lot"  Studies Personally Reviewed - (if available, images/films reviewed: From Epic Chart or Care Everywhere)  None  Interval History: Alexa Orozco presents today overall doing fairly well with no active cardiac symptoms. She still has her baseline exertional dyspnea. She has been noticing some generalized fatigue, and her PCP is evaluating this. She denies any worsening usual exertional dyspnea. She will get short of breath going up stairs, but not with walking on flat ground. She indicates that she stays active "piddling around" DOE up stairs - but no CP.    No chest pain with rest or exertion. No PND, orthopnea or edema; mild varicose veins. Rare occasional palpitations; occasional dizziness after standing a long time.  No syncope/near syncope. No TIA/amaurosis fugax symptoms. No melena, hematochezia, hematuria, or epstaxis. No claudication.  ROS: A comprehensive was performed. Review of Systems  Constitutional: Negative for chills and fever.  HENT: Negative for nosebleeds.   Respiratory: Positive for cough, shortness of breath and wheezing.        Baseline COPD  Cardiovascular:       Per HPI  Gastrointestinal: Positive for heartburn (takes PPI ). Negative for blood in stool, diarrhea, melena, nausea and vomiting.  Genitourinary: Negative for dysuria and hematuria.  Musculoskeletal: Positive for joint pain (R knee - & up thigh pain, esp when driving. ). Negative for falls.  Neurological: Negative  for seizures and loss of consciousness.  Endo/Heme/Allergies: Does not bruise/bleed easily.  All other systems reviewed and are negative.  I have reviewed and (if needed) personally updated the patient's problem list, medications, allergies, past medical and surgical history, social and family history.   Past Medical History:  Diagnosis Date  . Allergy   . Asthma   . CAD S/P percutaneous coronary angioplasty February 2007   Class III Angina --> Cath: 85% -- PCI 3.26mm x 68mm (4.0 mm) Cypher DES to RCA; Myoview October 2014: normal stress test: LOW RISK; mild anteroapical breast attenuation  . Cataract    h/o bilat repair  . Depression   . Depression 11/29/2013  . Dyslipidemia   . Emphysema of lung ([place])   . GERD (gastroesophageal reflux disease)   . History of palpitations   . History of tobacco abuse   . Hypertension    well-controlled  . Hypothyroidism   . Osteoporosis   . Raynaud disease     Past Surgical History:  Procedure Laterality Date  . ABDOMINAL HYSTERECTOMY     h/o partila hysterectomy  . BREAST BIOPSY  1988  . CORONARY ANGIOPLASTY WITH STENT PLACEMENT  10/14/2005   cypher DES (3.84mmx18mm) to high grade RCA lesion  . NM MYOVIEW LTD  July 2012 of October 2014   '12: dipyridamole; Normal, low risk study; 2014 - normal stress test: LOW RISK; mild anteroapical breast attenuation  . TONSILLECTOMY  1959  . TRANSTHORACIC ECHOCARDIOGRAM  05/22/2009   EF=>55%, normal LV systolic function; normal RV systolic function; mild MR/TR  Current Meds  Medication Sig  . atorvastatin (LIPITOR) 40 MG tablet TAKE 1 TABLET BY MOUTH EVERY DAY  . benzonatate (TESSALON) 200 MG capsule TAKE ONE CAPSULE BY MOUTH 3 TIMES A DAY AS NEEDED FOR COUGH  . clopidogrel (PLAVIX) 75 MG tablet TAKE 1 TABLET BY MOUTH EVERY DAY  . cromolyn (OPTICROM) 4 % ophthalmic solution Place 1 drop into both eyes 4 (four) times daily.  Marland Kitchen estradiol (ESTRACE) 1 MG tablet TAKE 1 TABLET BY MOUTH DAILY  . Eszopiclone  3 MG TABS TAKE 1 TABLET BY MOUTH AT BEDTIME AS NEEDED  . levothyroxine (SYNTHROID, LEVOTHROID) 88 MCG tablet TAKE 1 TABLET BY MOUTH EVERY DAY  . meclizine (ANTIVERT) 25 MG tablet Take 1 tablet (25 mg total) by mouth 3 (three) times daily as needed for dizziness.  . nitroGLYCERIN (NITROSTAT) 0.4 MG SL tablet Place 1 tablet (0.4 mg total) under the tongue every 5 (five) minutes as needed for chest pain.  . pantoprazole (PROTONIX) 40 MG tablet TAKE 1 TABLET BY MOUTH EVERY DAY  . PARoxetine (PAXIL) 40 MG tablet Take 1 tablet (40 mg total) by mouth every morning.  Marland Kitchen PROAIR HFA 108 (90 Base) MCG/ACT inhaler INHALE 2 PUFFS INTO THE LUNGS EVERY 4 (FOUR) HOURS AS NEEDED FOR WHEEZING OR SHORTNESS OF BREATH.  Marland Kitchen SPIRIVA HANDIHALER 18 MCG inhalation capsule PLACE 1 CAPSULE (18 MCG TOTAL) INTO INHALER AND INHALE DAILY.    Allergies  Allergen Reactions  . Bystolic [Nebivolol Hcl] Hives  . Cephalexin Hives  . Levaquin [Levofloxacin In D5w] Other (See Comments)    Caused face to turn red and patient became extremely hot     Social History   Social History  . Marital status: Widowed    Spouse name: N/A  . Number of children: 3  . Years of education: 10th    Occupational History  . Retired    Social History Main Topics  . Smoking status: Current Every Day Smoker    Packs/day: 0.50    Years: 35.00    Types: Cigarettes  . Smokeless tobacco: Never Used  . Alcohol use No  . Drug use: No  . Sexual activity: Not Asked   Other Topics Concern  . None   Social History Narrative   She is a widowed mother of 24, grandmother for, great-grandmother 85.  She still smokes about half pack a day.  She doesn't have about 35 years.  She does not drink.   Prior to her symptoms coming on, she used to do all kind of walking around and working in the garden and other activities with her close friend.    family history includes Heart Problems in her sister; Heart attack in her mother; Liver cancer in her  mother.  Wt Readings from Last 3 Encounters:  04/01/17 167 lb (75.8 kg)  03/08/17 169 lb 12.8 oz (77 kg)  02/08/17 168 lb 12.8 oz (76.6 kg)    PHYSICAL EXAM BP (!) 144/72   Pulse (!) 56   Ht 5\' 1"  (1.549 m)   Wt 167 lb (75.8 kg)   BMI 31.55 kg/m  Physical Exam  Constitutional: She is oriented to person, place, and time. She appears well-developed and well-nourished. No distress.  Eyes: EOM are normal.  Neck: Normal range of motion. Neck supple. No JVD present. Carotid bruit is not present.  Cardiovascular: Regular rhythm and intact distal pulses.   No extrasystoles are present. Bradycardia present.  Exam reveals no gallop and no friction rub.  No murmur heard. Pulmonary/Chest: Breath sounds normal. No respiratory distress. She has no wheezes. She exhibits no tenderness.  Abdominal: Soft. Bowel sounds are normal. She exhibits no distension. There is no rebound.  Musculoskeletal: Normal range of motion. She exhibits no edema.  Mild spider veins/telangiectasias on both ankles  Neurological: She is alert and oriented to person, place, and time.  Skin: Skin is warm and dry. No rash noted. No erythema.  Psychiatric: She has a normal mood and affect. Her behavior is normal. Judgment and thought content normal.  Nursing note and vitals reviewed.   Adult ECG Report  Rate: 56 ;  Rhythm: sinus bradycardia and Otherwise normal axis, intervals and durations;   Narrative Interpretation: Stable EKG   Other studies Reviewed: Additional studies/ records that were reviewed today include:  Recent Labs:   - PCP checked lipids recently --"looked good" Lab Results  Component Value Date   CHOL 156 03/09/2017   HDL 64 03/09/2017   LDLCALC 74 03/09/2017   TRIG 89 03/09/2017   CHOLHDL 2.4 03/09/2017   Lab Results  Component Value Date   CREATININE 0.93 03/09/2017   BUN 9 03/09/2017   NA 137 03/09/2017   K 4.8 03/09/2017   CL 104 03/09/2017   CO2 24 03/09/2017    ASSESSMENT /  PLAN: Problem List Items Addressed This Visit    CAD S/P percutaneous coronary angioplasty - Primary (Chronic)    No recurrent anginal symptoms. Last Myoview was in 2014. No active symptoms. She has her baseline exertional dyspnea but no anginal symptoms. She does have first generation Cypher DES stents, therefore we are maintaining treatment with Plavix for antiplatelet agent. She is on statin. Not on a beta blocker or ACE inhibitor because of hypotension on medications. Blood pressure looks mildly elevated today, but she does have resting bradycardia. May consider ARB in the future.      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 Notably continues to walk. Continue Plavix and lipid management.      Dyslipidemia (Chronic)    Recent labs show LDL 74 -- pretty much at goal. Continue current dose statin.      Essential hypertension (Chronic)    Blood pressure is borderline elevated today, may need to consider ARB in the future. No beta blocker due to resting bradycardia.      Obesity (BMI 30-39.9) (Chronic)    The patient understands the need to lose weight with diet and exercise. We have discussed specific strategies for this.      Stable angina (HCC) (Chronic)    Chronic stable angina. No recurrent episodes. No repeat use of nitroglycerin.  Not currently on an antianginal regimen besides dementia.      Relevant Orders   EKG 12-Lead (Completed)   Tobacco abuse (Chronic)    Smoking cessation instruction/counseling given:  counseled patient on the dangers of tobacco use, advised patient to stop smoking, and reviewed strategies to maximize success      Relevant Orders   EKG 12-Lead (Completed)      Current medicines are reviewed at length with the patient today. (+/- concerns) na/ The following changes have been made: n/a  Patient Instructions  NO CHANGES WITH CURRENT MEDICATIONS     Your physician  wants you to follow-up in [place]. You will receive a reminder letter in the mail two months in advance. If you don't receive a letter, please call  our office to schedule the follow-up appointment.    If you need a refill on your cardiac medications before your next appointment, please call your pharmacy.     Studies Ordered:   Orders Placed This Encounter  Procedures  . EKG 12-Lead      Glenetta Hew, M.D., M.S. Interventional Cardiologist   Pager # [phone] Phone # [phone] [address] [place], [place] [address]

## 2017-04-01 NOTE — Patient Instructions (Signed)
NO CHANGES WITH CURRENT MEDICATIONS     Your physician wants you to follow-up in [place]. You will receive a reminder letter in the mail two months in advance. If you don't receive a letter, please call our office to schedule the follow-up appointment.    If you need a refill on your cardiac medications before your next appointment, please call your pharmacy.

## 2017-04-03 ENCOUNTER — Encounter: Payer: Self-pay | Admitting: Cardiology

## 2017-04-03 NOTE — Assessment & Plan Note (Signed)
Recent labs show LDL 74 -- pretty much at goal. Continue current dose statin.

## 2017-04-03 NOTE — Assessment & Plan Note (Addendum)
Blood pressure is borderline elevated today, may need to consider ARB in the future. No beta blocker due to resting bradycardia.

## 2017-04-03 NOTE — Assessment & Plan Note (Signed)
The patient understands the need to lose weight with diet and exercise. We have discussed specific strategies for this.  

## 2017-04-03 NOTE — Assessment & Plan Note (Signed)
Stable. Notably continues to walk. Continue Plavix and lipid management.

## 2017-04-03 NOTE — Assessment & Plan Note (Signed)
Chronic stable angina. No recurrent episodes. No repeat use of nitroglycerin.  Not currently on an antianginal regimen besides dementia.

## 2017-04-03 NOTE — Assessment & Plan Note (Signed)
No recurrent anginal symptoms. Last Myoview was in 2014. No active symptoms. She has her baseline exertional dyspnea but no anginal symptoms. She does have first generation Cypher DES stents, therefore we are maintaining treatment with Plavix for antiplatelet agent. She is on statin. Not on a beta blocker or ACE inhibitor because of hypotension on medications. Blood pressure looks mildly elevated today, but she does have resting bradycardia. May consider ARB in the future.

## 2017-04-03 NOTE — Assessment & Plan Note (Signed)
Smoking cessation instruction/counseling given:  counseled patient on the dangers of tobacco use, advised patient to stop smoking, and reviewed strategies to maximize success 

## 2017-04-12 ENCOUNTER — Other Ambulatory Visit: Payer: Self-pay | Admitting: Cardiology

## 2017-04-19 ENCOUNTER — Ambulatory Visit (INDEPENDENT_AMBULATORY_CARE_PROVIDER_SITE_OTHER): Payer: Medicare Other | Admitting: Physician Assistant

## 2017-04-19 ENCOUNTER — Encounter: Payer: Self-pay | Admitting: Physician Assistant

## 2017-04-19 VITALS — BP 132/80 | HR 59 | Temp 97.6°F | Resp 16 | Ht 61.0 in | Wt 165.0 lb

## 2017-04-19 DIAGNOSIS — F411 Generalized anxiety disorder: Secondary | ICD-10-CM | POA: Diagnosis not present

## 2017-04-19 DIAGNOSIS — L281 Prurigo nodularis: Secondary | ICD-10-CM

## 2017-04-19 DIAGNOSIS — R229 Localized swelling, mass and lump, unspecified: Secondary | ICD-10-CM

## 2017-04-19 DIAGNOSIS — I208 Other forms of angina pectoris: Secondary | ICD-10-CM

## 2017-04-19 DIAGNOSIS — L219 Seborrheic dermatitis, unspecified: Secondary | ICD-10-CM

## 2017-04-19 MED ORDER — KETOCONAZOLE 2 % EX SHAM: 1.0000 "application " | MEDICATED_SHAMPOO | CUTANEOUS | 0 refills | Status: DC

## 2017-04-19 MED ORDER — CLONAZEPAM 0.5 MG PO TABS: 0.5000 mg | ORAL_TABLET | Freq: Two times a day (BID) | ORAL | 1 refills | Status: DC | PRN

## 2017-04-19 NOTE — Progress Notes (Signed)
Patient ID: Alexa Orozco MRN: [id number], DOB: 1945-09-09, 71 y.o. Date of Encounter: 04/19/2017, 12:42 PM    Chief Complaint:  Chief Complaint  Patient presents with  . sores on head    x1 week     HPI: 71 y.o. year old female presents with above.   I noted that she had been having some recent office visits here regarding significant stressors in her life.  Today she reports that she has some sores on her head that she needs to get checked. She reports that she has always had "dry scalp ". She reports that she is not aware of any type of bites etc.  Discussed with her that some people pick that their skin when their "nerves" are uncontrolled. Asked if she thinks that she may be doing this.  She states that she definitely does find herself picking at her skin and picking at her scalp and does think that some of it is nerve related. Is unaware of any bites or specific causes for these areas other than the fact that she also has had problems with dry scalp. Otherwise has had no such lesions in the past.     Home Meds:   Outpatient Medications Prior to Visit  Medication Sig Dispense Refill  . atorvastatin (LIPITOR) 40 MG tablet TAKE 1 TABLET BY MOUTH EVERY DAY 90 tablet 3  . benzonatate (TESSALON) 200 MG capsule TAKE ONE CAPSULE BY MOUTH 3 TIMES A DAY AS NEEDED FOR COUGH 30 capsule 1  . clopidogrel (PLAVIX) 75 MG tablet TAKE 1 TABLET BY MOUTH EVERY DAY NEEDS APPT FOR REFILLS 30 tablet 10  . cromolyn (OPTICROM) 4 % ophthalmic solution Place 1 drop into both eyes 4 (four) times daily.    Marland Kitchen estradiol (ESTRACE) 1 MG tablet TAKE 1 TABLET BY MOUTH DAILY 90 tablet 0  . Eszopiclone 3 MG TABS TAKE 1 TABLET BY MOUTH AT BEDTIME AS NEEDED 30 tablet 3  . levothyroxine (SYNTHROID, LEVOTHROID) 88 MCG tablet TAKE 1 TABLET BY MOUTH EVERY DAY 90 tablet 0  . meclizine (ANTIVERT) 25 MG tablet Take 1 tablet (25 mg total) by mouth 3 (three) times daily as needed for dizziness. 30 tablet 0  . nitroGLYCERIN  (NITROSTAT) 0.4 MG SL tablet Place 1 tablet (0.4 mg total) under the tongue every 5 (five) minutes as needed for chest pain. 25 tablet 6  . pantoprazole (PROTONIX) 40 MG tablet TAKE 1 TABLET BY MOUTH EVERY DAY 90 tablet 1  . PARoxetine (PAXIL) 40 MG tablet Take 1 tablet (40 mg total) by mouth every morning. 90 tablet 1  . PROAIR HFA 108 (90 Base) MCG/ACT inhaler INHALE 2 PUFFS INTO THE LUNGS EVERY 4 (FOUR) HOURS AS NEEDED FOR WHEEZING OR SHORTNESS OF BREATH. 8.5 Inhaler 3  . SPIRIVA HANDIHALER 18 MCG inhalation capsule PLACE 1 CAPSULE (18 MCG TOTAL) INTO INHALER AND INHALE DAILY. 30 capsule 4   No facility-administered medications prior to visit.     Allergies:  Allergies  Allergen Reactions  . Bystolic [Nebivolol Hcl] Hives  . Cephalexin Hives  . Levaquin [Levofloxacin In D5w] Other (See Comments)    Caused face to turn red and patient became extremely hot       Review of Systems: See HPI for pertinent ROS. All other ROS negative.    Physical Exam: Blood pressure 132/80, pulse (!) 59, temperature 97.6 F (36.4 C), temperature source Oral, resp. rate 16, height 5\' 1"  (1.549 m), weight 165 lb (74.8 kg), SpO2 98 %.,  Body mass index is 31.18 kg/m. General:  WNWD WF. Appears in no acute distress. Neck: Supple. No thyromegaly. No lymphadenopathy. Lungs: Clear bilaterally to auscultation without wheezes, rales, or rhonchi. Breathing is unlabored. Heart: Regular rhythm. No murmurs, rubs, or gallops. Msk:  Strength and tone normal for age. Skin: There are 3 -4 areas of scab on scalp. Each of these is ~ 1cm diameter.  Inspection of scalp--there is some desquamation present--there is some scaling present in general. I see no additional lesions, no lice etc.  Neuro: Alert and oriented X 3. Moves all extremities spontaneously. Gait is normal. CNII-XII grossly in tact. Psych:  Responds to questions appropriately with a normal affect.     ASSESSMENT AND PLAN:  71 y.o. year old female with    1. Seborrheic dermatitis of scalp She will apply ketoconazole shampoo to the scalp as directed. She will continue her Paxil to help control her anxiety and depression in general but I will also give her clonazepam to use as needed when she feels that her nerves are not controlled or if she catches herself picking at her skin etc. Follow-up if these sites do not resolve or she continues to develop additional sites. Also discussed with her to be aware them of when she is picking at her skin scalp and to try to control herself to stop this. - ketoconazole (NIZORAL) 2 % shampoo; Apply 1 application topically 2 (two) times a week.  Dispense: 120 mL; Refill: 0  2. Generalized anxiety disorder - clonazePAM (KLONOPIN) 0.5 MG tablet; Take 1 tablet (0.5 mg total) by mouth 2 (two) times daily as needed for anxiety.  Dispense: 30 tablet; Refill: 1  3. Picker's nodules - clonazePAM (KLONOPIN) 0.5 MG tablet; Take 1 tablet (0.5 mg total) by mouth 2 (two) times daily as needed for anxiety.  Dispense: 30 tablet; Refill: 1   Signed, [address] [place], [place], [facility] 04/19/2017 12:42 PM

## 2017-05-14 ENCOUNTER — Other Ambulatory Visit: Payer: Self-pay | Admitting: Physician Assistant

## 2017-05-14 NOTE — Telephone Encounter (Signed)
Medication refilled per protocol. 

## 2017-05-19 ENCOUNTER — Ambulatory Visit: Payer: Medicare Other | Admitting: Physician Assistant

## 2017-05-19 ENCOUNTER — Encounter (HOSPITAL_COMMUNITY): Payer: Self-pay | Admitting: *Deleted

## 2017-05-19 ENCOUNTER — Telehealth: Payer: Self-pay | Admitting: Family Medicine

## 2017-05-19 ENCOUNTER — Emergency Department (HOSPITAL_COMMUNITY)
Admission: EM | Admit: 2017-05-19 | Discharge: 2017-05-20 | Disposition: A | Discharge status: Home / Self Care | Payer: Medicare Other | Attending: Emergency Medicine | Admitting: Emergency Medicine

## 2017-05-19 ENCOUNTER — Emergency Department (HOSPITAL_COMMUNITY): Payer: Medicare Other

## 2017-05-19 DIAGNOSIS — I2510 Atherosclerotic heart disease of native coronary artery without angina pectoris: Secondary | ICD-10-CM | POA: Insufficient documentation

## 2017-05-19 DIAGNOSIS — F17210 Nicotine dependence, cigarettes, uncomplicated: Secondary | ICD-10-CM | POA: Diagnosis not present

## 2017-05-19 DIAGNOSIS — Z7902 Long term (current) use of antithrombotics/antiplatelets: Secondary | ICD-10-CM | POA: Diagnosis not present

## 2017-05-19 DIAGNOSIS — M7989 Other specified soft tissue disorders: Secondary | ICD-10-CM | POA: Diagnosis not present

## 2017-05-19 DIAGNOSIS — I10 Essential (primary) hypertension: Secondary | ICD-10-CM | POA: Insufficient documentation

## 2017-05-19 DIAGNOSIS — J449 Chronic obstructive pulmonary disease, unspecified: Secondary | ICD-10-CM | POA: Diagnosis not present

## 2017-05-19 DIAGNOSIS — R220 Localized swelling, mass and lump, head: Secondary | ICD-10-CM | POA: Insufficient documentation

## 2017-05-19 DIAGNOSIS — H02849 Edema of unspecified eye, unspecified eyelid: Secondary | ICD-10-CM | POA: Diagnosis not present

## 2017-05-19 DIAGNOSIS — E039 Hypothyroidism, unspecified: Secondary | ICD-10-CM | POA: Diagnosis not present

## 2017-05-19 DIAGNOSIS — Z79899 Other long term (current) drug therapy: Secondary | ICD-10-CM | POA: Diagnosis not present

## 2017-05-19 LAB — I-STAT CHEM 8, ED
BUN: 17 mg/dL (ref 6–20)
CALCIUM ION: 1.03 mmol/L — AB (ref 1.15–1.40)
CHLORIDE: 103 mmol/L (ref 101–111)
Creatinine, Ser: 1.1 mg/dL — ABNORMAL HIGH (ref 0.44–1.00)
GLUCOSE: 76 mg/dL (ref 65–99)
HCT: 38 % (ref 36.0–46.0)
Hemoglobin: 12.9 g/dL (ref 12.0–15.0)
Potassium: 7.5 mmol/L (ref 3.5–5.1)
SODIUM: 136 mmol/L (ref 135–145)
TCO2: 32 mmol/L (ref 22–32)

## 2017-05-19 MED ORDER — IOPAMIDOL (ISOVUE-300) INJECTION 61%
INTRAVENOUS | Status: AC
  Administered 2017-05-20: 75 mL
  Filled 2017-05-19: qty 75

## 2017-05-19 MED ORDER — DIPHENHYDRAMINE HCL 50 MG/ML IJ SOLN
25.0000 mg | Freq: Once | INTRAMUSCULAR | Status: AC
  Administered 2017-05-19: 25 mg via INTRAVENOUS
  Filled 2017-05-19: qty 1

## 2017-05-19 NOTE — ED Triage Notes (Signed)
Pt reports taking a nap and waking up around 1500 with swelling to bilateral upper eyelids, nasal bridge and under her eyes. More severe under right eye. Denies new face products or food. Denies swelling to mouth. Has not tried any benadryl pta.

## 2017-05-19 NOTE — Telephone Encounter (Signed)
Agree 

## 2017-05-19 NOTE — Telephone Encounter (Signed)
[facility] office staff came to me to say patient called and said when awoke from nap she was having facial drooping, eyes swollen, and blurry vision.  Made appt for later to day.  I immediately called patient back and told she needed to go to ED ASAP.  This could be very serious.  Was hesitant but did convince her to go.  Told her if she did not go and showed up here for appt we would only call ambulance to take her to ED.  She may be having a stroke and the longer you wait for treatment the worse it can be.

## 2017-05-19 NOTE — ED Provider Notes (Signed)
[facility] Provider Note   CSN: [id number] Arrival date & time: 05/19/17  7124     History   Chief Complaint Chief Complaint  Patient presents with  . Facial Swelling    HPI Alexa Orozco is a 71 y.o. female.  Patient presents to the emergency department with chief complaint of bilateral swelling around her eyes. She reports that the symptoms started today after she took a nap.She reports that she has some pain in her forehead, which extends upward toward some sores on her head, which she says are from stress.  She has been seen for the sores on her head by her PCP, which are on both sides of her head.  She has not taken anything for these symptoms.  She states that she takes eye drops for her tear ducts, which she has been taking for months prescribed by her ophthalmologist.  She denies any fevers or chills.  She denies pain in her eyes.  She denies any changes in her vision.   The history is provided by the patient. No language interpreter was used.    Past Medical History:  Diagnosis Date  . Allergy   . Asthma   . CAD S/P percutaneous coronary angioplasty February 2007   Class III Angina --> Cath: 85% -- PCI 3.17mm x 30mm (4.0 mm) Cypher DES to RCA; Myoview October 2014: normal stress test: LOW RISK; mild anteroapical breast attenuation  . Cataract    h/o bilat repair  . Depression   . Depression 11/29/2013  . Dyslipidemia   . Emphysema of lung ([place])   . GERD (gastroesophageal reflux disease)   . History of palpitations   . History of tobacco abuse   . Hypertension    well-controlled  . Hypothyroidism   . Osteoporosis   . Raynaud disease     Patient Active Problem List   Diagnosis Date Noted  . Insomnia 09/19/2014  . Generalized anxiety disorder 09/19/2014  . COPD exacerbation ([place]) 08/20/2014  . Depression 11/29/2013  . Osteoporosis   . Claudication ([place]) 06/01/2013  . Obesity (BMI 30-39.9) 05/31/2013  . Stable angina ([place]) 05/31/2013  . Claudication of  left lower extremity ([place]) 05/31/2013  . CAD S/P percutaneous coronary angioplasty   . Dyslipidemia   . Lymphadenitis 03/21/2013  . Hirsutism 03/03/2011  . Essential hypertension   . Hypothyroid   . Raynaud's disease   . COPD (chronic obstructive pulmonary disease) ([place])   . Tobacco abuse     Past Surgical History:  Procedure Laterality Date  . ABDOMINAL HYSTERECTOMY     h/o partila hysterectomy  . BREAST BIOPSY  1988  . CORONARY ANGIOPLASTY WITH STENT PLACEMENT  10/14/2005   cypher DES (3.37mmx18mm) to high grade RCA lesion  . NM MYOVIEW LTD  July 2012 of October 2014   '12: dipyridamole; Normal, low risk study; 2014 - normal stress test: LOW RISK; mild anteroapical breast attenuation  . TONSILLECTOMY  1959  . TRANSTHORACIC ECHOCARDIOGRAM  05/22/2009   EF=>55%, normal LV systolic function; normal RV systolic function; mild MR/TR    OB History    No data available       Home Medications    Prior to Admission medications   Medication Sig Start Date End Date Taking? Authorizing Provider  atorvastatin (LIPITOR) 40 MG tablet TAKE 1 TABLET BY MOUTH EVERY DAY 05/25/16   Leonie Man, MD  benzonatate (TESSALON) 200 MG capsule TAKE ONE CAPSULE BY MOUTH 3 TIMES A DAY AS NEEDED FOR COUGH 02/11/17  Dena Billet B, PA-C  clonazePAM (KLONOPIN) 0.5 MG tablet Take 1 tablet (0.5 mg total) by mouth 2 (two) times daily as needed for anxiety. 04/19/17   Orlena Sheldon, PA-C  clopidogrel (PLAVIX) 75 MG tablet TAKE 1 TABLET BY MOUTH EVERY DAY NEEDS APPT FOR REFILLS 04/12/17   Leonie Man, MD  cromolyn (OPTICROM) 4 % ophthalmic solution Place 1 drop into both eyes 4 (four) times daily. 06/16/16   [provider]  estradiol (ESTRACE) 1 MG tablet TAKE 1 TABLET BY MOUTH DAILY 05/14/17   Dena Billet B, PA-C  Eszopiclone 3 MG TABS TAKE 1 TABLET BY MOUTH AT BEDTIME AS NEEDED 01/20/17   Orlena Sheldon, PA-C  ketoconazole (NIZORAL) 2 % shampoo Apply 1 application topically 2 (two) times a week.  04/19/17   Orlena Sheldon, PA-C  levothyroxine (SYNTHROID, LEVOTHROID) 88 MCG tablet TAKE 1 TABLET BY MOUTH EVERY DAY 03/01/17   Orlena Sheldon, PA-C  meclizine (ANTIVERT) 25 MG tablet Take 1 tablet (25 mg total) by mouth 3 (three) times daily as needed for dizziness. 06/22/16   Horton, Barbette Hair, MD  nitroGLYCERIN (NITROSTAT) 0.4 MG SL tablet Place 1 tablet (0.4 mg total) under the tongue every 5 (five) minutes as needed for chest pain. 05/31/13   Leonie Man, MD  pantoprazole (PROTONIX) 40 MG tablet TAKE 1 TABLET BY MOUTH EVERY DAY 03/01/17   Dena Billet B, PA-C  PARoxetine (PAXIL) 40 MG tablet Take 1 tablet (40 mg total) by mouth every morning. 02/03/17   Dena Billet B, PA-C  PROAIR HFA [phone] Base) MCG/ACT inhaler INHALE 2 PUFFS INTO THE LUNGS EVERY 4 (FOUR) HOURS AS NEEDED FOR WHEEZING OR SHORTNESS OF BREATH. 01/15/17   Dena Billet B, PA-C  SPIRIVA HANDIHALER 18 MCG inhalation capsule PLACE 1 CAPSULE (18 MCG TOTAL) INTO INHALER AND INHALE DAILY. 09/01/16   Orlena Sheldon, PA-C    Family History Family History  Problem Relation Age of Onset  . Liver cancer Mother   . Heart attack Mother   . Heart Problems Sister   . Breast cancer Neg Hx     Social History Social History  Substance Use Topics  . Smoking status: Current Every Day Smoker    Packs/day: 0.50    Years: 35.00    Types: Cigarettes  . Smokeless tobacco: Never Used  . Alcohol use No     Allergies   Bystolic [nebivolol hcl]; Cephalexin; and Levaquin [levofloxacin in d5w]   Review of Systems Review of Systems  All other systems reviewed and are negative.    Physical Exam Updated Vital Signs BP (!) 157/98   Pulse (!) 52   Temp 97.6 F (36.4 C) (Oral)   Resp 17   SpO2 100%   Physical Exam  Constitutional: She is oriented to person, place, and time. She appears well-developed and well-nourished.  HENT:  Head: Normocephalic and atraumatic.  Eyes: Pupils are equal, round, and reactive to light. Conjunctivae and EOM  are normal.  Periorbital swelling and erythema with some tenderness to her forehead No entrapment  Neck: Normal range of motion. Neck supple.  Cardiovascular: Normal rate and regular rhythm.  Exam reveals no gallop and no friction rub.   No murmur heard. Pulmonary/Chest: Effort normal and breath sounds normal. No respiratory distress. She has no wheezes. She has no rales. She exhibits no tenderness.  Abdominal: Soft. Bowel sounds are normal. She exhibits no distension and no mass. There is no tenderness. There is no  rebound and no guarding.  Musculoskeletal: Normal range of motion. She exhibits no edema or tenderness.  Neurological: She is alert and oriented to person, place, and time.  Skin: Skin is warm and dry.  Psychiatric: She has a normal mood and affect. Her behavior is normal. Judgment and thought content normal.  Nursing note and vitals reviewed.    ED Treatments / Results  Labs (all labs ordered are listed, but only abnormal results are displayed) Labs Reviewed  CBC WITH DIFFERENTIAL/PLATELET  I-STAT CHEM 8, ED    EKG  EKG Interpretation None       Radiology No results found.  Procedures Procedures (including critical care time)  Medications Ordered in ED Medications  diphenhydrAMINE (BENADRYL) injection 25 mg (not administered)     Initial Impression / Assessment and Plan / ED Course  I have reviewed the triage vital signs and the nursing notes.  Pertinent labs & imaging results that were available during my care of the patient were reviewed by me and considered in my medical decision making (see chart for details).     Patient with periorbital swelling.  She also has some sores on her scalp and some pain in her forehead.  Concern for possible spread of infection from the sores on her scalp caudally.  Will check CT orbits.  If negative for infectious findings, will try treatment for allergic response.  I-stat potassium is 7.5, normal Cr.  Suspect  hemolysis.  Will get EKG and recheck K.  Delta K is normal.  Final Clinical Impressions(s) / ED Diagnoses   Final diagnoses:  Facial swelling    New Prescriptions New Prescriptions   DIPHENHYDRAMINE (BENADRYL) 25 MG TABLET    Take 1 tablet (25 mg total) by mouth every 8 (eight) hours as needed for itching (Rash).   FAMOTIDINE (PEPCID) 20 MG TABLET    Take 1 tablet (20 mg total) by mouth 2 (two) times daily.     Montine Circle, PA-C 05/20/17 [place], Delice Bison, DO 05/20/17 [phone]

## 2017-05-20 ENCOUNTER — Other Ambulatory Visit (HOSPITAL_COMMUNITY): Payer: Self-pay

## 2017-05-20 ENCOUNTER — Emergency Department (HOSPITAL_COMMUNITY): Payer: Medicare Other

## 2017-05-20 DIAGNOSIS — H02849 Edema of unspecified eye, unspecified eyelid: Secondary | ICD-10-CM | POA: Diagnosis not present

## 2017-05-20 DIAGNOSIS — R220 Localized swelling, mass and lump, head: Secondary | ICD-10-CM | POA: Diagnosis not present

## 2017-05-20 LAB — BASIC METABOLIC PANEL
ANION GAP: 6 (ref 5–15)
BUN: 12 mg/dL (ref 6–20)
CALCIUM: 8.9 mg/dL (ref 8.9–10.3)
CHLORIDE: 103 mmol/L (ref 101–111)
CO2: 27 mmol/L (ref 22–32)
CREATININE: 0.99 mg/dL (ref 0.44–1.00)
GFR calc non Af Amer: 57 mL/min — ABNORMAL LOW (ref >60)
Glucose, Bld: 99 mg/dL (ref 65–99)
Potassium: 3.6 mmol/L (ref 3.5–5.1)
SODIUM: 136 mmol/L (ref 135–145)

## 2017-05-20 LAB — CBC WITH DIFFERENTIAL/PLATELET
BASOS PCT: 0 %
Basophils Absolute: 0 10*3/uL (ref 0.0–0.1)
EOS ABS: 0.4 10*3/uL (ref 0.0–0.7)
Eosinophils Relative: 5 %
HCT: 38.9 % (ref 36.0–46.0)
HEMOGLOBIN: 13 g/dL (ref 12.0–15.0)
Lymphocytes Relative: 31 %
Lymphs Abs: 2.4 10*3/uL (ref 0.7–4.0)
MCH: 31.9 pg (ref 26.0–34.0)
MCHC: 33.4 g/dL (ref 30.0–36.0)
MCV: 95.6 fL (ref 78.0–100.0)
Monocytes Absolute: 0.8 10*3/uL (ref 0.1–1.0)
Monocytes Relative: 10 %
NEUTROS PCT: 54 %
Neutro Abs: 4.3 10*3/uL (ref 1.7–7.7)
Platelets: 297 10*3/uL (ref 150–400)
RBC: 4.07 MIL/uL (ref 3.87–5.11)
RDW: 13.9 % (ref 11.5–15.5)
WBC: 7.8 10*3/uL (ref 4.0–10.5)

## 2017-05-20 MED ORDER — DIPHENHYDRAMINE HCL 25 MG PO TABS: 25.0000 mg | ORAL_TABLET | Freq: Three times a day (TID) | ORAL | 0 refills | Status: DC | PRN

## 2017-05-20 MED ORDER — FAMOTIDINE 20 MG PO TABS: 20.0000 mg | ORAL_TABLET | Freq: Two times a day (BID) | ORAL | 0 refills | Status: DC

## 2017-05-24 ENCOUNTER — Other Ambulatory Visit: Payer: Self-pay | Admitting: Cardiology

## 2017-05-25 ENCOUNTER — Ambulatory Visit (INDEPENDENT_AMBULATORY_CARE_PROVIDER_SITE_OTHER): Payer: Medicare Other | Admitting: Family Medicine

## 2017-05-25 ENCOUNTER — Encounter: Payer: Self-pay | Admitting: Family Medicine

## 2017-05-25 VITALS — BP 126/80 | HR 58 | Temp 98.0°F | Resp 14 | Ht 61.0 in | Wt 162.0 lb

## 2017-05-25 DIAGNOSIS — I208 Other forms of angina pectoris: Secondary | ICD-10-CM

## 2017-05-25 DIAGNOSIS — R220 Localized swelling, mass and lump, head: Secondary | ICD-10-CM | POA: Diagnosis not present

## 2017-05-25 NOTE — Progress Notes (Signed)
Subjective:    Patient ID: Alexa Orozco, female    DOB: 03-Sep-1945, 71 y.o.   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 This weekend, she went to the emergency room with the sudden onset of periorbital swelling, facial swelling limited to her for head and around her eyes. She denies any new ingestions. She is on no new medication. She is not taking an ACE inhibitor, an angiotensin receptor blocker, or a calcium channel blocker. Her only recent medication change was the ketoconazole shampoo. Literature search does reveal an incidence of angioedema and facial swelling secondary to this albeit rare. She denies any food allergies or recent food ingestions. She was started on Benadryl and Pepcid in the emergency room and the swelling in her face has improved although she continues to have some mild infraorbital swelling bilaterally and some swelling in both cheeks. There is no rash anywhere on her body Past Medical History:  Diagnosis Date  . Allergy   . Asthma   . CAD S/P percutaneous coronary angioplasty February 2007   Class III Angina --> Cath: 85% -- PCI 3.85mm x 72mm (4.0 mm) Cypher DES to RCA; Myoview October 2014: normal stress test: LOW RISK; mild anteroapical breast attenuation  . Cataract    h/o bilat repair  . Depression   . Depression 11/29/2013  . Dyslipidemia   . Emphysema of lung ([place])   . GERD (gastroesophageal reflux disease)   . History of palpitations   . History of tobacco abuse   . Hypertension    well-controlled  . Hypothyroidism   . Osteoporosis   . Raynaud disease    Past Surgical History:  Procedure Laterality Date  . ABDOMINAL HYSTERECTOMY     h/o partila hysterectomy  . BREAST BIOPSY  1988  . CORONARY ANGIOPLASTY WITH STENT PLACEMENT  10/14/2005   cypher DES (3.15mmx18mm) to high grade RCA lesion  . NM MYOVIEW LTD  July 2012 of  October 2014   '12: dipyridamole; Normal, low risk study; 2014 - normal stress test: LOW RISK; mild anteroapical breast attenuation  . TONSILLECTOMY  1959  . TRANSTHORACIC ECHOCARDIOGRAM  05/22/2009   EF=>55%, normal LV systolic function; normal RV systolic function; mild MR/TR   Current Outpatient Prescriptions on File Prior to Visit  Medication Sig Dispense Refill  . atorvastatin (LIPITOR) 40 MG tablet TAKE 1 TABLET BY MOUTH EVERY DAY 90 tablet 3  . clonazePAM (KLONOPIN) 0.5 MG tablet Take 1 tablet (0.5 mg total) by mouth 2 (two) times daily as needed for anxiety. 30 tablet 1  . clopidogrel (PLAVIX) 75 MG tablet TAKE 1 TABLET BY MOUTH EVERY DAY NEEDS APPT FOR REFILLS 30 tablet 10  . cromolyn (OPTICROM) 4 % ophthalmic solution Place 1 drop into both eyes 4 (four) times daily.    Marland Kitchen estradiol (ESTRACE) 1 MG tablet TAKE 1 TABLET BY MOUTH DAILY 90 tablet 1  . Eszopiclone 3 MG TABS TAKE 1 TABLET BY MOUTH AT BEDTIME AS NEEDED 30 tablet 3  . famotidine (PEPCID) 20 MG tablet Take 1 tablet (20 mg total) by mouth 2 (two) times daily. 30 tablet 0  . ketoconazole (NIZORAL) 2 % shampoo Apply 1 application topically 2 (two) times a week. 120 mL 0  . levothyroxine (SYNTHROID, LEVOTHROID) 88 MCG tablet TAKE 1 TABLET BY MOUTH EVERY DAY 90 tablet 0  . meclizine (ANTIVERT)  25 MG tablet Take 1 tablet (25 mg total) by mouth 3 (three) times daily as needed for dizziness. 30 tablet 0  . nitroGLYCERIN (NITROSTAT) 0.4 MG SL tablet Place 1 tablet (0.4 mg total) under the tongue every 5 (five) minutes as needed for chest pain. 25 tablet 6  . pantoprazole (PROTONIX) 40 MG tablet TAKE 1 TABLET BY MOUTH EVERY DAY 90 tablet 1  . PARoxetine (PAXIL) 40 MG tablet Take 1 tablet (40 mg total) by mouth every morning. 90 tablet 1  . PROAIR HFA 108 (90 Base) MCG/ACT inhaler INHALE 2 PUFFS INTO THE LUNGS EVERY 4 (FOUR) HOURS AS NEEDED FOR WHEEZING OR SHORTNESS OF BREATH. 8.5 Inhaler 3  . SPIRIVA HANDIHALER 18 MCG inhalation capsule  PLACE 1 CAPSULE (18 MCG TOTAL) INTO INHALER AND INHALE DAILY. 30 capsule 4  . diphenhydrAMINE (BENADRYL) 25 MG tablet Take 1 tablet (25 mg total) by mouth every 8 (eight) hours as needed for itching (Rash). 30 tablet 0   No current facility-administered medications on file prior to visit.    Allergies  Allergen Reactions  . Bystolic [Nebivolol Hcl] Hives  . Cephalexin Hives  . Levaquin [Levofloxacin In D5w] Other (See Comments)    Caused face to turn red and patient became extremely hot    Social History   Social History  . Marital status: Widowed    Spouse name: N/A  . Number of children: 3  . Years of education: 10th    Occupational History  . Retired    Social History Main Topics  . Smoking status: Current Every Day Smoker    Packs/day: 0.50    Years: 35.00    Types: Cigarettes  . Smokeless tobacco: Never Used  . Alcohol use No  . Drug use: No  . Sexual activity: Not on file   Other Topics Concern  . Not on file   Social History Narrative   She is a widowed mother of 57, grandmother for, great-grandmother 63.  She still smokes about half pack a day.  She doesn't have about 35 years.  She does not drink.   Prior to her symptoms coming on, she used to do all kind of walking around and working in the garden and other activities with her close friend.     Review of Systems  All other systems reviewed and are negative.      Objective:   Physical Exam  Constitutional: She appears well-developed.  HENT:  Nose: Nose normal.  Mouth/Throat: Oropharynx is clear and moist. No oropharyngeal exudate.  Eyes: Conjunctivae are normal.  Neck: Neck supple.  Cardiovascular: Normal rate, regular rhythm and normal heart sounds.   Pulmonary/Chest: Effort normal and breath sounds normal. No stridor. No respiratory distress. She has no wheezes. She has no rales.  Abdominal: Soft. Bowel sounds are normal.  Musculoskeletal: She exhibits edema.  Lymphadenopathy:    She has no  cervical adenopathy.  Skin: No rash noted.  Vitals reviewed.         Assessment & Plan:  Facial swelling  I believe this represents an allergic reaction secondary to the ketoconazole shampoo. I have recommended that she discontinue the shampoo immediately and rinse her scalp thoroughly. Recheck in 48-72 hours. If the swelling is improving at that time, we will gradually wean the patient away from the Benadryl and the Pepcid. She does report some fatigue, some disequilibrium, and some slowed reaction time.  I believe this is secondary to the Benadryl which she is taking every 6  hours and I believe that waiting her off of this medication will improve her symptoms.

## 2017-05-26 ENCOUNTER — Other Ambulatory Visit: Payer: Self-pay | Admitting: Physician Assistant

## 2017-05-26 NOTE — Telephone Encounter (Signed)
Last OV 8/20 LAST REFILL 5/23 Ok to refill?

## 2017-05-26 NOTE — Telephone Encounter (Signed)
Approved. #[id number]. 

## 2017-05-26 NOTE — Telephone Encounter (Signed)
rx called into pharmacy

## 2017-05-31 ENCOUNTER — Other Ambulatory Visit: Payer: Self-pay | Admitting: Physician Assistant

## 2017-05-31 NOTE — Telephone Encounter (Signed)
Refill appropriate and filled per protocol. 

## 2017-06-14 ENCOUNTER — Other Ambulatory Visit: Payer: Self-pay | Admitting: Physician Assistant

## 2017-06-28 ENCOUNTER — Other Ambulatory Visit: Payer: Self-pay | Admitting: Physician Assistant

## 2017-06-28 DIAGNOSIS — R229 Localized swelling, mass and lump, unspecified: Secondary | ICD-10-CM

## 2017-06-28 DIAGNOSIS — F411 Generalized anxiety disorder: Secondary | ICD-10-CM

## 2017-06-28 DIAGNOSIS — L281 Prurigo nodularis: Secondary | ICD-10-CM

## 2017-06-29 NOTE — Telephone Encounter (Signed)
Approved. #30+2. 

## 2017-06-29 NOTE — Telephone Encounter (Signed)
rx called in

## 2017-06-29 NOTE — Telephone Encounter (Signed)
Last OV 04/19/2017 Last refill 04/19/2017 Ok to refill?

## 2017-07-19 ENCOUNTER — Other Ambulatory Visit: Payer: Self-pay | Admitting: Physician Assistant

## 2017-07-19 NOTE — Telephone Encounter (Signed)
rx filled per protocol  

## 2017-07-23 ENCOUNTER — Other Ambulatory Visit: Payer: Self-pay | Admitting: Physician Assistant

## 2017-07-23 DIAGNOSIS — F411 Generalized anxiety disorder: Secondary | ICD-10-CM

## 2017-07-26 NOTE — Telephone Encounter (Signed)
Medication refilled per protocol. 

## 2017-07-31 ENCOUNTER — Other Ambulatory Visit: Payer: Self-pay | Admitting: Physician Assistant

## 2017-08-02 NOTE — Telephone Encounter (Signed)
rx filled appropriate

## 2017-08-29 ENCOUNTER — Other Ambulatory Visit: Payer: Self-pay | Admitting: Physician Assistant

## 2017-09-04 ENCOUNTER — Other Ambulatory Visit: Payer: Self-pay | Admitting: Physician Assistant

## 2017-09-16 ENCOUNTER — Other Ambulatory Visit: Payer: Self-pay | Admitting: Physician Assistant

## 2017-09-16 NOTE — Telephone Encounter (Signed)
She is due for office visit and fasting labs.  I do not see any appointment scheduled.  Have her schedule visit for morning and come fasting to that appointment.

## 2017-09-16 NOTE — Telephone Encounter (Addendum)
Last OV 03/08/2017 Last refill 05/26/2017 Ok to refill?

## 2017-09-16 NOTE — Telephone Encounter (Signed)
Called patient to schedule an appointment. [place]

## 2017-10-18 ENCOUNTER — Ambulatory Visit (INDEPENDENT_AMBULATORY_CARE_PROVIDER_SITE_OTHER): Payer: Medicare Other | Admitting: Family Medicine

## 2017-10-18 ENCOUNTER — Encounter: Payer: Self-pay | Admitting: Family Medicine

## 2017-10-18 VITALS — BP 138/58 | HR 63 | Temp 98.6°F | Resp 18 | Ht 61.0 in | Wt 158.4 lb

## 2017-10-18 DIAGNOSIS — J441 Chronic obstructive pulmonary disease with (acute) exacerbation: Secondary | ICD-10-CM | POA: Diagnosis not present

## 2017-10-18 LAB — INFLUENZA A AND B AG, IMMUNOASSAY
INFLUENZA A ANTIGEN: NOT DETECTED
INFLUENZA B ANTIGEN: NOT DETECTED

## 2017-10-18 MED ORDER — ESZOPICLONE 3 MG PO TABS: 3.0000 mg | ORAL_TABLET | Freq: Every evening | ORAL | 3 refills | Status: DC | PRN

## 2017-10-18 MED ORDER — AZITHROMYCIN 250 MG PO TABS: ORAL_TABLET | ORAL | 0 refills | Status: DC

## 2017-10-18 MED ORDER — PREDNISONE 10 MG PO TABS: ORAL_TABLET | ORAL | 0 refills | Status: DC

## 2017-10-18 MED ORDER — BENZONATATE 200 MG PO CAPS: ORAL_CAPSULE | ORAL | 1 refills | Status: DC

## 2017-10-18 MED ORDER — CLOPIDOGREL BISULFATE 75 MG PO TABS: ORAL_TABLET | ORAL | 11 refills | Status: DC

## 2017-10-18 MED ORDER — MECLIZINE HCL 25 MG PO TABS: 25.0000 mg | ORAL_TABLET | Freq: Three times a day (TID) | ORAL | 0 refills | Status: DC | PRN

## 2017-10-18 NOTE — Patient Instructions (Addendum)
Take antibiotics continue mucinex and tessalon Use albuterol every 4 hours while awake

## 2017-10-18 NOTE — Progress Notes (Signed)
   Subjective:    Patient ID: Alexa Orozco, female    DOB: 07/18/1946, 72 y.o.   MRN: [id number]  Patient presents for Cough (> 1 week)  Cough with congestion, wheezing, continues to smoke  Thick sputum for past week, body aches past few days, she feels better then gets worse Had had the sweats last night, but didn't take temperature Taking mucinex and tessalon perrles  Using albuterol more often, using spiriva No flu shot   No vomiting or diarrhea, Cough worst at bedtime    Review Of Systems:  GEN- denies fatigue, fever, weight loss,weakness, recent illness HEENT- denies eye drainage, change in vision, +nasal discharge, CVS- denies chest pain, palpitations RESP- denies SOB, +cough, +wheeze ABD- denies N/V, change in stools, abd pain GU- denies dysuria, hematuria, dribbling, incontinence MSK- denies joint pain, +muscle aches, injury Neuro- denies headache, dizziness, syncope, seizure activity       Objective:    BP (!) 138/58   Pulse 63   Temp 98.6 F (37 C)   Resp 18   Ht 5\' 1"  (1.549 m)   Wt 158 lb 6.4 oz (71.8 kg)   SpO2 94%   BMI 29.93 kg/m  GEN- NAD, alert and oriented x3 HEENT- PERRL, EOMI, non injected sclera, pink conjunctiva, MMM, oropharynx clear,nares clear Neck- Supple, no LAD CVS- RRR, no murmur RESP-mild rhonchi, no wheeze, normal WOB, good air movement  ABD-NABS,soft,NT,ND EXT- No edema Pulses- Radial, DP- 2+   Flu Negative      Assessment & Plan:      Problem List Items Addressed This Visit      Unprioritized   COPD exacerbation (HCC) - Primary    Flu neg, given zpak, prednisone taper, tessalon, continue inhalerse, mucinex , CXR is still not improving      Relevant Medications   azithromycin (ZITHROMAX) 250 MG tablet   predniSONE (DELTASONE) 10 MG tablet   benzonatate (TESSALON) 200 MG capsule   Other Relevant Orders   Influenza A and B Ag, Immunoassay (Completed)      Note: This dictation was prepared with Dragon dictation along  with smaller phrase technology. Any transcriptional errors that result from this process are unintentional.

## 2017-10-18 NOTE — Assessment & Plan Note (Signed)
Flu neg, given zpak, prednisone taper, tessalon, continue inhalerse, mucinex , CXR is still not improving

## 2017-10-22 ENCOUNTER — Other Ambulatory Visit: Payer: Self-pay

## 2017-10-22 DIAGNOSIS — F411 Generalized anxiety disorder: Secondary | ICD-10-CM

## 2017-10-22 MED ORDER — PAROXETINE HCL 40 MG PO TABS: ORAL_TABLET | ORAL | 1 refills | Status: DC

## 2017-11-17 ENCOUNTER — Other Ambulatory Visit: Payer: Self-pay | Admitting: Physician Assistant

## 2017-11-17 DIAGNOSIS — Z1231 Encounter for screening mammogram for malignant neoplasm of breast: Secondary | ICD-10-CM

## 2017-12-04 ENCOUNTER — Other Ambulatory Visit: Payer: Self-pay | Admitting: Physician Assistant

## 2017-12-04 DIAGNOSIS — R229 Localized swelling, mass and lump, unspecified: Secondary | ICD-10-CM

## 2017-12-04 DIAGNOSIS — F411 Generalized anxiety disorder: Secondary | ICD-10-CM

## 2017-12-04 DIAGNOSIS — L281 Prurigo nodularis: Secondary | ICD-10-CM

## 2017-12-06 NOTE — Telephone Encounter (Signed)
NTBS----last visit with me regarding these issues was August.  Her visit in February was with Dr. Buelah Manis for completely different problem.  She needs office visit with me to follow-up this issue.

## 2017-12-06 NOTE — Telephone Encounter (Signed)
Last OV 10/18/2017 Last refill 06/29/2017 Ok to refill?

## 2017-12-09 ENCOUNTER — Other Ambulatory Visit: Payer: Self-pay | Admitting: Physician Assistant

## 2017-12-10 ENCOUNTER — Other Ambulatory Visit: Payer: Self-pay | Admitting: Physician Assistant

## 2017-12-10 ENCOUNTER — Ambulatory Visit
Admission: RE | Admit: 2017-12-10 | Discharge: 2017-12-10 | Disposition: A | Discharge status: Home / Self Care | Payer: Medicare Other | Source: Ambulatory Visit | Attending: Physician Assistant | Admitting: Physician Assistant

## 2017-12-10 DIAGNOSIS — Z1231 Encounter for screening mammogram for malignant neoplasm of breast: Secondary | ICD-10-CM | POA: Diagnosis not present

## 2018-01-07 ENCOUNTER — Other Ambulatory Visit: Payer: Self-pay | Admitting: Physician Assistant

## 2018-01-14 ENCOUNTER — Other Ambulatory Visit: Payer: Self-pay | Admitting: Physician Assistant

## 2018-01-18 ENCOUNTER — Other Ambulatory Visit: Payer: Self-pay

## 2018-01-18 ENCOUNTER — Encounter: Payer: Self-pay | Admitting: Family Medicine

## 2018-01-18 ENCOUNTER — Ambulatory Visit (INDEPENDENT_AMBULATORY_CARE_PROVIDER_SITE_OTHER): Payer: Medicare Other | Admitting: Family Medicine

## 2018-01-18 VITALS — BP 126/70 | HR 60 | Temp 98.2°F | Resp 18 | Ht 61.0 in | Wt 162.0 lb

## 2018-01-18 DIAGNOSIS — F411 Generalized anxiety disorder: Secondary | ICD-10-CM | POA: Diagnosis not present

## 2018-01-18 DIAGNOSIS — J302 Other seasonal allergic rhinitis: Secondary | ICD-10-CM | POA: Diagnosis not present

## 2018-01-18 DIAGNOSIS — J181 Lobar pneumonia, unspecified organism: Secondary | ICD-10-CM

## 2018-01-18 DIAGNOSIS — J441 Chronic obstructive pulmonary disease with (acute) exacerbation: Secondary | ICD-10-CM

## 2018-01-18 DIAGNOSIS — J189 Pneumonia, unspecified organism: Secondary | ICD-10-CM

## 2018-01-18 MED ORDER — AZITHROMYCIN 250 MG PO TABS
ORAL_TABLET | ORAL | 0 refills | Status: DC
Start: 2018-01-18 — End: 2018-03-09

## 2018-01-18 MED ORDER — CLONAZEPAM 0.5 MG PO TABS: ORAL_TABLET | ORAL | 2 refills | Status: DC

## 2018-01-18 MED ORDER — PREDNISONE 20 MG PO TABS: ORAL_TABLET | ORAL | 0 refills | Status: DC

## 2018-01-18 MED ORDER — BENZONATATE 200 MG PO CAPS: 200.0000 mg | ORAL_CAPSULE | Freq: Two times a day (BID) | ORAL | 0 refills | Status: DC | PRN

## 2018-01-18 MED ORDER — IPRATROPIUM-ALBUTEROL 0.5-2.5 (3) MG/3ML IN SOLN
3.0000 mL | Freq: Once | RESPIRATORY_TRACT | Status: AC
Start: 2018-01-18 — End: 2018-01-18
  Administered 2018-01-18: 3 mL via RESPIRATORY_TRACT

## 2018-01-18 MED ORDER — ALBUTEROL SULFATE (2.5 MG/3ML) 0.083% IN NEBU: 2.5000 mg | INHALATION_SOLUTION | Freq: Four times a day (QID) | RESPIRATORY_TRACT | 1 refills | Status: DC | PRN

## 2018-01-18 MED ORDER — MONTELUKAST SODIUM 10 MG PO TABS
10.0000 mg | ORAL_TABLET | Freq: Every day | ORAL | 3 refills | Status: DC
Start: 2018-01-18 — End: 2019-01-27

## 2018-01-18 NOTE — Patient Instructions (Signed)
Start a allergy medicine like zyrtec, allegra or claritin  (this is over the counter)  Start taking the prescription montelukast (allergy med) with it.

## 2018-01-18 NOTE — Progress Notes (Signed)
After giving patient a nebulizer treatment I walked her around the office to check her saturation rate. While walking around the office 3 times patient saturation rate remained between 94-96  With her pulse rate 68-70 respiration after walking was 18

## 2018-01-18 NOTE — Progress Notes (Signed)
Patient ID: Alexa Orozco, female    DOB: 09/18/1945, 72 y.o.   MRN: [id number]  PCP: Orlena Sheldon, PA-C  Chief Complaint  Patient presents with  . Cough    symptoms for x2weeks   . Nasal Congestion  . Headache  . sinus pressure  . refill klonopin    Subjective:   Alexa Orozco is a 72 y.o. female, presents to clinic with CC of Coughing more than normal x 2 weeks, horrible persistent coughing, productive sputum (last week was more green, now more clear).  Not sure if its allergies or bronchitis, but much more severe than baseline.  Her symptoms began with associated stuffy nose, nasal discharge and congestion, voice raspy and scratchy sore throat from coughing.  Productive cough has associated SOB wheeze and chest tightness, temporarily resolved with albuterol however she is using it several times a day.  She also has sweats associated with her coughing fits, and sore ribs and abdomen muscles from coughing..  Coughing is so bad when she takes deep breath and that just using both of her inhalers causes a coughing fit.  She denies any night sweats, chest pain, fever, weight loss.  This feels similar to past COPD exacerbations however it did start more with her allergies as opposed to other times when it starts with a head cold.  She denies any weight gain, lower extremity edema, PND, orthopnea, palpitations, near syncope, chest pain or pressure.  She is out of her Klonopin, which is prescribed by [name], requests refill.      Patient Active Problem List   Diagnosis Date Noted  . Insomnia 09/19/2014  . Generalized anxiety disorder 09/19/2014  . COPD exacerbation ([place]) 08/20/2014  . Depression 11/29/2013  . Osteoporosis   . Claudication ([place]) 06/01/2013  . Obesity (BMI 30-39.9) 05/31/2013  . Stable angina ([place]) 05/31/2013  . Claudication of left lower extremity ([place]) 05/31/2013  . CAD S/P percutaneous coronary angioplasty   . Dyslipidemia   . Lymphadenitis 03/21/2013  .  Hirsutism 03/03/2011  . Essential hypertension   . Hypothyroid   . Raynaud's disease   . COPD (chronic obstructive pulmonary disease) ([place])   . Tobacco abuse      Prior to Admission medications   Medication Sig Start Date End Date Taking? Authorizing Provider  albuterol (PROAIR HFA) 108 (90 Base) MCG/ACT inhaler INHALE 2 PUFFS INTO THE LUNGS EVERY 4 (FOUR) HOURS AS NEEDED FOR WHEEZING OR SHORTNESS OF BREATH. 07/19/17  Yes Dena Billet B, PA-C  atorvastatin (LIPITOR) 40 MG tablet TAKE 1 TABLET BY MOUTH EVERY DAY 05/24/17  Yes Leonie Man, MD  clonazePAM (KLONOPIN) 0.5 MG tablet TAKE 1 TABLET OP TWICE A DAY AS NEEDED FOR ANXIETY 01/18/18  Yes Delsa Grana, PA-C  clopidogrel (PLAVIX) 75 MG tablet TAKE 1 TABLET BY MOUTH EVERY DAY NEEDS APPT FOR REFILLS 10/18/17  Yes Avilla, Modena Nunnery, MD  cromolyn (OPTICROM) 4 % ophthalmic solution Place 1 drop into both eyes 4 (four) times daily. 06/16/16  Yes [provider]  estradiol (ESTRACE) 1 MG tablet TAKE 1 TABLET BY MOUTH EVERY DAY 01/07/18  Yes Dena Billet B, PA-C  Eszopiclone 3 MG TABS Take 1 tablet (3 mg total) by mouth at bedtime as needed. Take immediately before bedtime 10/18/17  Yes Petroleum, Modena Nunnery, MD  levothyroxine (SYNTHROID, LEVOTHROID) 88 MCG tablet TAKE 1 TABLET BY MOUTH EVERY DAY 12/09/17  Yes Orlena Sheldon, PA-C  meclizine (ANTIVERT) 25 MG tablet Take 1 tablet (25  mg total) by mouth 3 (three) times daily as needed for dizziness. 10/18/17  Yes Andrew, Modena Nunnery, MD  nitroGLYCERIN (NITROSTAT) 0.4 MG SL tablet Place 1 tablet (0.4 mg total) under the tongue every 5 (five) minutes as needed for chest pain. 05/31/13  Yes Leonie Man, MD  pantoprazole (PROTONIX) 40 MG tablet TAKE 1 TABLET BY MOUTH EVERY DAY 01/14/18  Yes Dena Billet B, PA-C  PARoxetine (PAXIL) 40 MG tablet TAKE 1 TABLET BY MOUTH EVERY DAY IN THE MORNING 10/22/17  Yes Dixon, Mary B, PA-C  SPIRIVA HANDIHALER 18 MCG inhalation capsule PLACE 1 CAPSULE (18 MCG TOTAL) INTO  INHALER AND INHALE DAILY. 06/14/17  Yes Orlena Sheldon, PA-C  diphenhydrAMINE (BENADRYL) 25 MG tablet Take 1 tablet (25 mg total) by mouth every 8 (eight) hours as needed for itching (Rash). 05/20/17 05/23/17  Montine Circle, PA-C     Allergies  Allergen Reactions  . Bystolic [Nebivolol Hcl] Hives  . Cephalexin Hives  . Levaquin [Levofloxacin In D5w] Other (See Comments)    Caused face to turn red and patient became extremely hot      Family History  Problem Relation Age of Onset  . Liver cancer Mother   . Heart attack Mother   . Heart Problems Sister   . Breast cancer Neg Hx      Social History   Socioeconomic History  . Marital status: Widowed    Spouse name: Not on file  . Number of children: 3  . Years of education: 10th   . Highest education level: Not on file  Occupational History  . Occupation: Retired  Scientific laboratory technician  . Financial resource strain: Not on file  . Food insecurity:    Worry: Not on file    Inability: Not on file  . Transportation needs:    Medical: Not on file    Non-medical: Not on file  Tobacco Use  . Smoking status: Current Every Day Smoker    Packs/day: 0.50    Years: 35.00    Pack years: 17.50    Types: Cigarettes  . Smokeless tobacco: Never Used  Substance and Sexual Activity  . Alcohol use: No  . Drug use: No  . Sexual activity: Not on file  Lifestyle  . Physical activity:    Days per week: Not on file    Minutes per session: Not on file  . Stress: Not on file  Relationships  . Social connections:    Talks on phone: Not on file    Gets together: Not on file    Attends religious service: Not on file    Active member of club or organization: Not on file    Attends meetings of clubs or organizations: Not on file    Relationship status: Not on file  . Intimate partner violence:    Fear of current or ex partner: Not on file    Emotionally abused: Not on file    Physically abused: Not on file    Forced sexual activity: Not on  file  Other Topics Concern  . Not on file  Social History Narrative   She is a widowed mother of 24, grandmother for, great-grandmother 83.  She still smokes about half pack a day.  She doesn't have about 35 years.  She does not drink.   Prior to her symptoms coming on, she used to do all kind of walking around and working in the garden and other activities with her close friend.  Review of Systems  Constitutional: Negative.  Negative for activity change, appetite change, chills, diaphoresis, fatigue, fever and unexpected weight change.  Respiratory: Positive for cough, chest tightness, shortness of breath and wheezing. Negative for apnea, choking and stridor.   Cardiovascular: Negative for palpitations and leg swelling.  Gastrointestinal: Negative for abdominal distention, abdominal pain, constipation, diarrhea, nausea and vomiting.  Genitourinary: Negative.   Musculoskeletal: Negative.   Skin: Negative.  Negative for color change, pallor and rash.  Allergic/Immunologic: Positive for environmental allergies.  Neurological: Negative.  Negative for dizziness, syncope, weakness, light-headedness and numbness.  Psychiatric/Behavioral: Negative.   All other systems reviewed and are negative.      Objective:    Vitals:   01/18/18 1111  BP: 126/70  Pulse: 60  Temp: 98.2 F (36.8 C)  TempSrc: Oral  SpO2: 97%  Weight: 162 lb (73.5 kg)  Height: 5\' 1"  (1.549 m)      Physical Exam  Constitutional: She is oriented to person, place, and time. She appears well-developed and well-nourished.  Non-toxic appearance. No distress.  HENT:  Head: Normocephalic and atraumatic.  Right Ear: External ear normal.  Left Ear: External ear normal.  Mouth/Throat: Uvula is midline and mucous membranes are normal. No oropharyngeal exudate.  Eyes: Pupils are equal, round, and reactive to light. Conjunctivae, EOM and lids are normal. No scleral icterus.  Neck: Normal range of motion and phonation  normal. Neck supple. No tracheal deviation present.  Cardiovascular: Normal rate, regular rhythm, normal heart sounds, intact distal pulses and normal pulses. Exam reveals no gallop and no friction rub.  No murmur heard. Pulses:      Radial pulses are 2+ on the right side, and 2+ on the left side.       Posterior tibial pulses are 2+ on the right side, and 2+ on the left side.  No lower extremity edema  Pulmonary/Chest: No stridor. No respiratory distress. She has no rhonchi. She has no rales. She exhibits no tenderness.  No tachypnea, splinted respiratory effort, with deep inspiration, triggers recurrent coughing, wheezing when coughing, otherwise severely diminished breath sounds bilaterally at the bases, no crackles  Abdominal: Soft. Normal appearance and bowel sounds are normal. She exhibits no distension and no mass. There is no tenderness. There is no rebound and no guarding.  Musculoskeletal: Normal range of motion. She exhibits no edema or deformity.  Lymphadenopathy:    She has no cervical adenopathy.  Neurological: She is alert and oriented to person, place, and time. She exhibits normal muscle tone. Coordination and gait normal.  Skin: Skin is warm, dry and intact. Capillary refill takes less than 2 seconds. No rash noted. She is not diaphoretic. No pallor.  Psychiatric: She has a normal mood and affect. Her speech is normal and behavior is normal.  Nursing note and vitals reviewed.         Assessment & Plan:      ICD-10-CM   1. Chronic obstructive pulmonary disease with acute exacerbation (HCC) J44.1 albuterol (PROVENTIL) (2.5 MG/3ML) 0.083% nebulizer solution    predniSONE (DELTASONE) 20 MG tablet    azithromycin (ZITHROMAX) 250 MG tablet    benzonatate (TESSALON) 200 MG capsule    ipratropium-albuterol (DUONEB) 0.5-2.5 (3) MG/3ML nebulizer solution 3 mL    montelukast (SINGULAIR) 10 MG tablet    DG Chest 2 View  2. Generalized anxiety disorder F41.1 clonazePAM  (KLONOPIN) 0.5 MG tablet  3. Seasonal allergies J30.2 predniSONE (DELTASONE) 20 MG tablet    montelukast (SINGULAIR) 10 MG  tablet    Patient to start over-the-counter second-generation antihistamine and add montelukast to it for seasonal allergy control, with breakthrough sneezing itching etc. patient was instructed to use Benadryl sparingly.  Patient was COPD exacerbation, no respiratory distress, no desaturation with ambulatory pulse ox, please see Vonna Kotyk documentation.    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  She notes having prior COPD work-up with pulmonary function test, but states it has been several years since having any chest x-rays.  I am  covering with steroid burst, breathing treatments, Z-Pak.  Patient is having such a difficult time utilizing her inhalers because of severe cough and bronchospasm with any deep inspiratory effort that she does not believe she is getting very much medication because she starts coughing.  Trying to obtain a nebulizer machine with albuterol vials so she can get more medicine and when she is having such severe coughing fits.  Delsa Grana, PA-C 01/18/18 11:20 AM

## 2018-01-19 ENCOUNTER — Ambulatory Visit
Admission: RE | Admit: 2018-01-19 | Discharge: 2018-01-19 | Disposition: A | Discharge status: Home / Self Care | Payer: Medicare Other | Source: Ambulatory Visit | Attending: Family Medicine | Admitting: Family Medicine

## 2018-01-19 DIAGNOSIS — R05 Cough: Secondary | ICD-10-CM | POA: Diagnosis not present

## 2018-01-20 ENCOUNTER — Telehealth: Payer: Self-pay | Admitting: Physician Assistant

## 2018-01-20 NOTE — Telephone Encounter (Signed)
Rx was printed when patient was in office this has been called in to the pharmacy

## 2018-01-20 NOTE — Progress Notes (Signed)
Xray shows COPD.  There is a possible area of pneumonia at the bottom of the right lung, we are that already with Zpak.  Please return for recheck if not feeling much better in 1 week.  We will repeat an Xray in 4 weeks post treatment to make sure it improves radiographically.

## 2018-01-20 NOTE — Telephone Encounter (Signed)
Pt needs refill on klonopin to [facility]

## 2018-01-20 NOTE — Addendum Note (Signed)
Addended by: Delsa Grana on: 01/20/2018 05:00 PM   Modules accepted: Orders

## 2018-02-14 ENCOUNTER — Other Ambulatory Visit: Payer: Self-pay | Admitting: Family Medicine

## 2018-02-14 NOTE — Telephone Encounter (Signed)
Ok to refill??  Last office visit/ refill 10/08/2017, #3 refills.

## 2018-02-14 NOTE — Telephone Encounter (Signed)
Have her schedule an office visit with me for early morning so that she can come fasting to do labs at the same time as her visit. She has complex medical problems that I was managing and she has not had a visit with me recently. Also overdue for lab work. Her medications include Lipitor, Synthroid.  Last labs for these were 02/2017.

## 2018-02-15 NOTE — Telephone Encounter (Signed)
Call placed to patient she has made an appointment to be seen on 6/20 @ 1100 patient states she will be fasting

## 2018-02-16 ENCOUNTER — Telehealth: Payer: Self-pay

## 2018-02-16 ENCOUNTER — Telehealth: Payer: Self-pay | Admitting: Physician Assistant

## 2018-02-16 ENCOUNTER — Other Ambulatory Visit: Payer: Medicare Other

## 2018-02-16 DIAGNOSIS — I10 Essential (primary) hypertension: Secondary | ICD-10-CM

## 2018-02-16 DIAGNOSIS — J441 Chronic obstructive pulmonary disease with (acute) exacerbation: Secondary | ICD-10-CM | POA: Diagnosis not present

## 2018-02-16 DIAGNOSIS — E785 Hyperlipidemia, unspecified: Secondary | ICD-10-CM | POA: Diagnosis not present

## 2018-02-16 DIAGNOSIS — E039 Hypothyroidism, unspecified: Secondary | ICD-10-CM | POA: Diagnosis not present

## 2018-02-16 NOTE — Telephone Encounter (Signed)
Will send in 1 month supply on her sleep medicine to hold her over until she can get in for office visit. As soon as her vehicle is repaired and she can physically get here for visit, needs to come in for OV. Send in Lunesta 3 mg 1 p.o. QHS prn #30+0.

## 2018-02-16 NOTE — Telephone Encounter (Addendum)
Patient says she would like to get refill on her sleep med if possible  Came in for lab work on 02/16/18, and will call and schedule appt to come in and follow up on labs  [phone] [facility]

## 2018-02-16 NOTE — Telephone Encounter (Signed)
Call placed to patient I had spoken with her and scheduled for her to come in for an office visit as well as lab work on 6/20. Patient states she has been having car trouble and came in today for lab work and would like to get her rx filled.  Explained to patient that she was suppose to come in for her office visit on 6/20  with lab work before medication could be refilled. Pls advise

## 2018-02-16 NOTE — Telephone Encounter (Signed)
error 

## 2018-02-17 ENCOUNTER — Ambulatory Visit: Payer: Self-pay | Admitting: Physician Assistant

## 2018-02-17 LAB — LIPID PANEL
CHOL/HDL RATIO: 2.3 (calc) (ref <5.0)
CHOLESTEROL: 158 mg/dL (ref <200)
HDL: 68 mg/dL (ref >50)
LDL Cholesterol (Calc): 77 mg/dL (calc)
Non-HDL Cholesterol (Calc): 90 mg/dL (calc) (ref <130)
Triglycerides: 57 mg/dL (ref <150)

## 2018-02-17 LAB — COMPREHENSIVE METABOLIC PANEL
AG Ratio: 1.3 (calc) (ref 1.0–2.5)
ALKALINE PHOSPHATASE (APISO): 93 U/L (ref 33–130)
ALT: 16 U/L (ref 6–29)
AST: 17 U/L (ref 10–35)
Albumin: 3.8 g/dL (ref 3.6–5.1)
BILIRUBIN TOTAL: 0.6 mg/dL (ref 0.2–1.2)
BUN: 12 mg/dL (ref 7–25)
CALCIUM: 9.4 mg/dL (ref 8.6–10.4)
CHLORIDE: 105 mmol/L (ref 98–110)
CO2: 29 mmol/L (ref 20–32)
CREATININE: 0.83 mg/dL (ref 0.60–0.93)
GLOBULIN: 2.9 g/dL (ref 1.9–3.7)
Glucose, Bld: 97 mg/dL (ref 65–99)
POTASSIUM: 4.9 mmol/L (ref 3.5–5.3)
SODIUM: 139 mmol/L (ref 135–146)
TOTAL PROTEIN: 6.7 g/dL (ref 6.1–8.1)

## 2018-02-17 LAB — CBC WITH DIFFERENTIAL/PLATELET
BASOS ABS: 31 {cells}/uL (ref 0–200)
Basophils Relative: 0.5 %
EOS ABS: 192 {cells}/uL (ref 15–500)
Eosinophils Relative: 3.1 %
HCT: 38.8 % (ref 35.0–45.0)
HEMOGLOBIN: 13.5 g/dL (ref 11.7–15.5)
Lymphs Abs: 2895 cells/uL (ref 850–3900)
MCH: 31.7 pg (ref 27.0–33.0)
MCHC: 34.8 g/dL (ref 32.0–36.0)
MCV: 91.1 fL (ref 80.0–100.0)
MONOS PCT: 9.3 %
MPV: 9.9 fL (ref 7.5–12.5)
Neutro Abs: 2505 cells/uL (ref 1500–7800)
Neutrophils Relative %: 40.4 %
PLATELETS: 342 10*3/uL (ref 140–400)
RBC: 4.26 10*6/uL (ref 3.80–5.10)
RDW: 12.6 % (ref 11.0–15.0)
TOTAL LYMPHOCYTE: 46.7 %
WBC mixed population: 577 cells/uL (ref 200–950)
WBC: 6.2 10*3/uL (ref 3.8–10.8)

## 2018-02-17 LAB — TSH: TSH: 0.7 m[IU]/L (ref 0.40–4.50)

## 2018-02-18 MED ORDER — ESZOPICLONE 3 MG PO TABS: 3.0000 mg | ORAL_TABLET | Freq: Every evening | ORAL | 0 refills | Status: DC | PRN

## 2018-02-18 NOTE — Telephone Encounter (Signed)
Call placed to patient she is aware medication will be called in

## 2018-03-09 ENCOUNTER — Ambulatory Visit (INDEPENDENT_AMBULATORY_CARE_PROVIDER_SITE_OTHER): Payer: Medicare Other | Admitting: Physician Assistant

## 2018-03-09 ENCOUNTER — Other Ambulatory Visit: Payer: Self-pay

## 2018-03-09 ENCOUNTER — Encounter: Payer: Self-pay | Admitting: Physician Assistant

## 2018-03-09 VITALS — BP 126/68 | HR 59 | Temp 97.4°F | Resp 14 | Ht 61.0 in | Wt 165.2 lb

## 2018-03-09 DIAGNOSIS — I2510 Atherosclerotic heart disease of native coronary artery without angina pectoris: Secondary | ICD-10-CM

## 2018-03-09 DIAGNOSIS — E785 Hyperlipidemia, unspecified: Secondary | ICD-10-CM | POA: Diagnosis not present

## 2018-03-09 DIAGNOSIS — I10 Essential (primary) hypertension: Secondary | ICD-10-CM | POA: Diagnosis not present

## 2018-03-09 DIAGNOSIS — E039 Hypothyroidism, unspecified: Secondary | ICD-10-CM | POA: Diagnosis not present

## 2018-03-09 DIAGNOSIS — F411 Generalized anxiety disorder: Secondary | ICD-10-CM | POA: Diagnosis not present

## 2018-03-09 DIAGNOSIS — J439 Emphysema, unspecified: Secondary | ICD-10-CM

## 2018-03-09 DIAGNOSIS — Z9861 Coronary angioplasty status: Secondary | ICD-10-CM | POA: Diagnosis not present
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
## 2018-03-10 ENCOUNTER — Other Ambulatory Visit: Payer: Self-pay

## 2018-03-10 DIAGNOSIS — I2510 Atherosclerotic heart disease of native coronary artery without angina pectoris: Secondary | ICD-10-CM

## 2018-03-10 DIAGNOSIS — Z9861 Coronary angioplasty status: Secondary | ICD-10-CM

## 2018-03-10 DIAGNOSIS — I209 Angina pectoris, unspecified: Secondary | ICD-10-CM

## 2018-03-10 DIAGNOSIS — E785 Hyperlipidemia, unspecified: Secondary | ICD-10-CM

## 2018-03-10 DIAGNOSIS — Z720 Tobacco use: Secondary | ICD-10-CM

## 2018-03-10 DIAGNOSIS — E669 Obesity, unspecified: Secondary | ICD-10-CM

## 2018-03-10 MED ORDER — NITROGLYCERIN 0.4 MG SL SUBL: 0.4000 mg | SUBLINGUAL_TABLET | SUBLINGUAL | 6 refills | Status: DC | PRN

## 2018-03-12 ENCOUNTER — Other Ambulatory Visit: Payer: Self-pay | Admitting: Physician Assistant

## 2018-03-21 ENCOUNTER — Other Ambulatory Visit: Payer: Self-pay | Admitting: Physician Assistant

## 2018-03-21 NOTE — Telephone Encounter (Signed)
Last OV 03/09/2018 Last refill 02/18/2018 Ok to refill?

## 2018-03-27 ENCOUNTER — Other Ambulatory Visit: Payer: Self-pay | Admitting: Physician Assistant

## 2018-04-10 ENCOUNTER — Other Ambulatory Visit: Payer: Self-pay | Admitting: Physician Assistant

## 2018-05-10 DIAGNOSIS — H10413 Chronic giant papillary conjunctivitis, bilateral: Secondary | ICD-10-CM | POA: Diagnosis not present

## 2018-05-10 DIAGNOSIS — H10023 Other mucopurulent conjunctivitis, bilateral: Secondary | ICD-10-CM | POA: Diagnosis not present

## 2018-05-10 DIAGNOSIS — H02831 Dermatochalasis of right upper eyelid: Secondary | ICD-10-CM | POA: Diagnosis not present

## 2018-05-10 DIAGNOSIS — H02834 Dermatochalasis of left upper eyelid: Secondary | ICD-10-CM | POA: Diagnosis not present

## 2018-05-10 DIAGNOSIS — H04123 Dry eye syndrome of bilateral lacrimal glands: Secondary | ICD-10-CM | POA: Diagnosis not present

## 2018-05-10 DIAGNOSIS — Z961 Presence of intraocular lens: Secondary | ICD-10-CM | POA: Diagnosis not present

## 2018-05-10 DIAGNOSIS — H1851 Endothelial corneal dystrophy: Secondary | ICD-10-CM | POA: Diagnosis not present

## 2018-05-10 DIAGNOSIS — H0262 Xanthelasma of right lower eyelid: Secondary | ICD-10-CM | POA: Diagnosis not present

## 2018-05-13 ENCOUNTER — Other Ambulatory Visit: Payer: Self-pay | Admitting: Cardiology

## 2018-06-12 ENCOUNTER — Other Ambulatory Visit: Payer: Self-pay | Admitting: Physician Assistant

## 2018-07-03 ENCOUNTER — Other Ambulatory Visit: Payer: Self-pay | Admitting: Physician Assistant

## 2018-07-07 ENCOUNTER — Encounter: Payer: Medicare Other | Admitting: Family Medicine

## 2018-07-09 ENCOUNTER — Other Ambulatory Visit: Payer: Self-pay | Admitting: Physician Assistant

## 2018-07-09 DIAGNOSIS — F411 Generalized anxiety disorder: Secondary | ICD-10-CM

## 2018-07-11 ENCOUNTER — Other Ambulatory Visit: Payer: Self-pay | Admitting: Physician Assistant

## 2018-07-15 ENCOUNTER — Other Ambulatory Visit: Payer: Self-pay

## 2018-08-11 ENCOUNTER — Ambulatory Visit (INDEPENDENT_AMBULATORY_CARE_PROVIDER_SITE_OTHER): Payer: Medicare Other | Admitting: Family Medicine

## 2018-08-11 ENCOUNTER — Encounter: Payer: Self-pay | Admitting: Family Medicine

## 2018-08-11 VITALS — BP 126/72 | HR 58 | Temp 98.3°F | Resp 17 | Ht 61.0 in | Wt 175.1 lb

## 2018-08-11 DIAGNOSIS — I10 Essential (primary) hypertension: Secondary | ICD-10-CM | POA: Diagnosis not present

## 2018-08-11 DIAGNOSIS — M79644 Pain in right finger(s): Secondary | ICD-10-CM | POA: Diagnosis not present

## 2018-08-11 DIAGNOSIS — J439 Emphysema, unspecified: Secondary | ICD-10-CM | POA: Diagnosis not present

## 2018-08-11 DIAGNOSIS — J441 Chronic obstructive pulmonary disease with (acute) exacerbation: Secondary | ICD-10-CM

## 2018-08-11 DIAGNOSIS — Z9861 Coronary angioplasty status: Secondary | ICD-10-CM

## 2018-08-11 DIAGNOSIS — Z1159 Encounter for screening for other viral diseases: Secondary | ICD-10-CM | POA: Diagnosis not present

## 2018-08-11 DIAGNOSIS — J302 Other seasonal allergic rhinitis: Secondary | ICD-10-CM | POA: Diagnosis not present

## 2018-08-11 DIAGNOSIS — L089 Local infection of the skin and subcutaneous tissue, unspecified: Secondary | ICD-10-CM

## 2018-08-11 DIAGNOSIS — I7300 Raynaud's syndrome without gangrene: Secondary | ICD-10-CM | POA: Diagnosis not present

## 2018-08-11 DIAGNOSIS — E039 Hypothyroidism, unspecified: Secondary | ICD-10-CM

## 2018-08-11 DIAGNOSIS — I2510 Atherosclerotic heart disease of native coronary artery without angina pectoris: Secondary | ICD-10-CM | POA: Diagnosis not present

## 2018-08-11 MED ORDER — AZITHROMYCIN 250 MG PO TABS: ORAL_TABLET | ORAL | 0 refills | Status: DC

## 2018-08-11 MED ORDER — BUDESONIDE-FORMOTEROL FUMARATE 160-4.5 MCG/ACT IN AERO: 2.0000 | INHALATION_SPRAY | Freq: Two times a day (BID) | RESPIRATORY_TRACT | 3 refills | Status: DC

## 2018-08-11 MED ORDER — PREDNISONE 20 MG PO TABS: 40.0000 mg | ORAL_TABLET | Freq: Every day | ORAL | 0 refills | Status: AC

## 2018-08-11 MED ORDER — IPRATROPIUM-ALBUTEROL 0.5-2.5 (3) MG/3ML IN SOLN
3.0000 mL | Freq: Once | RESPIRATORY_TRACT | Status: AC
  Administered 2018-08-11: 3 mL via RESPIRATORY_TRACT

## 2018-08-11 MED ORDER — GUAIFENESIN ER 600 MG PO TB12: 600.0000 mg | ORAL_TABLET | Freq: Two times a day (BID) | ORAL | 0 refills | Status: AC

## 2018-08-11 MED ORDER — BENZONATATE 100 MG PO CAPS: 100.0000 mg | ORAL_CAPSULE | Freq: Three times a day (TID) | ORAL | 0 refills | Status: DC | PRN

## 2018-08-11 MED ORDER — ALBUTEROL SULFATE HFA 108 (90 BASE) MCG/ACT IN AERS: 2.0000 | INHALATION_SPRAY | RESPIRATORY_TRACT | 0 refills | Status: DC | PRN

## 2018-08-11 NOTE — Progress Notes (Signed)
Patient ID: Alexa Orozco, female    DOB: 1946/05/15, 72 y.o.   MRN: [id number]  PCP: Orlena Sheldon, PA-C  Chief Complaint  Patient presents with  . Cough    Patient has c/o cough, chest congestion, and sob . Also has concerns of middle finger on right hand, and arm pain.Marland Kitchen Also has sores in head    Subjective:   Alexa Orozco is a 72 y.o. female, presents to clinic with CC of over one month of URI sx with cough and congestion, worsening COPD for the last week, much worse than baseline.  Also concerned about  a painful bump on her scalp. URI started a month ago, she was trying to "wait it out" most nasal sx have improved but she is coughing more than her normal daily cough, sometimes productive with purulent yellow - green - gray sputum, but sometimes dry cough, with worse wheeze and worsening exertional SOB than her baseline.  She has only used inhaler a few times and it does help. She takes Symbicort infrequently and possibly uses it as her rescue, but also states that she has the "red one" and knows that is also rescue inhaler.  She has had some mild improvement in her sx with mucinex and proair.  She does not know the last time she had lung testing done.  She says her baseline SOB is that some days she "can do everything" and some days she is very SOB with walking to the mailbox, over the past month her SOB is worse with smaller tasks.    She also has a painful bump on the top right of her head - says its similar to a scalp infection she had in the past.     Cough  This is a chronic problem. The current episode started more than 1 month ago. The problem has been gradually worsening. The problem occurs hourly. The cough is productive of purulent sputum. Associated symptoms include postnasal drip, rhinorrhea, shortness of breath and wheezing. Pertinent negatives include no chest pain, chills, ear congestion, ear pain, fever, headaches, heartburn, hemoptysis, myalgias, nasal congestion, rash, sore  throat, sweats or weight loss. The symptoms are aggravated by lying down and exercise. Risk factors for lung disease include smoking/tobacco exposure (current smoker). She has tried a beta-agonist inhaler for the symptoms. The treatment provided mild relief. Her past medical history is significant for bronchitis, COPD and emphysema. There is no history of pneumonia.  URI   This is a new problem. The current episode started more than 1 month ago. The problem has been gradually worsening. There has been no fever. Associated symptoms include coughing, a plugged ear sensation, rhinorrhea and wheezing. Pertinent negatives include no abdominal pain, chest pain, congestion, diarrhea, dysuria, ear pain, headaches, joint pain, joint swelling, nausea, neck pain, rash, sinus pain, sneezing, sore throat, swollen glands or vomiting.      Patient Active Problem List   Diagnosis Date Noted  . Insomnia 09/19/2014  . Generalized anxiety disorder 09/19/2014  . COPD exacerbation ([place]) 08/20/2014  . Depression 11/29/2013  . Osteoporosis   . Claudication ([place]) 06/01/2013  . Obesity (BMI 30-39.9) 05/31/2013  . Stable angina ([place]) 05/31/2013  . Claudication of left lower extremity ([place]) 05/31/2013  . CAD S/P percutaneous coronary angioplasty   . Dyslipidemia   . Lymphadenitis 03/21/2013  . Hirsutism 03/03/2011  . Essential hypertension   . Hypothyroid   . Raynaud's disease   . COPD (chronic obstructive pulmonary disease) ([place])   .  Tobacco abuse      Prior to Admission medications   Medication Sig Start Date End Date Taking? Authorizing Provider  albuterol (PROVENTIL HFA;VENTOLIN HFA) 108 (90 Base) MCG/ACT inhaler INHALE 2 PUFFS INTO THE LUNGS EVERY 4 HOURS AS NEEDED FOR WHEEZING OR SHORTNESS OF BREATH 03/28/18  Yes Dena Billet B, PA-C  albuterol (PROVENTIL) (2.5 MG/3ML) 0.083% nebulizer solution Take 3 mLs (2.5 mg total) by nebulization every 6 (six) hours as needed for wheezing or shortness of breath.  01/18/18  Yes Delsa Grana, PA-C  atorvastatin (LIPITOR) 40 MG tablet TAKE 1 TABLET BY MOUTH EVERY DAY 05/13/18  Yes Leonie Man, MD  clonazePAM (KLONOPIN) 0.5 MG tablet TAKE 1 TABLET OP TWICE A DAY AS NEEDED FOR ANXIETY 01/18/18  Yes Delsa Grana, PA-C  clopidogrel (PLAVIX) 75 MG tablet TAKE 1 TABLET BY MOUTH EVERY DAY NEEDS APPT FOR REFILLS 10/18/17  Yes [place], Modena Nunnery, MD  cromolyn (OPTICROM) 4 % ophthalmic solution Place 1 drop into both eyes 4 (four) times daily. 06/16/16  Yes [provider]  estradiol (ESTRACE) 1 MG tablet TAKE 1 TABLET BY MOUTH EVERY DAY 07/04/18  Yes Susy Frizzle, MD  Eszopiclone 3 MG TABS TAKE 1 TABLET BY MOUTH AT BEDTIME AS NEEDED 03/22/18  Yes Orlena Sheldon, PA-C  levothyroxine (SYNTHROID, LEVOTHROID) 88 MCG tablet TAKE 1 TABLET BY MOUTH EVERY DAY 06/13/18  Yes Orlena Sheldon, PA-C  meclizine (ANTIVERT) 25 MG tablet Take 1 tablet (25 mg total) by mouth 3 (three) times daily as needed for dizziness. 10/18/17  Yes [place], Modena Nunnery, MD  montelukast (SINGULAIR) 10 MG tablet Take 1 tablet (10 mg total) by mouth at bedtime. 01/18/18  Yes Delsa Grana, PA-C  nitroGLYCERIN (NITROSTAT) 0.4 MG SL tablet Place 1 tablet (0.4 mg total) under the tongue every 5 (five) minutes as needed for chest pain. 03/10/18  Yes Dena Billet B, PA-C  pantoprazole (PROTONIX) 40 MG tablet TAKE 1 TABLET BY MOUTH EVERY DAY 07/12/18  Yes Susy Frizzle, MD  PARoxetine (PAXIL) 40 MG tablet TAKE 1 TABLET BY MOUTH EVERY DAY IN THE MORNING 07/12/18  Yes Susy Frizzle, MD     Allergies  Allergen Reactions  . Bystolic [Nebivolol Hcl] Hives  . Cephalexin Hives  . Levaquin [Levofloxacin In D5w] Other (See Comments)    Caused face to turn red and patient became extremely hot      Family History  Problem Relation Age of Onset  . Liver cancer Mother   . Heart attack Mother   . Heart Problems Sister   . Breast cancer Neg Hx      Social History   Socioeconomic History  .  Marital status: Widowed    Spouse name: Not on file  . Number of children: 3  . Years of education: 10th   . Highest education level: Not on file  Occupational History  . Occupation: Retired  Scientific laboratory technician  . Financial resource strain: Not on file  . Food insecurity:    Worry: Not on file    Inability: Not on file  . Transportation needs:    Medical: Not on file    Non-medical: Not on file  Tobacco Use  . Smoking status: Current Every Day Smoker    Packs/day: 0.50    Years: 35.00    Pack years: 17.50    Types: Cigarettes  . Smokeless tobacco: Never Used  Substance and Sexual Activity  . Alcohol use: No  . Drug use: No  .  Sexual activity: Not on file  Lifestyle  . Physical activity:    Days per week: Not on file    Minutes per session: Not on file  . Stress: Not on file  Relationships  . Social connections:    Talks on phone: Not on file    Gets together: Not on file    Attends religious service: Not on file    Active member of club or organization: Not on file    Attends meetings of clubs or organizations: Not on file    Relationship status: Not on file  . Intimate partner violence:    Fear of current or ex partner: Not on file    Emotionally abused: Not on file    Physically abused: Not on file    Forced sexual activity: Not on file  Other Topics Concern  . Not on file  Social History Narrative   She is a widowed mother of 71, grandmother for, great-grandmother 64.  She still smokes about half pack a day.  She doesn't have about 35 years.  She does not drink.   Prior to her symptoms coming on, she used to do all kind of walking around and working in the garden and other activities with her close friend.     Review of Systems  Constitutional: Negative.  Negative for appetite change, chills, diaphoresis, fever, unexpected weight change and weight loss.  HENT: Positive for postnasal drip and rhinorrhea. Negative for congestion, ear pain, sinus pressure, sinus pain,  sneezing, sore throat, trouble swallowing and voice change.   Eyes: Negative.   Respiratory: Positive for cough, chest tightness, shortness of breath and wheezing. Negative for apnea, hemoptysis, choking and stridor.   Cardiovascular: Negative.  Negative for chest pain, palpitations and leg swelling.  Gastrointestinal: Negative.  Negative for abdominal pain, diarrhea, heartburn, nausea and vomiting.  Endocrine: Negative.   Genitourinary: Negative.  Negative for dysuria.  Musculoskeletal: Negative.  Negative for joint pain, myalgias and neck pain.  Skin: Negative.  Negative for color change, pallor and rash.  Allergic/Immunologic: Negative.   Neurological: Negative.  Negative for dizziness, tremors, syncope, weakness, light-headedness and headaches.  Hematological: Negative.  Negative for adenopathy. Does not bruise/bleed easily.  Psychiatric/Behavioral: Negative.   All other systems reviewed and are negative.      Objective:    Vitals:   08/11/18 1000  BP: 126/72  Pulse: (!) 56  Resp: 17  Temp: 98.3 F (36.8 C)  TempSrc: Oral  SpO2: 98%  Weight: 175 lb 2 oz (79.4 kg)  Height: 5\' 1"  (1.549 m)      Physical Exam Vitals signs and nursing note reviewed.  Constitutional:      General: She is not in acute distress.    Appearance: She is well-developed. She is not ill-appearing, toxic-appearing or diaphoretic.     Comments: Appears stated age, NAD, smells of smoke  HENT:     Head: Normocephalic and atraumatic.     Right Ear: Hearing, tympanic membrane, ear canal and external ear normal. There is no impacted cerumen.     Left Ear: Hearing, tympanic membrane, ear canal and external ear normal. There is no impacted cerumen.     Nose: Mucosal edema, congestion and rhinorrhea present.     Right Sinus: No maxillary sinus tenderness or frontal sinus tenderness.     Left Sinus: No maxillary sinus tenderness or frontal sinus tenderness.     Comments: Nasal mucosa erythematous     Mouth/Throat:  Mouth: Mucous membranes are moist. Mucous membranes are not pale.     Pharynx: Uvula midline. Posterior oropharyngeal erythema present. No oropharyngeal exudate or uvula swelling.     Tonsils: No tonsillar abscesses.  Eyes:     General:        Right eye: No discharge.        Left eye: No discharge.     Conjunctiva/sclera: Conjunctivae normal.     Pupils: Pupils are equal, round, and reactive to light.  Neck:     Musculoskeletal: Normal range of motion and neck supple.     Trachea: No tracheal deviation.  Cardiovascular:     Rate and Rhythm: Normal rate and regular rhythm.     Pulses: Normal pulses.          Radial pulses are 2+ on the right side and 2+ on the left side.     Heart sounds: Normal heart sounds. No murmur. No friction rub. No gallop.   Pulmonary:     Effort: Pulmonary effort is normal. No tachypnea, accessory muscle usage or respiratory distress.     Breath sounds: Decreased air movement (throughout all lung fields) present. No stridor or transmitted upper airway sounds. Wheezing (expiratory ) and rhonchi (scattered) present. No rales.  Abdominal:     General: Bowel sounds are normal. There is no distension.     Palpations: Abdomen is soft.     Tenderness: There is no abdominal tenderness.  Musculoskeletal: Normal range of motion.     Right hand: She exhibits normal range of motion, no deformity and no laceration.     Right lower leg: No edema.     Left lower leg: No edema.     Comments: Right 3rd PIP mildly enlarged, normal ROM, normal sensation and normal capillary refill  Lymphadenopathy:     Cervical: No cervical adenopathy.  Skin:    General: Skin is warm and dry.     Capillary Refill: Capillary refill takes less than 2 seconds.     Coloration: Skin is not cyanotic, mottled or pale.     Findings: No rash.     Nails: There is no clubbing.      Comments: Single 15mm pustule to top right parietal scalp with 5-10 mm circumferential erythema with  mild edema and ttp  Neurological:     Mental Status: She is alert.     Motor: No abnormal muscle tone.     Coordination: Coordination normal.     Gait: Gait normal.  Psychiatric:        Behavior: Behavior normal.      Wt Readings from Last 5 Encounters:  08/11/18 175 lb 2 oz (79.4 kg)  03/09/18 165 lb 3.2 oz (74.9 kg)  01/18/18 162 lb (73.5 kg)  10/18/17 158 lb 6.4 oz (71.8 kg)  05/25/17 162 lb (73.5 kg)         Assessment & Plan:   72 y/o pt with COPD/emphysema, current smoker, presents with one month of URI and worsening cough, wheeze and SOB, concerning for AECOPD.  Pt unfortunately continues to smoke - was advised that decreasing and quitting would significantly help her breathing.   She previously had a CXR with an infiltrate and was to get a f/up CXR after tx, she never did this.  With her presentation and continued smoking I encouraged her to still go to get CXR to reassess.  I suspect pt has AECOPD and less concerned that she has CAP, although she has had before.  She was encouraged to f/up if not improving.  Do not suspect CHF, pt appears euvolemic.  She was given a neb in clinic and BS improved - still fairly diminished throughout consistent with emphysema - but pt noted improvement in her SOB and wheeze.  She had a small pustule to her scalp, I did lance top of pustule after cleaning with alcohol and used a 18 g needle, and express small amount of purulence, pt tolerated and did not experience any pain.    She is fasting and is overdue for recheck of multiple chronic conditions - obtain labs today    ICD-10-CM   1. Acute exacerbation of chronic obstructive pulmonary disease (COPD) (HCC) J44.1 ipratropium-albuterol (DUONEB) 0.5-2.5 (3) MG/3ML nebulizer solution 3 mL    CBC with Differential/Platelet    azithromycin (ZITHROMAX) 250 MG tablet    albuterol (PROVENTIL HFA;VENTOLIN HFA) 108 (90 Base) MCG/ACT inhaler    budesonide-formoterol (SYMBICORT) 160-4.5 MCG/ACT  inhaler    benzonatate (TESSALON) 100 MG capsule    guaiFENesin (MUCINEX) 600 MG 12 hr tablet    Pulmonary function test  2. Pain of right middle finger M79.644 DG Finger Middle Right  3. Essential hypertension H85 COMPLETE METABOLIC PANEL WITH GFR   needs routine f/up appt - check renal function, BP good today, at goal  4. Pulmonary emphysema, unspecified emphysema type ([place]) I77.8 COMPLETE METABOLIC PANEL WITH GFR    Pulmonary function test   pt unsure about dx, currently still smoking, tx AECOPD and in a few weeks get LFTs - none recently  5. Hypothyroidism, unspecified type E03.9 TSH   recheck TSH  6. Seasonal allergies J30.2   7. Raynaud's disease without gangrene I73.00 CBC with Differential/Platelet  8. CAD S/P percutaneous coronary angioplasty I25.10 Lipid Panel   Z98.61   9. Need for hepatitis C screening test Z11.59 Hepatitis C antibody  10. Skin pustule L08.9    topical abx ointment     While trying to escort the patient out of exam room to check out she complains of left third finger PIP pain swelling stiffness and episodes of popping out of place are getting stuck with cramps.  Has noticed gradual joint swelling for a long time.  I offered x-ray to evaluate the joint space, patient had no tenderness, no erythema, mild right third PIP joint enlargement when compared to her left.  Do suspect secondary to arthritis.  No current deformity.  Encouraged pt to come for OV when symptomatic.   Delsa Grana, PA-C 08/11/18 10:23 AM

## 2018-08-11 NOTE — Patient Instructions (Signed)
In the next few weeks after you are feeling better and back to your baseline - please go to [facility] imaging to have the follow up Chest x-ray done.  I have ordered some basic labs and follow up lung tests.  This will help [place] know how much maintenance medication and support you need for your lung disease.    Start taking the steroids antibiotics and inhalers now  After symptoms have improved continue to do the Symbicort and only use albuterol either nebulizer or inhaler as needed for acute shortness of breath and wheeze.  When you have worsening symptoms such as coughing, wheeze, shortness of breath that do not get better with the albuterol and this happens several days in a row that is when you need to be rechecked because he can have worsening exacerbation of lung disease and you usually need more medication like steroids.

## 2018-08-12 LAB — COMPLETE METABOLIC PANEL WITH GFR
AG RATIO: 1.2 (calc) (ref 1.0–2.5)
ALT: 14 U/L (ref 6–29)
AST: 17 U/L (ref 10–35)
Albumin: 3.9 g/dL (ref 3.6–5.1)
Alkaline phosphatase (APISO): 96 U/L (ref 33–130)
BUN/Creatinine Ratio: 15 (calc) (ref 6–22)
BUN: 14 mg/dL (ref 7–25)
CALCIUM: 9.8 mg/dL (ref 8.6–10.4)
CHLORIDE: 104 mmol/L (ref 98–110)
CO2: 26 mmol/L (ref 20–32)
Creat: 0.95 mg/dL — ABNORMAL HIGH (ref 0.60–0.93)
GFR, EST AFRICAN AMERICAN: 70 mL/min/{1.73_m2} (ref >=60)
GFR, EST NON AFRICAN AMERICAN: 61 mL/min/{1.73_m2} (ref >=60)
GLOBULIN: 3.2 g/dL (ref 1.9–3.7)
Glucose, Bld: 81 mg/dL (ref 65–99)
POTASSIUM: 5 mmol/L (ref 3.5–5.3)
Sodium: 139 mmol/L (ref 135–146)
TOTAL PROTEIN: 7.1 g/dL (ref 6.1–8.1)
Total Bilirubin: 0.4 mg/dL (ref 0.2–1.2)

## 2018-08-12 LAB — LIPID PANEL
Cholesterol: 182 mg/dL (ref <200)
HDL: 81 mg/dL (ref >50)
LDL Cholesterol (Calc): 87 mg/dL (calc)
Non-HDL Cholesterol (Calc): 101 mg/dL (calc) (ref <130)
Total CHOL/HDL Ratio: 2.2 (calc) (ref <5.0)
Triglycerides: 62 mg/dL (ref <150)

## 2018-08-12 LAB — CBC WITH DIFFERENTIAL/PLATELET
BASOS ABS: 48 {cells}/uL (ref 0–200)
BASOS PCT: 0.7 %
Eosinophils Absolute: 331 cells/uL (ref 15–500)
Eosinophils Relative: 4.8 %
HEMATOCRIT: 40.1 % (ref 35.0–45.0)
HEMOGLOBIN: 13.3 g/dL (ref 11.7–15.5)
LYMPHS ABS: 2933 {cells}/uL (ref 850–3900)
MCH: 31.5 pg (ref 27.0–33.0)
MCHC: 33.2 g/dL (ref 32.0–36.0)
MCV: 95 fL (ref 80.0–100.0)
MPV: 10.3 fL (ref 7.5–12.5)
Monocytes Relative: 8.8 %
NEUTROS ABS: 2981 {cells}/uL (ref 1500–7800)
NEUTROS PCT: 43.2 %
Platelets: 337 10*3/uL (ref 140–400)
RBC: 4.22 10*6/uL (ref 3.80–5.10)
RDW: 12.6 % (ref 11.0–15.0)
Total Lymphocyte: 42.5 %
WBC mixed population: 607 cells/uL (ref 200–950)
WBC: 6.9 10*3/uL (ref 3.8–10.8)

## 2018-08-12 LAB — HEPATITIS C ANTIBODY
Hepatitis C Ab: NONREACTIVE
SIGNAL TO CUT-OFF: 0.03 (ref <1.00)

## 2018-08-12 LAB — TSH: TSH: 4.1 m[IU]/L (ref 0.40–4.50)

## 2018-08-25 NOTE — Progress Notes (Signed)
PFT's can be done every 2 years or so.  They help assess the lung function and progression or improvement of chronic disease.

## 2018-08-29 ENCOUNTER — Ambulatory Visit
Admission: RE | Admit: 2018-08-29 | Discharge: 2018-08-29 | Disposition: A | Discharge status: Home / Self Care | Payer: Medicare Other | Source: Ambulatory Visit | Attending: Family Medicine | Admitting: Family Medicine

## 2018-08-29 DIAGNOSIS — M79644 Pain in right finger(s): Secondary | ICD-10-CM

## 2018-08-29 DIAGNOSIS — J189 Pneumonia, unspecified organism: Secondary | ICD-10-CM

## 2018-08-29 DIAGNOSIS — J181 Lobar pneumonia, unspecified organism: Principal | ICD-10-CM

## 2018-08-29 DIAGNOSIS — R05 Cough: Secondary | ICD-10-CM | POA: Diagnosis not present

## 2018-09-12 ENCOUNTER — Other Ambulatory Visit: Payer: Self-pay | Admitting: *Deleted

## 2018-09-12 MED ORDER — LEVOTHYROXINE SODIUM 88 MCG PO TABS: 88.0000 ug | ORAL_TABLET | Freq: Every day | ORAL | 0 refills | Status: DC

## 2018-09-15 ENCOUNTER — Other Ambulatory Visit: Payer: Self-pay | Admitting: Family Medicine

## 2018-09-15 DIAGNOSIS — J441 Chronic obstructive pulmonary disease with (acute) exacerbation: Secondary | ICD-10-CM

## 2018-09-19 ENCOUNTER — Other Ambulatory Visit: Payer: Self-pay | Admitting: Family Medicine

## 2018-09-19 DIAGNOSIS — F411 Generalized anxiety disorder: Secondary | ICD-10-CM

## 2018-09-19 NOTE — Telephone Encounter (Signed)
Ok to refill??  Last office visit 08/11/2018.  Last refill 01/18/2018, #2 refills.

## 2018-09-20 ENCOUNTER — Other Ambulatory Visit: Payer: Self-pay | Admitting: *Deleted

## 2018-09-20 MED ORDER — ESZOPICLONE 3 MG PO TABS: 3.0000 mg | ORAL_TABLET | Freq: Every evening | ORAL | 5 refills | Status: DC | PRN

## 2018-09-20 NOTE — Telephone Encounter (Signed)
Received fax requesting refill on [place].   Ok to refill??  Last office visit 08/11/2018.  Last refill 03/22/2018, #5 refills.

## 2018-09-20 NOTE — Telephone Encounter (Signed)
Controlled Substance Prescriptions [place] Controlled Substance Registry consulted?pt gets meds monthly for eszopiclone, less frequently for klonopin. Pt previously managed by PA who left practice, med hx/dx confirmed prior to refills.   Meds refilled.

## 2018-09-25 ENCOUNTER — Other Ambulatory Visit: Payer: Self-pay | Admitting: Family Medicine

## 2018-10-05 ENCOUNTER — Other Ambulatory Visit: Payer: Self-pay | Admitting: Family Medicine

## 2018-11-02 ENCOUNTER — Telehealth: Payer: Self-pay | Admitting: Family Medicine

## 2018-11-02 ENCOUNTER — Other Ambulatory Visit: Payer: Self-pay | Admitting: Family Medicine

## 2018-11-02 ENCOUNTER — Other Ambulatory Visit: Payer: Self-pay | Admitting: Physician Assistant

## 2018-11-02 DIAGNOSIS — J441 Chronic obstructive pulmonary disease with (acute) exacerbation: Secondary | ICD-10-CM

## 2018-11-02 DIAGNOSIS — Z1231 Encounter for screening mammogram for malignant neoplasm of breast: Secondary | ICD-10-CM

## 2018-11-02 NOTE — Telephone Encounter (Signed)
Patient calling to say that she needs the pulmonary function test renewed in epic, it has expired and she wants to go get this done  [phone]

## 2018-11-03 NOTE — Telephone Encounter (Signed)
New referral was placed for this on yesterday 11/02/2018. Patient was notified and verbalized understanding.

## 2018-11-11 ENCOUNTER — Ambulatory Visit (INDEPENDENT_AMBULATORY_CARE_PROVIDER_SITE_OTHER): Payer: Medicare Other | Admitting: Pulmonary Disease

## 2018-11-11 ENCOUNTER — Encounter: Payer: Self-pay | Admitting: Pulmonary Disease

## 2018-11-11 ENCOUNTER — Other Ambulatory Visit: Payer: Self-pay

## 2018-11-11 VITALS — BP 122/78 | HR 52 | Ht 61.0 in | Wt 180.8 lb

## 2018-11-11 DIAGNOSIS — J441 Chronic obstructive pulmonary disease with (acute) exacerbation: Secondary | ICD-10-CM | POA: Diagnosis not present

## 2018-11-11 DIAGNOSIS — J42 Unspecified chronic bronchitis: Secondary | ICD-10-CM

## 2018-11-11 MED ORDER — BUDESONIDE-FORMOTEROL FUMARATE 160-4.5 MCG/ACT IN AERO: 2.0000 | INHALATION_SPRAY | Freq: Two times a day (BID) | RESPIRATORY_TRACT | 3 refills | Status: DC

## 2018-11-11 MED ORDER — TIOTROPIUM BROMIDE MONOHYDRATE 2.5 MCG/ACT IN AERS: 2.0000 | INHALATION_SPRAY | Freq: Every day | RESPIRATORY_TRACT | 3 refills | Status: DC

## 2018-11-11 MED ORDER — TIOTROPIUM BROMIDE MONOHYDRATE 2.5 MCG/ACT IN AERS: 2.0000 | INHALATION_SPRAY | Freq: Every day | RESPIRATORY_TRACT | 0 refills | Status: DC

## 2018-11-11 NOTE — Progress Notes (Signed)
   Subjective:    Patient ID: Alexa Orozco, female    DOB: 04-25-1946, 73 y.o.   MRN: [id number]  HPI    Review of Systems  Constitutional: Negative for fever and unexpected weight change.  HENT: Negative for congestion, dental problem, ear pain, nosebleeds, postnasal drip, rhinorrhea, sinus pressure, sneezing, sore throat and trouble swallowing.   Eyes: Negative for redness and itching.  Respiratory: Positive for cough, shortness of breath and wheezing.   Cardiovascular: Negative for palpitations and leg swelling.  Gastrointestinal: Negative for nausea and vomiting.  Genitourinary: Negative for dysuria.  Musculoskeletal: Positive for joint swelling.  Skin: Negative for rash.  Allergic/Immunologic: Negative.  Negative for environmental allergies, food allergies and immunocompromised state.  Neurological: Positive for headaches.  Hematological: Bruises/bleeds easily.  Psychiatric/Behavioral: Positive for dysphoric mood. The patient is nervous/anxious.        Objective:   Physical Exam        Assessment & Plan:

## 2018-11-11 NOTE — Patient Instructions (Signed)
Chronic Bronchitis -CONTINUE Symbicort 160-4.5 mcg 2 puffs twice daily -START Spiriva 2.5 mcg 2 puffs daily -CONTINUE Albuterol nebulizer as needed for shortness of breath or wheezing  Smoking Tobacco Information, Adult Smoking tobacco can be harmful to your health. Tobacco contains a poisonous (toxic), colorless chemical called nicotine. Nicotine is addictive. It changes the brain and can make it hard to stop smoking. Tobacco also has other toxic chemicals that can hurt your body and raise your risk of many cancers. How can smoking tobacco affect me? Smoking tobacco puts you at risk for:  Cancer. Smoking is most commonly associated with lung cancer, but can also lead to cancer in other parts of the body.  Chronic obstructive pulmonary disease (COPD). This is a long-term lung condition that makes it hard to breathe. It also gets worse over time.  High blood pressure (hypertension), heart disease, stroke, or heart attack.  Lung infections, such as pneumonia.  Cataracts. This is when the lenses in the eyes become clouded.  Digestive problems. This may include peptic ulcers, heartburn, and gastroesophageal reflux disease (GERD).  Oral health problems, such as gum disease and tooth loss.  Loss of taste and smell. Smoking can affect your appearance by causing:  Wrinkles.  Yellow or stained teeth, fingers, and fingernails. Smoking tobacco can also affect your social life, because:  It may be challenging to find places to smoke when away from home. Many workplaces, [employer], hotels, and public places are tobacco-free.  Smoking is expensive. This is due to the cost of tobacco and the long-term costs of treating health problems from smoking.  Secondhand smoke may affect those around you. Secondhand smoke can cause lung cancer, breathing problems, and heart disease. Children of smokers have a higher risk for: ? Sudden infant death syndrome (SIDS). ? Ear infections. ? Lung infections.  If you currently smoke tobacco, quitting now can help you:  Lead a longer and healthier life.  Look, smell, breathe, and feel better over time.  Save money.  Protect others from the harms of secondhand smoke. What actions can I take to prevent health problems? Quit smoking   Do not start smoking. Quit if you already do.  Make a plan to quit smoking and commit to it. Look for programs to help you and ask your health care provider for recommendations and ideas.  Set a date and write down all the reasons you want to quit.  Let your friends and family know you are quitting so they can help and support you. Consider finding friends who also want to quit. It can be easier to quit with someone else, so that you can support each other.  Talk with your health care provider about using nicotine replacement medicines to help you quit, such as gum, lozenges, patches, sprays, or pills.  Do not replace cigarette smoking with electronic cigarettes, which are commonly called e-cigarettes. The safety of e-cigarettes is not known, and some may contain harmful chemicals.  If you try to quit but return to smoking, stay positive. It is common to slip up when you first quit, so take it one day at a time.  Be prepared for cravings. When you feel the urge to smoke, chew gum or suck on hard candy. Lifestyle  Stay busy and take care of your body.  Drink enough fluid to keep your urine pale yellow.  Get plenty of exercise and eat a healthy diet. This can help prevent weight gain after quitting.  Monitor your eating habits. Quitting smoking  can cause you to have a larger appetite than when you smoke.  Find ways to relax. Go out with friends or family to a movie or a restaurant where people do not smoke.  Ask your health care provider about having regular tests (screenings) to check for cancer. This may include blood tests, imaging tests, and other tests.  Find ways to manage your stress, such as  meditation, yoga, or exercise. Where to find support To get support to quit smoking, consider:  Asking your health care provider for more information and resources.  Taking classes to learn more about quitting smoking.  Looking for local organizations that offer resources about quitting smoking.  Joining a support group for people who want to quit smoking in your local community.  Calling the smokefree.gov counselor helpline: 1-800-Quit-Now (630) 862-2214) Where to find more information You may find more information about quitting smoking from:  HelpGuide.org: www.helpguide.org  https://hall.com/: smokefree.gov  American Lung Association: www.lung.org Contact a health care provider if you:  Have problems breathing.  Notice that your lips, nose, or fingers turn blue.  Have chest pain.  Are coughing up blood.  Feel faint or you pass out.  Have other health changes that cause you to worry. Summary  Smoking tobacco can negatively affect your health, the health of those around you, your finances, and your social life.  Do not start smoking. Quit if you already do. If you need help quitting, ask your health care provider.  Think about joining a support group for people who want to quit smoking in your local community. There are many effective programs that will help you to quit this behavior. This information is not intended to replace advice given to you by your health care provider. Make sure you discuss any questions you have with your health care provider. Document Released: 09/01/2016 Document Revised: 10/06/2017 Document Reviewed: 09/01/2016 Elsevier Interactive Patient Education  2019 [employer].

## 2018-11-11 NOTE — Progress Notes (Signed)
Subjective:   PATIENT ID: Alexa Orozco GENDER: female DOB: [birth year], MRN: [id number]   HPI  Chief Complaint  Patient presents with  . Consult    COPD - PCP sent her for recurrent bronchitis    Reason for Visit: New consult for recurrent bronchitis  [name] with CAD, HTN and hypothyroidism who presents as new consult for chronic bronchitis.  PCP note from 08/11/18 reviewed: Starting having worsening URI symptoms in November however has increased productive cough and shortness of breath. Intermittently takes Symbicort. Was treated with nebulizer in clinic with improvement. Rx zpack and bronchodilators.  Today in clinic, she unchanged symptoms of dyspnea on exertion associated with productive cough and wheezing x 4 months. Cough has improved but not to baseline. She is still perform activities however will have shortness of breath with moderate or heavy exertion. Rest and her inhalers improve her symptoms. She has been compliant with her Symbicort. Not sure how often uses albuterol inhaler but will do some puffs when she is short of breath.  Social History: Active smoker 18 pack-years.  Environmental exposures:  None  I have personally reviewed patient's past medical/family/social history, allergies, current medications.  Past Medical History:  Diagnosis Date  . Allergies   . Allergy   . Angina pectoris, unspecified ([place])   . Asthma   . CAD S/P percutaneous coronary angioplasty February 2007   Class III Angina --> Cath: 85% -- PCI 3.85mm x 90mm (4.0 mm) Cypher DES to RCA; Myoview October 2014: normal stress test: LOW RISK; mild anteroapical breast attenuation  . Cataract    h/o bilat repair  . Depression   . Depression 11/29/2013  . Dyslipidemia   . Emphysema of lung ([place])   . GERD (gastroesophageal reflux disease)   . History of palpitations   . History of tobacco abuse   . Hyperlipidemia   . Hypertension    well-controlled  . Hypothyroidism   . Osteoporosis   .  Raynaud disease      Family History  Problem Relation Age of Onset  . Liver cancer Mother   . Heart attack Mother   . Heart Problems Sister   . Lupus Sister   . Liver cancer Brother   . Asthma Brother   . Breast cancer Neg Hx      Social History   Occupational History  . Occupation: Retired  Tobacco Use  . Smoking status: Current Every Day Smoker    Packs/day: 0.50    Years: 35.00    Pack years: 17.50    Types: Cigarettes  . Smokeless tobacco: Never Used  . Tobacco comment: 11/11/18 still at 1/2ppd  Substance and Sexual Activity  . Alcohol use: No  . Drug use: No  . Sexual activity: Not on file    Allergies  Allergen Reactions  . Bystolic [Nebivolol Hcl] Hives  . Cephalexin Hives  . Levaquin [Levofloxacin In D5w] Other (See Comments)    Caused face to turn red and patient became extremely hot      Outpatient Medications Prior to Visit  Medication Sig Dispense Refill  . albuterol (PROVENTIL HFA;VENTOLIN HFA) 108 (90 Base) MCG/ACT inhaler Inhale 2 puffs into the lungs every 4 (four) hours as needed for wheezing or shortness of breath. 1 Inhaler 0  . albuterol (PROVENTIL) (2.5 MG/3ML) 0.083% nebulizer solution Take 3 mLs (2.5 mg total) by nebulization every 6 (six) hours as needed for wheezing or shortness of breath. 150 mL 1  . atorvastatin (LIPITOR) 40  MG tablet TAKE 1 TABLET BY MOUTH EVERY DAY 90 tablet 3  . clonazePAM (KLONOPIN) 0.5 MG tablet TAKE 1 TABLET BY MOUTH TWICE DAILY AS NEEDED FOR ANXIETY 30 tablet 2  . clopidogrel (PLAVIX) 75 MG tablet TAKE 1 TABLET BY MOUTH EVERY DAY NEEDS APPT FOR REFILLS 30 tablet 11  . cromolyn (OPTICROM) 4 % ophthalmic solution Place 1 drop into both eyes 4 (four) times daily.    . Eszopiclone 3 MG TABS Take 1 tablet (3 mg total) by mouth at bedtime as needed. Take immediately before bedtime 30 tablet 5  . levothyroxine (SYNTHROID, LEVOTHROID) 88 MCG tablet Take 1 tablet (88 mcg total) by mouth daily. 90 tablet 0  . meclizine  (ANTIVERT) 25 MG tablet Take 1 tablet (25 mg total) by mouth 3 (three) times daily as needed for dizziness. 30 tablet 0  . montelukast (SINGULAIR) 10 MG tablet Take 1 tablet (10 mg total) by mouth at bedtime. 30 tablet 3  . nitroGLYCERIN (NITROSTAT) 0.4 MG SL tablet Place 1 tablet (0.4 mg total) under the tongue every 5 (five) minutes as needed for chest pain. 25 tablet 6  . pantoprazole (PROTONIX) 40 MG tablet TAKE 1 TABLET BY MOUTH EVERY DAY 90 tablet 0  . PARoxetine (PAXIL) 40 MG tablet TAKE 1 TABLET BY MOUTH EVERY DAY IN THE MORNING 90 tablet 1  . azithromycin (ZITHROMAX) 250 MG tablet Take 2 tabs (500 mg) PO q d for 1d, then take 1 tab (250 mg) PO q d for day 2-5 6 each 0  . benzonatate (TESSALON) 100 MG capsule Take 1 capsule (100 mg total) by mouth 3 (three) times daily as needed for cough. 30 capsule 0  . budesonide-formoterol (SYMBICORT) 160-4.5 MCG/ACT inhaler Inhale 2 puffs into the lungs 2 (two) times daily. 1 Inhaler 3  . estradiol (ESTRACE) 1 MG tablet TAKE 1 TABLET BY MOUTH EVERY DAY 90 tablet 0   No facility-administered medications prior to visit.     Review of Systems  Constitutional: Negative for chills, diaphoresis, fever, malaise/fatigue and weight loss.  HENT: Negative for congestion, ear pain and sore throat.   Respiratory: Positive for cough, shortness of breath and wheezing. Negative for hemoptysis and sputum production.   Cardiovascular: Negative for chest pain, palpitations and leg swelling.  Gastrointestinal: Negative for abdominal pain, heartburn and nausea.  Genitourinary: Negative for frequency.  Musculoskeletal: Positive for joint pain. Negative for myalgias.  Skin: Negative for itching and rash.  Neurological: Positive for headaches. Negative for dizziness and weakness.  Endo/Heme/Allergies: Bruises/bleeds easily.  Psychiatric/Behavioral: Negative for depression. The patient is nervous/anxious.      Objective:   Vitals:   11/11/18 1116  BP: 122/78   Pulse: (!) 52  SpO2: 100%  Weight: 180 lb 12.8 oz (82 kg)  Height: 5\' 1"  (1.549 m)   SpO2: 100 %  Physical Exam: General: Well-appearing, no acute distress HENT: [place], AT, OP clear, MMM Eyes: EOMI, no scleral icterus Respiratory: Diminished breath sounds bilaterally. Clear to auscultation bilaterally.  No crackles, wheezing or rales Cardiovascular: RRR, -M/R/G, no JVD GI: BS+, soft, nontender Extremities:-Edema,-tenderness Neuro: AAO x4, CNII-XII grossly intact Skin: Intact, no rashes or bruising Psych: Normal mood, normal affect  Data Reviewed:  Imaging: CXR 08/29/18 - Hyperinflated lung fields. No infiltrate, edema or effusion.  PFT: None on file  Labs: CBC with Diff and CMP on 08/11/18 reviewed. Normal including Hg and Cr.  Imaging, labs and tests noted above have been reviewed independently by me.  Assessment & Plan:   Discussion: 73 year old female active smoker with cough, shortness of breath and wheezing. No formal PFTs on file. Suspect underlying obstructive lung defect. Will obtain PFTs for further evaluation however will go ahead and optimize bronchodilator regimen. Not in acute exacerbation.  Chronic Bronchitis -CONTINUE Symbicort 160-4.5 mcg 2 puffs twice daily. Refilled -START Spiriva 2.5 mcg 2 puffs daily -CONTINUE Albuterol nebulizer as needed for shortness of breath or wheezing -Will order pulmonary function test for further evaluation  Health Maintenance Pneumonia - Prevnar 2015, Pneumovax 2013 Influenza Due CT Lung Screen DNQ  Orders Placed This Encounter  Procedures  . Pulmonary Function Test    Standing Status:   Future    Standing Expiration Date:   11/11/2019    Scheduling Instructions:     As soon as possible 1-2 weeks    Order Specific Question:   Where should this test be performed?    Answer:   [facility]    Order Specific Question:   Full PFT: includes the following: basic spirometry, spirometry pre & post bronchodilator,  diffusion capacity (DLCO), lung volumes    Answer:   Full PFT    Order Specific Question:   MIP/MEP    Answer:   No    Order Specific Question:   6 minute walk    Answer:   No    Order Specific Question:   ABG    Answer:   No    Order Specific Question:   Diffusion capacity (DLCO)    Answer:   Yes    Order Specific Question:   Lung volumes    Answer:   Yes    Order Specific Question:   Methacholine challenge    Answer:   No   Meds ordered this encounter  Medications  . Tiotropium Bromide Monohydrate (SPIRIVA RESPIMAT) 2.5 MCG/ACT AERS    Sig: Inhale 2 puffs into the lungs daily.    Dispense:  1 Inhaler    Refill:  3  . Tiotropium Bromide Monohydrate (SPIRIVA RESPIMAT) 2.5 MCG/ACT AERS    Sig: Inhale 2 puffs into the lungs daily for 1 day.    Dispense:  1 Inhaler    Refill:  0    Order Specific Question:   Lot Number?    Answer:   [id number]    Order Specific Question:   Expiration Date?    Answer:   11/10/2020    Order Specific Question:   Quantity    Answer:   1  . budesonide-formoterol (SYMBICORT) 160-4.5 MCG/ACT inhaler    Sig: Inhale 2 puffs into the lungs 2 (two) times daily.    Dispense:  1 Inhaler    Refill:  3    Return in about 3 months (around 02/11/2019).  Fox Chase, MD [facility] 11/23/2018 2:53 PM  Office Number [phone]

## 2018-12-10 ENCOUNTER — Other Ambulatory Visit: Payer: Self-pay | Admitting: Family Medicine

## 2018-12-13 ENCOUNTER — Ambulatory Visit: Payer: Medicare Other

## 2018-12-22 ENCOUNTER — Other Ambulatory Visit: Payer: Self-pay | Admitting: Family Medicine

## 2018-12-22 MED ORDER — ESTRADIOL 1 MG PO TABS: 1.0000 mg | ORAL_TABLET | Freq: Every day | ORAL | 0 refills | Status: DC

## 2018-12-27 ENCOUNTER — Telehealth: Payer: Self-pay

## 2018-12-27 NOTE — Telephone Encounter (Signed)
Pt called stating that [facility] is out of Singulair and they do not know when they will get it in due to being on backlog. Pt wants to know if you can send her in a different allergy med. Please advise.

## 2018-12-27 NOTE — Telephone Encounter (Signed)
Can you please call around to other pharmacies and see if anyone else has singulair.   There is no alternative in the same class, so there are no other options to replace.  Please ask Ms. Haring to be sure to be on a daily antihistamine (zyrtec, claritin, allergra or xyzal daily).  If you find singulair at another pharmacy please Rx 10 mg tab, take 1 PO daily, disp #30 with 2 refills

## 2018-12-27 NOTE — Telephone Encounter (Signed)
Rx sent to [facility] in [place].

## 2018-12-30 ENCOUNTER — Other Ambulatory Visit: Payer: Self-pay | Admitting: Family Medicine

## 2019-01-04 ENCOUNTER — Other Ambulatory Visit: Payer: Self-pay | Admitting: Family Medicine

## 2019-01-04 DIAGNOSIS — F411 Generalized anxiety disorder: Secondary | ICD-10-CM

## 2019-01-04 MED ORDER — PAROXETINE HCL 40 MG PO TABS: 40.0000 mg | ORAL_TABLET | Freq: Every day | ORAL | 1 refills | Status: DC

## 2019-01-11 ENCOUNTER — Ambulatory Visit: Payer: Medicare Other | Admitting: Pulmonary Disease

## 2019-01-16 ENCOUNTER — Other Ambulatory Visit: Payer: Self-pay | Admitting: Family Medicine

## 2019-01-27 ENCOUNTER — Other Ambulatory Visit: Payer: Self-pay | Admitting: Family Medicine

## 2019-01-27 DIAGNOSIS — J302 Other seasonal allergic rhinitis: Secondary | ICD-10-CM

## 2019-01-27 DIAGNOSIS — J441 Chronic obstructive pulmonary disease with (acute) exacerbation: Secondary | ICD-10-CM

## 2019-02-04 ENCOUNTER — Other Ambulatory Visit: Payer: Self-pay | Admitting: Pulmonary Disease

## 2019-02-07 ENCOUNTER — Ambulatory Visit: Payer: Medicare Other | Admitting: Family Medicine

## 2019-02-08 ENCOUNTER — Encounter: Payer: Self-pay | Admitting: Family Medicine

## 2019-02-08 ENCOUNTER — Ambulatory Visit (INDEPENDENT_AMBULATORY_CARE_PROVIDER_SITE_OTHER): Payer: Medicare Other | Admitting: Family Medicine

## 2019-02-08 ENCOUNTER — Other Ambulatory Visit: Payer: Self-pay

## 2019-02-08 VITALS — BP 120/72 | HR 56 | Temp 97.7°F | Resp 18 | Ht 61.0 in | Wt 184.0 lb

## 2019-02-08 DIAGNOSIS — E669 Obesity, unspecified: Secondary | ICD-10-CM

## 2019-02-08 DIAGNOSIS — I10 Essential (primary) hypertension: Secondary | ICD-10-CM

## 2019-02-08 DIAGNOSIS — E039 Hypothyroidism, unspecified: Secondary | ICD-10-CM

## 2019-02-08 DIAGNOSIS — R635 Abnormal weight gain: Secondary | ICD-10-CM | POA: Diagnosis not present

## 2019-02-08 DIAGNOSIS — M810 Age-related osteoporosis without current pathological fracture: Secondary | ICD-10-CM | POA: Diagnosis not present

## 2019-02-08 DIAGNOSIS — D649 Anemia, unspecified: Secondary | ICD-10-CM | POA: Diagnosis not present

## 2019-02-08 DIAGNOSIS — E785 Hyperlipidemia, unspecified: Secondary | ICD-10-CM

## 2019-02-08 DIAGNOSIS — R0600 Dyspnea, unspecified: Secondary | ICD-10-CM

## 2019-02-08 DIAGNOSIS — R6889 Other general symptoms and signs: Secondary | ICD-10-CM | POA: Diagnosis not present

## 2019-02-08 DIAGNOSIS — R5383 Other fatigue: Secondary | ICD-10-CM | POA: Diagnosis not present

## 2019-02-08 DIAGNOSIS — F411 Generalized anxiety disorder: Secondary | ICD-10-CM

## 2019-02-08 DIAGNOSIS — D539 Nutritional anemia, unspecified: Secondary | ICD-10-CM | POA: Diagnosis not present

## 2019-02-08 MED ORDER — CLONAZEPAM 0.5 MG PO TABS: 0.2500 mg | ORAL_TABLET | Freq: Two times a day (BID) | ORAL | 1 refills | Status: DC | PRN
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
## 2019-02-08 NOTE — Progress Notes (Signed)
Ambulatory the pt around the clinic 3 times for pulse/02.  Lap #1 67 and 97%  Lap #2 72 and 97%  Lap #3 70 and 97%

## 2019-02-09 LAB — CBC WITH DIFFERENTIAL/PLATELET
Absolute Monocytes: 644 cells/uL (ref 200–950)
Basophils Absolute: 39 cells/uL (ref 0–200)
Basophils Relative: 0.6 %
Eosinophils Absolute: 254 cells/uL (ref 15–500)
Eosinophils Relative: 3.9 %
HCT: 40.3 % (ref 35.0–45.0)
Hemoglobin: 13.5 g/dL (ref 11.7–15.5)
Lymphs Abs: 2106 cells/uL (ref 850–3900)
MCH: 32.1 pg (ref 27.0–33.0)
MCHC: 33.5 g/dL (ref 32.0–36.0)
MCV: 95.7 fL (ref 80.0–100.0)
MPV: 10.3 fL (ref 7.5–12.5)
Monocytes Relative: 9.9 %
Neutro Abs: 3458 cells/uL (ref 1500–7800)
Neutrophils Relative %: 53.2 %
Platelets: 318 10*3/uL (ref 140–400)
RBC: 4.21 10*6/uL (ref 3.80–5.10)
RDW: 12.7 % (ref 11.0–15.0)
Total Lymphocyte: 32.4 %
WBC: 6.5 10*3/uL (ref 3.8–10.8)

## 2019-02-09 LAB — COMPREHENSIVE METABOLIC PANEL
AG Ratio: 1.4 (calc) (ref 1.0–2.5)
ALT: 16 U/L (ref 6–29)
AST: 17 U/L (ref 10–35)
Albumin: 4 g/dL (ref 3.6–5.1)
Alkaline phosphatase (APISO): 87 U/L (ref 37–153)
BUN/Creatinine Ratio: 14 (calc) (ref 6–22)
BUN: 13 mg/dL (ref 7–25)
CO2: 27 mmol/L (ref 20–32)
Calcium: 9.6 mg/dL (ref 8.6–10.4)
Chloride: 107 mmol/L (ref 98–110)
Creat: 0.94 mg/dL — ABNORMAL HIGH (ref 0.60–0.93)
Globulin: 2.8 g/dL (calc) (ref 1.9–3.7)
Glucose, Bld: 94 mg/dL (ref 65–99)
Potassium: 5.3 mmol/L (ref 3.5–5.3)
Sodium: 142 mmol/L (ref 135–146)
Total Bilirubin: 0.5 mg/dL (ref 0.2–1.2)
Total Protein: 6.8 g/dL (ref 6.1–8.1)

## 2019-02-09 LAB — IRON,TIBC AND FERRITIN PANEL
%SAT: 23 % (calc) (ref 16–45)
Ferritin: 31 ng/mL (ref 16–288)
Iron: 79 ug/dL (ref 45–160)
TIBC: 344 mcg/dL (calc) (ref 250–450)

## 2019-02-09 LAB — VITAMIN D 25 HYDROXY (VIT D DEFICIENCY, FRACTURES): Vit D, 25-Hydroxy: 19 ng/mL — ABNORMAL LOW (ref 30–100)

## 2019-02-09 LAB — LIPID PANEL
Cholesterol: 158 mg/dL (ref <200)
HDL: 76 mg/dL (ref >=50)
LDL Cholesterol (Calc): 68 mg/dL (calc)
Non-HDL Cholesterol (Calc): 82 mg/dL (calc) (ref <130)
Total CHOL/HDL Ratio: 2.1 (calc) (ref <5.0)
Triglycerides: 67 mg/dL (ref <150)

## 2019-02-09 LAB — B12 AND FOLATE PANEL
Folate: 11.3 ng/mL
Vitamin B-12: 743 pg/mL (ref 200–1100)

## 2019-02-09 LAB — BRAIN NATRIURETIC PEPTIDE: Brain Natriuretic Peptide: 32 pg/mL (ref <100)

## 2019-02-09 LAB — TSH: TSH: 1.35 mIU/L (ref 0.40–4.50)

## 2019-02-09 MED ORDER — VITAMIN D (ERGOCALCIFEROL) 1.25 MG (50000 UNIT) PO CAPS: 50000.0000 [IU] | ORAL_CAPSULE | ORAL | 0 refills | Status: AC

## 2019-02-09 NOTE — Addendum Note (Signed)
Addended by: Delsa Grana on: 02/09/2019 06:10 PM   Modules accepted: Orders

## 2019-02-10 NOTE — Progress Notes (Signed)
(  BNP is also normal, that was to check there was no heart failure causing fluid weight gain and fatigue)

## 2019-02-20 ENCOUNTER — Ambulatory Visit: Payer: Medicare Other

## 2019-02-25 ENCOUNTER — Other Ambulatory Visit: Payer: Self-pay | Admitting: Pulmonary Disease

## 2019-02-25 DIAGNOSIS — J441 Chronic obstructive pulmonary disease with (acute) exacerbation: Secondary | ICD-10-CM

## 2019-03-14 ENCOUNTER — Telehealth: Payer: Self-pay

## 2019-03-14 NOTE — Telephone Encounter (Signed)
Pt called to report that she is having fatigue and dizziness while taking the plaxil and would like to know if she can be switched to Wellbutrin. Please advise.

## 2019-03-15 NOTE — Telephone Encounter (Signed)
Please notify pt that at her last visit she was asked to come back for a separate OV to discuss this and med changes.  She's been on the same meds for years and with her other medical conditions I need to examine her to make sure sx are not something else.   If her mood/depression/anxiety is still as bad as it was a month or so ago, we can refer her to psychiatry too and can do that without an OV.

## 2019-03-16 ENCOUNTER — Other Ambulatory Visit: Payer: Self-pay

## 2019-03-16 NOTE — Telephone Encounter (Signed)
Spoke with patient and informed her that Kristeen Miss recommends an office visit to follow up on medications before making any medication changes. Patient verbalized understanding and office visit for tomorrow was scheduled.

## 2019-03-17 ENCOUNTER — Ambulatory Visit (INDEPENDENT_AMBULATORY_CARE_PROVIDER_SITE_OTHER): Payer: Medicare Other | Admitting: Family Medicine

## 2019-03-17 VITALS — BP 130/72 | HR 58 | Temp 97.7°F | Resp 18 | Ht 61.0 in | Wt 183.6 lb

## 2019-03-17 DIAGNOSIS — F339 Major depressive disorder, recurrent, unspecified: Secondary | ICD-10-CM | POA: Diagnosis not present

## 2019-03-17 DIAGNOSIS — F411 Generalized anxiety disorder: Secondary | ICD-10-CM | POA: Diagnosis not present

## 2019-03-17 DIAGNOSIS — M79644 Pain in right finger(s): Secondary | ICD-10-CM

## 2019-03-17 MED ORDER — FLUOXETINE HCL 20 MG PO TABS: 20.0000 mg | ORAL_TABLET | Freq: Every day | ORAL | 3 refills | Status: DC

## 2019-03-17 NOTE — Patient Instructions (Addendum)
Get and start the prozac today, can take at bedtime or in the am. We don't want you to withdrawal from your meds, so start with the 20 mg for 1-2 weeks and see how you feel.    I will refer you to the hand specialist for your right middle finger  Get out of your house outdoors and get some sunshine and exercise.  Follow up with Dr. Dennard Schaumann in 3-6 weeks to recheck your meds

## 2019-03-17 NOTE — Progress Notes (Signed)
Patient ID: Alexa Orozco, female    DOB: 11-13-1945, 73 y.o.   MRN: [id number]  PCP: Delsa Grana, PA-C  Chief Complaint  Patient presents with  . Medication Management    follow up    Subjective:   Alexa Orozco is a 73 y.o. female, presents to clinic with CC of follow-up on mood depression and requesting med changes.  She states that she has stopped her Paxil herself because she believes it is making her gain weight and making her feel tired.  She has been on the same dose of Paxil for over a year.  As previously discussed in recent visit she has had increased stressors, significantly decreased activity level and has also gained weight.  Many of her symptoms coincided with increased stressors in her life and with COVID quarantine.  She states she is sleeping about 2 to 4 hours at night and also taking naps during the day.  She is continue to do this even though she had stopped Zoloft.  She continues to be a inactive hiding in her home most the time, she used to be active going outside, watching kids in the neighborhood.  Now she states that her kitchen table is in her bedroom a lot and tries to avoid her son and her grandson.  She previously said she was going to move out and that would solve a lot of her problems but now states that her son is going to be arrested and that we will get him out of the house. She comes in asking to try Wellbutrin because she read that it will help her to lose weight.  She has not tried to go outside and do things she used to do.  She has not worked on her diet.    She has been Rx'd klonopin by her past PCP and I have refilled it for her with increased anxiety and stress, she still endorses that she takes occasionally and not every day.    Patient Active Problem List   Diagnosis Date Noted  . Insomnia 09/19/2014  . Generalized anxiety disorder 09/19/2014  . COPD exacerbation ([place]) 08/20/2014  . Depression 11/29/2013  . Osteoporosis   . Obesity (BMI 30-39.9)  05/31/2013  . Stable angina ([place]) 05/31/2013  . Claudication of left lower extremity ([place]) 05/31/2013  . CAD S/P percutaneous coronary angioplasty   . Dyslipidemia   . Hirsutism 03/03/2011  . Essential hypertension   . Hypothyroid   . Raynaud's disease   . COPD (chronic obstructive pulmonary disease) ([place])   . Tobacco abuse      Prior to Admission medications   Medication Sig Start Date End Date Taking? Authorizing Provider  albuterol (PROVENTIL HFA;VENTOLIN HFA) 108 (90 Base) MCG/ACT inhaler Inhale 2 puffs into the lungs every 4 (four) hours as needed for wheezing or shortness of breath. 08/11/18  Yes Delsa Grana, PA-C  albuterol (PROVENTIL) (2.5 MG/3ML) 0.083% nebulizer solution Take 3 mLs (2.5 mg total) by nebulization every 6 (six) hours as needed for wheezing or shortness of breath. 01/18/18  Yes Delsa Grana, PA-C  atorvastatin (LIPITOR) 40 MG tablet TAKE 1 TABLET BY MOUTH EVERY DAY 05/13/18  Yes Leonie Man, MD  clonazePAM (KLONOPIN) 0.5 MG tablet Take 0.5-1 tablets (0.25-0.5 mg total) by mouth 2 (two) times daily as needed for anxiety. 02/08/19  Yes Delsa Grana, PA-C  clopidogrel (PLAVIX) 75 MG tablet TAKE 1 TABLET BY MOUTH EVERY DAY NEEDS APPT FOR REFILLS 12/30/18  Yes [place], Modena Nunnery, MD  cromolyn (OPTICROM) 4 % ophthalmic solution Place 1 drop into both eyes 4 (four) times daily. 06/16/16  Yes [provider]  Eszopiclone 3 MG TABS Take 1 tablet (3 mg total) by mouth at bedtime as needed. Take immediately before bedtime 09/20/18  Yes Rhianna Raulerson, Kristeen Miss, PA-C  levothyroxine (SYNTHROID, LEVOTHROID) 88 MCG tablet TAKE 1 TABLET BY MOUTH EVERY DAY 12/12/18  Yes [place], Modena Nunnery, MD  meclizine (ANTIVERT) 25 MG tablet Take 1 tablet (25 mg total) by mouth 3 (three) times daily as needed for dizziness. 10/18/17  Yes [place], Modena Nunnery, MD  montelukast (SINGULAIR) 10 MG tablet TAKE 1 TABLET BY MOUTH EVERYDAY AT BEDTIME 01/27/19  Yes Delsa Grana, PA-C  nitroGLYCERIN (NITROSTAT) 0.4 MG  SL tablet Place 1 tablet (0.4 mg total) under the tongue every 5 (five) minutes as needed for chest pain. 03/10/18  Yes Dena Billet B, PA-C  pantoprazole (PROTONIX) 40 MG tablet TAKE 1 TABLET BY MOUTH EVERY DAY 01/16/19  Yes Delsa Grana, PA-C  SPIRIVA RESPIMAT 2.5 MCG/ACT AERS INHALE 2 PUFFS BY MOUTH INTO THE LUNGS DAILY 02/06/19  Yes Margaretha Seeds, MD  SYMBICORT 160-4.5 MCG/ACT inhaler TAKE 2 PUFFS BY MOUTH TWICE A DAY 02/27/19  Yes Margaretha Seeds, MD  Tiotropium Bromide Monohydrate (SPIRIVA RESPIMAT) 2.5 MCG/ACT AERS Inhale 2 puffs into the lungs daily for 1 day. 11/11/18 03/17/19 Yes Margaretha Seeds, MD  Vitamin D, Ergocalciferol, (DRISDOL) 1.25 MG (50000 UT) CAPS capsule Take 1 capsule (50,000 Units total) by mouth every 7 (seven) days for 12 doses. x12 weeks. 02/09/19 04/28/19 Yes Delsa Grana, PA-C  PARoxetine (PAXIL) 40 MG tablet Take 1 tablet (40 mg total) by mouth daily. Patient not taking: Reported on 03/17/2019 01/04/19   Susy Frizzle, MD     Allergies  Allergen Reactions  . Bystolic [Nebivolol Hcl] Hives  . Cephalexin Hives  . Levaquin [Levofloxacin In D5w] Other (See Comments)    Caused face to turn red and patient became extremely hot      Family History  Problem Relation Age of Onset  . Liver cancer Mother   . Heart attack Mother   . Heart Problems Sister   . Lupus Sister   . Liver cancer Brother   . Asthma Brother   . Breast cancer Neg Hx      Social History   Socioeconomic History  . Marital status: Widowed    Spouse name: Not on file  . Number of children: 3  . Years of education: 10th   . Highest education level: Not on file  Occupational History  . Occupation: Retired  Scientific laboratory technician  . Financial resource strain: Not on file  . Food insecurity    Worry: Not on file    Inability: Not on file  . Transportation needs    Medical: Not on file    Non-medical: Not on file  Tobacco Use  . Smoking status: Current Every Day Smoker    Packs/day: 0.50     Years: 35.00    Pack years: 17.50    Types: Cigarettes  . Smokeless tobacco: Never Used  . Tobacco comment: 11/11/18 still at 1/2ppd  Substance and Sexual Activity  . Alcohol use: No  . Drug use: No  . Sexual activity: Not on file  Lifestyle  . Physical activity    Days per week: Not on file    Minutes per session: Not on file  . Stress: Not on file  Relationships  . Social connections  Talks on phone: Not on file    Gets together: Not on file    Attends religious service: Not on file    Active member of club or organization: Not on file    Attends meetings of clubs or organizations: Not on file    Relationship status: Not on file  . Intimate partner violence    Fear of current or ex partner: Not on file    Emotionally abused: Not on file    Physically abused: Not on file    Forced sexual activity: Not on file  Other Topics Concern  . Not on file  Social History Narrative   She is a widowed mother of 24, grandmother for, great-grandmother 12.  She still smokes about half pack a day.  She doesn't have about 35 years.  She does not drink.   Prior to her symptoms coming on, she used to do all kind of walking around and working in the garden and other activities with her close friend.     Review of Systems  Constitutional: Negative.   HENT: Negative.   Eyes: Negative.   Respiratory: Negative.   Cardiovascular: Negative.   Gastrointestinal: Negative.   Endocrine: Negative.   Genitourinary: Negative.   Musculoskeletal: Negative.   Skin: Negative.   Allergic/Immunologic: Negative.   Neurological: Negative.   Hematological: Negative.   Psychiatric/Behavioral: Negative.   All other systems reviewed and are negative.      Objective:    Vitals:   03/17/19 1159  BP: 130/72  Pulse: (!) 58  Resp: 18  Temp: 97.7 F (36.5 C)  SpO2: 95%  Weight: 183 lb 9.6 oz (83.3 kg)  Height: 5\' 1"  (1.549 m)      Physical Exam Vitals signs and nursing note reviewed.   Constitutional:      General: She is not in acute distress.    Appearance: She is well-developed. She is obese. She is not ill-appearing, toxic-appearing or diaphoretic.  HENT:     Head: Normocephalic and atraumatic.     Nose: Nose normal.  Eyes:     General:        Right eye: No discharge.        Left eye: No discharge.     Conjunctiva/sclera: Conjunctivae normal.  Neck:     Trachea: No tracheal deviation.  Cardiovascular:     Rate and Rhythm: Normal rate and regular rhythm.  Pulmonary:     Effort: Pulmonary effort is normal. No respiratory distress.     Breath sounds: No stridor.  Musculoskeletal: Normal range of motion.  Skin:    General: Skin is warm and dry.     Findings: No rash.  Neurological:     Mental Status: She is alert.     Motor: No abnormal muscle tone.     Coordination: Coordination normal.  Psychiatric:        Attention and Perception: Attention normal.        Mood and Affect: Mood is depressed. Mood is not anxious. Affect is not labile or tearful.        Speech: Speech normal.        Behavior: Behavior normal. Behavior is cooperative.        Thought Content: Thought content normal. Thought content is not paranoid or delusional. Thought content does not include suicidal ideation. Thought content does not include homicidal or suicidal plan.        Cognition and Memory: Cognition normal.  Judgment: Judgment normal.           Assessment & Plan:      ICD-10-CM   1. Episode of recurrent major depressive disorder, unspecified depression episode severity (HCC)  F33.9 FLUoxetine (PROZAC) 20 MG tablet  2. Pain of right middle finger  M79.644 Ambulatory referral to Hand Surgery  3. Generalized anxiety disorder  F41.1 FLUoxetine (PROZAC) 20 MG tablet    73 year old female presents requesting change to her anxiety and depression medications.  She has been on same medication since before I became her PCP about 7 or 8 months ago after her PCP left the  practice.  She had been on Paxil since about February 2018, prior to that she was on Cymbalta, there is also Zoloft, Viibryd, trintellix, celexa and wellbutrin prescribed in the past.  She has seemed well up until COVID quarantine which unfortunately coincided with some other increased stressors in her life and with her family members which have been previously documented.  We will try to transition to Prozac because of its side effect profile, less weight gain however we have discussed previously that her sitting inside her home doing nothing all day not going outside, and eating much more than before is causing weight gain and any medications that she has been on for several years may be less likely to blame.  I did encourage her to find some and talk to them but she has told me at each subsequent visit that there is a solution of just around the corner however things do not seem to be getting better for her.  Once she transitions from Paxil to Prozac would like her to follow-up and other med adjustments may be discussed at that time.  She is currently not homicidal or suicidal, follow-up arranged with Dr. Dennard Schaumann for transition of care.  Pt also has right middle finger catching, swelling and popping and she would like to see specialist, prior xrays negative.       Delsa Grana, PA-C 03/17/19 12:15 PM

## 2019-03-21 ENCOUNTER — Other Ambulatory Visit: Payer: Self-pay | Admitting: Family Medicine

## 2019-03-22 ENCOUNTER — Encounter: Payer: Self-pay | Admitting: Family Medicine

## 2019-03-24 ENCOUNTER — Other Ambulatory Visit: Payer: Self-pay

## 2019-03-24 MED ORDER — ESZOPICLONE 3 MG PO TABS: 3.0000 mg | ORAL_TABLET | Freq: Every evening | ORAL | 5 refills | Status: DC | PRN

## 2019-03-24 NOTE — Telephone Encounter (Signed)
Requested Prescriptions   Pending Prescriptions Disp Refills  . Eszopiclone 3 MG TABS 30 tablet 5    Sig: Take 1 tablet (3 mg total) by mouth at bedtime as needed. Take immediately before bedtime    Last OV 03/17/2019  Last written 09/20/2018

## 2019-03-25 ENCOUNTER — Other Ambulatory Visit: Payer: Self-pay | Admitting: Family Medicine

## 2019-03-29 ENCOUNTER — Telehealth: Payer: Self-pay

## 2019-03-29 NOTE — Telephone Encounter (Signed)
She has lunesta and klonopin - both should knock her out and any other prescriptions would be unsafe. She should do a follow up visit with new PCP to address.   Or please make sure that she knows these meds are for sleep and anxiety and are sedating  thanks

## 2019-03-29 NOTE — Telephone Encounter (Signed)
Pt Alexa Orozco wanting an Rx for sleep aid. Please advise.

## 2019-03-29 NOTE — Telephone Encounter (Signed)
Pt notified. Verbalizes understanding. Alexa Orozco was refilled by [facility] on 03/24/2019 and pt was unaware because she had no more pills.

## 2019-04-10 ENCOUNTER — Other Ambulatory Visit: Payer: Self-pay | Admitting: Family Medicine

## 2019-04-10 ENCOUNTER — Other Ambulatory Visit: Payer: Self-pay | Admitting: Cardiology

## 2019-04-10 DIAGNOSIS — F411 Generalized anxiety disorder: Secondary | ICD-10-CM

## 2019-04-10 DIAGNOSIS — F339 Major depressive disorder, recurrent, unspecified: Secondary | ICD-10-CM

## 2019-04-10 NOTE — Telephone Encounter (Signed)
Pt overdue for 12 month f/u. Please contact pt for future appointment. Pt needing refills. 

## 2019-04-12 ENCOUNTER — Ambulatory Visit (INDEPENDENT_AMBULATORY_CARE_PROVIDER_SITE_OTHER): Payer: Medicare Other | Admitting: Family Medicine

## 2019-04-12 ENCOUNTER — Encounter: Payer: Self-pay | Admitting: Family Medicine

## 2019-04-12 ENCOUNTER — Other Ambulatory Visit: Payer: Self-pay

## 2019-04-12 VITALS — BP 110/70 | HR 56 | Temp 98.6°F | Resp 12 | Ht 61.0 in | Wt 184.0 lb

## 2019-04-12 DIAGNOSIS — F339 Major depressive disorder, recurrent, unspecified: Secondary | ICD-10-CM

## 2019-04-12 MED ORDER — VENLAFAXINE HCL ER 75 MG PO CP24: 150.0000 mg | ORAL_CAPSULE | Freq: Every day | ORAL | 3 refills | Status: DC

## 2019-04-12 NOTE — Progress Notes (Signed)
Subjective:    Patient ID: Alexa Orozco, female    DOB: 1945-10-10, 73 y.o.   MRN: [id number]  HPI  Patient is here today for follow-up of her last visit.  At that time she was switched from Paxil to Prozac.  She started Paxil approximately 1 year ago per her report for depression.  The Paxil was not helping and she thought that it was causing weight gain and so at the last visit my partner switch her to Prozac.  Over the last month she has seen no benefit since switching to Prozac.  She reports feeling depressed on a daily basis.  She reports anhedonia.  She reports hypersomnia.  She has a difficult time getting out of bed.  She has very little motivation.  She reports no energy.  She denies any suicidal ideation.  Her depression stems from the situation dealing with her grandson who is addicted to drugs and almost overdosed over the weekend.  She has had to kick him out of her house.  She had to have him arrested.  He is blaming her.  The stress of the situation is weighing heavily on her. Past Medical History:  Diagnosis Date  . Allergies   . Allergy   . Angina pectoris, unspecified ([place])   . Asthma   . CAD S/P percutaneous coronary angioplasty February 2007   Class III Angina --> Cath: 85% -- PCI 3.29mm x 12mm (4.0 mm) Cypher DES to RCA; Myoview October 2014: normal stress test: LOW RISK; mild anteroapical breast attenuation  . Cataract    h/o bilat repair  . Claudication ([place]) 06/01/2013  . Depression   . Depression 11/29/2013  . Dyslipidemia   . Emphysema of lung ([place])   . GERD (gastroesophageal reflux disease)   . History of palpitations   . History of tobacco abuse   . Hyperlipidemia   . Hypertension    well-controlled  . Hypothyroidism   . Osteoporosis   . Raynaud disease    Past Surgical History:  Procedure Laterality Date  . ABDOMINAL HYSTERECTOMY     h/o partila hysterectomy  . BREAST BIOPSY  1988  . CORONARY ANGIOPLASTY WITH STENT PLACEMENT  10/14/2005   cypher DES  (3.67mmx18mm) to high grade RCA lesion  . NM MYOVIEW LTD  July 2012 of October 2014   '12: dipyridamole; Normal, low risk study; 2014 - normal stress test: LOW RISK; mild anteroapical breast attenuation  . TONSILLECTOMY  1959  . TRANSTHORACIC ECHOCARDIOGRAM  05/22/2009   EF=>55%, normal LV systolic function; normal RV systolic function; mild MR/TR   Current Outpatient Medications on File Prior to Visit  Medication Sig Dispense Refill  . albuterol (PROVENTIL HFA;VENTOLIN HFA) 108 (90 Base) MCG/ACT inhaler Inhale 2 puffs into the lungs every 4 (four) hours as needed for wheezing or shortness of breath. 1 Inhaler 0  . albuterol (PROVENTIL) (2.5 MG/3ML) 0.083% nebulizer solution Take 3 mLs (2.5 mg total) by nebulization every 6 (six) hours as needed for wheezing or shortness of breath. 150 mL 1  . atorvastatin (LIPITOR) 40 MG tablet TAKE 1 TABLET BY MOUTH EVERY DAY 90 tablet 3  . clonazePAM (KLONOPIN) 0.5 MG tablet Take 0.5-1 tablets (0.25-0.5 mg total) by mouth 2 (two) times daily as needed for anxiety. 60 tablet 1  . clopidogrel (PLAVIX) 75 MG tablet TAKE 1 TABLET BY MOUTH EVERY DAY 90 tablet 2  . cromolyn (OPTICROM) 4 % ophthalmic solution Place 1 drop into both eyes 4 (four) times daily.    Marland Kitchen  Eszopiclone 3 MG TABS Take 1 tablet (3 mg total) by mouth at bedtime as needed. Take immediately before bedtime 30 tablet 5  . FLUoxetine (PROZAC) 20 MG tablet TAKE 1 TABLET BY MOUTH EVERY DAY 90 tablet 2  . levothyroxine (SYNTHROID) 88 MCG tablet TAKE 1 TABLET BY MOUTH EVERY DAY 90 tablet 3  . montelukast (SINGULAIR) 10 MG tablet TAKE 1 TABLET BY MOUTH EVERYDAY AT BEDTIME 90 tablet 1  . nitroGLYCERIN (NITROSTAT) 0.4 MG SL tablet Place 1 tablet (0.4 mg total) under the tongue every 5 (five) minutes as needed for chest pain. 25 tablet 6  . pantoprazole (PROTONIX) 40 MG tablet TAKE 1 TABLET BY MOUTH EVERY DAY 90 tablet 1  . SPIRIVA RESPIMAT 2.5 MCG/ACT AERS INHALE 2 PUFFS BY MOUTH INTO THE LUNGS DAILY 4 g 0  .  SYMBICORT 160-4.5 MCG/ACT inhaler TAKE 2 PUFFS BY MOUTH TWICE A DAY 30.6 Inhaler 1  . Vitamin D, Ergocalciferol, (DRISDOL) 1.25 MG (50000 UT) CAPS capsule Take 1 capsule (50,000 Units total) by mouth every 7 (seven) days for 12 doses. x12 weeks. 12 capsule 0  . Tiotropium Bromide Monohydrate (SPIRIVA RESPIMAT) 2.5 MCG/ACT AERS Inhale 2 puffs into the lungs daily for 1 day. 1 Inhaler 0   No current facility-administered medications on file prior to visit.    Allergies  Allergen Reactions  . Bystolic [Nebivolol Hcl] Hives  . Cephalexin Hives  . Levaquin [Levofloxacin In D5w] Other (See Comments)    Caused face to turn red and patient became extremely hot    Social History   Socioeconomic History  . Marital status: Widowed    Spouse name: Not on file  . Number of children: 3  . Years of education: 10th   . Highest education level: Not on file  Occupational History  . Occupation: Retired  Scientific laboratory technician  . Financial resource strain: Not on file  . Food insecurity    Worry: Not on file    Inability: Not on file  . Transportation needs    Medical: Not on file    Non-medical: Not on file  Tobacco Use  . Smoking status: Current Every Day Smoker    Packs/day: 0.50    Years: 35.00    Pack years: 17.50    Types: Cigarettes  . Smokeless tobacco: Never Used  . Tobacco comment: 11/11/18 still at 1/2ppd  Substance and Sexual Activity  . Alcohol use: No  . Drug use: No  . Sexual activity: Not on file  Lifestyle  . Physical activity    Days per week: Not on file    Minutes per session: Not on file  . Stress: Not on file  Relationships  . Social Herbalist on phone: Not on file    Gets together: Not on file    Attends religious service: Not on file    Active member of club or organization: Not on file    Attends meetings of clubs or organizations: Not on file    Relationship status: Not on file  . Intimate partner violence    Fear of current or ex partner: Not on file     Emotionally abused: Not on file    Physically abused: Not on file    Forced sexual activity: Not on file  Other Topics Concern  . Not on file  Social History Narrative   She is a widowed mother of 65, grandmother for, great-grandmother 78.  She still smokes about half pack a day.  She doesn't have about 35 years.  She does not drink.   Prior to her symptoms coming on, she used to do all kind of walking around and working in the garden and other activities with her close friend.    Review of Systems  All other systems reviewed and are negative.      Objective:   Physical Exam Vitals signs reviewed.  Constitutional:      Appearance: Normal appearance. She is normal weight.  Cardiovascular:     Rate and Rhythm: Normal rate and regular rhythm.     Heart sounds: Normal heart sounds.  Pulmonary:     Effort: Pulmonary effort is normal.     Breath sounds: Normal breath sounds.  Neurological:     Mental Status: She is alert.  Psychiatric:        Attention and Perception: Attention and perception normal.        Mood and Affect: Mood is depressed.        Speech: Speech normal.        Behavior: Behavior normal.        Cognition and Memory: Cognition and memory normal.           Assessment & Plan:  The encounter diagnosis was Episode of recurrent major depressive disorder, unspecified depression episode severity ([place]). Discontinue Prozac.  Switch to Effexor XR 75 mg p.o. every morning and then increase to 150 mg p.o. every morning in 1 week.  Reassess in 1 month.

## 2019-04-19 DIAGNOSIS — M65331 Trigger finger, right middle finger: Secondary | ICD-10-CM | POA: Diagnosis not present

## 2019-04-19 DIAGNOSIS — M79644 Pain in right finger(s): Secondary | ICD-10-CM | POA: Diagnosis not present

## 2019-04-24 ENCOUNTER — Telehealth: Payer: Self-pay | Admitting: Family Medicine

## 2019-04-24 NOTE — Telephone Encounter (Signed)
Pt called and states that the new mediation you gave her is making her face red and hot and she stopped it and would like to know if you put her on something else?

## 2019-04-25 NOTE — Telephone Encounter (Signed)
Try cymbalta 60 mg poqday and remove effexor.

## 2019-04-25 NOTE — Telephone Encounter (Signed)
Called and spoke to pt and she states that she just come off Cymbalta and it did not work. Can we try something else?

## 2019-04-25 NOTE — Telephone Encounter (Signed)
Trintellix 10 mg poqday

## 2019-04-26 ENCOUNTER — Other Ambulatory Visit: Payer: Self-pay | Admitting: Family Medicine

## 2019-04-26 MED ORDER — VORTIOXETINE HBR 10 MG PO TABS: 10.0000 mg | ORAL_TABLET | Freq: Every day | ORAL | 3 refills | Status: DC

## 2019-04-26 NOTE — Telephone Encounter (Signed)
Patient aware of providers recommendations and med sent to pharm 

## 2019-04-26 NOTE — Telephone Encounter (Signed)
Ok to refill??  Last office visit 03/17/2019.  Last refill 03/24/2019.

## 2019-04-28 NOTE — Telephone Encounter (Signed)
Fax received from pharm stating that Trintellix is $80 co pay and pt would like something cheaper?

## 2019-05-01 NOTE — Telephone Encounter (Signed)
Switch to zoloft 50 mg poqhs

## 2019-05-02 MED ORDER — SERTRALINE HCL 50 MG PO TABS: 50.0000 mg | ORAL_TABLET | Freq: Every day | ORAL | 3 refills | Status: DC

## 2019-05-02 NOTE — Addendum Note (Signed)
Addended by: Shary Decamp B on: 05/02/2019 02:32 PM   Modules accepted: Orders

## 2019-05-02 NOTE — Telephone Encounter (Signed)
Pt aware and med sent to pharm 

## 2019-05-10 ENCOUNTER — Other Ambulatory Visit: Payer: Self-pay | Admitting: Family Medicine

## 2019-05-10 MED ORDER — SERTRALINE HCL 50 MG PO TABS: 50.0000 mg | ORAL_TABLET | Freq: Every day | ORAL | 3 refills | Status: DC

## 2019-05-25 ENCOUNTER — Other Ambulatory Visit: Payer: Self-pay | Admitting: Family Medicine

## 2019-05-25 NOTE — Telephone Encounter (Signed)
Last office visit: 04/12/2019 Last refilled: 04/27/2019

## 2019-06-26 ENCOUNTER — Other Ambulatory Visit: Payer: Self-pay | Admitting: Family Medicine

## 2019-06-26 NOTE — Telephone Encounter (Signed)
Ok to refill??  Last office visit 04/12/2019.  Last refill 05/25/2019.

## 2019-07-10 ENCOUNTER — Other Ambulatory Visit: Payer: Self-pay | Admitting: Cardiology

## 2019-07-20 ENCOUNTER — Other Ambulatory Visit: Payer: Self-pay | Admitting: Cardiology

## 2019-07-20 ENCOUNTER — Other Ambulatory Visit: Payer: Self-pay | Admitting: Family Medicine

## 2019-07-24 ENCOUNTER — Other Ambulatory Visit: Payer: Self-pay | Admitting: Family Medicine

## 2019-07-24 NOTE — Telephone Encounter (Signed)
Ok to refill??  Last office visit 04/12/2019.  Last refill 06/26/2019.

## 2019-07-26 ENCOUNTER — Other Ambulatory Visit: Payer: Self-pay

## 2019-08-01 ENCOUNTER — Telehealth: Payer: Self-pay | Admitting: Family Medicine

## 2019-08-01 ENCOUNTER — Ambulatory Visit (INDEPENDENT_AMBULATORY_CARE_PROVIDER_SITE_OTHER): Payer: Medicare Other | Admitting: Family Medicine

## 2019-08-01 ENCOUNTER — Other Ambulatory Visit: Payer: Self-pay

## 2019-08-01 ENCOUNTER — Encounter: Payer: Self-pay | Admitting: Family Medicine

## 2019-08-01 VITALS — BP 128/70 | HR 66 | Temp 96.6°F | Resp 18 | Ht 61.0 in | Wt 176.0 lb

## 2019-08-01 DIAGNOSIS — M65311 Trigger thumb, right thumb: Secondary | ICD-10-CM | POA: Diagnosis not present

## 2019-08-01 DIAGNOSIS — F339 Major depressive disorder, recurrent, unspecified: Secondary | ICD-10-CM | POA: Diagnosis not present

## 2019-08-01 MED ORDER — SERTRALINE HCL 100 MG PO TABS: 100.0000 mg | ORAL_TABLET | Freq: Every day | ORAL | 3 refills | Status: DC

## 2019-08-01 NOTE — Telephone Encounter (Signed)
Patient calling to say when she saw dr pickard today, he thought she was on zoloft, she is NOT on zoloft, it is prozac. She said he was going to call her in a higher dosage  [phone] if any questions

## 2019-08-01 NOTE — Telephone Encounter (Signed)
See previous phone note.  

## 2019-08-01 NOTE — Telephone Encounter (Signed)
Called pt and she is on 50mg  of Zoloft nor Prozac

## 2019-08-01 NOTE — Telephone Encounter (Signed)
Patient states that she looked at the bottle incorrectly for her zoloft she is currently on 50 mg and she states that Dr. Dennard Schaumann was going to increase zoloft at first she said prozac but on that bottle it stated to stop prozac.  She uses [facility] on [address]  CB# [phone]

## 2019-08-01 NOTE — Progress Notes (Signed)
Subjective:    Patient ID: Alexa Orozco, female    DOB: March 13, 1946, 73 y.o.   MRN: [id number]  HPI 04/12/19 Patient is here today for follow-up of her last visit.  At that time she was switched from Paxil to Prozac.  She started Paxil approximately 1 year ago per her report for depression.  The Paxil was not helping and she thought that it was causing weight gain and so at the last visit my partner switch her to Prozac.  Over the last month she has seen no benefit since switching to Prozac.  She reports feeling depressed on a daily basis.  She reports anhedonia.  She reports hypersomnia.  She has a difficult time getting out of bed.  She has very little motivation.  She reports no energy.  She denies any suicidal ideation.  Her depression stems from the situation dealing with her grandson who is addicted to drugs and almost overdosed over the weekend.  She has had to kick him out of her house.  She had to have him arrested.  He is blaming her.  The stress of the situation is weighing heavily on her.  At that time, my plan was: Discontinue Prozac.  Switch to Effexor XR 75 mg p.o. every morning and then increase to 150 mg p.o. every morning in 1 week.  Reassess in 1 month.  08/01/19 Patient was unable to tolerate Effexor.  Ultimately we tried the patient on Trintellix however she was unable to afford Trintellix.  Therefore I switch the patient to Zoloft 50 mg p.o. nightly.  Patient has seen no benefit since switching to Zoloft.  She has no perceived benefit compared to Prozac.  She continues to have very little motivation and poor energy.  She reports anhedonia.  She reports sadness.  She denies suicidal ideation.  She also complains of pain in her right thumb at the base of the first MCP joint.  There is a palpable nodule there and the patient has a trigger finger with flexion of the first interphalangeal joint. Past Medical History:  Diagnosis Date  . Allergies   . Allergy   . Angina pectoris, unspecified  ([place])   . Asthma   . CAD S/P percutaneous coronary angioplasty February 2007   Class III Angina --> Cath: 85% -- PCI 3.78mm x 74mm (4.0 mm) Cypher DES to RCA; Myoview October 2014: normal stress test: LOW RISK; mild anteroapical breast attenuation  . Cataract    h/o bilat repair  . Claudication ([place]) 06/01/2013  . Depression   . Depression 11/29/2013  . Dyslipidemia   . Emphysema of lung ([place])   . GERD (gastroesophageal reflux disease)   . History of palpitations   . History of tobacco abuse   . Hyperlipidemia   . Hypertension    well-controlled  . Hypothyroidism   . Osteoporosis   . Raynaud disease    Past Surgical History:  Procedure Laterality Date  . ABDOMINAL HYSTERECTOMY     h/o partila hysterectomy  . BREAST BIOPSY  1988  . CORONARY ANGIOPLASTY WITH STENT PLACEMENT  10/14/2005   cypher DES (3.44mmx18mm) to high grade RCA lesion  . NM MYOVIEW LTD  July 2012 of October 2014   '12: dipyridamole; Normal, low risk study; 2014 - normal stress test: LOW RISK; mild anteroapical breast attenuation  . TONSILLECTOMY  1959  . TRANSTHORACIC ECHOCARDIOGRAM  05/22/2009   EF=>55%, normal LV systolic function; normal RV systolic function; mild MR/TR   Current Outpatient Medications on  File Prior to Visit  Medication Sig Dispense Refill  . atorvastatin (LIPITOR) 40 MG tablet Take 1 tablet (40 mg total) by mouth daily. NEED OV. 15 tablet 0  . clonazePAM (KLONOPIN) 0.5 MG tablet Take 0.5-1 tablets (0.25-0.5 mg total) by mouth 2 (two) times daily as needed for anxiety. 60 tablet 1  . clopidogrel (PLAVIX) 75 MG tablet TAKE 1 TABLET BY MOUTH EVERY DAY 90 tablet 2  . cromolyn (OPTICROM) 4 % ophthalmic solution Place 1 drop into both eyes 4 (four) times daily.    . Eszopiclone 3 MG TABS TAKE 1 TABLET BY MOUTH AT BEDTIME AS NEEDED. TAKE IMMEDIATELY BEFORE BEDTIME (Patient taking differently: Lunesta) 30 tablet 0  . levothyroxine (SYNTHROID) 88 MCG tablet TAKE 1 TABLET BY MOUTH EVERY DAY 90 tablet 3   . montelukast (SINGULAIR) 10 MG tablet TAKE 1 TABLET BY MOUTH EVERYDAY AT BEDTIME 90 tablet 1  . nitroGLYCERIN (NITROSTAT) 0.4 MG SL tablet Place 1 tablet (0.4 mg total) under the tongue every 5 (five) minutes as needed for chest pain. 25 tablet 6  . pantoprazole (PROTONIX) 40 MG tablet TAKE 1 TABLET BY MOUTH EVERY DAY 90 tablet 1  . SPIRIVA RESPIMAT 2.5 MCG/ACT AERS INHALE 2 PUFFS BY MOUTH INTO THE LUNGS DAILY 4 g 0  . SYMBICORT 160-4.5 MCG/ACT inhaler TAKE 2 PUFFS BY MOUTH TWICE A DAY 30.6 Inhaler 1  . albuterol (PROVENTIL HFA;VENTOLIN HFA) 108 (90 Base) MCG/ACT inhaler Inhale 2 puffs into the lungs every 4 (four) hours as needed for wheezing or shortness of breath. 1 Inhaler 0  . albuterol (PROVENTIL) (2.5 MG/3ML) 0.083% nebulizer solution Take 3 mLs (2.5 mg total) by nebulization every 6 (six) hours as needed for wheezing or shortness of breath. 150 mL 1   No current facility-administered medications on file prior to visit.    Allergies  Allergen Reactions  . Bystolic [Nebivolol Hcl] Hives  . Cephalexin Hives  . Effexor [Venlafaxine] Other (See Comments)    Made her face hot and red  . Levaquin [Levofloxacin In D5w] Other (See Comments)    Caused face to turn red and patient became extremely hot    Social History   Socioeconomic History  . Marital status: Widowed    Spouse name: Not on file  . Number of children: 3  . Years of education: 10th   . Highest education level: Not on file  Occupational History  . Occupation: Retired  Scientific laboratory technician  . Financial resource strain: Not on file  . Food insecurity    Worry: Not on file    Inability: Not on file  . Transportation needs    Medical: Not on file    Non-medical: Not on file  Tobacco Use  . Smoking status: Current Every Day Smoker    Packs/day: 0.50    Years: 35.00    Pack years: 17.50    Types: Cigarettes  . Smokeless tobacco: Never Used  . Tobacco comment: 11/11/18 still at 1/2ppd  Substance and Sexual Activity  .  Alcohol use: No  . Drug use: No  . Sexual activity: Not on file  Lifestyle  . Physical activity    Days per week: Not on file    Minutes per session: Not on file  . Stress: Not on file  Relationships  . Social Herbalist on phone: Not on file    Gets together: Not on file    Attends religious service: Not on file    Active member  of club or organization: Not on file    Attends meetings of clubs or organizations: Not on file    Relationship status: Not on file  . Intimate partner violence    Fear of current or ex partner: Not on file    Emotionally abused: Not on file    Physically abused: Not on file    Forced sexual activity: Not on file  Other Topics Concern  . Not on file  Social History Narrative   She is a widowed mother of 35, grandmother for, great-grandmother 63.  She still smokes about half pack a day.  She doesn't have about 35 years.  She does not drink.   Prior to her symptoms coming on, she used to do all kind of walking around and working in the garden and other activities with her close friend.    Review of Systems  All other systems reviewed and are negative.      Objective:   Physical Exam Vitals signs reviewed.  Constitutional:      Appearance: Normal appearance. She is normal weight.  Cardiovascular:     Rate and Rhythm: Normal rate and regular rhythm.     Heart sounds: Normal heart sounds.  Pulmonary:     Effort: Pulmonary effort is normal.     Breath sounds: Normal breath sounds.  Neurological:     Mental Status: She is alert.  Psychiatric:        Attention and Perception: Attention and perception normal.        Mood and Affect: Mood is depressed.        Speech: Speech normal.        Behavior: Behavior normal.        Cognition and Memory: Cognition and memory normal.           Assessment & Plan:  Episode of recurrent major depressive disorder, unspecified depression episode severity (HCC)  Trigger thumb of right hand   Increase Zoloft to 100 mg p.o. nightly and reassess via telephone in 4 to 6 weeks.  I offer the patient a cortisone injection for the trigger finger at her right thumb but the patient politely declined as the symptoms are not bothering her significantly.

## 2019-08-08 ENCOUNTER — Telehealth: Payer: Self-pay | Admitting: Family Medicine

## 2019-08-08 ENCOUNTER — Other Ambulatory Visit: Payer: Self-pay | Admitting: Family Medicine

## 2019-08-08 MED ORDER — PERMETHRIN 5 % EX CREA: 1.0000 "application " | TOPICAL_CREAM | Freq: Once | CUTANEOUS | 0 refills | Status: DC

## 2019-08-08 NOTE — Telephone Encounter (Signed)
Patient can use elimite apply once and rinse off after 8 hours.

## 2019-08-08 NOTE — Telephone Encounter (Signed)
Pt called and states that her grandson was at her house and was itching. Later she developed the same itching and rash. Thinks she has scabies and wanted to know if you would call her in something for that or does she NTBS?

## 2019-08-08 NOTE — Telephone Encounter (Signed)
Pt aware.

## 2019-08-18 ENCOUNTER — Other Ambulatory Visit: Payer: Self-pay | Admitting: Cardiology

## 2019-08-20 ENCOUNTER — Other Ambulatory Visit: Payer: Self-pay | Admitting: Family Medicine

## 2019-08-21 NOTE — Telephone Encounter (Signed)
Ok to refill??  Last office visit 08/01/2019.  Last refill 07/24/2019.  Ok to add refills to prescription?

## 2019-08-23 ENCOUNTER — Other Ambulatory Visit: Payer: Self-pay | Admitting: Family Medicine

## 2019-08-23 DIAGNOSIS — J441 Chronic obstructive pulmonary disease with (acute) exacerbation: Secondary | ICD-10-CM

## 2019-08-23 MED ORDER — BUDESONIDE-FORMOTEROL FUMARATE 160-4.5 MCG/ACT IN AERO: INHALATION_SPRAY | RESPIRATORY_TRACT | 1 refills | Status: DC

## 2019-08-24 ENCOUNTER — Other Ambulatory Visit: Payer: Self-pay | Admitting: Family Medicine

## 2019-08-31 ENCOUNTER — Other Ambulatory Visit: Payer: Self-pay

## 2019-08-31 ENCOUNTER — Encounter: Payer: Self-pay | Admitting: Family Medicine

## 2019-08-31 ENCOUNTER — Other Ambulatory Visit: Payer: Self-pay | Admitting: Cardiology

## 2019-08-31 ENCOUNTER — Ambulatory Visit (INDEPENDENT_AMBULATORY_CARE_PROVIDER_SITE_OTHER): Payer: Medicare Other | Admitting: Family Medicine

## 2019-08-31 VITALS — BP 132/68 | HR 60 | Temp 98.7°F | Resp 14 | Ht 61.0 in | Wt 159.0 lb

## 2019-08-31 DIAGNOSIS — M65311 Trigger thumb, right thumb: Secondary | ICD-10-CM | POA: Diagnosis not present

## 2019-08-31 DIAGNOSIS — J441 Chronic obstructive pulmonary disease with (acute) exacerbation: Secondary | ICD-10-CM

## 2019-08-31 MED ORDER — SPIRIVA RESPIMAT 2.5 MCG/ACT IN AERS: INHALATION_SPRAY | RESPIRATORY_TRACT | 11 refills | Status: DC

## 2019-08-31 MED ORDER — ALBUTEROL SULFATE HFA 108 (90 BASE) MCG/ACT IN AERS: 2.0000 | INHALATION_SPRAY | RESPIRATORY_TRACT | 11 refills | Status: DC | PRN

## 2019-08-31 NOTE — Addendum Note (Signed)
Addended by: Sheral Flow on: 08/31/2019 02:29 PM   Modules accepted: Orders

## 2019-08-31 NOTE — Progress Notes (Signed)
Subjective:    Patient ID: Alexa Orozco, female    DOB: [birth year], 73 y.o.   MRN: [id number]  Patient reports pain in her right thumb.  Pain is primarily on the palmar surface at the right first MCP joint.  At that area she has a palpable nodule.  As she flexes and extends her MCP and PIP joint, there is a palpable locking sensation and a popping sensation over the palmar surface of the first MCP joint consistent with a trigger thumb.  She is requesting a cortisone injection in that area today Past Medical History:  Diagnosis Date  . Allergies   . Allergy   . Angina pectoris, unspecified ([place])   . Asthma   . CAD S/P percutaneous coronary angioplasty February 2007   Class III Angina --> Cath: 85% -- PCI 3.12mm x 17mm (4.0 mm) Cypher DES to RCA; Myoview October 2014: normal stress test: LOW RISK; mild anteroapical breast attenuation  . Cataract    h/o bilat repair  . Claudication ([place]) 06/01/2013  . Depression   . Depression 11/29/2013  . Dyslipidemia   . Emphysema of lung ([place])   . GERD (gastroesophageal reflux disease)   . History of palpitations   . History of tobacco abuse   . Hyperlipidemia   . Hypertension    well-controlled  . Hypothyroidism   . Osteoporosis   . Raynaud disease    Past Surgical History:  Procedure Laterality Date  . ABDOMINAL HYSTERECTOMY     h/o partila hysterectomy  . BREAST BIOPSY  1988  . CORONARY ANGIOPLASTY WITH STENT PLACEMENT  10/14/2005   cypher DES (3.50mmx18mm) to high grade RCA lesion  . NM MYOVIEW LTD  July 2012 of October 2014   '12: dipyridamole; Normal, low risk study; 2014 - normal stress test: LOW RISK; mild anteroapical breast attenuation  . TONSILLECTOMY  1959  . TRANSTHORACIC ECHOCARDIOGRAM  05/22/2009   EF=>55%, normal LV systolic function; normal RV systolic function; mild MR/TR   Current Outpatient Medications on File Prior to Visit  Medication Sig Dispense Refill  . albuterol (PROVENTIL HFA;VENTOLIN HFA) 108 (90 Base) MCG/ACT inhaler  Inhale 2 puffs into the lungs every 4 (four) hours as needed for wheezing or shortness of breath. 1 Inhaler 0  . albuterol (PROVENTIL) (2.5 MG/3ML) 0.083% nebulizer solution Take 3 mLs (2.5 mg total) by nebulization every 6 (six) hours as needed for wheezing or shortness of breath. 150 mL 1  . atorvastatin (LIPITOR) 40 MG tablet Take 1 tablet (40 mg total) by mouth daily. NEED OV. 15 tablet 0  . budesonide-formoterol (SYMBICORT) 160-4.5 MCG/ACT inhaler TAKE 2 PUFFS BY MOUTH TWICE A DAY 10.2 Inhaler 1  . clonazePAM (KLONOPIN) 0.5 MG tablet Take 0.5-1 tablets (0.25-0.5 mg total) by mouth 2 (two) times daily as needed for anxiety. 60 tablet 1  . clopidogrel (PLAVIX) 75 MG tablet TAKE 1 TABLET BY MOUTH EVERY DAY 90 tablet 2  . cromolyn (OPTICROM) 4 % ophthalmic solution Place 1 drop into both eyes 4 (four) times daily.    . Eszopiclone 3 MG TABS TAKE 1 TABLET BY MOUTH AT BEDTIME AS NEEDED. TAKE IMMEDIATELY BEFORE BEDTIME 30 tablet 3  . levothyroxine (SYNTHROID) 88 MCG tablet TAKE 1 TABLET BY MOUTH EVERY DAY 90 tablet 3  . montelukast (SINGULAIR) 10 MG tablet TAKE 1 TABLET BY MOUTH EVERYDAY AT BEDTIME 90 tablet 1  . nitroGLYCERIN (NITROSTAT) 0.4 MG SL tablet Place 1 tablet (0.4 mg total) under the tongue every 5 (five) minutes as  needed for chest pain. 25 tablet 6  . pantoprazole (PROTONIX) 40 MG tablet TAKE 1 TABLET BY MOUTH EVERY DAY 90 tablet 1  . sertraline (ZOLOFT) 100 MG tablet TAKE 1 TABLET BY MOUTH EVERY DAY 90 tablet 2  . SPIRIVA RESPIMAT 2.5 MCG/ACT AERS INHALE 2 PUFFS BY MOUTH INTO THE LUNGS DAILY 4 g 0   No current facility-administered medications on file prior to visit.   Allergies  Allergen Reactions  . Bystolic [Nebivolol Hcl] Hives  . Cephalexin Hives  . Effexor [Venlafaxine] Other (See Comments)    Made her face hot and red  . Levaquin [Levofloxacin In D5w] Other (See Comments)    Caused face to turn red and patient became extremely hot    Social History   Socioeconomic  History  . Marital status: Widowed    Spouse name: Not on file  . Number of children: 3  . Years of education: 10th   . Highest education level: Not on file  Occupational History  . Occupation: Retired  Tobacco Use  . Smoking status: Current Every Day Smoker    Packs/day: 0.50    Years: 35.00    Pack years: 17.50    Types: Cigarettes  . Smokeless tobacco: Never Used  . Tobacco comment: 11/11/18 still at 1/2ppd  Substance and Sexual Activity  . Alcohol use: No  . Drug use: No  . Sexual activity: Not on file  Other Topics Concern  . Not on file  Social History Narrative   She is a widowed mother of 61, grandmother for, great-grandmother 60.  She still smokes about half pack a day.  She doesn't have about 35 years.  She does not drink.   Prior to her symptoms coming on, she used to do all kind of walking around and working in the garden and other activities with her close friend.   Social Determinants of Health   Financial Resource Strain:   . Difficulty of Paying Living Expenses: Not on file  Food Insecurity:   . Worried About Charity fundraiser in the Last Year: Not on file  . Ran Out of Food in the Last Year: Not on file  Transportation Needs:   . Lack of Transportation (Medical): Not on file  . Lack of Transportation (Non-Medical): Not on file  Physical Activity:   . Days of Exercise per Week: Not on file  . Minutes of Exercise per Session: Not on file  Stress:   . Feeling of Stress : Not on file  Social Connections:   . Frequency of Communication with Friends and Family: Not on file  . Frequency of Social Gatherings with Friends and Family: Not on file  . Attends Religious Services: Not on file  . Active Member of Clubs or Organizations: Not on file  . Attends Archivist Meetings: Not on file  . Marital Status: Not on file  Intimate Partner Violence:   . Fear of Current or Ex-Partner: Not on file  . Emotionally Abused: Not on file  . Physically Abused: Not  on file  . Sexually Abused: Not on file    Review of Systems  All other systems reviewed and are negative.      Objective:   Physical Exam Vitals reviewed.  Constitutional:      Appearance: Normal appearance. She is normal weight.  Cardiovascular:     Rate and Rhythm: Normal rate and regular rhythm.     Heart sounds: Normal heart sounds.  Pulmonary:  Effort: Pulmonary effort is normal.     Breath sounds: Normal breath sounds.  Musculoskeletal:     Right hand: Tenderness present. Decreased range of motion. Normal strength. Normal sensation.  Neurological:     Mental Status: She is alert.  Psychiatric:        Attention and Perception: Attention and perception normal.        Speech: Speech normal.        Behavior: Behavior normal.        Cognition and Memory: Cognition and memory normal.           Assessment & Plan:  Trigger finger of right thumb  Using sterile technique, the skin was anesthetized with ethyl chloride.  A mixture of 1/2 cc of 0.1% lidocaine without epinephrine and 1/2 cc of 40 mg/mL Kenalog was injected near the inflammatory nodule at the palmar surface of the right first MCP joint.  Patient tolerated the procedure well without complication.

## 2019-09-13 ENCOUNTER — Other Ambulatory Visit: Payer: Self-pay | Admitting: Family Medicine

## 2019-09-13 MED ORDER — PRAVASTATIN SODIUM 20 MG PO TABS: 20.0000 mg | ORAL_TABLET | Freq: Every day | ORAL | 3 refills | Status: DC

## 2019-09-13 MED ORDER — SPIRIVA RESPIMAT 2.5 MCG/ACT IN AERS: INHALATION_SPRAY | RESPIRATORY_TRACT | 11 refills | Status: DC

## 2019-09-13 MED ORDER — PERMETHRIN 5 % EX CREA: 1.0000 "application " | TOPICAL_CREAM | Freq: Once | CUTANEOUS | 0 refills | Status: AC

## 2019-11-03 ENCOUNTER — Encounter: Payer: Self-pay | Admitting: General Practice

## 2019-11-28 ENCOUNTER — Telehealth: Payer: Self-pay | Admitting: Family Medicine

## 2019-11-28 NOTE — Telephone Encounter (Signed)
Pt would like to know if you would switch her from Zoloft to Wellbutrin as she heard this can help with weight loss and help her stop smoking?

## 2019-11-29 ENCOUNTER — Other Ambulatory Visit: Payer: Self-pay | Admitting: Cardiology

## 2019-11-29 ENCOUNTER — Other Ambulatory Visit: Payer: Self-pay | Admitting: Family Medicine

## 2019-11-30 ENCOUNTER — Other Ambulatory Visit: Payer: Self-pay | Admitting: Family Medicine

## 2019-11-30 MED ORDER — BUPROPION HCL ER (XL) 150 MG PO TB24: 150.0000 mg | ORAL_TABLET | Freq: Every day | ORAL | 2 refills | Status: DC

## 2019-11-30 MED ORDER — TRAMADOL HCL 50 MG PO TABS: 50.0000 mg | ORAL_TABLET | Freq: Four times a day (QID) | ORAL | 0 refills | Status: AC | PRN

## 2019-11-30 NOTE — Telephone Encounter (Signed)
Decrease zoloft to 50 poqhs for 2 weeks then 50 poqod for two weeks then stop.  I will send in wellbutrin

## 2019-11-30 NOTE — Telephone Encounter (Signed)
Pt aware.

## 2019-12-07 DIAGNOSIS — Z961 Presence of intraocular lens: Secondary | ICD-10-CM | POA: Diagnosis not present

## 2019-12-07 DIAGNOSIS — H0262 Xanthelasma of right lower eyelid: Secondary | ICD-10-CM | POA: Diagnosis not present

## 2019-12-07 DIAGNOSIS — H10413 Chronic giant papillary conjunctivitis, bilateral: Secondary | ICD-10-CM | POA: Diagnosis not present

## 2019-12-07 DIAGNOSIS — H04123 Dry eye syndrome of bilateral lacrimal glands: Secondary | ICD-10-CM | POA: Diagnosis not present

## 2019-12-07 DIAGNOSIS — H18513 Endothelial corneal dystrophy, bilateral: Secondary | ICD-10-CM | POA: Diagnosis not present

## 2019-12-07 DIAGNOSIS — H02834 Dermatochalasis of left upper eyelid: Secondary | ICD-10-CM | POA: Diagnosis not present

## 2019-12-07 DIAGNOSIS — H02831 Dermatochalasis of right upper eyelid: Secondary | ICD-10-CM | POA: Diagnosis not present

## 2019-12-23 ENCOUNTER — Other Ambulatory Visit: Payer: Self-pay | Admitting: Family Medicine

## 2019-12-23 ENCOUNTER — Other Ambulatory Visit: Payer: Self-pay | Admitting: Cardiology

## 2019-12-24 ENCOUNTER — Other Ambulatory Visit: Payer: Self-pay | Admitting: Family Medicine

## 2019-12-25 ENCOUNTER — Ambulatory Visit (INDEPENDENT_AMBULATORY_CARE_PROVIDER_SITE_OTHER): Payer: Medicare Other | Admitting: Cardiology

## 2019-12-25 ENCOUNTER — Other Ambulatory Visit: Payer: Self-pay

## 2019-12-25 ENCOUNTER — Encounter: Payer: Self-pay | Admitting: Cardiology

## 2019-12-25 VITALS — BP 125/70 | HR 51 | Temp 97.0°F | Ht 61.0 in | Wt 171.8 lb

## 2019-12-25 DIAGNOSIS — Z720 Tobacco use: Secondary | ICD-10-CM | POA: Diagnosis not present

## 2019-12-25 DIAGNOSIS — I10 Essential (primary) hypertension: Secondary | ICD-10-CM | POA: Diagnosis not present

## 2019-12-25 DIAGNOSIS — E669 Obesity, unspecified: Secondary | ICD-10-CM

## 2019-12-25 DIAGNOSIS — I2510 Atherosclerotic heart disease of native coronary artery without angina pectoris: Secondary | ICD-10-CM | POA: Diagnosis not present

## 2019-12-25 DIAGNOSIS — E785 Hyperlipidemia, unspecified: Secondary | ICD-10-CM | POA: Diagnosis not present

## 2019-12-25 DIAGNOSIS — Z9861 Coronary angioplasty status: Secondary | ICD-10-CM | POA: Diagnosis not present

## 2019-12-25 DIAGNOSIS — I7300 Raynaud's syndrome without gangrene: Secondary | ICD-10-CM

## 2019-12-25 NOTE — Progress Notes (Signed)
Primary Care Provider: Susy Frizzle, MD Cardiologist: No primary care provider on file. Electrophysiologist: None  Clinic Note: Chief Complaint  Patient presents with  . Follow-up    Almost 3-year  . Coronary Artery Disease    No angina   HPI:    Alexa Orozco is a 74 y.o. female with a PMH notable for CAD having PCI in February 2007 for class III angina, who presents today for 2-1/2-year follow-up.  Alexa Orozco was last seen on April 01, 2017.  She was doing relatively well.  Her mom most recent Myoview had been in 2013 that was nonischemic.  She noted that she has her baseline exertional dyspnea but stays relatively active "piddling around ". She notes exertional dyspnea is rushing upstairs.  I recommended continuing on clopidogrel without aspirin.  Also on statin.  No beta-blocker because of bradycardia and COPD.  Recent Hospitalizations: None  Reviewed  CV studies:    The following studies were reviewed today: (if available, images/films reviewed: From Epic Chart or Care Everywhere) . None:   Interval History:   Alexa Orozco returns here today for delayed follow-up stating that she just got out of the habit of coming into the visits.  She says she goes out walking every now and then with her daughter every now and then depending on the weather.  She has made an effort to go out with her daughter to walk the dog a couple times a week.  She started walking a couple days ago, and has been out for the last couple days and doing well.  She has her same baseline dyspnea but no real change to any exertional chest pain or pressure.  No worsening exertional dyspnea.  Cardiovascular review of Symptoms (Summary): positive for - dyspnea on exertion and Maybe a little bit of exertional fatigue negative for - chest pain, edema, irregular heartbeat, orthopnea, palpitations, paroxysmal nocturnal dyspnea, rapid heart rate, shortness of breath or Syncope/near syncope, TIA/amaurosis fugax,  claudication  The patient does not have symptoms concerning for COVID-19 infection (fever, chills, cough, or new shortness of breath).  The patient is practicing social distancing & Masking.    Has not yet gotten her Covid vaccine.  She is "not sure ".  -=> She is making a conservative effort to cut back her amount of cigarettes that she smokes today.  Down to maybe or 2.  REVIEWED OF SYSTEMS   Review of Systems  Constitutional: Positive for weight loss (She has definitely changed her diet.  To cut out a lot of the "junk ".  Has lost weight intentionally.). Negative for malaise/fatigue.  HENT: Negative for congestion and nosebleeds.   Respiratory: Positive for shortness of breath (Related to COPD.  No change).   Cardiovascular: Negative for claudication and leg swelling.  Gastrointestinal: Positive for heartburn. Negative for blood in stool.  Musculoskeletal: Positive for back pain and joint pain.       This limits her activity more than anything else.  Neurological: Negative.   Endo/Heme/Allergies: Positive for environmental allergies.  Psychiatric/Behavioral: Negative.    I have reviewed and (if needed) personally updated the patient's problem list, medications, allergies, past medical and surgical history, social and family history.   PAST MEDICAL HISTORY   Past Medical History:  Diagnosis Date  . Allergies   . Allergy   . Angina pectoris, unspecified ([place])   . Asthma   . CAD S/P percutaneous coronary angioplasty February 2007   Class III Angina --> Cath:  85% -- PCI 3.23mm x 27mm (4.0 mm) Cypher DES to RCA; Myoview October 2014: normal stress test: LOW RISK; mild anteroapical breast attenuation  . Cataract    h/o bilat repair  . Claudication ([place]) 06/01/2013  . Depression   . Depression 11/29/2013  . Dyslipidemia   . Emphysema of lung ([place])   . GERD (gastroesophageal reflux disease)   . History of palpitations   . History of tobacco abuse   . Hyperlipidemia   .  Hypertension    well-controlled  . Hypothyroidism   . Osteoporosis   . Raynaud disease     PAST SURGICAL HISTORY   Past Surgical History:  Procedure Laterality Date  . ABDOMINAL HYSTERECTOMY     h/o partila hysterectomy  . BREAST BIOPSY  1988  . CORONARY ANGIOPLASTY WITH STENT PLACEMENT  10/14/2005   cypher DES (3.33mmx18mm) to high grade RCA lesion  . NM MYOVIEW LTD  July 2012 of October 2014   '12: dipyridamole; Normal, low risk study; 2014 - normal stress test: LOW RISK; mild anteroapical breast attenuation  . TONSILLECTOMY  1959  . TRANSTHORACIC ECHOCARDIOGRAM  05/22/2009   EF=>55%, normal LV systolic function; normal RV systolic function; mild MR/TR    MEDICATIONS/ALLERGIES   Current Meds  Medication Sig  . albuterol (PROVENTIL) (2.5 MG/3ML) 0.083% nebulizer solution Take 3 mLs (2.5 mg total) by nebulization every 6 (six) hours as needed for wheezing or shortness of breath.  Marland Kitchen albuterol (VENTOLIN HFA) 108 (90 Base) MCG/ACT inhaler Inhale 2 puffs into the lungs every 4 (four) hours as needed for wheezing or shortness of breath.  . clonazePAM (KLONOPIN) 0.5 MG tablet Take 0.5-1 tablets (0.25-0.5 mg total) by mouth 2 (two) times daily as needed for anxiety.  . clopidogrel (PLAVIX) 75 MG tablet TAKE 1 TABLET BY MOUTH EVERY DAY  . cromolyn (OPTICROM) 4 % ophthalmic solution Place 1 drop into both eyes 4 (four) times daily.  Marland Kitchen levothyroxine (SYNTHROID) 88 MCG tablet TAKE 1 TABLET BY MOUTH EVERY DAY  . montelukast (SINGULAIR) 10 MG tablet TAKE 1 TABLET BY MOUTH EVERYDAY AT BEDTIME  . nitroGLYCERIN (NITROSTAT) 0.4 MG SL tablet Place 1 tablet (0.4 mg total) under the tongue every 5 (five) minutes as needed for chest pain.  . pantoprazole (PROTONIX) 40 MG tablet TAKE 1 TABLET BY MOUTH EVERY DAY  . pravastatin (PRAVACHOL) 20 MG tablet Take 1 tablet (20 mg total) by mouth daily.  . Tiotropium Bromide Monohydrate (SPIRIVA RESPIMAT) 2.5 MCG/ACT AERS INHALE 2 PUFFS BY MOUTH INTO THE LUNGS  DAILY  . [DISCONTINUED] Eszopiclone 3 MG TABS TAKE 1 TABLET BY MOUTH AT BEDTIME AS NEEDED. TAKE IMMEDIATELY BEFORE BEDTIME  . [DISCONTINUED] sertraline (ZOLOFT) 100 MG tablet TAKE 1 TABLET BY MOUTH EVERY DAY    Allergies  Allergen Reactions  . Bystolic [Nebivolol Hcl] Hives  . Cephalexin Hives  . Effexor [Venlafaxine] Other (See Comments)    Made her face hot and red  . Levaquin [Levofloxacin In D5w] Other (See Comments)    Caused face to turn red and patient became extremely hot     SOCIAL HISTORY/FAMILY HISTORY   Reviewed in Epic:  Pertinent findings: Still smoking, trying to cut back.  OBJCTIVE -PE, EKG, labs   Wt Readings from Last 3 Encounters:  12/25/19 171 lb 12.8 oz (77.9 kg)  08/31/19 159 lb (72.1 kg)  08/01/19 176 lb (79.8 kg)    Physical Exam: BP 125/70   Pulse (!) 51   Temp (!) 97 F (36.1 C)  Ht 5\' 1"  (1.549 m)   Wt 171 lb 12.8 oz (77.9 kg)   SpO2 96%   BMI 32.46 kg/m  Physical Exam  Constitutional: She is oriented to person, place, and time. She appears well-developed and well-nourished.  HENT:  Head: Normocephalic and atraumatic.  Neck: No JVD present.  Cardiovascular: Regular rhythm and normal heart sounds.  No extrasystoles are present. Bradycardia present. PMI is not displaced. Exam reveals decreased pulses (Decreased but palpable pedal pulses). Exam reveals no gallop and no friction rub.  No murmur heard. Pulmonary/Chest: Effort normal and breath sounds normal. No respiratory distress. She has no wheezes. She has no rales. She exhibits no tenderness.  Distant breath sounds  Abdominal: Soft. Bowel sounds are normal. She exhibits no distension. There is no abdominal tenderness.  Musculoskeletal:        General: Edema (Trivial) present. Normal range of motion.     Cervical back: Neck supple.  Neurological: She is alert and oriented to person, place, and time.  Psychiatric: She has a normal mood and affect. Her behavior is normal. Judgment and  thought content normal.  Vitals reviewed.   Adult ECG Report  Rate: 51 ;  Rhythm: sinus bradycardia and Normal axis, intervals and durations.;   Narrative Interpretation: Stable. --> She notices that her heart rate does go up appropriately with exercise.  Recent Labs: She is due for her labs rechecked by PCP in the next 1-2 months Lab Results  Component Value Date   CHOL 158 02/08/2019   HDL 76 02/08/2019   LDLCALC 68 02/08/2019   TRIG 67 02/08/2019   CHOLHDL 2.1 02/08/2019   Lab Results  Component Value Date   CREATININE 0.94 (H) 02/08/2019   BUN 13 02/08/2019   NA 142 02/08/2019   K 5.3 02/08/2019   CL 107 02/08/2019   CO2 27 02/08/2019   Lab Results  Component Value Date   TSH 1.35 02/08/2019    ASSESSMENT/PLAN    Problem List Items Addressed This Visit    CAD S/P percutaneous coronary angioplasty - Primary (Chronic)    Distant history of Cypher DES to the RCA.-  Plan:  Lifelong Plavix, but okay to stop for bleeding or procedures/surgeries.  (Okay to stop 5 to 7 days preop for surgeries.)  On pravastatin  No beta-blocker because of bradycardia and COPD.  Has not been on ACE inhibitor or ARB because of history of hypotension.  For now as long as blood pressure stable, will hold off on meds.      Relevant Orders   EKG 12-Lead (Completed)   Essential hypertension (Chronic)    Blood pressure is well controlled on no medications.  With having history of Raynaud's, may consider calcium channel blocker as opposed to ARB      Relevant Orders   EKG 12-Lead (Completed)   Tobacco abuse (Chronic)    Smoking cessation instruction/counseling given:  counseled patient on the dangers of tobacco use, advised patient to stop smoking, and reviewed strategies to maximize success      Dyslipidemia (Chronic)    LDL of 68 as of June last year on current dose of pravastatin.  Should be due for labs to be rechecked soon. -> Titration of pravastatin depending on results.        Obesity (BMI 30-39.9) (Chronic)    The patient understands the need to lose weight with diet and exercise. We have discussed specific strategies for this.      Raynaud's disease (Chronic)  No significant symptoms to suggest recurrent Raynaud's.  If she were to have symptoms, would probably consider using low-dose amlodipine.          COVID-19 Education: The signs and symptoms of COVID-19 were discussed with the patient and how to seek care for testing (follow up with PCP or arrange E-visit).   The importance of social distancing and COVID-19 vaccination was discussed today.  I spent a total of 7minutes with the patient. >  50% of the time was spent in direct patient consultation.  Additional time spent with chart review  / charting (studies, outside notes, etc): 8 Total Time: 26 min   Current medicines are reviewed at length with the patient today.  (+/- concerns) n/a  Notice: This dictation was prepared with Dragon dictation along with smaller phrase technology. Any transcriptional errors that result from this process are unintentional and may not be corrected upon review.  Patient Instructions / Medication Changes & Studies & Tests Ordered   Patient Instructions  Medication Instructions:  No changes *If you need a refill on your cardiac medications before your next appointment, please call your pharmacy*   Lab Work: Not needed    Testing/Procedures: Not needed   Follow-Up: At [facility], you and your health needs are our priority.  As part of our continuing mission to provide you with exceptional heart care, we have created designated Provider Care Teams.  These Care Teams include your primary Cardiologist (physician) and Advanced Practice Providers (APPs -  Physician Assistants and Nurse Practitioners) who all work together to provide you with the care you need, when you need it.  We recommend signing up for the patient portal called "MyChart".  Sign up  information is provided on this After Visit Summary.  MyChart is used to connect with patients for Virtual Visits (Telemedicine).  Patients are able to view lab/test results, encounter notes, upcoming appointments, etc.  Non-urgent messages can be sent to your provider as well.   To learn more about what you can do with MyChart, go to [email or web address].    Your next appointment:   12 month(s)  The format for your next appointment:   In Person  Provider:   Glenetta Hew, MD   Other Instructions n/a    Studies Ordered:   Orders Placed This Encounter  Procedures  . EKG 12-Lead     Glenetta Hew, M.D., M.S. Interventional Cardiologist   Pager # [phone] Phone # [phone] [address] [place], [place] [address]   Thank you for choosing [facility] at [facility]!!

## 2019-12-25 NOTE — Patient Instructions (Signed)
Medication Instructions:  No changes *If you need a refill on your cardiac medications before your next appointment, please call your pharmacy*   Lab Work: Not needed    Testing/Procedures: Not needed   Follow-Up: At [facility], you and your health needs are our priority.  As part of our continuing mission to provide you with exceptional heart care, we have created designated Provider Care Teams.  These Care Teams include your primary Cardiologist (physician) and Advanced Practice Providers (APPs -  Physician Assistants and Nurse Practitioners) who all work together to provide you with the care you need, when you need it.  We recommend signing up for the patient portal called "MyChart".  Sign up information is provided on this After Visit Summary.  MyChart is used to connect with patients for Virtual Visits (Telemedicine).  Patients are able to view lab/test results, encounter notes, upcoming appointments, etc.  Non-urgent messages can be sent to your provider as well.   To learn more about what you can do with MyChart, go to [email or web address].    Your next appointment:   12 month(s)  The format for your next appointment:   In Person  Provider:   Glenetta Hew, MD   Other Instructions n/a

## 2019-12-25 NOTE — Telephone Encounter (Signed)
Requesting refill    Eszopiclone  LOV: 08/31/2019  LRF:  08/21/2019

## 2019-12-29 ENCOUNTER — Telehealth: Payer: Self-pay | Admitting: Family Medicine

## 2019-12-29 ENCOUNTER — Other Ambulatory Visit: Payer: Self-pay | Admitting: Family Medicine

## 2019-12-29 MED ORDER — SERTRALINE HCL 100 MG PO TABS: 100.0000 mg | ORAL_TABLET | Freq: Every day | ORAL | 2 refills | Status: DC

## 2019-12-29 NOTE — Telephone Encounter (Signed)
Stop wellbutrin and resume zoloft

## 2019-12-29 NOTE — Telephone Encounter (Signed)
Pt called and left a message stating that she can not tolerate the Wellbutrin and will go back on the Zoloft.

## 2019-12-31 ENCOUNTER — Encounter: Payer: Self-pay | Admitting: Cardiology

## 2019-12-31 NOTE — Assessment & Plan Note (Signed)
LDL of 68 as of June last year on current dose of pravastatin.  Should be due for labs to be rechecked soon. -> Titration of pravastatin depending on results.

## 2019-12-31 NOTE — Assessment & Plan Note (Signed)
Smoking cessation instruction/counseling given:  counseled patient on the dangers of tobacco use, advised patient to stop smoking, and reviewed strategies to maximize success 

## 2019-12-31 NOTE — Assessment & Plan Note (Signed)
No significant symptoms to suggest recurrent Raynaud's.  If she were to have symptoms, would probably consider using low-dose amlodipine.

## 2019-12-31 NOTE — Assessment & Plan Note (Signed)
The patient understands the need to lose weight with diet and exercise. We have discussed specific strategies for this.  

## 2019-12-31 NOTE — Assessment & Plan Note (Signed)
Distant history of Cypher DES to the RCA.-  Plan:  Lifelong Plavix, but okay to stop for bleeding or procedures/surgeries.  (Okay to stop 5 to 7 days preop for surgeries.)  On pravastatin  No beta-blocker because of bradycardia and COPD.  Has not been on ACE inhibitor or ARB because of history of hypotension.  For now as long as blood pressure stable, will hold off on meds.

## 2019-12-31 NOTE — Assessment & Plan Note (Signed)
Blood pressure is well controlled on no medications.  With having history of Raynaud's, may consider calcium channel blocker as opposed to ARB

## 2020-01-01 NOTE — Telephone Encounter (Signed)
Pt aware via vm 

## 2020-02-08 ENCOUNTER — Ambulatory Visit
Admission: EM | Admit: 2020-02-08 | Discharge: 2020-02-08 | Disposition: A | Discharge status: Home / Self Care | Payer: Medicare Other | Attending: Emergency Medicine | Admitting: Emergency Medicine

## 2020-02-08 DIAGNOSIS — S60562A Insect bite (nonvenomous) of left hand, initial encounter: Secondary | ICD-10-CM

## 2020-02-08 DIAGNOSIS — L299 Pruritus, unspecified: Secondary | ICD-10-CM

## 2020-02-08 DIAGNOSIS — W57XXXA Bitten or stung by nonvenomous insect and other nonvenomous arthropods, initial encounter: Secondary | ICD-10-CM

## 2020-02-08 MED ORDER — TRIAMCINOLONE ACETONIDE 0.1 % EX CREA
1.0000 | TOPICAL_CREAM | Freq: Two times a day (BID) | CUTANEOUS | 0 refills | Status: DC
Start: 2020-02-08 — End: 2020-10-24

## 2020-02-08 NOTE — ED Triage Notes (Signed)
Pt presents with insect bite to left hand

## 2020-02-08 NOTE — Discharge Instructions (Addendum)
Prescribed triamcinolone  cream  May use calamine lotion use as needed for itchiness Avoid excessive scratching, which may put you at risk for developing a secondary bacterial infection Take OTC anti-inflammatories, like ibuprofen or motrin, as needed for pain and swelling Follow up with PCP if symptoms persist Return or go to ER if you have any new or worsening symptoms

## 2020-02-08 NOTE — ED Provider Notes (Signed)
[facility]    CSN: [id number] Arrival date & time: 02/08/20  1207      History   Chief Complaint Chief Complaint  Patient presents with  . Insect Bite    HPI Alexa Orozco is a 74 y.o. female.   Who presented to the urgent care for complaint of insect bite to left hand for the past 2-3 days.  Report itchiness in burning sensation.  Denies a precipitating event.  Localizes the bite to her left hand.  Has tried OTC medication without relief.  Denies previous hx of insect bite.  Denies fever, chills, nausea, vomiting, headache, dizziness, weakness, fatigue, rash, or abdominal pain.   The history is provided by the patient. No language interpreter was used.    Past Medical History:  Diagnosis Date  . Allergies   . Allergy   . Angina pectoris, unspecified ([place])   . Asthma   . CAD S/P percutaneous coronary angioplasty February 2007   Class III Angina --> Cath: 85% -- PCI 3.78mm x 37mm (4.0 mm) Cypher DES to RCA; Myoview October 2014: normal stress test: LOW RISK; mild anteroapical breast attenuation  . Cataract    h/o bilat repair  . Claudication ([place]) 06/01/2013  . Depression   . Depression 11/29/2013  . Dyslipidemia   . Emphysema of lung ([place])   . GERD (gastroesophageal reflux disease)   . History of palpitations   . History of tobacco abuse   . Hyperlipidemia   . Hypertension    well-controlled  . Hypothyroidism   . Osteoporosis   . Raynaud disease     Patient Active Problem List   Diagnosis Date Noted  . Insomnia 09/19/2014  . Generalized anxiety disorder 09/19/2014  . COPD exacerbation ([place]) 08/20/2014  . Depression 11/29/2013  . Osteoporosis   . Obesity (BMI 30-39.9) 05/31/2013  . Stable angina ([place]) 05/31/2013  . Claudication of left lower extremity ([place]) 05/31/2013  . CAD S/P percutaneous coronary angioplasty   . Dyslipidemia   . Hirsutism 03/03/2011  . Essential hypertension   . Hypothyroid   . Raynaud's disease   . COPD (chronic obstructive  pulmonary disease) ([place])   . Tobacco abuse     Past Surgical History:  Procedure Laterality Date  . ABDOMINAL HYSTERECTOMY     h/o partila hysterectomy  . BREAST BIOPSY  1988  . CORONARY ANGIOPLASTY WITH STENT PLACEMENT  10/14/2005   cypher DES (3.1mmx18mm) to high grade RCA lesion  . NM MYOVIEW LTD  July 2012 of October 2014   '12: dipyridamole; Normal, low risk study; 2014 - normal stress test: LOW RISK; mild anteroapical breast attenuation  . TONSILLECTOMY  1959  . TRANSTHORACIC ECHOCARDIOGRAM  05/22/2009   EF=>55%, normal LV systolic function; normal RV systolic function; mild MR/TR    OB History   No obstetric history on file.      Home Medications    Prior to Admission medications   Medication Sig Start Date End Date Taking? Authorizing Provider  albuterol (PROVENTIL) (2.5 MG/3ML) 0.083% nebulizer solution Take 3 mLs (2.5 mg total) by nebulization every 6 (six) hours as needed for wheezing or shortness of breath. 01/18/18   Delsa Grana, PA-C  albuterol (VENTOLIN HFA) 108 (90 Base) MCG/ACT inhaler Inhale 2 puffs into the lungs every 4 (four) hours as needed for wheezing or shortness of breath. 08/31/19   Susy Frizzle, MD  clonazePAM (KLONOPIN) 0.5 MG tablet Take 0.5-1 tablets (0.25-0.5 mg total) by mouth 2 (two) times daily as needed  for anxiety. 02/08/19   Delsa Grana, PA-C  clopidogrel (PLAVIX) 75 MG tablet TAKE 1 TABLET BY MOUTH EVERY DAY 11/29/19   Susy Frizzle, MD  cromolyn (OPTICROM) 4 % ophthalmic solution Place 1 drop into both eyes 4 (four) times daily. 06/16/16   [provider]  Eszopiclone 3 MG TABS TAKE 1 TABLET BY MOUTH IMMEDIATELY BEFORE BEDTIME 12/25/19   Susy Frizzle, MD  levothyroxine (SYNTHROID) 88 MCG tablet TAKE 1 TABLET BY MOUTH EVERY DAY 03/21/19   Susy Frizzle, MD  montelukast (SINGULAIR) 10 MG tablet TAKE 1 TABLET BY MOUTH EVERYDAY AT BEDTIME 01/27/19   Delsa Grana, PA-C  nitroGLYCERIN (NITROSTAT) 0.4 MG SL tablet Place 1  tablet (0.4 mg total) under the tongue every 5 (five) minutes as needed for chest pain. 03/10/18   Dena Billet B, PA-C  pantoprazole (PROTONIX) 40 MG tablet TAKE 1 TABLET BY MOUTH EVERY DAY 07/20/19   Susy Frizzle, MD  pravastatin (PRAVACHOL) 20 MG tablet Take 1 tablet (20 mg total) by mouth daily. 09/13/19   Susy Frizzle, MD  sertraline (ZOLOFT) 100 MG tablet Take 1 tablet (100 mg total) by mouth daily. 12/29/19   Susy Frizzle, MD  Tiotropium Bromide Monohydrate (SPIRIVA RESPIMAT) 2.5 MCG/ACT AERS INHALE 2 PUFFS BY MOUTH INTO THE LUNGS DAILY 09/13/19   Susy Frizzle, MD  triamcinolone cream (KENALOG) 0.1 % Apply 1 application topically 2 (two) times daily. 02/08/20   AvegnoDarrelyn Hillock, FNP    Family History Family History  Problem Relation Age of Onset  . Liver cancer Mother   . Heart attack Mother   . Heart Problems Sister   . Lupus Sister   . Liver cancer Brother   . Asthma Brother   . Breast cancer Neg Hx     Social History Social History   Tobacco Use  . Smoking status: Current Every Day Smoker    Packs/day: 0.50    Years: 35.00    Pack years: 17.50    Types: Cigarettes  . Smokeless tobacco: Never Used  . Tobacco comment: 11/11/18 still at 1/2ppd  Vaping Use  . Vaping Use: Never used  Substance Use Topics  . Alcohol use: No  . Drug use: No     Allergies   Bystolic [nebivolol hcl], Cephalexin, Effexor [venlafaxine], and Levaquin [levofloxacin in d5w]   Review of Systems Review of Systems  Constitutional: Negative.   Respiratory: Negative.   Cardiovascular: Negative.   Skin: Positive for color change.  All other systems reviewed and are negative.    Physical Exam Triage Vital Signs ED Triage Vitals  Enc Vitals Group     BP 02/08/20 1221 (!) 142/70     Pulse Rate 02/08/20 1221 (!) 54     Resp 02/08/20 1221 (!) 22     Temp 02/08/20 1221 98.3 F (36.8 C)     Temp src --      SpO2 02/08/20 1221 92 %     Weight --      Height --      Head  Circumference --      Peak Flow --      Pain Score 02/08/20 1222 3     Pain Loc --      Pain Edu? --      Excl. in [place]? --    No data found.  Updated Vital Signs BP (!) 142/70   Pulse (!) 54   Temp 98.3 F (36.8 C)   Resp (!)  22   SpO2 92%   Visual Acuity Right Eye Distance:   Left Eye Distance:   Bilateral Distance:    Right Eye Near:   Left Eye Near:    Bilateral Near:     Physical Exam Vitals and nursing note reviewed.  Constitutional:      General: She is not in acute distress.    Appearance: Normal appearance. She is normal weight. She is not ill-appearing, toxic-appearing or diaphoretic.  Cardiovascular:     Rate and Rhythm: Normal rate and regular rhythm.     Pulses: Normal pulses.     Heart sounds: Normal heart sounds. No murmur heard.  No friction rub. No gallop.   Pulmonary:     Effort: Pulmonary effort is normal. No respiratory distress.     Breath sounds: Normal breath sounds. No stridor. No wheezing, rhonchi or rales.  Chest:     Chest wall: No tenderness.  Skin:    General: Skin is warm.     Coloration: Skin is not jaundiced or pale.     Findings: No bruising, ecchymosis, erythema or rash.     Comments: Left hand: dark skin at the site of insect bite  Neurological:     Mental Status: She is alert.      UC Treatments / Results  Labs (all labs ordered are listed, but only abnormal results are displayed) Labs Reviewed - No data to display  EKG   Radiology No results found.  Procedures Procedures (including critical care time)  Medications Ordered in UC Medications - No data to display  Initial Impression / Assessment and Plan / UC Course  I have reviewed the triage vital signs and the nursing notes.  Pertinent labs & imaging results that were available during my care of the patient were reviewed by me and considered in my medical decision making (see chart for details).    Patient is stable for discharge.  At this time there is no  sign of infection.  Patient will be treated with triamcinolone cream for itching.  Was advised to return if she develops worsening symptoms.  Final Clinical Impressions(s) / UC Diagnoses   Final diagnoses:  Insect bite of left hand, initial encounter  Itching     Discharge Instructions     Prescribed triamcinolone  cream  May use calamine lotion use as needed for itchiness Avoid excessive scratching, which may put you at risk for developing a secondary bacterial infection Take OTC anti-inflammatories, like ibuprofen or motrin, as needed for pain and swelling Follow up with PCP if symptoms persist Return or go to ER if you have any new or worsening symptoms    ED Prescriptions    Medication Sig Dispense Auth. Provider   triamcinolone cream (KENALOG) 0.1 % Apply 1 application topically 2 (two) times daily. 30 g Emerson Monte, FNP     PDMP not reviewed this encounter.   Emerson Monte, [place] 02/08/20 1246

## 2020-02-23 ENCOUNTER — Other Ambulatory Visit: Payer: Self-pay

## 2020-02-23 MED ORDER — PANTOPRAZOLE SODIUM 40 MG PO TBEC: 40.0000 mg | DELAYED_RELEASE_TABLET | Freq: Every day | ORAL | 1 refills | Status: DC

## 2020-03-14 ENCOUNTER — Other Ambulatory Visit: Payer: Self-pay | Admitting: Family Medicine

## 2020-03-29 ENCOUNTER — Ambulatory Visit (INDEPENDENT_AMBULATORY_CARE_PROVIDER_SITE_OTHER): Payer: Medicare Other | Admitting: Family Medicine

## 2020-03-29 ENCOUNTER — Other Ambulatory Visit: Payer: Self-pay

## 2020-03-29 VITALS — BP 120/72 | HR 64 | Temp 96.2°F | Ht 61.0 in | Wt 176.0 lb

## 2020-03-29 DIAGNOSIS — S76311A Strain of muscle, fascia and tendon of the posterior muscle group at thigh level, right thigh, initial encounter: Secondary | ICD-10-CM

## 2020-03-29 DIAGNOSIS — I2510 Atherosclerotic heart disease of native coronary artery without angina pectoris: Secondary | ICD-10-CM | POA: Diagnosis not present

## 2020-03-29 DIAGNOSIS — Z9861 Coronary angioplasty status: Secondary | ICD-10-CM

## 2020-03-29 MED ORDER — PREDNISONE 20 MG PO TABS: ORAL_TABLET | ORAL | 0 refills | Status: DC

## 2020-03-29 MED ORDER — TIZANIDINE HCL 4 MG PO TABS
4.0000 mg | ORAL_TABLET | Freq: Four times a day (QID) | ORAL | 0 refills | Status: DC | PRN
Start: 2020-03-29 — End: 2020-05-31

## 2020-03-29 NOTE — Progress Notes (Signed)
Subjective:    Patient ID: Alexa Orozco, female    DOB: 02/06/1946, 74 y.o.   MRN: [id number]  HPI  A week ago, the patient slipped chasing a young child that was running towards the road.  She is landed on her left knee and hyperextended her right hamstring as her foot slipped out from underneath her.  She now has pain in the middle of her right hamstring.  She has pain near the ischial tuberosity.  She has pain that radiates down her hamstring to her knee whenever she sits and drives.  She also complains of a dull aching pain radiating down her leg into her right foot.  She has a negative straight leg raise today although she does have significant tightness in her hamstring.  She has no pain with flexion or extension of her right knee or her right ankle.  There is no bruising or swelling.  She does have tenderness to palpation around the ischial tuberosity and in the middle of the hamstring.  She has no tenderness to palpation along the spinous processes of her lumbar spine. Past Medical History:  Diagnosis Date  . Allergies   . Allergy   . Angina pectoris, unspecified ([place])   . Asthma   . CAD S/P percutaneous coronary angioplasty February 2007   Class III Angina --> Cath: 85% -- PCI 3.23mm x 59mm (4.0 mm) Cypher DES to RCA; Myoview October 2014: normal stress test: LOW RISK; mild anteroapical breast attenuation  . Cataract    h/o bilat repair  . Claudication ([place]) 06/01/2013  . Depression   . Depression 11/29/2013  . Dyslipidemia   . Emphysema of lung ([place])   . GERD (gastroesophageal reflux disease)   . History of palpitations   . History of tobacco abuse   . Hyperlipidemia   . Hypertension    well-controlled  . Hypothyroidism   . Osteoporosis   . Raynaud disease    Past Surgical History:  Procedure Laterality Date  . ABDOMINAL HYSTERECTOMY     h/o partila hysterectomy  . BREAST BIOPSY  1988  . CORONARY ANGIOPLASTY WITH STENT PLACEMENT  10/14/2005   cypher DES (3.90mmx18mm) to high  grade RCA lesion  . NM MYOVIEW LTD  July 2012 of October 2014   '12: dipyridamole; Normal, low risk study; 2014 - normal stress test: LOW RISK; mild anteroapical breast attenuation  . TONSILLECTOMY  1959  . TRANSTHORACIC ECHOCARDIOGRAM  05/22/2009   EF=>55%, normal LV systolic function; normal RV systolic function; mild MR/TR   Current Outpatient Medications on File Prior to Visit  Medication Sig Dispense Refill  . albuterol (PROVENTIL) (2.5 MG/3ML) 0.083% nebulizer solution Take 3 mLs (2.5 mg total) by nebulization every 6 (six) hours as needed for wheezing or shortness of breath. 150 mL 1  . albuterol (VENTOLIN HFA) 108 (90 Base) MCG/ACT inhaler Inhale 2 puffs into the lungs every 4 (four) hours as needed for wheezing or shortness of breath. 18 g 11  . clonazePAM (KLONOPIN) 0.5 MG tablet Take 0.5-1 tablets (0.25-0.5 mg total) by mouth 2 (two) times daily as needed for anxiety. 60 tablet 1  . clopidogrel (PLAVIX) 75 MG tablet TAKE 1 TABLET BY MOUTH EVERY DAY 90 tablet 2  . cromolyn (OPTICROM) 4 % ophthalmic solution Place 1 drop into both eyes 4 (four) times daily.    . Eszopiclone 3 MG TABS TAKE 1 TABLET BY MOUTH IMMEDIATELY BEFORE BEDTIME 30 tablet 3  . levothyroxine (SYNTHROID) 88 MCG tablet TAKE 1 TABLET  BY MOUTH EVERY DAY 90 tablet 3  . montelukast (SINGULAIR) 10 MG tablet TAKE 1 TABLET BY MOUTH EVERYDAY AT BEDTIME 90 tablet 1  . nitroGLYCERIN (NITROSTAT) 0.4 MG SL tablet Place 1 tablet (0.4 mg total) under the tongue every 5 (five) minutes as needed for chest pain. 25 tablet 6  . pantoprazole (PROTONIX) 40 MG tablet Take 1 tablet (40 mg total) by mouth daily. 90 tablet 1  . pravastatin (PRAVACHOL) 20 MG tablet Take 1 tablet (20 mg total) by mouth daily. 90 tablet 3  . sertraline (ZOLOFT) 100 MG tablet Take 1 tablet (100 mg total) by mouth daily. 90 tablet 2  . Tiotropium Bromide Monohydrate (SPIRIVA RESPIMAT) 2.5 MCG/ACT AERS INHALE 2 PUFFS BY MOUTH INTO THE LUNGS DAILY 4 g 11  .  triamcinolone cream (KENALOG) 0.1 % Apply 1 application topically 2 (two) times daily. 30 g 0   No current facility-administered medications on file prior to visit.   Allergies  Allergen Reactions  . Bystolic [Nebivolol Hcl] Hives  . Cephalexin Hives  . Effexor [Venlafaxine] Other (See Comments)    Made her face hot and red  . Levaquin [Levofloxacin In D5w] Other (See Comments)    Caused face to turn red and patient became extremely hot    Social History   Socioeconomic History  . Marital status: Widowed    Spouse name: Not on file  . Number of children: 3  . Years of education: 10th   . Highest education level: Not on file  Occupational History  . Occupation: Retired  Tobacco Use  . Smoking status: Current Every Day Smoker    Packs/day: 0.50    Years: 35.00    Pack years: 17.50    Types: Cigarettes  . Smokeless tobacco: Never Used  . Tobacco comment: 11/11/18 still at 1/2ppd  Vaping Use  . Vaping Use: Never used  Substance and Sexual Activity  . Alcohol use: No  . Drug use: No  . Sexual activity: Not on file  Other Topics Concern  . Not on file  Social History Narrative   She is a widowed mother of 54, grandmother for, great-grandmother 104.  She still smokes about half pack a day.  She doesn't have about 35 years.  She does not drink.   Prior to her symptoms coming on, she used to do all kind of walking around and working in the garden and other activities with her close friend.   Social Determinants of Health   Financial Resource Strain:   . Difficulty of Paying Living Expenses:   Food Insecurity:   . Worried About Charity fundraiser in the Last Year:   . Arboriculturist in the Last Year:   Transportation Needs:   . Film/video editor (Medical):   Marland Kitchen Lack of Transportation (Non-Medical):   Physical Activity:   . Days of Exercise per Week:   . Minutes of Exercise per Session:   Stress:   . Feeling of Stress :   Social Connections:   . Frequency of  Communication with Friends and Family:   . Frequency of Social Gatherings with Friends and Family:   . Attends Religious Services:   . Active Member of Clubs or Organizations:   . Attends Archivist Meetings:   Marland Kitchen Marital Status:   Intimate Partner Violence:   . Fear of Current or Ex-Partner:   . Emotionally Abused:   Marland Kitchen Physically Abused:   . Sexually Abused:  Review of Systems  All other systems reviewed and are negative.      Objective:   Physical Exam Vitals reviewed.  Constitutional:      Appearance: Normal appearance.  Cardiovascular:     Rate and Rhythm: Normal rate and regular rhythm.     Heart sounds: Normal heart sounds.  Pulmonary:     Effort: Pulmonary effort is normal.     Breath sounds: Normal breath sounds.  Musculoskeletal:     Left upper leg: Tenderness present. No swelling, deformity or bony tenderness.     Left knee: Normal.     Left lower leg: Normal.     Left ankle: Normal.       Legs:  Neurological:     Mental Status: She is alert.           Assessment & Plan:  Pulled hamstring, right, initial encounter  Patient is unable to take NSAIDs due to complete a grill.  Therefore I will start her on prednisone taper pack.  She can use tizanidine 4 mg every 6 hours as needed for muscle spasms.  Reassess in 1 week if no better or sooner if worsening

## 2020-04-10 ENCOUNTER — Other Ambulatory Visit: Payer: Self-pay

## 2020-04-10 MED ORDER — LEVOTHYROXINE SODIUM 88 MCG PO TABS: 88.0000 ug | ORAL_TABLET | Freq: Every day | ORAL | 3 refills | Status: DC

## 2020-04-11 ENCOUNTER — Other Ambulatory Visit: Payer: Self-pay

## 2020-04-11 MED ORDER — LEVOTHYROXINE SODIUM 88 MCG PO TABS: 88.0000 ug | ORAL_TABLET | Freq: Every day | ORAL | 3 refills | Status: DC

## 2020-04-21 ENCOUNTER — Other Ambulatory Visit: Payer: Self-pay | Admitting: Family Medicine

## 2020-04-23 ENCOUNTER — Other Ambulatory Visit: Payer: Self-pay | Admitting: Family Medicine

## 2020-04-23 NOTE — Telephone Encounter (Signed)
Ok to refill??  Last office visit 03/29/2020.  Last refill 12/25/2019.

## 2020-05-27 ENCOUNTER — Other Ambulatory Visit: Payer: Self-pay | Admitting: Family Medicine

## 2020-05-27 DIAGNOSIS — J302 Other seasonal allergic rhinitis: Secondary | ICD-10-CM

## 2020-05-27 DIAGNOSIS — J441 Chronic obstructive pulmonary disease with (acute) exacerbation: Secondary | ICD-10-CM

## 2020-05-31 ENCOUNTER — Other Ambulatory Visit: Payer: Self-pay

## 2020-05-31 ENCOUNTER — Ambulatory Visit (INDEPENDENT_AMBULATORY_CARE_PROVIDER_SITE_OTHER): Payer: Medicare Other | Admitting: Family Medicine

## 2020-05-31 VITALS — BP 120/70 | HR 65 | Temp 97.9°F | Ht 61.0 in | Wt 179.0 lb

## 2020-05-31 DIAGNOSIS — M720 Palmar fascial fibromatosis [Dupuytren]: Secondary | ICD-10-CM | POA: Diagnosis not present

## 2020-05-31 DIAGNOSIS — I2510 Atherosclerotic heart disease of native coronary artery without angina pectoris: Secondary | ICD-10-CM | POA: Diagnosis not present

## 2020-05-31 DIAGNOSIS — R5382 Chronic fatigue, unspecified: Secondary | ICD-10-CM | POA: Diagnosis not present

## 2020-05-31 DIAGNOSIS — Z9861 Coronary angioplasty status: Secondary | ICD-10-CM

## 2020-05-31 DIAGNOSIS — R6889 Other general symptoms and signs: Secondary | ICD-10-CM | POA: Diagnosis not present

## 2020-05-31 MED ORDER — TIZANIDINE HCL 4 MG PO TABS
4.0000 mg | ORAL_TABLET | Freq: Four times a day (QID) | ORAL | 2 refills | Status: DC | PRN
Start: 2020-05-31 — End: 2021-05-16

## 2020-05-31 NOTE — Progress Notes (Signed)
Subjective:    Patient ID: Alexa Orozco, female    DOB: [birth year], 74 y.o.   MRN: [id number]  HPI  Patient presents today with pain in the palm of her left hand.  There is a palpable fibrous scar tissue adjacent to the flexor tendon proximal to the MCP joint.  The flexor tendon is visible through the skin.  There is no contracture yet of the MCP joint however the patient reports pain and decreased range of motion.  I believe she is developing Dupuytren's contracture.  She also reports feeling extremely tired.  She denies any chest pain.  She denies any shortness of breath.  She denies any hemoptysis.  She denies any pleurisy.  She denies any fevers or chills.  She denies any melena or hematochezia.  She denies any weight loss.  She does report severe depression.  Daughter also states that she snores and occasionally has apneic episodes.   Past Medical History:  Diagnosis Date  . Allergies   . Allergy   . Angina pectoris, unspecified ([place])   . Asthma   . CAD S/P percutaneous coronary angioplasty February 2007   Class III Angina --> Cath: 85% -- PCI 3.89mm x 25mm (4.0 mm) Cypher DES to RCA; Myoview October 2014: normal stress test: LOW RISK; mild anteroapical breast attenuation  . Cataract    h/o bilat repair  . Claudication ([place]) 06/01/2013  . Depression   . Depression 11/29/2013  . Dyslipidemia   . Emphysema of lung ([place])   . GERD (gastroesophageal reflux disease)   . History of palpitations   . History of tobacco abuse   . Hyperlipidemia   . Hypertension    well-controlled  . Hypothyroidism   . Osteoporosis   . Raynaud disease    Past Surgical History:  Procedure Laterality Date  . ABDOMINAL HYSTERECTOMY     h/o partila hysterectomy  . BREAST BIOPSY  1988  . CORONARY ANGIOPLASTY WITH STENT PLACEMENT  10/14/2005   cypher DES (3.54mmx18mm) to high grade RCA lesion  . NM MYOVIEW LTD  July 2012 of October 2014   '12: dipyridamole; Normal, low risk study; 2014 - normal stress test: LOW  RISK; mild anteroapical breast attenuation  . TONSILLECTOMY  1959  . TRANSTHORACIC ECHOCARDIOGRAM  05/22/2009   EF=>55%, normal LV systolic function; normal RV systolic function; mild MR/TR   Current Outpatient Medications on File Prior to Visit  Medication Sig Dispense Refill  . albuterol (PROVENTIL) (2.5 MG/3ML) 0.083% nebulizer solution Take 3 mLs (2.5 mg total) by nebulization every 6 (six) hours as needed for wheezing or shortness of breath. 150 mL 1  . albuterol (VENTOLIN HFA) 108 (90 Base) MCG/ACT inhaler Inhale 2 puffs into the lungs every 4 (four) hours as needed for wheezing or shortness of breath. 18 g 11  . clonazePAM (KLONOPIN) 0.5 MG tablet Take 0.5-1 tablets (0.25-0.5 mg total) by mouth 2 (two) times daily as needed for anxiety. 60 tablet 1  . clopidogrel (PLAVIX) 75 MG tablet TAKE 1 TABLET BY MOUTH EVERY DAY 90 tablet 2  . cromolyn (OPTICROM) 4 % ophthalmic solution Place 1 drop into both eyes 4 (four) times daily.    . Eszopiclone 3 MG TABS TAKE 1 TABLET BY MOUTH IMMEDIATELY BEFORE BEDTIME 30 tablet 3  . levothyroxine (SYNTHROID) 88 MCG tablet Take 1 tablet (88 mcg total) by mouth daily. 90 tablet 3  . montelukast (SINGULAIR) 10 MG tablet TAKE 1 TABLET BY MOUTH EVERYDAY AT BEDTIME 90 tablet 1  .  nitroGLYCERIN (NITROSTAT) 0.4 MG SL tablet Place 1 tablet (0.4 mg total) under the tongue every 5 (five) minutes as needed for chest pain. 25 tablet 6  . pantoprazole (PROTONIX) 40 MG tablet Take 1 tablet (40 mg total) by mouth daily. 90 tablet 1  . pravastatin (PRAVACHOL) 20 MG tablet Take 1 tablet (20 mg total) by mouth daily. 90 tablet 3  . sertraline (ZOLOFT) 100 MG tablet TAKE 1 TABLET BY MOUTH EVERY DAY 90 tablet 2  . Tiotropium Bromide Monohydrate (SPIRIVA RESPIMAT) 2.5 MCG/ACT AERS INHALE 2 PUFFS BY MOUTH INTO THE LUNGS DAILY 4 g 11  . triamcinolone cream (KENALOG) 0.1 % Apply 1 application topically 2 (two) times daily. 30 g 0   No current facility-administered medications on  file prior to visit.   Allergies  Allergen Reactions  . Bystolic [Nebivolol Hcl] Hives  . Cephalexin Hives  . Effexor [Venlafaxine] Other (See Comments)    Made her face hot and red  . Levaquin [Levofloxacin In D5w] Other (See Comments)    Caused face to turn red and patient became extremely hot    Social History   Socioeconomic History  . Marital status: Widowed    Spouse name: Not on file  . Number of children: 3  . Years of education: 10th   . Highest education level: Not on file  Occupational History  . Occupation: Retired  Tobacco Use  . Smoking status: Current Every Day Smoker    Packs/day: 0.50    Years: 35.00    Pack years: 17.50    Types: Cigarettes  . Smokeless tobacco: Never Used  . Tobacco comment: 11/11/18 still at 1/2ppd  Vaping Use  . Vaping Use: Never used  Substance and Sexual Activity  . Alcohol use: No  . Drug use: No  . Sexual activity: Not on file  Other Topics Concern  . Not on file  Social History Narrative   She is a widowed mother of 77, grandmother for, great-grandmother 18.  She still smokes about half pack a day.  She doesn't have about 35 years.  She does not drink.   Prior to her symptoms coming on, she used to do all kind of walking around and working in the garden and other activities with her close friend.   Social Determinants of Health   Financial Resource Strain:   . Difficulty of Paying Living Expenses: Not on file  Food Insecurity:   . Worried About Charity fundraiser in the Last Year: Not on file  . Ran Out of Food in the Last Year: Not on file  Transportation Needs:   . Lack of Transportation (Medical): Not on file  . Lack of Transportation (Non-Medical): Not on file  Physical Activity:   . Days of Exercise per Week: Not on file  . Minutes of Exercise per Session: Not on file  Stress:   . Feeling of Stress : Not on file  Social Connections:   . Frequency of Communication with Friends and Family: Not on file  . Frequency  of Social Gatherings with Friends and Family: Not on file  . Attends Religious Services: Not on file  . Active Member of Clubs or Organizations: Not on file  . Attends Archivist Meetings: Not on file  . Marital Status: Not on file  Intimate Partner Violence:   . Fear of Current or Ex-Partner: Not on file  . Emotionally Abused: Not on file  . Physically Abused: Not on file  .  Sexually Abused: Not on file     Review of Systems  All other systems reviewed and are negative.      Objective:   Physical Exam Vitals reviewed.  Constitutional:      Appearance: Normal appearance.  Cardiovascular:     Rate and Rhythm: Normal rate and regular rhythm.     Heart sounds: Normal heart sounds.  Pulmonary:     Effort: Pulmonary effort is normal.     Breath sounds: Normal breath sounds.  Musculoskeletal:     Left lower leg: Normal.     Left ankle: Normal.  Neurological:     Mental Status: She is alert.           Assessment & Plan:  Chronic fatigue - Plan: CBC with Differential/Platelet, COMPLETE METABOLIC PANEL WITH GFR, Vitamin B12, TSH  Dupuytren's contracture of left hand - Plan: Ambulatory referral to Hand Surgery  Consult orthopedics regarding the Dupuytren's contracture of her left hand.  For her chronic fatigue I will check a CBC, CMP, B12, TSH.  Next that would be a chest x-ray and an echocardiogram however also believe depression may be playing a role.  Patient will await the results of the lab work and then determine if she wants to take the next diagnostic step.  We also discussed possibly performing a sleep study.

## 2020-06-01 LAB — COMPLETE METABOLIC PANEL WITH GFR
AG Ratio: 1.4 (calc) (ref 1.0–2.5)
ALT: 15 U/L (ref 6–29)
AST: 18 U/L (ref 10–35)
Albumin: 4 g/dL (ref 3.6–5.1)
Alkaline phosphatase (APISO): 97 U/L (ref 37–153)
BUN/Creatinine Ratio: 18 (calc) (ref 6–22)
BUN: 17 mg/dL (ref 7–25)
CO2: 26 mmol/L (ref 20–32)
Calcium: 9.6 mg/dL (ref 8.6–10.4)
Chloride: 103 mmol/L (ref 98–110)
Creat: 0.94 mg/dL — ABNORMAL HIGH (ref 0.60–0.93)
GFR, Est African American: 70 mL/min/{1.73_m2} (ref >=60)
GFR, Est Non African American: 61 mL/min/{1.73_m2} (ref >=60)
Globulin: 2.8 g/dL (calc) (ref 1.9–3.7)
Glucose, Bld: 81 mg/dL (ref 65–99)
Potassium: 4.9 mmol/L (ref 3.5–5.3)
Sodium: 137 mmol/L (ref 135–146)
Total Bilirubin: 0.4 mg/dL (ref 0.2–1.2)
Total Protein: 6.8 g/dL (ref 6.1–8.1)

## 2020-06-01 LAB — CBC WITH DIFFERENTIAL/PLATELET
Absolute Monocytes: 704 cells/uL (ref 200–950)
Basophils Absolute: 41 cells/uL (ref 0–200)
Basophils Relative: 0.6 %
Eosinophils Absolute: 283 cells/uL (ref 15–500)
Eosinophils Relative: 4.1 %
HCT: 41.6 % (ref 35.0–45.0)
Hemoglobin: 14 g/dL (ref 11.7–15.5)
Lymphs Abs: 2360 cells/uL (ref 850–3900)
MCH: 31.8 pg (ref 27.0–33.0)
MCHC: 33.7 g/dL (ref 32.0–36.0)
MCV: 94.5 fL (ref 80.0–100.0)
MPV: 10.3 fL (ref 7.5–12.5)
Monocytes Relative: 10.2 %
Neutro Abs: 3512 cells/uL (ref 1500–7800)
Neutrophils Relative %: 50.9 %
Platelets: 316 10*3/uL (ref 140–400)
RBC: 4.4 10*6/uL (ref 3.80–5.10)
RDW: 13 % (ref 11.0–15.0)
Total Lymphocyte: 34.2 %
WBC: 6.9 10*3/uL (ref 3.8–10.8)

## 2020-06-01 LAB — VITAMIN B12: Vitamin B-12: 444 pg/mL (ref 200–1100)

## 2020-06-01 LAB — TSH: TSH: 3.31 mIU/L (ref 0.40–4.50)

## 2020-06-03 ENCOUNTER — Encounter: Payer: Self-pay | Admitting: *Deleted

## 2020-06-12 DIAGNOSIS — M720 Palmar fascial fibromatosis [Dupuytren]: Secondary | ICD-10-CM | POA: Insufficient documentation

## 2020-06-12 DIAGNOSIS — M65331 Trigger finger, right middle finger: Secondary | ICD-10-CM | POA: Diagnosis not present

## 2020-07-28 ENCOUNTER — Other Ambulatory Visit: Payer: Self-pay | Admitting: Family Medicine

## 2020-08-09 ENCOUNTER — Telehealth (INDEPENDENT_AMBULATORY_CARE_PROVIDER_SITE_OTHER): Payer: Medicare Other | Admitting: Nurse Practitioner

## 2020-08-09 ENCOUNTER — Other Ambulatory Visit: Payer: Self-pay

## 2020-08-09 ENCOUNTER — Ambulatory Visit: Payer: Medicare Other | Admitting: Nurse Practitioner

## 2020-08-09 DIAGNOSIS — J302 Other seasonal allergic rhinitis: Secondary | ICD-10-CM | POA: Diagnosis not present

## 2020-08-09 DIAGNOSIS — F411 Generalized anxiety disorder: Secondary | ICD-10-CM

## 2020-08-09 DIAGNOSIS — J441 Chronic obstructive pulmonary disease with (acute) exacerbation: Secondary | ICD-10-CM

## 2020-08-09 MED ORDER — PREDNISONE 20 MG PO TABS: ORAL_TABLET | ORAL | 0 refills | Status: AC

## 2020-08-09 MED ORDER — BENZONATATE 100 MG PO CAPS: 100.0000 mg | ORAL_CAPSULE | Freq: Three times a day (TID) | ORAL | 0 refills | Status: DC | PRN

## 2020-08-09 MED ORDER — ALBUTEROL SULFATE (2.5 MG/3ML) 0.083% IN NEBU: 2.5000 mg | INHALATION_SOLUTION | Freq: Four times a day (QID) | RESPIRATORY_TRACT | 1 refills | Status: DC | PRN

## 2020-08-09 MED ORDER — CLONAZEPAM 0.5 MG PO TABS: 0.2500 mg | ORAL_TABLET | Freq: Every day | ORAL | 1 refills | Status: DC | PRN

## 2020-08-09 MED ORDER — MONTELUKAST SODIUM 10 MG PO TABS: ORAL_TABLET | ORAL | 1 refills | Status: DC

## 2020-08-09 NOTE — Assessment & Plan Note (Addendum)
Chronic, currently with acute exacerbation.  Will treat with prednisone taper, tessalon pearls, and encouraged nebulizer use q4-6 hours prn wheezing or shortness of breath.  Refills given for nebulizer.  Will also refill Singulair as patient has run out.  Continue maintenance inhaler.  If symptoms persist into mid-week next week, notify clinic for chest x-ray to rule out pneumonia.  Encouraged COVID-19 testing now and COVID vaccination when well.  May consider pulmonary referral if exacerbations become more frequent.  With any sudden onset of chest pain, go to ED.

## 2020-08-09 NOTE — Progress Notes (Signed)
Subjective:    Patient ID: Alexa Orozco, female    DOB: [birth year], 74 y.o.   MRN: [id number]  HPI: Alexa Orozco is a 74 y.o. female presenting for cough.  Chief Complaint  Patient presents with  . Cough    Pt believes she has bronchitis, says it happens yrly. She is asking for cough pearls and prednisone. Taking otc tylenol for now. Having productive cough. Denies chills or fever. Also has medication pending for refill   UPPER RESPIRATORY TRACT INFECTION COVID testing: did not obtain Onset: mid-week last week Worst symptom: cough Fever: no Cough: yes ; productive with mucus sometimes dry Shortness of breath: yes Wheezing: yes Chest pain: no Chest tightness: no Chest congestion: yes Nasal congestion: no Runny nose: yes Post nasal drip: yes Sneezing: yes Sore throat: no Swollen glands: no Sinus pressure: no Headache: no Face pain: no Toothache: no Ear pain: no  Ear pressure: no  Eyes red/itching:no Eye drainage/crusting: no  Nausea: yes Vomiting: no Diarrhea: no Constipation: no Change in appetite: eating normally Loss of taste/smell: no Rash: no Fatigue: yes Sick contacts: no Strep contacts: no  Context: worse - coughing Recurrent sinusitis: no Treatments attempted: Tylenol, inhalers, has been using breathing machine a few times per day Relief with OTC medications: no  ANXIETY/STRESS Reports has felt slight increase in anxiety lately.  Is requesting refill of clonazepam.  Takes 0.5 tablet a few times per week at most. Duration: chronic Anxious mood: yes  Excessive worrying: yes Irritability: no  Sweating: no Nausea: no Palpitations:no Hyperventilation: no Panic attacks: no Agoraphobia: no  Obscessions/compulsions: no Depressed mood: no Depression screen [facility] 2/9 03/09/2018 05/25/2017 04/19/2017 02/08/2017 12/03/2016  Decreased Interest 3 2 0 3 3  Down, Depressed, Hopeless 3 2 2 3 3   PHQ - 2 Score 6 4 2 6 6   Altered sleeping 3 1 0 2 3  Tired, decreased  energy 3 3 0 3 3  Change in appetite 0 1 0 2 3  Feeling bad or failure about yourself  0 1 0 2 3  Trouble concentrating 3 2 0 3 3  Moving slowly or fidgety/restless 3 1 0 3 3  Suicidal thoughts 0 1 0 0 0  PHQ-9 Score 18 14 2 21 24   Difficult doing work/chores Not difficult at all Somewhat difficult Not difficult at all Somewhat difficult Somewhat difficult    No flowsheet data found.  Anhedonia: no Weight changes: no Insomnia: no   Hypersomnia: no Fatigue/loss of energy: no Feelings of worthlessness: no Feelings of guilt: no Impaired concentration/indecisiveness: no Suicidal ideations: no  Crying spells: no Recent Stressors/Life Changes: yes   Relationship problems: no   Family stress: no     Financial stress: yes    Job stress: no    Recent death/loss: no  Allergies  Allergen Reactions  . Bystolic [Nebivolol Hcl] Hives  . Cephalexin Hives  . Effexor [Venlafaxine] Other (See Comments)    Made her face hot and red  . Levaquin [Levofloxacin In D5w] Other (See Comments)    Caused face to turn red and patient became extremely hot     Outpatient Encounter Medications as of 08/09/2020  Medication Sig  . albuterol (VENTOLIN HFA) 108 (90 Base) MCG/ACT inhaler Inhale 2 puffs into the lungs every 4 (four) hours as needed for wheezing or shortness of breath.  . clopidogrel (PLAVIX) 75 MG tablet TAKE 1 TABLET BY MOUTH EVERY DAY  . cromolyn (OPTICROM) 4 % ophthalmic solution Place 1 drop into  both eyes 4 (four) times daily.  . Eszopiclone 3 MG TABS TAKE 1 TABLET BY MOUTH IMMEDIATELY BEFORE BEDTIME  . levothyroxine (SYNTHROID) 88 MCG tablet Take 1 tablet (88 mcg total) by mouth daily.  . nitroGLYCERIN (NITROSTAT) 0.4 MG SL tablet Place 1 tablet (0.4 mg total) under the tongue every 5 (five) minutes as needed for chest pain.  . pantoprazole (PROTONIX) 40 MG tablet Take 1 tablet (40 mg total) by mouth daily.  . pravastatin (PRAVACHOL) 20 MG tablet TAKE 1 TABLET BY MOUTH EVERY DAY  .  sertraline (ZOLOFT) 100 MG tablet TAKE 1 TABLET BY MOUTH EVERY DAY  . Tiotropium Bromide Monohydrate (SPIRIVA RESPIMAT) 2.5 MCG/ACT AERS INHALE 2 PUFFS BY MOUTH INTO THE LUNGS DAILY  . tiZANidine (ZANAFLEX) 4 MG tablet Take 1 tablet (4 mg total) by mouth every 6 (six) hours as needed for muscle spasms.  Marland Kitchen triamcinolone cream (KENALOG) 0.1 % Apply 1 application topically 2 (two) times daily.  . [DISCONTINUED] albuterol (PROVENTIL) (2.5 MG/3ML) 0.083% nebulizer solution Take 3 mLs (2.5 mg total) by nebulization every 6 (six) hours as needed for wheezing or shortness of breath.  . [DISCONTINUED] clonazePAM (KLONOPIN) 0.5 MG tablet Take 0.5-1 tablets (0.25-0.5 mg total) by mouth 2 (two) times daily as needed for anxiety.  . [DISCONTINUED] montelukast (SINGULAIR) 10 MG tablet TAKE 1 TABLET BY MOUTH EVERYDAY AT BEDTIME  . albuterol (PROVENTIL) (2.5 MG/3ML) 0.083% nebulizer solution Take 3 mLs (2.5 mg total) by nebulization every 6 (six) hours as needed for wheezing or shortness of breath.  . benzonatate (TESSALON) 100 MG capsule Take 1 capsule (100 mg total) by mouth 3 (three) times daily as needed for cough.  . clonazePAM (KLONOPIN) 0.5 MG tablet Take 0.5-1 tablets (0.25-0.5 mg total) by mouth daily as needed for anxiety.  . montelukast (SINGULAIR) 10 MG tablet TAKE 1 TABLET BY MOUTH EVERYDAY AT BEDTIME  . predniSONE (DELTASONE) 20 MG tablet Take 3 tablets (60 mg total) by mouth daily with breakfast for 2 days, THEN 2 tablets (40 mg total) daily with breakfast for 2 days, THEN 1 tablet (20 mg total) daily with breakfast for 2 days.   No facility-administered encounter medications on file as of 08/09/2020.    Patient Active Problem List   Diagnosis Date Noted  . Insomnia 09/19/2014  . Generalized anxiety disorder 09/19/2014  . COPD exacerbation ([place]) 08/20/2014  . Depression 11/29/2013  . Osteoporosis   . Obesity (BMI 30-39.9) 05/31/2013  . Stable angina ([place]) 05/31/2013  . Claudication of left  lower extremity ([place]) 05/31/2013  . CAD S/P percutaneous coronary angioplasty   . Dyslipidemia   . Hirsutism 03/03/2011  . Essential hypertension   . Hypothyroid   . Raynaud's disease   . COPD (chronic obstructive pulmonary disease) ([place])   . Tobacco abuse     Past Medical History:  Diagnosis Date  . Allergies   . Allergy   . Angina pectoris, unspecified ([place])   . Asthma   . CAD S/P percutaneous coronary angioplasty February 2007   Class III Angina --> Cath: 85% -- PCI 3.58mm x 72mm (4.0 mm) Cypher DES to RCA; Myoview October 2014: normal stress test: LOW RISK; mild anteroapical breast attenuation  . Cataract    h/o bilat repair  . Claudication ([place]) 06/01/2013  . Depression   . Depression 11/29/2013  . Dyslipidemia   . Emphysema of lung ([place])   . GERD (gastroesophageal reflux disease)   . History of palpitations   . History of tobacco abuse   .  Hyperlipidemia   . Hypertension    well-controlled  . Hypothyroidism   . Osteoporosis   . Raynaud disease     Relevant past medical, surgical, family and social history reviewed and updated as indicated. Interim medical history since our last visit reviewed.  Review of Systems  Constitutional: Positive for fatigue. Negative for activity change, appetite change and fever.  HENT: Positive for congestion, postnasal drip, rhinorrhea, sinus pressure and sore throat. Negative for ear discharge, ear pain, nosebleeds, sinus pain, sneezing, trouble swallowing and voice change.   Eyes: Negative.  Negative for pain, discharge, redness and itching.  Respiratory: Positive for cough, chest tightness, shortness of breath and wheezing.   Cardiovascular: Negative for chest pain and palpitations.  Gastrointestinal: Negative.  Negative for nausea and vomiting.  Skin: Negative.  Negative for rash.  Neurological: Negative.   Hematological: Negative.  Negative for adenopathy.  Psychiatric/Behavioral: Negative.    Per HPI unless specifically indicated  above     Objective:    There were no vitals taken for this visit.  Wt Readings from Last 3 Encounters:  05/31/20 179 lb (81.2 kg)  03/29/20 176 lb (79.8 kg)  12/25/19 171 lb 12.8 oz (77.9 kg)    Physical Exam  Physical examination unable to be performed due to telephone visit.  Patient talking in complete sentences with cough at times.     Assessment & Plan:   Problem List Items Addressed This Visit      Respiratory   COPD (chronic obstructive pulmonary disease) ([place])    Chronic, currently with acute exacerbation.  Will treat with prednisone taper, tessalon pearls, and encouraged nebulizer use q4-6 hours prn wheezing or shortness of breath.  Refills given for nebulizer.  Will also refill Singulair as patient has run out.  Continue maintenance inhaler.  If symptoms persist into mid-week next week, notify clinic for chest x-ray to rule out pneumonia.  Encouraged COVID-19 testing now and COVID vaccination when well.  May consider pulmonary referral if exacerbations become more frequent.  With any sudden onset of chest pain, go to ED.      Relevant Medications   montelukast (SINGULAIR) 10 MG tablet   albuterol (PROVENTIL) (2.5 MG/3ML) 0.083% nebulizer solution   benzonatate (TESSALON) 100 MG capsule   predniSONE (DELTASONE) 20 MG tablet     Other   Generalized anxiety disorder - Primary    Chronic, stable on regimen of sertraline and as needed clonazepam.  Discussed at length use of clonazepam.  PT aware of risks of psychoactive medication use to include increased sedation, respiratory suppression, falls, extrapyramidal movements,  dependence and cardiovascular events.  PT would like to continue treatment as benefit determined to outweigh risk.  PDMP reviewed and appropriate, will refill clonazepam today, should last at least 6 months or longer.          Other Visit Diagnoses    Seasonal allergies       Relevant Medications   montelukast (SINGULAIR) 10 MG tablet   Acute  exacerbation of chronic obstructive pulmonary disease (COPD) ([facility])       Relevant Medications   montelukast (SINGULAIR) 10 MG tablet   albuterol (PROVENTIL) (2.5 MG/3ML) 0.083% nebulizer solution   benzonatate (TESSALON) 100 MG capsule   predniSONE (DELTASONE) 20 MG tablet       Follow up plan: Return in about 3 months (around 11/07/2020), or if symptoms worsen or fail to improve.  This visit was completed via telephone due to the restrictions of the COVID-19 pandemic.  All issues as above were discussed and addressed but no physical exam was performed. If it was felt that the patient should be evaluated in the office, they were directed there. The patient verbally consented to this visit. Patient was unable to complete an audio/visual visit due to Lack of equipment. . Location of the patient: home . Location of the provider: work . Those involved with this call:  . Provider: Carnella Guadalajara, DNP . CMA: Annabelle Harman, CMA . Front Desk/Registration: Jeralene Peters  . Time spent on call: 20 minutes on the phone discussing health concerns. 30 minutes total spent in review of patient's record and preparation of their chart.  I verified patient identity using two factors (patient name and date of birth). Patient consents verbally to being seen via telemedicine visit today.

## 2020-08-09 NOTE — Assessment & Plan Note (Signed)
Chronic, stable on regimen of sertraline and as needed clonazepam.  Discussed at length use of clonazepam.  PT aware of risks of psychoactive medication use to include increased sedation, respiratory suppression, falls, extrapyramidal movements,  dependence and cardiovascular events.  PT would like to continue treatment as benefit determined to outweigh risk.  PDMP reviewed and appropriate, will refill clonazepam today, should last at least 6 months or longer.

## 2020-08-14 ENCOUNTER — Telehealth: Payer: Self-pay | Admitting: Family Medicine

## 2020-08-14 DIAGNOSIS — R059 Cough, unspecified: Secondary | ICD-10-CM

## 2020-08-14 NOTE — Telephone Encounter (Signed)
Was told call the office if her systems get worst after  taking medication that was prescribe to reach out to the office

## 2020-08-14 NOTE — Telephone Encounter (Signed)
Pt called to report that cough has gotten worse along with headache. Asking for antibiotic

## 2020-08-15 MED ORDER — AMOXICILLIN-POT CLAVULANATE 875-125 MG PO TABS: 1.0000 | ORAL_TABLET | Freq: Two times a day (BID) | ORAL | 0 refills | Status: AC

## 2020-08-15 NOTE — Telephone Encounter (Signed)
I would send this to Janett Billow since she saw her for this, I have not seen her so I don't know the situation

## 2020-08-15 NOTE — Telephone Encounter (Signed)
Called called, verbalized understanding regarding xray and covid testing

## 2020-08-26 ENCOUNTER — Other Ambulatory Visit: Payer: Self-pay

## 2020-08-26 ENCOUNTER — Telehealth: Payer: Self-pay

## 2020-08-26 MED ORDER — ONDANSETRON HCL 4 MG PO TABS: 4.0000 mg | ORAL_TABLET | Freq: Three times a day (TID) | ORAL | 0 refills | Status: DC | PRN

## 2020-08-26 NOTE — Telephone Encounter (Signed)
Patient had virtual telephone visit with NP, she's finished with course of abx, she's feeling nauseas, asked if something could be sent in for nausea

## 2020-08-28 ENCOUNTER — Other Ambulatory Visit: Payer: Self-pay | Admitting: Family Medicine

## 2020-09-11 ENCOUNTER — Telehealth: Payer: Self-pay

## 2020-09-11 NOTE — Telephone Encounter (Signed)
Mrs. Satterfield called wit UTI symptoms. A little Burning   Pressure when voiding Trinkle with only a small amount of void Will schedule with virtual visit

## 2020-09-12 ENCOUNTER — Telehealth (INDEPENDENT_AMBULATORY_CARE_PROVIDER_SITE_OTHER): Payer: Medicare Other | Admitting: Family Medicine

## 2020-09-12 DIAGNOSIS — N3000 Acute cystitis without hematuria: Secondary | ICD-10-CM | POA: Diagnosis not present

## 2020-09-12 MED ORDER — NITROFURANTOIN MONOHYD MACRO 100 MG PO CAPS: 100.0000 mg | ORAL_CAPSULE | Freq: Two times a day (BID) | ORAL | 0 refills | Status: DC

## 2020-09-12 NOTE — Progress Notes (Signed)
Subjective:    Patient ID: Alexa Orozco, female    DOB: 01/07/1946, 75 y.o.   MRN: [id number]  HPI Patient is being seen today as a telephone visit.  Phone call began at 950.  Phone call concluded at 1001.  Patient states she feels like she has a urinary tract infection.  She states that symptoms have been going on for about a week now.  She reports a pressure-like sensation in her bladder.  She reports increased urinary frequency.  She states she feels like she has to go to the bathroom and urinate every 10 or 15 minutes however when she goes to the bathroom it only trickles to use her terminology.  As soon as she stands up she feels like she has to urinate again.  She states that it burns when she pees.  She denies any back pain.  She denies any fevers or chills or signs of systemic illness.  She denies any hematuria.  She denies any constipation.  She has a history of allergies to sulfur per her report although this is not listed.  She also has a history of allergies to Levaquin and Keflex.  She denies any previous reaction to Macrobid. Past Medical History:  Diagnosis Date  . Allergies   . Allergy   . Angina pectoris, unspecified ([place])   . Asthma   . CAD S/P percutaneous coronary angioplasty February 2007   Class III Angina --> Cath: 85% -- PCI 3.71mm x 17mm (4.0 mm) Cypher DES to RCA; Myoview October 2014: normal stress test: LOW RISK; mild anteroapical breast attenuation  . Cataract    h/o bilat repair  . Claudication ([place]) 06/01/2013  . Depression   . Depression 11/29/2013  . Dyslipidemia   . Emphysema of lung ([place])   . GERD (gastroesophageal reflux disease)   . History of palpitations   . History of tobacco abuse   . Hyperlipidemia   . Hypertension    well-controlled  . Hypothyroidism   . Osteoporosis   . Raynaud disease    Past Surgical History:  Procedure Laterality Date  . ABDOMINAL HYSTERECTOMY     h/o partila hysterectomy  . BREAST BIOPSY  1988  . CORONARY ANGIOPLASTY WITH  STENT PLACEMENT  10/14/2005   cypher DES (3.70mmx18mm) to high grade RCA lesion  . NM MYOVIEW LTD  July 2012 of October 2014   '12: dipyridamole; Normal, low risk study; 2014 - normal stress test: LOW RISK; mild anteroapical breast attenuation  . TONSILLECTOMY  1959  . TRANSTHORACIC ECHOCARDIOGRAM  05/22/2009   EF=>55%, normal LV systolic function; normal RV systolic function; mild MR/TR   Current Outpatient Medications on File Prior to Visit  Medication Sig Dispense Refill  . albuterol (PROVENTIL) (2.5 MG/3ML) 0.083% nebulizer solution Take 3 mLs (2.5 mg total) by nebulization every 6 (six) hours as needed for wheezing or shortness of breath. 150 mL 1  . albuterol (VENTOLIN HFA) 108 (90 Base) MCG/ACT inhaler Inhale 2 puffs into the lungs every 4 (four) hours as needed for wheezing or shortness of breath. 18 g 11  . benzonatate (TESSALON) 100 MG capsule Take 1 capsule (100 mg total) by mouth 3 (three) times daily as needed for cough. 30 capsule 0  . clonazePAM (KLONOPIN) 0.5 MG tablet Take 0.5-1 tablets (0.25-0.5 mg total) by mouth daily as needed for anxiety. 60 tablet 1  . clopidogrel (PLAVIX) 75 MG tablet TAKE 1 TABLET BY MOUTH EVERY DAY 90 tablet 2  . cromolyn (OPTICROM) 4 %  ophthalmic solution Place 1 drop into both eyes 4 (four) times daily.    . Eszopiclone 3 MG TABS TAKE 1 TABLET BY MOUTH IMMEDIATELY BEFORE BEDTIME 30 tablet 3  . levothyroxine (SYNTHROID) 88 MCG tablet Take 1 tablet (88 mcg total) by mouth daily. 90 tablet 3  . montelukast (SINGULAIR) 10 MG tablet TAKE 1 TABLET BY MOUTH EVERYDAY AT BEDTIME 90 tablet 1  . nitroGLYCERIN (NITROSTAT) 0.4 MG SL tablet Place 1 tablet (0.4 mg total) under the tongue every 5 (five) minutes as needed for chest pain. 25 tablet 6  . ondansetron (ZOFRAN) 4 MG tablet Take 1 tablet (4 mg total) by mouth every 8 (eight) hours as needed for nausea or vomiting. 20 tablet 0  . pantoprazole (PROTONIX) 40 MG tablet Take 1 tablet (40 mg total) by mouth daily.  90 tablet 1  . pravastatin (PRAVACHOL) 20 MG tablet TAKE 1 TABLET BY MOUTH EVERY DAY 90 tablet 3  . sertraline (ZOLOFT) 100 MG tablet TAKE 1 TABLET BY MOUTH EVERY DAY 90 tablet 2  . Tiotropium Bromide Monohydrate (SPIRIVA RESPIMAT) 2.5 MCG/ACT AERS INHALE 2 PUFFS BY MOUTH INTO THE LUNGS DAILY 4 g 11  . tiZANidine (ZANAFLEX) 4 MG tablet Take 1 tablet (4 mg total) by mouth every 6 (six) hours as needed for muscle spasms. 30 tablet 2  . triamcinolone cream (KENALOG) 0.1 % Apply 1 application topically 2 (two) times daily. 30 g 0   No current facility-administered medications on file prior to visit.   Allergies  Allergen Reactions  . Bystolic [Nebivolol Hcl] Hives  . Cephalexin Hives  . Effexor [Venlafaxine] Other (See Comments)    Made her face hot and red  . Levaquin [Levofloxacin In D5w] Other (See Comments)    Caused face to turn red and patient became extremely hot    Social History   Socioeconomic History  . Marital status: Widowed    Spouse name: Not on file  . Number of children: 3  . Years of education: 10th   . Highest education level: Not on file  Occupational History  . Occupation: Retired  Tobacco Use  . Smoking status: Current Every Day Smoker    Packs/day: 0.50    Years: 35.00    Pack years: 17.50    Types: Cigarettes  . Smokeless tobacco: Never Used  . Tobacco comment: 11/11/18 still at 1/2ppd  Vaping Use  . Vaping Use: Never used  Substance and Sexual Activity  . Alcohol use: No  . Drug use: No  . Sexual activity: Not on file  Other Topics Concern  . Not on file  Social History Narrative   She is a widowed mother of 43, grandmother for, great-grandmother 74.  She still smokes about half pack a day.  She doesn't have about 35 years.  She does not drink.   Prior to her symptoms coming on, she used to do all kind of walking around and working in the garden and other activities with her close friend.   Social Determinants of Health   Financial Resource Strain:  Not on file  Food Insecurity: Not on file  Transportation Needs: Not on file  Physical Activity: Not on file  Stress: Not on file  Social Connections: Not on file  Intimate Partner Violence: Not on file      Review of Systems  All other systems reviewed and are negative.      Objective:   Physical Exam        Assessment &  Plan:  Acute cystitis without hematuria  I agree with the patient, her symptoms sound consistent with a urinary tract infection.  Begin Macrobid 1 tablet twice daily for 7 days.  Recheck in 1 week if no better or sooner if worsening.

## 2020-09-23 ENCOUNTER — Telehealth: Payer: Self-pay | Admitting: Family Medicine

## 2020-09-23 ENCOUNTER — Other Ambulatory Visit: Payer: Self-pay | Admitting: Family Medicine

## 2020-09-23 NOTE — Telephone Encounter (Signed)
Please advise 

## 2020-09-23 NOTE — Telephone Encounter (Signed)
Pt called said that she wants a refill on her rx for antibiotics for her UTI, but feels she still has symptoms  Cb#: [id number]

## 2020-09-24 ENCOUNTER — Other Ambulatory Visit: Payer: Self-pay | Admitting: Family Medicine

## 2020-09-24 MED ORDER — SULFAMETHOXAZOLE-TRIMETHOPRIM 800-160 MG PO TABS: 1.0000 | ORAL_TABLET | Freq: Two times a day (BID) | ORAL | 0 refills | Status: DC

## 2020-09-24 NOTE — Telephone Encounter (Signed)
I sent bactrim to her pharmacy  

## 2020-09-27 ENCOUNTER — Other Ambulatory Visit: Payer: Self-pay | Admitting: Family Medicine

## 2020-09-27 DIAGNOSIS — J441 Chronic obstructive pulmonary disease with (acute) exacerbation: Secondary | ICD-10-CM

## 2020-10-01 NOTE — Telephone Encounter (Signed)
Pt stated that she has a possible UTI or Bladder infection and wants to be seen and tested. She wants to know what is going on since she now feels like she needs to pee all the time and she is now having back pain with this.   Pt is now scheduled for 12:30pm on Thursday. Pt stated understanding and we will see her then.

## 2020-10-03 ENCOUNTER — Encounter: Payer: Self-pay | Admitting: Family Medicine

## 2020-10-03 ENCOUNTER — Other Ambulatory Visit: Payer: Self-pay

## 2020-10-03 ENCOUNTER — Ambulatory Visit (INDEPENDENT_AMBULATORY_CARE_PROVIDER_SITE_OTHER): Payer: Medicare Other | Admitting: Family Medicine

## 2020-10-03 VITALS — BP 102/60 | HR 62 | Temp 98.3°F | Ht 62.0 in | Wt 178.0 lb

## 2020-10-03 DIAGNOSIS — R5383 Other fatigue: Secondary | ICD-10-CM

## 2020-10-03 DIAGNOSIS — R3129 Other microscopic hematuria: Secondary | ICD-10-CM

## 2020-10-03 DIAGNOSIS — R300 Dysuria: Secondary | ICD-10-CM | POA: Diagnosis not present

## 2020-10-03 LAB — URINALYSIS, ROUTINE W REFLEX MICROSCOPIC
Bilirubin Urine: NEGATIVE
Glucose, UA: NEGATIVE
Ketones, ur: NEGATIVE
Nitrite: NEGATIVE
Specific Gravity, Urine: 1.025 (ref 1.001–1.03)
pH: 6 (ref 5.0–8.0)

## 2020-10-03 LAB — MICROSCOPIC MESSAGE

## 2020-10-03 MED ORDER — ALBUTEROL SULFATE HFA 108 (90 BASE) MCG/ACT IN AERS: 2.0000 | INHALATION_SPRAY | Freq: Four times a day (QID) | RESPIRATORY_TRACT | 2 refills | Status: DC | PRN

## 2020-10-03 MED ORDER — CIPROFLOXACIN HCL 500 MG PO TABS: 500.0000 mg | ORAL_TABLET | Freq: Two times a day (BID) | ORAL | 0 refills | Status: DC

## 2020-10-03 NOTE — Progress Notes (Signed)
Subjective:    Patient ID: Alexa Orozco, female    DOB: Dec 13, 1945, 75 y.o.   MRN: [id number]  HPI Patient is a very pleasant 75 year old Caucasian female who presents today complaining of dysuria.  I saw the patient as a telephone visit about 3 weeks ago.  At that time she had symptoms consistent with a urinary tract infection and although we cannot check her urine over the telephone, I called out Macrobid for 7 days to treat a urinary tract infection.  She called back after having completed the Macrobid stating that the symptoms were not any better and therefore I called out Bactrim for potential resistant infection.  She states that she continues to have the same symptoms.  She has now developed bilateral lower back pain.  The pain is a constant pain just above her gluteus muscles bilaterally.  There is no radiation.  She also reports suprapubic discomfort and dysuria and frequency.  Urinalysis today shows +3 blood, +2 protein, negative nitrite, +1 leukocyte esterase.  She denies any fevers or chills or nausea or vomiting but she does report fatigue Past Medical History:  Diagnosis Date  . Allergies   . Allergy   . Angina pectoris, unspecified ([place])   . Asthma   . CAD S/P percutaneous coronary angioplasty February 2007   Class III Angina --> Cath: 85% -- PCI 3.10mm x 51mm (4.0 mm) Cypher DES to RCA; Myoview October 2014: normal stress test: LOW RISK; mild anteroapical breast attenuation  . Cataract    h/o bilat repair  . Claudication ([place]) 06/01/2013  . Depression   . Depression 11/29/2013  . Dyslipidemia   . Emphysema of lung ([place])   . GERD (gastroesophageal reflux disease)   . History of palpitations   . History of tobacco abuse   . Hyperlipidemia   . Hypertension    well-controlled  . Hypothyroidism   . Osteoporosis   . Raynaud disease    Past Surgical History:  Procedure Laterality Date  . ABDOMINAL HYSTERECTOMY     h/o partila hysterectomy  . BREAST BIOPSY  1988  . CORONARY  ANGIOPLASTY WITH STENT PLACEMENT  10/14/2005   cypher DES (3.18mmx18mm) to high grade RCA lesion  . NM MYOVIEW LTD  July 2012 of October 2014   '12: dipyridamole; Normal, low risk study; 2014 - normal stress test: LOW RISK; mild anteroapical breast attenuation  . TONSILLECTOMY  1959  . TRANSTHORACIC ECHOCARDIOGRAM  05/22/2009   EF=>55%, normal LV systolic function; normal RV systolic function; mild MR/TR   Current Outpatient Medications on File Prior to Visit  Medication Sig Dispense Refill  . albuterol (PROVENTIL) (2.5 MG/3ML) 0.083% nebulizer solution Take 3 mLs (2.5 mg total) by nebulization every 6 (six) hours as needed for wheezing or shortness of breath. 150 mL 1  . albuterol (VENTOLIN HFA) 108 (90 Base) MCG/ACT inhaler INHALE 2 PUFFS INTO THE LUNGS EVERY 4 (FOUR) HOURS AS NEEDED FOR WHEEZING OR SHORTNESS OF BREATH. 18 each 11  . clonazePAM (KLONOPIN) 0.5 MG tablet Take 0.5-1 tablets (0.25-0.5 mg total) by mouth daily as needed for anxiety. 60 tablet 1  . clopidogrel (PLAVIX) 75 MG tablet TAKE 1 TABLET BY MOUTH EVERY DAY 90 tablet 2  . cromolyn (OPTICROM) 4 % ophthalmic solution Place 1 drop into both eyes 4 (four) times daily.    . Eszopiclone 3 MG TABS TAKE 1 TABLET BY MOUTH IMMEDIATELY BEFORE BEDTIME 30 tablet 3  . levothyroxine (SYNTHROID) 88 MCG tablet Take 1 tablet (88 mcg total)  by mouth daily. 90 tablet 3  . montelukast (SINGULAIR) 10 MG tablet TAKE 1 TABLET BY MOUTH EVERYDAY AT BEDTIME 90 tablet 1  . nitroGLYCERIN (NITROSTAT) 0.4 MG SL tablet Place 1 tablet (0.4 mg total) under the tongue every 5 (five) minutes as needed for chest pain. 25 tablet 6  . ondansetron (ZOFRAN) 4 MG tablet Take 1 tablet (4 mg total) by mouth every 8 (eight) hours as needed for nausea or vomiting. 20 tablet 0  . pantoprazole (PROTONIX) 40 MG tablet Take 1 tablet (40 mg total) by mouth daily. 90 tablet 1  . pravastatin (PRAVACHOL) 20 MG tablet TAKE 1 TABLET BY MOUTH EVERY DAY 90 tablet 3  . sertraline  (ZOLOFT) 100 MG tablet TAKE 1 TABLET BY MOUTH EVERY DAY 90 tablet 2  . SPIRIVA RESPIMAT 2.5 MCG/ACT AERS INHALE 2 PUFFS BY MOUTH INTO THE LUNGS DAILY 12 each 3  . tiZANidine (ZANAFLEX) 4 MG tablet Take 1 tablet (4 mg total) by mouth every 6 (six) hours as needed for muscle spasms. 30 tablet 2  . triamcinolone cream (KENALOG) 0.1 % Apply 1 application topically 2 (two) times daily. (Patient not taking: Reported on 10/03/2020) 30 g 0   No current facility-administered medications on file prior to visit.   Allergies  Allergen Reactions  . Bystolic [Nebivolol Hcl] Hives  . Cephalexin Hives  . Effexor [Venlafaxine] Other (See Comments)    Made her face hot and red  . Levaquin [Levofloxacin In D5w] Other (See Comments)    Caused face to turn red and patient became extremely hot    Social History   Socioeconomic History  . Marital status: Widowed    Spouse name: Not on file  . Number of children: 3  . Years of education: 10th   . Highest education level: Not on file  Occupational History  . Occupation: Retired  Tobacco Use  . Smoking status: Current Every Day Smoker    Packs/day: 0.50    Years: 35.00    Pack years: 17.50    Types: Cigarettes  . Smokeless tobacco: Never Used  . Tobacco comment: 11/11/18 still at 1/2ppd  Vaping Use  . Vaping Use: Never used  Substance and Sexual Activity  . Alcohol use: No  . Drug use: No  . Sexual activity: Not on file  Other Topics Concern  . Not on file  Social History Narrative   She is a widowed mother of 51, grandmother for, great-grandmother 76.  She still smokes about half pack a day.  She doesn't have about 35 years.  She does not drink.   Prior to her symptoms coming on, she used to do all kind of walking around and working in the garden and other activities with her close friend.   Social Determinants of Health   Financial Resource Strain: Not on file  Food Insecurity: Not on file  Transportation Needs: Not on file  Physical Activity:  Not on file  Stress: Not on file  Social Connections: Not on file  Intimate Partner Violence: Not on file      Review of Systems  All other systems reviewed and are negative.      Objective:   Physical Exam Vitals reviewed.  Constitutional:      General: She is not in acute distress.    Appearance: Normal appearance. She is normal weight. She is not ill-appearing.  Cardiovascular:     Rate and Rhythm: Normal rate and regular rhythm.     Heart sounds:  Normal heart sounds.  Pulmonary:     Effort: Pulmonary effort is normal.     Breath sounds: Normal breath sounds.  Abdominal:     General: Bowel sounds are normal.     Palpations: Abdomen is soft.     Tenderness: There is abdominal tenderness in the suprapubic area. There is no right CVA tenderness or left CVA tenderness.  Neurological:     Mental Status: She is alert.           Assessment & Plan:  Dysuria - Plan: Urinalysis, Routine w reflex microscopic, Urine Culture, BASIC METABOLIC PANEL WITH GFR  Other microscopic hematuria  Fatigue, unspecified type   Patient has hematuria.  She also has leukocyte esterase and protein.  I will check CMP to evaluate her renal function to rule out glomerulonephritis particular given her fatigue.  Send a urine culture.  Meanwhile try Cipro 500 mg p.o. twice daily for 7 days while awaiting the urine culture.  If the urine culture shows no evidence of infection and her renal function is normal as it was in October, I would obtain a CT scan for hematuria and also urology consultation for cystoscopy.

## 2020-10-04 ENCOUNTER — Telehealth: Payer: Self-pay

## 2020-10-04 LAB — BASIC METABOLIC PANEL WITH GFR
BUN/Creatinine Ratio: 26 (calc) — ABNORMAL HIGH (ref 6–22)
BUN: 25 mg/dL (ref 7–25)
CO2: 19 mmol/L — ABNORMAL LOW (ref 20–32)
Calcium: 9.4 mg/dL (ref 8.6–10.4)
Chloride: 108 mmol/L (ref 98–110)
Creat: 0.96 mg/dL — ABNORMAL HIGH (ref 0.60–0.93)
GFR, Est African American: 68 mL/min/{1.73_m2} (ref >=60)
GFR, Est Non African American: 59 mL/min/{1.73_m2} — ABNORMAL LOW (ref >=60)
Glucose, Bld: 67 mg/dL (ref 65–99)
Potassium: 5 mmol/L (ref 3.5–5.3)
Sodium: 142 mmol/L (ref 135–146)

## 2020-10-04 LAB — URINE CULTURE
MICRO NUMBER:: 11491747
Result:: NO GROWTH
SPECIMEN QUALITY:: ADEQUATE

## 2020-10-04 NOTE — Telephone Encounter (Signed)
Pt  Called back to get lab results. I have given them to her and she stated understanding.

## 2020-10-07 ENCOUNTER — Other Ambulatory Visit: Payer: Self-pay | Admitting: Family Medicine

## 2020-10-07 DIAGNOSIS — R319 Hematuria, unspecified: Secondary | ICD-10-CM

## 2020-10-10 ENCOUNTER — Telehealth: Payer: Self-pay

## 2020-10-10 NOTE — Telephone Encounter (Signed)
Patient doctors office called (Dr. Dennard Schaumann) stating patients daughter called that the patient is in 10/10 pain in her abdomen with nausea, gross hematuria and weight loss. Wanted appointment to be moved up. Pt has new pt appointment on 10/14/2020 for hematuria.. As Per Dr Erlene Quan, advised pt to come in today to see Dr. Erlene Quan at 10:45 am. PT dr office stated the daughter can not bring her today. Was told to keep appointment for Monday.

## 2020-10-11 ENCOUNTER — Telehealth: Payer: Self-pay | Admitting: Family Medicine

## 2020-10-11 ENCOUNTER — Other Ambulatory Visit: Payer: Self-pay | Admitting: Family Medicine

## 2020-10-11 MED ORDER — HYDROCODONE-ACETAMINOPHEN 5-325 MG PO TABS
1.0000 | ORAL_TABLET | Freq: Four times a day (QID) | ORAL | 0 refills | Status: DC | PRN
Start: 2020-10-11 — End: 2020-11-27

## 2020-10-11 NOTE — Telephone Encounter (Signed)
Call placed to patient.   Reports that OTC Azo has not helped pain/ pressure.   Please advise.

## 2020-10-11 NOTE — Telephone Encounter (Signed)
Sent in norco

## 2020-10-11 NOTE — Telephone Encounter (Signed)
Still in pain can Dr.Pickard prescribe medication for pain please.

## 2020-10-14 ENCOUNTER — Ambulatory Visit (INDEPENDENT_AMBULATORY_CARE_PROVIDER_SITE_OTHER): Payer: Medicare Other | Admitting: Urology

## 2020-10-14 ENCOUNTER — Encounter: Payer: Self-pay | Admitting: Urology

## 2020-10-14 ENCOUNTER — Other Ambulatory Visit: Payer: Self-pay

## 2020-10-14 VITALS — BP 140/73 | HR 54 | Ht 62.0 in | Wt 170.0 lb

## 2020-10-14 DIAGNOSIS — R102 Pelvic and perineal pain: Secondary | ICD-10-CM

## 2020-10-14 DIAGNOSIS — R399 Unspecified symptoms and signs involving the genitourinary system: Secondary | ICD-10-CM | POA: Diagnosis not present

## 2020-10-14 DIAGNOSIS — R310 Gross hematuria: Secondary | ICD-10-CM | POA: Diagnosis not present

## 2020-10-14 LAB — MICROSCOPIC EXAMINATION: RBC, Urine: >30 /hpf — AB (ref 0–2)

## 2020-10-14 LAB — URINALYSIS, COMPLETE
Bilirubin, UA: NEGATIVE
Glucose, UA: NEGATIVE
Ketones, UA: NEGATIVE
Nitrite, UA: NEGATIVE
Specific Gravity, UA: >1.03 — ABNORMAL HIGH (ref 1.005–1.030)
Urobilinogen, Ur: 0.2 mg/dL (ref 0.2–1.0)
pH, UA: 6 (ref 5.0–7.5)

## 2020-10-14 LAB — BLADDER SCAN AMB NON-IMAGING

## 2020-10-14 NOTE — H&P (View-Only) (Signed)
10/14/20 4:54 PM   Alexa Orozco Alexa Orozco 22-Feb-1946 [id number]  CC: Urinary symptoms, microscopic hematuria, pelvic pain  HPI: I saw [name] today for the above issues.  She is a 75 year old female with a history of CAD on Plavix who reports 6 weeks of moderate to severe urinary symptoms with pelvic pain, dysuria, and urgency frequency.  The symptoms feel similar to UTI.  She has had multiple courses of antibiotics including nitrofurantoin and Cipro that have not improved her symptoms at all.  Dipstick urinalysis with PCP on 10/03/2020 showed 3+ hemoglobin on dip, 2+ protein, nitrite negative, 1+ leukocytes, and culture showed no growth.  She also reports 10 to 15 pounds of weight loss over the last 6 weeks.  She has an extensive smoking history with at least 50-pack-year history, and likely closer to 75.  PVR today is normal at 0 mL.  Urinalysis is concerning for infection with 11-30 WBCs, greater than 30 RBCs, 0-10 epithelial cells, calcium oxalate crystals present, many bacteria, no yeast, nitrite negative, trace leukocytes.  Will send for culture and Ureaplasma culture.   PMH: Past Medical History:  Diagnosis Date  . Allergies   . Allergy   . Angina pectoris, unspecified ([place])   . Asthma   . CAD S/P percutaneous coronary angioplasty February 2007   Class III Angina --> Cath: 85% -- PCI 3.42mm x 65mm (4.0 mm) Cypher DES to RCA; Myoview October 2014: normal stress test: LOW RISK; mild anteroapical breast attenuation  . Cataract    h/o bilat repair  . Claudication ([place]) 06/01/2013  . Depression   . Depression 11/29/2013  . Dyslipidemia   . Emphysema of lung ([place])   . GERD (gastroesophageal reflux disease)   . History of palpitations   . History of tobacco abuse   . Hyperlipidemia   . Hypertension    well-controlled  . Hypothyroidism   . Osteoporosis   . Raynaud disease     Surgical History: Past Surgical History:  Procedure Laterality Date  . ABDOMINAL HYSTERECTOMY     h/o partila  hysterectomy  . BREAST BIOPSY  1988  . CORONARY ANGIOPLASTY WITH STENT PLACEMENT  10/14/2005   cypher DES (3.86mmx18mm) to high grade RCA lesion  . NM MYOVIEW LTD  July 2012 of October 2014   '12: dipyridamole; Normal, low risk study; 2014 - normal stress test: LOW RISK; mild anteroapical breast attenuation  . TONSILLECTOMY  1959  . TRANSTHORACIC ECHOCARDIOGRAM  05/22/2009   EF=>55%, normal LV systolic function; normal RV systolic function; mild MR/TR      Family History: Family History  Problem Relation Age of Onset  . Liver cancer Mother   . Heart attack Mother   . Heart Problems Sister   . Lupus Sister   . Liver cancer Brother   . Asthma Brother   . Breast cancer Neg Hx   . Prostate cancer Neg Hx   . Bladder Cancer Neg Hx   . Kidney cancer Neg Hx     Social History:  reports that she has been smoking cigarettes. She has a 17.50 pack-year smoking history. She has never used smokeless tobacco. She reports that she does not drink alcohol and does not use drugs.  Physical Exam: BP 140/73   Pulse (!) 54   Ht 5\' 2"  (1.575 m)   Wt 170 lb (77.1 kg)   BMI 31.09 kg/m    Constitutional:  Alert and oriented, No acute distress. Cardiovascular: No clubbing, cyanosis, or edema. Respiratory: Normal respiratory effort,  no increased work of breathing. GI: Abdomen is soft, nontender, nondistended, no abdominal masses  Laboratory Data: Reviewed, see HPI  Pertinent Imaging: None to review  Assessment & Plan:   She is a 75 year old female with 6 weeks of bothersome urinary symptoms of dysuria, pelvic pain and pressure, urgency and frequency, negative urine culture 2 weeks ago, multiple courses of antibiotics without any improvement, and urinalysis with pyuria and microscopic hematuria today.  We discussed possible etiologies at length including infection, inflammation, malignancy, and I agree with further work-up with imaging and cystoscopy.  We discussed the possibility of resistant  bacteria and urine was sent for culture today including Ureaplasma.  Her extensive smoking history, weight loss, and microscopic hematuria is worrisome for possible malignancy.  CT urogram ordered, follow-up for cystoscopy We will call with culture results, send antibiotics if positive  Alexa Madrid, MD 10/14/2020  [facility] [address], [place], [place] [address] [phone]

## 2020-10-14 NOTE — Progress Notes (Signed)
10/14/20 4:54 PM   [name] Alexa Orozco Jan 22, 1946 [id number]  CC: Urinary symptoms, microscopic hematuria, pelvic pain  HPI: I saw Alexa Orozco today for the above issues.  She is a 75 year old female with a history of CAD on Plavix who reports 6 weeks of moderate to severe urinary symptoms with pelvic pain, dysuria, and urgency frequency.  The symptoms feel similar to UTI.  She has had multiple courses of antibiotics including nitrofurantoin and Cipro that have not improved her symptoms at all.  Dipstick urinalysis with PCP on 10/03/2020 showed 3+ hemoglobin on dip, 2+ protein, nitrite negative, 1+ leukocytes, and culture showed no growth.  She also reports 10 to 15 pounds of weight loss over the last 6 weeks.  She has an extensive smoking history with at least 50-pack-year history, and likely closer to 75.  PVR today is normal at 0 mL.  Urinalysis is concerning for infection with 11-30 WBCs, greater than 30 RBCs, 0-10 epithelial cells, calcium oxalate crystals present, many bacteria, no yeast, nitrite negative, trace leukocytes.  Will send for culture and Ureaplasma culture.   PMH: Past Medical History:  Diagnosis Date  . Allergies   . Allergy   . Angina pectoris, unspecified ([place])   . Asthma   . CAD S/P percutaneous coronary angioplasty February 2007   Class III Angina --> Cath: 85% -- PCI 3.1mm x 28mm (4.0 mm) Cypher DES to RCA; Myoview October 2014: normal stress test: LOW RISK; mild anteroapical breast attenuation  . Cataract    h/o bilat repair  . Claudication ([place]) 06/01/2013  . Depression   . Depression 11/29/2013  . Dyslipidemia   . Emphysema of lung ([place])   . GERD (gastroesophageal reflux disease)   . History of palpitations   . History of tobacco abuse   . Hyperlipidemia   . Hypertension    well-controlled  . Hypothyroidism   . Osteoporosis   . Raynaud disease     Surgical History: Past Surgical History:  Procedure Laterality Date  . ABDOMINAL HYSTERECTOMY     h/o partila  hysterectomy  . BREAST BIOPSY  1988  . CORONARY ANGIOPLASTY WITH STENT PLACEMENT  10/14/2005   cypher DES (3.54mmx18mm) to high grade RCA lesion  . NM MYOVIEW LTD  July 2012 of October 2014   '12: dipyridamole; Normal, low risk study; 2014 - normal stress test: LOW RISK; mild anteroapical breast attenuation  . TONSILLECTOMY  1959  . TRANSTHORACIC ECHOCARDIOGRAM  05/22/2009   EF=>55%, normal LV systolic function; normal RV systolic function; mild MR/TR      Family History: Family History  Problem Relation Age of Onset  . Liver cancer Mother   . Heart attack Mother   . Heart Problems Sister   . Lupus Sister   . Liver cancer Brother   . Asthma Brother   . Breast cancer Neg Hx   . Prostate cancer Neg Hx   . Bladder Cancer Neg Hx   . Kidney cancer Neg Hx     Social History:  reports that she has been smoking cigarettes. She has a 17.50 pack-year smoking history. She has never used smokeless tobacco. She reports that she does not drink alcohol and does not use drugs.  Physical Exam: BP 140/73   Pulse (!) 54   Ht 5\' 2"  (1.575 m)   Wt 170 lb (77.1 kg)   BMI 31.09 kg/m    Constitutional:  Alert and oriented, No acute distress. Cardiovascular: No clubbing, cyanosis, or edema. Respiratory: Normal respiratory effort,  no increased work of breathing. GI: Abdomen is soft, nontender, nondistended, no abdominal masses  Laboratory Data: Reviewed, see HPI  Pertinent Imaging: None to review  Assessment & Plan:   She is a 75 year old female with 6 weeks of bothersome urinary symptoms of dysuria, pelvic pain and pressure, urgency and frequency, negative urine culture 2 weeks ago, multiple courses of antibiotics without any improvement, and urinalysis with pyuria and microscopic hematuria today.  We discussed possible etiologies at length including infection, inflammation, malignancy, and I agree with further work-up with imaging and cystoscopy.  We discussed the possibility of resistant  bacteria and urine was sent for culture today including Ureaplasma.  Her extensive smoking history, weight loss, and microscopic hematuria is worrisome for possible malignancy.  CT urogram ordered, follow-up for cystoscopy We will call with culture results, send antibiotics if positive  [name], MD 10/14/2020  [facility] [address] [place], [place] [address] [phone]

## 2020-10-14 NOTE — Patient Instructions (Signed)
Cystoscopy Cystoscopy is a procedure that is used to help diagnose and sometimes treat conditions that affect the lower urinary tract. The lower urinary tract includes the bladder and the urethra. The urethra is the tube that drains urine from the bladder. Cystoscopy is done using a thin, tube-shaped instrument with a light and camera at the end (cystoscope). The cystoscope may be hard or flexible, depending on the goal of the procedure. The cystoscope is inserted through the urethra, into the bladder. Cystoscopy may be recommended if you have:  Urinary tract infections that keep coming back.  Blood in the urine (hematuria).  An inability to control when you urinate (urinary incontinence) or an overactive bladder.  Unusual cells found in a urine sample.  A blockage in the urethra, such as a urinary stone.  Painful urination.  An abnormality in the bladder found during an intravenous pyelogram (IVP) or CT scan. Cystoscopy may also be done to remove a sample of tissue to be examined under a microscope (biopsy). What are the risks? Generally, this is a safe procedure. However, problems may occur, including:  Infection.  Bleeding.  What happens during the procedure?  1. You will be given one or more of the following: ? A medicine to numb the area (local anesthetic). 2. The area around the opening of your urethra will be cleaned. 3. The cystoscope will be passed through your urethra into your bladder. 4. Germ-free (sterile) fluid will flow through the cystoscope to fill your bladder. The fluid will stretch your bladder so that your health care provider can clearly examine your bladder walls. 5. Your doctor will look at the urethra and bladder. 6. The cystoscope will be removed The procedure may vary among health care providers  What can I expect after the procedure? After the procedure, it is common to have: 1. Some soreness or pain in your abdomen and urethra. 2. Urinary symptoms.  These include: ? Mild pain or burning when you urinate. Pain should stop within a few minutes after you urinate. This may last for up to 1 week. ? A small amount of blood in your urine for several days. ? Feeling like you need to urinate but producing only a small amount of urine. Follow these instructions at home: General instructions  Return to your normal activities as told by your health care provider.   Do not drive for 24 hours if you were given a sedative during your procedure.  Watch for any blood in your urine. If the amount of blood in your urine increases, call your health care provider.  If a tissue sample was removed for testing (biopsy) during your procedure, it is up to you to get your test results. Ask your health care provider, or the department that is doing the test, when your results will be ready.  Drink enough fluid to keep your urine pale yellow.  Keep all follow-up visits as told by your health care provider. This is important. Contact a health care provider if you:  Have pain that gets worse or does not get better with medicine, especially pain when you urinate.  Have trouble urinating.  Have more blood in your urine. Get help right away if you:  Have blood clots in your urine.  Have abdominal pain.  Have a fever or chills.  Are unable to urinate. Summary  Cystoscopy is a procedure that is used to help diagnose and sometimes treat conditions that affect the lower urinary tract.  Cystoscopy is done using   a thin, tube-shaped instrument with a light and camera at the end.  After the procedure, it is common to have some soreness or pain in your abdomen and urethra.  Watch for any blood in your urine. If the amount of blood in your urine increases, call your health care provider.  If you were prescribed an antibiotic medicine, take it as told by your health care provider. Do not stop taking the antibiotic even if you start to feel better. This  information is not intended to replace advice given to you by your health care provider. Make sure you discuss any questions you have with your health care provider. Document Revised: 08/09/2018 Document Reviewed: 08/09/2018 Elsevier Patient Education  2020 [employer]   

## 2020-10-15 ENCOUNTER — Telehealth: Payer: Self-pay | Admitting: Family Medicine

## 2020-10-15 ENCOUNTER — Telehealth: Payer: Self-pay | Admitting: Urology

## 2020-10-15 NOTE — Telephone Encounter (Signed)
Sure, fine with me.

## 2020-10-15 NOTE — Telephone Encounter (Signed)
-----   Message from Billey Co, MD sent at 10/14/2020  4:59 PM EST ----- Regarding: change CT I changed her CT that was ordered by PCP and scheduled for 2/23 to a CT hematuria.  They would prefer [facility], can you help facilitate, thanks.  Okay to move up the CT if needed, otherwise should be able to just keep that appointment and change to a CT hematuria and canceled the CT abdomen pelvis with contrast ordered by PCP  [name], MD 10/14/2020

## 2020-10-15 NOTE — Telephone Encounter (Signed)
done

## 2020-10-15 NOTE — Telephone Encounter (Signed)
FYISharyn Lull from [facility] called. She states that she sees where we have ordered a CT Abd and Pelvis with contrast Dr. Chrystine Oiler would like to change it to CT Abd and Pelvis with and without contrast. She states that per the hematuria protocol is for the with and without contrast.   Patient is already scheduled with [facility] she is going to reach out to them and see if they can keep the same date and time for patient.   She just didn't want to change it without our office knowing why it was changed.  CB# [phone]

## 2020-10-17 LAB — CULTURE, URINE COMPREHENSIVE

## 2020-10-21 LAB — MYCOPLASMA / UREAPLASMA CULTURE
Mycoplasma hominis Culture: NEGATIVE
Ureaplasma urealyticum: NEGATIVE

## 2020-10-23 ENCOUNTER — Ambulatory Visit
Admission: RE | Admit: 2020-10-23 | Discharge: 2020-10-23 | Disposition: A | Discharge status: Home / Self Care | Payer: Medicare Other | Source: Ambulatory Visit | Attending: Urology | Admitting: Urology

## 2020-10-23 ENCOUNTER — Other Ambulatory Visit: Payer: Medicare Other

## 2020-10-23 DIAGNOSIS — R310 Gross hematuria: Secondary | ICD-10-CM

## 2020-10-23 MED ORDER — IOPAMIDOL (ISOVUE-300) INJECTION 61%
125.0000 mL | Freq: Once | INTRAVENOUS | Status: AC | PRN
  Administered 2020-10-23: 125 mL via INTRAVENOUS

## 2020-10-24 ENCOUNTER — Ambulatory Visit (INDEPENDENT_AMBULATORY_CARE_PROVIDER_SITE_OTHER): Payer: Medicare Other | Admitting: Urology

## 2020-10-24 ENCOUNTER — Other Ambulatory Visit: Payer: Self-pay

## 2020-10-24 ENCOUNTER — Encounter: Payer: Self-pay | Admitting: Urology

## 2020-10-24 VITALS — BP 160/95 | HR 58 | Ht 62.0 in | Wt 170.0 lb

## 2020-10-24 DIAGNOSIS — D494 Neoplasm of unspecified behavior of bladder: Secondary | ICD-10-CM

## 2020-10-24 NOTE — Progress Notes (Signed)
   10/24/2020 2:33 PM   [name] Alexa Orozco 09/03/1945 [id number]  Reason for visit: Follow up gross hematuria, review CT results  HPI: I saw Alexa Orozco and her daughter in urology clinic for the above issues.  She was originally scheduled for a cystoscopy today to complete gross hematuria work-up, however with her CT findings of an obvious 5 cm large bladder tumor I canceled cystoscopy and we discussed these findings.  Briefly, 75 year old female with extensive 75+ pack year smoking history, CAD on anticoagulation with Plavix, who presented with microscopic hematuria, pelvic pain, and urinary symptoms of urgency and frequency despite multiple negative cultures.  I personally viewed and interpreted the CT urogram that shows extensive right-sided bladder tumor with no clear evidence of hydronephrosis or any lymphadenopathy, however highly suspicious for local extension outside the bladder on the right side.  I had a very long conversation with the patient and her daughter about these findings, the need for TURBT for further diagnosis and treatment.  I also counseled her extensively to quit or cut back on smoking to decrease the risks of anesthesia during the procedure, as well as the risk of recurrence.  We discussed transurethral resection of bladder tumor (TURBT) and risks and benefits at length. This is typically a 1 to 2-hour procedure done under general anesthesia in the operating room.  A scope is inserted through the urethra and used to resect abnormal tissue within the bladder, which is then sent to the pathologist to determine grade and stage of the tumor.  Risks include bleeding, infection, need for temporary Foley placement, and bladder perforation.  Treatment strategies are based on the type of tumor and depth of invasion.  We reviewed the different treatment pathways for non-muscle invasive and muscle invasive bladder cancer.  Will ultimately need dedicated chest imaging to complete staging Cardiac  clearance to hold Plavix Schedule TURBT next week  Billey Co, MD  [facility] [address] [place], [place] [address] [phone]

## 2020-10-24 NOTE — Patient Instructions (Addendum)
Transurethral Resection of Bladder Tumor  Transurethral resection of a bladder tumor is the removal (resection) of a cancerous growth (tumor) on the inside wall of the bladder. The bladder is the organ that holds urine. The tumor is removed through the tube that carries urine out of the body (urethra). In a transurethral resection, a thin telescope with a light, a tiny camera, and an electric cutting edge (resectoscope) is passed through the urethra. In men, the opening of the urethra is at the end of the penis. In women, it is just above the opening of the vagina. Tell a health care provider about:  Any allergies you have.  All medicines you are taking, including vitamins, herbs, eye drops, creams, and over-the-counter medicines.  Any problems you or family members have had with anesthetic medicines.  Any blood disorders you have.  Any surgeries you have had.  Any medical conditions you have.  Any recent urinary tract infections you have had.  Whether you are pregnant or may be pregnant. What are the risks? Generally, this is a safe procedure. However, problems may occur, including:  Infection.  Bleeding.  Allergic reactions to medicines.  Damage to nearby structures or organs, such as: ? The urethra. ? The tubes that drain urine from the kidneys into the bladder (ureters).  Pain and burning during urination.  Difficulty urinating due to partial blockage of the urethra.  Inability to urinate (urinary retention). What happens before the procedure? Staying hydrated Follow instructions from your health care provider about hydration, which may include:  Up to 2 hours before the procedure - you may continue to drink clear liquids, such as water, clear fruit juice, black coffee, and plain tea.   Eating and drinking restrictions Follow instructions from your health care provider about eating and drinking, which may include:  8 hours before the procedure - stop eating heavy  meals or foods, such as meat, fried foods, or fatty foods.  6 hours before the procedure - stop eating light meals or foods, such as toast or cereal.  6 hours before the procedure - stop drinking milk or drinks that contain milk.  2 hours before the procedure - stop drinking clear liquids. Medicines Ask your health care provider about:  Changing or stopping your regular medicines. This is especially important if you are taking diabetes medicines or blood thinners.  Taking medicines such as aspirin and ibuprofen. These medicines can thin your blood. Do not take these medicines unless your health care provider tells you to take them.  Taking over-the-counter medicines, vitamins, herbs, and supplements. Tests You may have exams or tests, including:  Physical exam.  Blood tests.  Urine tests.  Electrocardiogram (ECG). This test measures the electrical activity of the heart. General instructions  Plan to have someone take you home from the hospital or clinic.  Ask your health care provider how your surgical site will be marked or identified.  Ask your health care provider what steps will be taken to help prevent infection. These may include: ? Washing skin with a germ-killing soap. ? Taking antibiotic medicine. What happens during the procedure?  An IV will be inserted into one of your veins.  You will be given one or more of the following: ? A medicine to help you relax (sedative). ? A medicine to make you fall asleep (general anesthetic). ? A medicine that is injected into your spine to numb the area below and slightly above the injection site (spinal anesthetic).  Your legs will   be placed in foot rests (stirrups) so that your legs are apart and your knees are bent.  The resectoscope will be passed through your urethra and into your bladder.  The part of your bladder that is affected by the tumor will be resected using the cutting edge of the resectoscope.  The  resectoscope will be removed.  A thin, flexible tube (catheter) will be passed through your urethra and into your bladder. The catheter will drain urine into a bag outside of your body. ? Fluid may be passed through the catheter to keep the catheter open. The procedure may vary among health care providers and hospitals. What happens after the procedure?  Your blood pressure, heart rate, breathing rate, and blood oxygen level will be monitored until you leave the hospital or clinic.  You may continue to receive fluids and medicines through an IV.  You will have some pain. You will be given pain medicine to relieve pain.  You will have a catheter to drain your urine. ? You will have blood in your urine. Your catheter may be kept in until your urine is clear. ? The amount of urine will be monitored. If necessary, your bladder may be rinsed out (irrigated) by passing fluid through your catheter.  You will be encouraged to walk around as soon as possible.  You may have to wear compression stockings. These stockings help to prevent blood clots and reduce swelling in your legs.  Do not drive for 24 hours if you were given a sedative during your procedure. Summary  Transurethral resection of a bladder tumor is the removal (resection) of a cancerous growth (tumor) on the inside wall of the bladder.  To do this procedure, your health care provider uses a thin telescope with a light, a tiny camera, and an electric cutting edge (resectoscope).  Follow your health care provider's instructions. You may need to stop or change certain medicines, and you may be told to stop eating and drinking several hours before the procedure.  Your blood pressure, heart rate, breathing rate, and blood oxygen level will be monitored until you leave the hospital or clinic.  You may have to wear compression stockings. These stockings help to prevent blood clots and reduce swelling in your legs. This information is  not intended to replace advice given to you by your health care provider. Make sure you discuss any questions you have with your health care provider. Document Revised: 03/18/2018 Document Reviewed: 03/18/2018 Elsevier Patient Education  2021 Elsevier [employer]    Transurethral Resection of Bladder Tumor, Care After This sheet gives you information about how to care for yourself after your procedure. Your health care provider may also give you more specific instructions. If you have problems or questions, contact your health care provider. What can I expect after the procedure? After the procedure, it is common to have:  A small amount of blood in your urine for up to 2 weeks.  Soreness or mild pain from your catheter. After your catheter is removed, you may have mild soreness, especially when urinating.  Pain in your lower abdomen. Follow these instructions at home: Medicines  Take over-the-counter and prescription medicines only as told by your health care provider.  If you were prescribed an antibiotic medicine, take it as told by your health care provider. Do not stop taking the antibiotic even if you start to feel better.  Do not drive for 24 hours if you were given a sedative during your procedure.    Ask your health care provider if the medicine prescribed to you: ? Requires you to avoid driving or using heavy machinery. ? Can cause constipation. You may need to take these actions to prevent or treat constipation:  Take over-the-counter or prescription medicines.  Eat foods that are high in fiber, such as beans, whole grains, and fresh fruits and vegetables.  Limit foods that are high in fat and processed sugars, such as fried or sweet foods.   Activity  Return to your normal activities as told by your health care provider. Ask your health care provider what activities are safe for you.  Do not lift anything that is heavier than 10 lb (4.5 kg), or the limit that you are told,  until your health care provider says that it is safe.  Avoid intense physical activity for as long as told by your health care provider.  Rest as told by your health care provider.  Avoid sitting for a long time without moving. Get up to take short walks every 1-2 hours. This is important to improve blood flow and breathing. Ask for help if you feel weak or unsteady. General instructions  Do not drink alcohol for as long as told by your health care provider. This is especially important if you are taking prescription pain medicines.  Do not take baths, swim, or use a hot tub until your health care provider approves. Ask your health care provider if you may take showers. You may only be allowed to take sponge baths.  If you have a catheter, follow instructions from your health care provider about caring for your catheter and your drainage bag.  Drink enough fluid to keep your urine pale yellow.  Wear compression stockings as told by your health care provider. These stockings help to prevent blood clots and reduce swelling in your legs.  Keep all follow-up visits as told by your health care provider. This is important. ? You will need to be followed closely with regular checks of your bladder and urethra (cystoscopies) to make sure that the cancer does not come back.   Contact a health care provider if:  You have pain that gets worse or does not improve with medicine.  You have blood in your urine for more than 2 weeks.  You have cloudy or bad-smelling urine.  You become constipated. Signs of constipation may include having: ? Fewer than three bowel movements in a week. ? Difficulty having a bowel movement. ? Stools that are dry, hard, or larger than normal.  You have a fever. Get help right away if:  You have: ? Severe pain. ? Bright red blood in your urine. ? Blood clots in your urine. ? A lot of blood in your urine.  Your catheter has been removed and you are not able to  urinate.  You have a catheter in place and the catheter is not draining urine. Summary  After your procedure, it is common to have a small amount of blood in your urine, soreness or mild pain from your catheter, and pain in your lower abdomen.  Take over-the-counter and prescription medicines only as told by your health care provider.  Rest as told by your health care provider. Follow your health care provider's instructions about returning to normal activities. Ask what activities are safe for you.  If you have a catheter, follow instructions from your health care provider about caring for your catheter and your drainage bag.  Get help right away if you cannot   urinate, you have severe pain, or you have bright red blood or blood clots in your urine. This information is not intended to replace advice given to you by your health care provider. Make sure you discuss any questions you have with your health care provider. Document Revised: 03/17/2018 Document Reviewed: 03/17/2018 Elsevier Patient Education  2021 Elsevier Inc.  

## 2020-10-25 ENCOUNTER — Other Ambulatory Visit: Payer: Self-pay | Admitting: Urology

## 2020-10-25 ENCOUNTER — Telehealth: Payer: Self-pay

## 2020-10-25 NOTE — Telephone Encounter (Signed)
   [facility] Pre-operative Risk Assessment    Request for surgical clearance:  1. What type of surgery is being performed Transurethral resection bladder tumor  2. When is this surgery scheduled 11/01/20  3. What type of clearance is required Both  4. Are there any medications that need to be held prior to surgery and how long  Plavix hold 5 to 7 days prior to surgery  5. Practice name and name of physician performing surgery [facility]    6. What is the office phone number [phone]   7.   What is the office fax number [phone]  8.   Anesthesia type  General   Kathyrn Lass 10/25/2020, 12:28 PM  _________________________________________________________________   (provider comments below)

## 2020-10-25 NOTE — Telephone Encounter (Signed)
   Primary Cardiologist: Glenetta Hew, MD  Chart reviewed as part of pre-operative protocol coverage. Patient was contacted 10/25/2020 in reference to pre-operative risk assessment for pending surgery as outlined below.  Alexa Orozco was last seen on 12/25/2019 by Dr. Ellyn Hack.  Since that day, Alexa Orozco has done well without any chest pain or shortness of breath on exertion.  She is able to accomplish more than 4 METS of activity if needs to be.  Activity is mainly limited by fatigue, however no chest discomfort.  Patient is cleared to hold Plavix for 5 to 7 days prior to the surgery and restart as soon as possible after the procedure at the surgeon's discretion.  Therefore, based on ACC/AHA guidelines, the patient would be at acceptable risk for the planned procedure without further cardiovascular testing.   The patient was advised that if she develops new symptoms prior to surgery to contact our office to arrange for a follow-up visit, and she verbalized understanding.  I will route this recommendation to the requesting party via Epic fax function and remove from pre-op pool. Please call with questions.  [place], [place] 10/25/2020, 12:42 PM

## 2020-10-30 ENCOUNTER — Other Ambulatory Visit: Admission: RE | Admit: 2020-10-30 | Payer: Medicare Other | Source: Ambulatory Visit

## 2020-11-06 ENCOUNTER — Other Ambulatory Visit
Admission: RE | Admit: 2020-11-06 | Discharge: 2020-11-06 | Disposition: A | Discharge status: Home / Self Care | Payer: Medicare Other | Source: Ambulatory Visit | Attending: Urology | Admitting: Urology

## 2020-11-06 ENCOUNTER — Other Ambulatory Visit: Payer: Self-pay

## 2020-11-06 DIAGNOSIS — Z20822 Contact with and (suspected) exposure to covid-19: Secondary | ICD-10-CM | POA: Diagnosis not present

## 2020-11-06 DIAGNOSIS — Z01812 Encounter for preprocedural laboratory examination: Secondary | ICD-10-CM | POA: Diagnosis not present

## 2020-11-06 LAB — SARS CORONAVIRUS 2 (TAT 6-24 HRS): SARS Coronavirus 2: NEGATIVE

## 2020-11-08 ENCOUNTER — Ambulatory Visit
Admission: RE | Admit: 2020-11-08 | Discharge: 2020-11-08 | Disposition: A | Discharge status: Home / Self Care | Payer: Medicare Other | Attending: Urology | Admitting: Urology

## 2020-11-08 ENCOUNTER — Other Ambulatory Visit: Payer: Self-pay | Admitting: Urology

## 2020-11-08 ENCOUNTER — Ambulatory Visit: Payer: Medicare Other | Admitting: Certified Registered Nurse Anesthetist

## 2020-11-08 ENCOUNTER — Other Ambulatory Visit: Payer: Self-pay

## 2020-11-08 ENCOUNTER — Encounter
Admission: RE | Disposition: A | Discharge status: Home / Self Care | Payer: Self-pay | Source: Home / Self Care | Attending: Urology

## 2020-11-08 ENCOUNTER — Encounter: Payer: Self-pay | Admitting: Urology

## 2020-11-08 DIAGNOSIS — E785 Hyperlipidemia, unspecified: Secondary | ICD-10-CM | POA: Diagnosis not present

## 2020-11-08 DIAGNOSIS — C678 Malignant neoplasm of overlapping sites of bladder: Secondary | ICD-10-CM | POA: Diagnosis not present

## 2020-11-08 DIAGNOSIS — J449 Chronic obstructive pulmonary disease, unspecified: Secondary | ICD-10-CM | POA: Diagnosis not present

## 2020-11-08 DIAGNOSIS — R1084 Generalized abdominal pain: Secondary | ICD-10-CM | POA: Diagnosis not present

## 2020-11-08 DIAGNOSIS — C679 Malignant neoplasm of bladder, unspecified: Secondary | ICD-10-CM | POA: Diagnosis not present

## 2020-11-08 DIAGNOSIS — F418 Other specified anxiety disorders: Secondary | ICD-10-CM | POA: Diagnosis not present

## 2020-11-08 DIAGNOSIS — Z7902 Long term (current) use of antithrombotics/antiplatelets: Secondary | ICD-10-CM | POA: Insufficient documentation

## 2020-11-08 DIAGNOSIS — I10 Essential (primary) hypertension: Secondary | ICD-10-CM | POA: Diagnosis not present

## 2020-11-08 DIAGNOSIS — D494 Neoplasm of unspecified behavior of bladder: Secondary | ICD-10-CM | POA: Diagnosis not present

## 2020-11-08 DIAGNOSIS — Z955 Presence of coronary angioplasty implant and graft: Secondary | ICD-10-CM | POA: Insufficient documentation

## 2020-11-08 DIAGNOSIS — F17210 Nicotine dependence, cigarettes, uncomplicated: Secondary | ICD-10-CM | POA: Diagnosis not present

## 2020-11-08 DIAGNOSIS — I2510 Atherosclerotic heart disease of native coronary artery without angina pectoris: Secondary | ICD-10-CM | POA: Insufficient documentation

## 2020-11-08 HISTORY — PX: TRANSURETHRAL RESECTION OF BLADDER TUMOR: SHX2575

## 2020-11-08 SURGERY — TURBT (TRANSURETHRAL RESECTION OF BLADDER TUMOR)
Anesthesia: General

## 2020-11-08 MED ORDER — LIDOCAINE HCL (CARDIAC) PF 100 MG/5ML IV SOSY
PREFILLED_SYRINGE | INTRAVENOUS | Status: DC | PRN
  Administered 2020-11-08: 100 mg via INTRAVENOUS

## 2020-11-08 MED ORDER — ORAL CARE MOUTH RINSE: 15.0000 mL | Freq: Once | OROMUCOSAL | Status: AC

## 2020-11-08 MED ORDER — SULFAMETHOXAZOLE-TRIMETHOPRIM 800-160 MG PO TABS
1.0000 | ORAL_TABLET | Freq: Once | ORAL | Status: AC
  Administered 2020-11-08: 1 via ORAL
  Filled 2020-11-08: qty 1

## 2020-11-08 MED ORDER — FENTANYL CITRATE (PF) 100 MCG/2ML IJ SOLN
25.0000 ug | INTRAMUSCULAR | Status: DC | PRN
  Administered 2020-11-08: 25 ug via INTRAVENOUS

## 2020-11-08 MED ORDER — DEXAMETHASONE SODIUM PHOSPHATE 10 MG/ML IJ SOLN
INTRAMUSCULAR | Status: DC | PRN
  Administered 2020-11-08: 5 mg via INTRAVENOUS

## 2020-11-08 MED ORDER — OXYCODONE HCL 5 MG PO TABS: 5.0000 mg | ORAL_TABLET | Freq: Four times a day (QID) | ORAL | 0 refills | Status: AC | PRN

## 2020-11-08 MED ORDER — LACTATED RINGERS IV SOLN: INTRAVENOUS | Status: DC

## 2020-11-08 MED ORDER — EPHEDRINE SULFATE 50 MG/ML IJ SOLN
INTRAMUSCULAR | Status: DC | PRN
  Administered 2020-11-08 (×5): 5 mg via INTRAVENOUS
  Administered 2020-11-08: 10 mg via INTRAVENOUS
  Administered 2020-11-08 (×2): 5 mg via INTRAVENOUS

## 2020-11-08 MED ORDER — ONDANSETRON HCL 4 MG/2ML IJ SOLN: 4.0000 mg | Freq: Once | INTRAMUSCULAR | Status: DC | PRN

## 2020-11-08 MED ORDER — FENTANYL CITRATE (PF) 100 MCG/2ML IJ SOLN
INTRAMUSCULAR | Status: AC
  Administered 2020-11-08: 25 ug via INTRAVENOUS
  Filled 2020-11-08: qty 2

## 2020-11-08 MED ORDER — ONDANSETRON HCL 4 MG/2ML IJ SOLN
INTRAMUSCULAR | Status: DC | PRN
  Administered 2020-11-08: 4 mg via INTRAVENOUS

## 2020-11-08 MED ORDER — ACETAMINOPHEN 10 MG/ML IV SOLN
INTRAVENOUS | Status: AC
  Filled 2020-11-08: qty 100

## 2020-11-08 MED ORDER — MIDAZOLAM HCL 2 MG/2ML IJ SOLN
INTRAMUSCULAR | Status: AC
  Filled 2020-11-08: qty 2

## 2020-11-08 MED ORDER — HYDROCODONE-ACETAMINOPHEN 5-325 MG PO TABS: 1.0000 | ORAL_TABLET | Freq: Four times a day (QID) | ORAL | 0 refills | Status: AC | PRN

## 2020-11-08 MED ORDER — FENTANYL CITRATE (PF) 100 MCG/2ML IJ SOLN
INTRAMUSCULAR | Status: AC
  Filled 2020-11-08: qty 2

## 2020-11-08 MED ORDER — ROCURONIUM BROMIDE 100 MG/10ML IV SOLN
INTRAVENOUS | Status: DC | PRN
  Administered 2020-11-08: 5 mg via INTRAVENOUS
  Administered 2020-11-08: 60 mg via INTRAVENOUS

## 2020-11-08 MED ORDER — SUGAMMADEX SODIUM 200 MG/2ML IV SOLN
INTRAVENOUS | Status: DC | PRN
  Administered 2020-11-08: 200 mg via INTRAVENOUS

## 2020-11-08 MED ORDER — CEFAZOLIN SODIUM-DEXTROSE 2-4 GM/100ML-% IV SOLN
INTRAVENOUS | Status: AC
  Filled 2020-11-08: qty 100

## 2020-11-08 MED ORDER — PROPOFOL 10 MG/ML IV BOLUS
INTRAVENOUS | Status: AC
  Filled 2020-11-08: qty 60

## 2020-11-08 MED ORDER — MIDAZOLAM HCL 2 MG/2ML IJ SOLN
INTRAMUSCULAR | Status: DC | PRN
  Administered 2020-11-08: 1 mg via INTRAVENOUS

## 2020-11-08 MED ORDER — ACETAMINOPHEN 10 MG/ML IV SOLN
INTRAVENOUS | Status: DC | PRN
  Administered 2020-11-08: 1000 mg via INTRAVENOUS

## 2020-11-08 MED ORDER — FENTANYL CITRATE (PF) 100 MCG/2ML IJ SOLN
INTRAMUSCULAR | Status: DC | PRN
  Administered 2020-11-08 (×2): 25 ug via INTRAVENOUS

## 2020-11-08 MED ORDER — CHLORHEXIDINE GLUCONATE 0.12 % MT SOLN
OROMUCOSAL | Status: AC
  Administered 2020-11-08: 15 mL via OROMUCOSAL
  Filled 2020-11-08: qty 15

## 2020-11-08 MED ORDER — CHLORHEXIDINE GLUCONATE 0.12 % MT SOLN
OROMUCOSAL | Status: AC
  Filled 2020-11-08: qty 15

## 2020-11-08 MED ORDER — PROPOFOL 10 MG/ML IV BOLUS
INTRAVENOUS | Status: DC | PRN
  Administered 2020-11-08: 120 mg via INTRAVENOUS

## 2020-11-08 MED ORDER — CHLORHEXIDINE GLUCONATE 0.12 % MT SOLN: 15.0000 mL | Freq: Once | OROMUCOSAL | Status: AC

## 2020-11-08 MED ORDER — GLYCOPYRROLATE 0.2 MG/ML IJ SOLN
INTRAMUSCULAR | Status: DC | PRN
  Administered 2020-11-08: .2 mg via INTRAVENOUS

## 2020-11-08 SURGICAL SUPPLY — 32 items
BAG DRAIN CYSTO-URO LG1000N (MISCELLANEOUS) ×2 IMPLANT
BAG DRN RND TRDRP ANRFLXCHMBR (UROLOGICAL SUPPLIES) ×1
BAG URINE DRAIN 2000ML AR STRL (UROLOGICAL SUPPLIES) ×2 IMPLANT
BRUSH SCRUB EZ  4% CHG (MISCELLANEOUS) ×2
BRUSH SCRUB EZ 4% CHG (MISCELLANEOUS) ×1 IMPLANT
CATH FOL 2WAY LX 18X30 (CATHETERS) ×2 IMPLANT
CATH FOL 2WAY LX 22X30 (CATHETERS) ×1 IMPLANT
DRAPE UTILITY 15X26 TOWEL STRL (DRAPES) ×2 IMPLANT
DRSG TELFA 4X3 1S NADH ST (GAUZE/BANDAGES/DRESSINGS) ×2 IMPLANT
ELECT BIVAP BIPO 22/24 DONUT (ELECTROSURGICAL) ×2
ELECT LOOP 22F BIPOLAR SML (ELECTROSURGICAL)
ELECT REM PT RETURN 9FT ADLT (ELECTROSURGICAL)
ELECTRD BIVAP BIPO 22/24 DONUT (ELECTROSURGICAL) IMPLANT
ELECTRODE LOOP 22F BIPOLAR SML (ELECTROSURGICAL) IMPLANT
ELECTRODE REM PT RTRN 9FT ADLT (ELECTROSURGICAL) IMPLANT
GLOVE SURG UNDER POLY LF SZ7.5 (GLOVE) ×2 IMPLANT
GOWN STRL REUS W/ TWL LRG LVL3 (GOWN DISPOSABLE) ×1 IMPLANT
GOWN STRL REUS W/ TWL XL LVL3 (GOWN DISPOSABLE) ×1 IMPLANT
GOWN STRL REUS W/TWL LRG LVL3 (GOWN DISPOSABLE) ×2
GOWN STRL REUS W/TWL XL LVL3 (GOWN DISPOSABLE) ×2
IV NS IRRIG 3000ML ARTHROMATIC (IV SOLUTION) ×16 IMPLANT
KIT TURNOVER CYSTO (KITS) ×2 IMPLANT
LOOP CUT BIPOLAR 24F LRG (ELECTROSURGICAL) IMPLANT
NDL SAFETY ECLIPSE 18X1.5 (NEEDLE) ×1 IMPLANT
NEEDLE HYPO 18GX1.5 SHARP (NEEDLE) ×2
PACK CYSTO AR (MISCELLANEOUS) ×2 IMPLANT
PAD ARMBOARD 7.5X6 YLW CONV (MISCELLANEOUS) ×2 IMPLANT
SET IRRIG Y TYPE TUR BLADDER L (SET/KITS/TRAYS/PACK) ×2 IMPLANT
SURGILUBE 2OZ TUBE FLIPTOP (MISCELLANEOUS) ×2 IMPLANT
SYR TOOMEY IRRIG 70ML (MISCELLANEOUS) ×2
SYRINGE TOOMEY IRRIG 70ML (MISCELLANEOUS) ×1 IMPLANT
WATER STERILE IRR 1000ML POUR (IV SOLUTION) ×2 IMPLANT

## 2020-11-08 NOTE — Discharge Instructions (Signed)

## 2020-11-08 NOTE — Interval H&P Note (Signed)
UROLOGY H&P UPDATE  Agree with prior H&P dated 10/14/2020.  CT showing large bladder tumor, no evidence of metastatic disease  Cardiac: RRR Lungs: CTA bilaterally  Laterality: N/A Procedure: Transurethral resection of bladder tumor  Urine: Culture 2/14 mixed flora   We discussed transurethral resection of bladder tumor (TURBT) and risks and benefits at length. This is typically a 1 to 2-hour procedure done under general anesthesia in the operating room.  A scope is inserted through the urethra and used to resect abnormal tissue within the bladder, which is then sent to the pathologist to determine grade and stage of the tumor.  Risks include bleeding, infection, need for temporary Foley placement, and bladder perforation.  Treatment strategies are based on the type of tumor and depth of invasion.  We briefly reviewed the different treatment pathways for non-muscle invasive and muscle invasive bladder cancer.   Billey Co, MD 11/08/2020

## 2020-11-08 NOTE — Op Note (Signed)
Date of procedure: 11/08/20  Preoperative diagnosis:  1. Bladder tumor, >5cm  Postoperative diagnosis:  1. Same  Procedure: 1. TURBT large, >5cm  Surgeon: [name], MD  Anesthesia: General  Complications: None  Intraoperative findings:  1.  Extensive papillary bladder tumor at posterior wall, right lateral wall wrapping anteriorly across the dome 2. ~90% of tumor resected 3.  Ureteral orifices well away from tumor, and intact at conclusion of case 4.  Bimanual exam with no fixed masses or lesions  EBL: 10 mL  Specimens:  Bladder tumor superficial, bladder tumor deep, bladder tumor base  Drains: 36 French two-way Foley  Indication: Alexa Orozco is a 75 y.o. patient with extensive bladder tumor on CT who presents today for TURBT.  After reviewing the management options for treatment, they elected to proceed with the above surgical procedure(s). We have discussed the potential benefits and risks of the procedure, side effects of the proposed treatment, the likelihood of the patient achieving the goals of the procedure, and any potential problems that might occur during the procedure or recuperation. Informed consent has been obtained.  Description of procedure:  The patient was taken to the operating room and general anesthesia was induced. SCDs were placed for DVT prophylaxis. The patient was placed in the dorsal lithotomy position, prepped and draped in the usual sterile fashion, and preoperative antibiotics(Bactrim) were administered. A preoperative time-out was performed.   Bimanual exam was performed and there were no fixed tumors or masses noted.  A 21 French rigid cystoscope was used to intubate the bladder and thorough cystoscopy was performed.  The ureteral orifices were orthotopic bilaterally, and well away from the tumors.  There was extensive large papillary tumors at the posterior wall, as well as the right lateral wall wrapping anteriorly across the dome measuring  at least 10 cm.  The 74 French resectoscope was inserted into the bladder and tumor was methodically resected to the bladder wall.  I started at the posterior tumors then moved laterally and wrapped anteriorly to the dome.  The bladder was quite thin.  Bladder tumor was resected for a total of 2 hours.  At the conclusion of resection at least 90% of the tumor had been debulked and removed.  I then used the cold cup biopsy forceps to take a biopsy at the right lateral wall and the dome at the base of the tumor.  The bipolar button was then used for extensive fulguration and meticulous hemostasis was achieved.  With the bladder decompressed there was no bleeding noted.  A 22 French two-way Foley catheter was passed in the bladder with return of light pink urine and 10 cc placed in the balloon.  The catheter irrigated easily and was clear.  22 modifier secondary to extent of tumor measuring more than 10 cm, as well as thin bladder requiring meticulous time-consuming resection taking at least 2 times what a typical large bladder tumor would take, and a total of 2 hours were spent resecting tumor.  Disposition: Stable to PACU  Plan: Follow-up pathology, high concern for muscle invasive disease Resume Plavix in 5 days Maintain Foley 1 to 2 weeks insetting of extensive resection  [name], MD

## 2020-11-08 NOTE — Anesthesia Postprocedure Evaluation (Signed)
Anesthesia Post Note  Patient: Alexa Orozco  Procedure(s) Performed: TRANSURETHRAL RESECTION OF BLADDER TUMOR (TURBT) (N/A )  Patient location during evaluation: PACU Anesthesia Type: General Level of consciousness: awake and alert Pain management: pain level controlled Vital Signs Assessment: post-procedure vital signs reviewed and stable Respiratory status: spontaneous breathing, nonlabored ventilation, respiratory function stable and patient connected to nasal cannula oxygen Cardiovascular status: blood pressure returned to baseline and stable Postop Assessment: no apparent nausea or vomiting Anesthetic complications: no   No complications documented.   Last Vitals:  Vitals:   11/08/20 1231 11/08/20 1247  BP: 120/61 (!) 142/60  Pulse: 62 60  Resp: 13 18  Temp: (!) 36.3 C 36.4 C  SpO2: 100% 97%    Last Pain:  Vitals:   11/08/20 1247  TempSrc: Temporal  PainSc: 0-No pain                 Martha Clan

## 2020-11-08 NOTE — Transfer of Care (Signed)
Immediate Anesthesia Transfer of Care Note  Patient: Alexa Orozco  Procedure(s) Performed: TRANSURETHRAL RESECTION OF BLADDER TUMOR (TURBT) (N/A )  Patient Location: PACU  Anesthesia Type:General  Level of Consciousness: sedated, drowsy and patient cooperative  Airway & Oxygen Therapy: Patient Spontanous Breathing and Patient connected to face mask oxygen  Post-op Assessment: Report given to RN and Post -op Vital signs reviewed and stable  Post vital signs: Reviewed and stable  Last Vitals:  Vitals Value Taken Time  BP 158/61 11/08/20 1146  Temp    Pulse 60 11/08/20 1150  Resp 14 11/08/20 1150  SpO2 100 % 11/08/20 1150  Vitals shown include unvalidated device data.  Last Pain:  Vitals:   11/08/20 0833  TempSrc: Temporal  PainSc: 0-No pain      Patients Stated Pain Goal: 0 (20/72/18 2883)  Complications: No complications documented.

## 2020-11-08 NOTE — Anesthesia Preprocedure Evaluation (Addendum)
Anesthesia Evaluation  Patient identified by MRN, date of birth, ID band Patient awake    Reviewed: Allergy & Precautions, NPO status , Patient's Chart, lab work & pertinent test results  History of Anesthesia Complications Negative for: history of anesthetic complications  Airway Mallampati: II       Dental   Pulmonary asthma , COPD,  COPD inhaler, Current Smoker,           Cardiovascular hypertension, Pt. on medications (-) Past MI and (-) CHF (-) dysrhythmias (-) Valvular Problems/Murmurs     Neuro/Psych neg Seizures Anxiety Depression    GI/Hepatic Neg liver ROS, GERD  Medicated,  Endo/Other  neg diabetesHypothyroidism   Renal/GU negative Renal ROS     Musculoskeletal   Abdominal   Peds  Hematology   Anesthesia Other Findings   Reproductive/Obstetrics                            Anesthesia Physical Anesthesia Plan  ASA: III  Anesthesia Plan: General   Post-op Pain Management:    Induction: Intravenous  PONV Risk Score and Plan: 2 and Ondansetron and Dexamethasone  Airway Management Planned: Oral ETT  Additional Equipment:   Intra-op Plan:   Post-operative Plan:   Informed Consent: I have reviewed the patients History and Physical, chart, labs and discussed the procedure including the risks, benefits and alternatives for the proposed anesthesia with the patient or authorized representative who has indicated his/her understanding and acceptance.       Plan Discussed with:   Anesthesia Plan Comments:         Anesthesia Quick Evaluation

## 2020-11-08 NOTE — Anesthesia Procedure Notes (Signed)
Procedure Name: Intubation Date/Time: 11/08/2020 9:32 AM Performed by: Lowry Bowl, CRNA Pre-anesthesia Checklist: Patient identified, Emergency Drugs available, Suction available and Patient being monitored Patient Re-evaluated:Patient Re-evaluated prior to induction Oxygen Delivery Method: Circle system utilized Preoxygenation: Pre-oxygenation with 100% oxygen Induction Type: IV induction Ventilation: Mask ventilation without difficulty Laryngoscope Size: 3 and McGraph Grade View: Grade I Tube type: Oral Tube size: 7.0 mm Number of attempts: 1 Airway Equipment and Method: Stylet and Video-laryngoscopy Placement Confirmation: ETT inserted through vocal cords under direct vision,  positive ETCO2 and breath sounds checked- equal and bilateral Secured at: 21 cm Tube secured with: Tape Dental Injury: Teeth and Oropharynx as per pre-operative assessment

## 2020-11-11 LAB — SURGICAL PATHOLOGY

## 2020-11-13 ENCOUNTER — Ambulatory Visit: Payer: Medicare Other | Admitting: Urology

## 2020-11-13 ENCOUNTER — Telehealth: Payer: Self-pay

## 2020-11-13 NOTE — Telephone Encounter (Signed)
Incoming call from pt's daughter who states that she believes the patient's catheter is dislodged. Upon questioning daughter states that catheter is draining properly into bag, pt denies pain, fever, chills, nausea, or vomiting. Daughter states that the pt is feeling what she describes as a contraction and then she urinates. Advised pt and daughter that this is a bladder spasm. Advised pt and daughter that as long as urine is draining into the bag (currently 353ml in bag) that cath is not dislodged. Spoke with Dr. Diamantina Providence who recommends that the patient take Myrbetriq 50mg  x 2 weeks. Advised pt to come by office to pick up samples. Daughter voiced understanding. Samples placed up front with receptionist. Pt advised on when to seek care in the ED and when to call the office.

## 2020-11-20 ENCOUNTER — Encounter: Payer: Self-pay | Admitting: Urology

## 2020-11-20 ENCOUNTER — Other Ambulatory Visit: Payer: Self-pay

## 2020-11-20 ENCOUNTER — Ambulatory Visit (INDEPENDENT_AMBULATORY_CARE_PROVIDER_SITE_OTHER): Payer: Medicare Other | Admitting: Urology

## 2020-11-20 VITALS — BP 156/93 | HR 62 | Ht 62.0 in | Wt 168.0 lb

## 2020-11-20 DIAGNOSIS — C672 Malignant neoplasm of lateral wall of bladder: Secondary | ICD-10-CM | POA: Diagnosis not present

## 2020-11-20 DIAGNOSIS — Z466 Encounter for fitting and adjustment of urinary device: Secondary | ICD-10-CM | POA: Diagnosis not present

## 2020-11-20 MED ORDER — SULFAMETHOXAZOLE-TRIMETHOPRIM 800-160 MG PO TABS
2.0000 | ORAL_TABLET | Freq: Once | ORAL | Status: AC
  Administered 2020-11-20: 2 via ORAL

## 2020-11-20 NOTE — Progress Notes (Signed)
Catheter Removal  Patient is present today for a catheter removal.  42ml of water was drained from the balloon. A 22FR foley cath was removed from the bladder no complications were noted . Patient tolerated well.  Performed by: Fonnie Jarvis, CMA  Follow up/ Additional notes: Will call with surgery dates

## 2020-11-20 NOTE — Patient Instructions (Signed)
Bladder Cancer  Bladder cancer is a condition in which abnormal tissue (a tumor) grows in the bladder. The bladder is the organ that holds urine. Two tubes (ureters) carry the urine from the kidneys to the bladder. The bladder wall is made of layers of tissue. Cancer that spreads through these layers of the bladder wall becomes more difficult to treat. What are the causes? The cause of this condition is not known. What increases the risk? The following factors may make you more likely to develop this condition:  Smoking.  Working where there are risks (occupational exposures), such as working with rubber, leather, clothing fabric, dyes, chemicals, and paint.  Being 55 years of age or older.  Being female.  Having bladder inflammation that is long-term (chronic).  Having a history of cancer, including: ? A family history of bladder cancer. ? Personal experience with bladder cancer. ? Having had certain treatments for cancer before. These include:  Medicines to kill cancer cells (chemotherapy).  Strong X-ray beams or capsules high in energy to kill cancer cells and shrink tumors (radiation therapy).  Having been exposed to arsenic. This is a chemical element that can poison you. What are the signs or symptoms? Early symptoms of this condition include:  Seeing blood in your urine.  Feeling pain when urinating.  Having infections of your urinary system (urinary tract infections or UTIs) that happen often.  Having to urinate sooner or more often than usual. Later symptoms of this condition include:  Not being able to urinate.  Pain on one side of your lower back.  Loss of appetite.  Weight loss.  Tiredness (fatigue).  Swelling in your feet.  Bone pain. How is this diagnosed? This condition is diagnosed based on:  Your medical history.  A physical exam.  Lab tests, such as urine tests.  Imaging tests.  Your symptoms. You may also have other tests or  procedures done, such as:  A cystoscopy. A narrow tube is inserted into your bladder through the organ that connects your bladder to the outside of your body (urethra). This is done to view the lining of your bladder for tumors.  A biopsy. This procedure involves removing a tissue sample to look at it under a microscope to see if cancer is present. It is important to find out:  How deeply into the bladder wall cancer has grown.  Whether cancer has spread to any other parts of your body. This may require blood tests or imaging tests, such as a CT scan, MRI, bone scan, or X-rays. How is this treated? Your health care provider may recommend one or more types of treatment based on the stage of your cancer. The most common types of treatment are:  Surgery to remove the cancer. Procedures that may be done include: ? Removing a tumor on the inside wall of the bladder (transurethral resection). ? Removing the bladder (cystectomy).  Radiation therapy. This is often used together with chemotherapy.  Chemotherapy.  Immunotherapy. This uses medicines to help your immune system destroy cancer cells. Follow these instructions at home:  Take over-the-counter and prescription medicines only as told by your health care provider.  Eat a healthy diet. Some of your treatments might affect your appetite.  Do not use any products that contain nicotine or tobacco, such as cigarettes, e-cigarettes, and chewing tobacco. If you need help quitting, ask your health care provider.  Consider joining a support group. This may help you learn to cope with the stress of having   bladder cancer.  Tell your cancer care team if you develop side effects. Your team may be able to recommend ways to get relief.  Keep all follow-up visits as told by your health care provider. This is important. Where to find more information  American Cancer Society: www.cancer.[place] ([place]):  www.cancer.gov Contact a health care provider if:  You have symptoms of a urinary tract infection. These include: ? Fever. ? Chills. ? Weakness. ? Muscle aches. ? Pain in your abdomen. ? Urge to urinate that is stronger and happens more often than usual. ? Burning feeling in the bladder or urethra when you urinate. Get help right away if:  There is blood in your urine.  You cannot urinate.  You have severe pain or other symptoms that do not go away. Summary  Bladder cancer is a condition in which tumors grow in the bladder and cause illness.  This condition is diagnosed based on your medical history, a physical exam, lab tests, imaging tests, and your symptoms.  Your health care provider may recommend one or more types of treatment based on the stage of your cancer.  Consider joining a support group. This may help you learn to cope with the stress of having bladder cancer. This information is not intended to replace advice given to you by your health care provider. Make sure you discuss any questions you have with your health care provider. Document Revised: 04/26/2019 Document Reviewed: 04/26/2019 Elsevier Patient Education  [employer].  Patient Education: (BCG) Into the Bladder (Intravesical Chemotherapy)  BCG is a vaccine which is used to prevent tuberculosis (TB).  But it's also a helpful treatment for some early bladder cancers.  When BCG goes directly into the bladder the treatment is described as intravesical.  BCG is a type of immunotherapy.  Immunotherapy stimulates the body's immune system to destroy cancer cells.  How it's given BCG treatment is given to you in an outpatient setting.  It takes a few minutes to administer and you can go home as soon as it's finished.  It might be a good idea to ask someone to bring you, particularly the fist time.  Unlike chemotherapy into the bladder, BCG treatment is never given immediately after surgery to remove bladder  tumors.  There needs to be a delay usually of at least two weeks after surgery, before you can have it.  You won't be given treatment with BCG if you are unwell or have an infection in your urine.  You're usually asked to limit the amount you drink before your treatment.  This will help to increase the concentration of BCG in your bladder.  Drinking too much before your treatment may make your bladder feel uncomfortably full.  If you normally take water tablets (diuretics) take them later in the day after your treatment.  Your nurse or doctor will give you more advise about preparing for your treatment.  You will have a small tube (catheter) placed into your bladder.  Your doctor will then put the liquid vaccine directly into your bladder through the catheter and remove the catheter.  You will need to hold your urine for two hours afterwards.  This can be difficult but it's to give the treatment time to work.  You can walk around during this time.  When the treatment is over you can go to the toilet.  After your treatment there are some precautions you'll need to take.  This is because BCG is a  live vaccine and other people shouldn't be exposed to it.  For the next six hours, you'll need to avoid your urine splashing on the toilet seat and getting any urine on your hands.  It might be easer for men to sit down when they're using an ordinary toilet although using a stand up urinal should be alright.  The main this is to avoid splashing urine and spreading the vaccine.  You will also be asked to put 1/2 cup undiluted bleach into the toilet to destroy any live vaccine and leave it for 15 minutes until you flush.  Side Effects Because BCG goes directly into the bladder most of the side effects are linked with the bladder.  They usually go away within one to two days after your treatment.  The most common ones are: -needing to pass urine often -pain when you pass urine -blood in urine -flu-like symptoms  (tiredness, general aching and raised temperature)  Theses side effects should settle down within a day or two.  If they don't get better contact your doctor.  Drinking lots of fluids can help flush the drug out of your bladder and reduce some of these effects.  Taking Ibuprofen or Aleve is encouraged unless you have a condition that would make these medications unsafe to take (renal failure, diabetes, gerd)  Rare side effects can include a continuing high temperature (fever), pain in your joints and a cough.  If you have any of these symptoms, or if you feel generally unwell, contact your doctor.  These symptoms could be a sign of a more serious infection (due to BCG) that needs to be treated immediately.  If this happens you'll be treated with the same drugs (antibiotics) that are used to treat TB.  Contraception Men should use a condom during sex for the first 48 hours after their treatment.  If you are a women who has had BCG treatment then your partner should use a condom.  Using a condom will protect your partner from any vaccine present in your semen or vaginal fluid.  We don't know how BCG may affect a developing fetus so it's not advisable to become pregnant or father a child while having it.  It is important to use effective contraception during your treatment and for six weeks afterwards.  You can discuss this with your doctor or specialist nurse.

## 2020-11-20 NOTE — Progress Notes (Signed)
   11/20/2020 11:56 AM   [name] [name] Jan 17, 1946 [id number]  Reason for visit: Follow up TURBT  HPI: I saw Ms. Allor and her daughter back in urology clinic to discuss pathology from TURBT.  She is a 75 year old female who presented with severe urinary symptoms of urgency, frequency, pelvic pain, and CT showed a large bladder tumor involving the majority of the right side of the bladder and dome.  There was no definite evidence of metastatic disease on CT scan.  She underwent TURBT on 11/08/2020 and >90% of tumor was resected.  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  Her Foley catheter remains in and urine is clear yellow.  She was instructed to resume Plavix 5 days postop, but has not yet resumed this.  I again instructed her to resume her Plavix.  Her Foley catheter was removed today, and Bactrim was given this morning for prophylaxis, as well as an additional dose tonight.  We had a long conversation about her new diagnosis of non-muscle invasive bladder cancer.  We reviewed the AUA guidelines regarding treatment strategies, and the need for repeat TURBT in 4 to 6 weeks from her original surgery.  We discussed the ~30% chance of upstaging to muscle invasive disease on repeat TURBT, as well as likely residual tumor based on her extensive tumor on initial presentation.  We also discussed BCG as the primary treatment option if she is confirmed to have nonmuscle invasive disease on repeat TURBT.  Schedule repeat TURBT early April, will need to hold Plavix preop   Billey Co, MD  [facility] [address], [place], [place] [address] [phone]

## 2020-11-26 ENCOUNTER — Other Ambulatory Visit: Payer: Self-pay | Admitting: Urology

## 2020-11-26 DIAGNOSIS — D494 Neoplasm of unspecified behavior of bladder: Secondary | ICD-10-CM

## 2020-11-26 MED ORDER — SULFAMETHOXAZOLE-TRIMETHOPRIM 800-160 MG PO TABS: 1.0000 | ORAL_TABLET | Freq: Once | ORAL | Status: DC

## 2020-12-02 ENCOUNTER — Encounter
Admission: RE | Admit: 2020-12-02 | Discharge: 2020-12-02 | Disposition: A | Discharge status: Home / Self Care | Payer: Medicare Other | Source: Ambulatory Visit | Attending: Urology | Admitting: Urology

## 2020-12-02 ENCOUNTER — Other Ambulatory Visit: Payer: Self-pay

## 2020-12-02 NOTE — Patient Instructions (Addendum)
Your procedure is scheduled on:12-13-20 FRIDAY Report to the Registration Desk on the 1st floor of the Medical [facility]-Then proceed to the 2nd floor Surgery Desk in the Medical [facility] To find out your arrival time, please call [phone] between 1PM - 3PM on:12-12-20 THURSDAY  REMEMBER: Instructions that are not followed completely may result in serious medical risk, up to and including death; or upon the discretion of your surgeon and anesthesiologist your surgery may need to be rescheduled.  Do not eat food after midnight the night before surgery.  No gum chewing, lozengers or hard candies.  You may however, drink CLEAR liquids up to 2 hours before you are scheduled to arrive for your surgery. Do not drink anything within 2 hours of your scheduled arrival time.  Clear liquids include: - water  - apple juice without pulp - gatorade  - black coffee or tea (Do NOT add milk or creamers to the coffee or tea) Do NOT drink anything that is not on this list.  TAKE THESE MEDICATIONS THE MORNING OF SURGERY WITH A SIP OF WATER: -SYNTHROID (LEVOTHYROXINE) -PRAVASTATIN (PRAVACHOL) -ZOLOFT (SERTRALINE) -PROTONIX (PANTOPRAZOLE)-take one the night before and one on the morning of surgery - helps to prevent nausea after surgery.) -YOU MAY TAKE KLONOPIN (CLONAZEPAM) THE DAY OF SURGERY IF NEEDED FOR ANXIETY  USE YOUR ALBUTEROL NEBULIZER AND SPIRIVA INHALER THE DAY OF SURGERY AND BRING YOUR ALBUTEROL INHALER THE DAY OF SURGERY  Follow recommendations from Cardiologist, Pulmonologist or PCP regarding stopping Aspirin, Coumadin, Plavix, Eliquis, Pradaxa, or Pletal-INSTRUCTED BY DR SNINSKY'S OFFICE TO STOP PLAVIX (CLOPIDOGREL) 5 DAYS PRIOR TO SURGERY-LAST DOSE WILL BE ON 12-07-20 SATURDAY  One week prior to surgery: Stop Anti-inflammatories (NSAIDS) such as Advil, Aleve, Ibuprofen, Motrin, Naproxen, Naprosyn and Aspirin based products such as Excedrin, Goodys Powder, BC Powder-OK TO TAKE TYLENOL IF  NEEDED  Stop ANY OVER THE COUNTER supplements until after surgery.  No Alcohol for 24 hours before or after surgery.  No Smoking including e-cigarettes for 24 hours prior to surgery.  No chewable tobacco products for at least 6 hours prior to surgery.  No nicotine patches on the day of surgery.  Do not use any "recreational" drugs for at least a week prior to your surgery.  Please be advised that the combination of cocaine and anesthesia may have negative outcomes, up to and including death. If you test positive for cocaine, your surgery will be cancelled.  On the morning of surgery brush your teeth with toothpaste and water, you may rinse your mouth with mouthwash if you wish. Do not swallow any toothpaste or mouthwash.  Do not wear jewelry, make-up, hairpins, clips or nail polish.  Do not wear lotions, powders, or perfumes.   Do not shave body from the neck down 48 hours prior to surgery just in case you cut yourself which could leave a site for infection.  Also, freshly shaved skin may become irritated if using the CHG soap.  Contact lenses, hearing aids and dentures may not be worn into surgery.  Do not bring valuables to the hospital. [facility] is not responsible for any missing/lost belongings or valuables.   Notify your doctor if there is any change in your medical condition (cold, fever, infection).  Wear comfortable clothing (specific to your surgery type) to the hospital.  Plan for stool softeners for home use; pain medications have a tendency to cause constipation. You can also help prevent constipation by eating foods high in fiber such as fruits and vegetables  and drinking plenty of fluids as your diet allows.  After surgery, you can help prevent lung complications by doing breathing exercises.  Take deep breaths and cough every 1-2 hours. Your doctor may order a device called an Incentive Spirometer to help you take deep breaths. When coughing or sneezing, hold a  pillow firmly against your incision with both hands. This is called "splinting." Doing this helps protect your incision. It also decreases belly discomfort.  If you are being admitted to the hospital overnight, leave your suitcase in the car. After surgery it may be brought to your room.  If you are being discharged the day of surgery, you will not be allowed to drive home. You will need a responsible adult (18 years or older) to drive you home and stay with you that night.   If you are taking public transportation, you will need to have a responsible adult (18 years or older) with you. Please confirm with your physician that it is acceptable to use public transportation.   Please call the [facility] at [phone] if you have any questions about these instructions.  Surgery Visitation Policy:  Patients undergoing a surgery or procedure may have one family member or support person with them as long as that person is not COVID-19 positive or experiencing its symptoms.  That person may remain in the waiting area during the procedure.  Inpatient Visitation:    Visiting hours are 7 a.m. to 8 p.m. Inpatients will be allowed two visitors daily. The visitors may change each day during the patient's stay. No visitors under the age of 24. Any visitor under the age of 77 must be accompanied by an adult. The visitor must pass COVID-19 screenings, use hand sanitizer when entering and exiting the patient's room and wear a mask at all times, including in the patient's room. Patients must also wear a mask when staff or their visitor are in the room. Masking is required regardless of vaccination status.

## 2020-12-03 ENCOUNTER — Telehealth: Payer: Self-pay | Admitting: Family Medicine

## 2020-12-03 ENCOUNTER — Telehealth: Payer: Self-pay

## 2020-12-03 NOTE — Telephone Encounter (Signed)
Patient left message yesterday to follow up on status of forms dropped off last week to have handicapped placard renewed. Patient stated she will be in the area today and would like to pick it up. Please advise at [phone]

## 2020-12-03 NOTE — Telephone Encounter (Signed)
Left message to call back and ask to speak with pre-op team.  Darreld Mclean, PA-C 12/03/2020 10:15 AM

## 2020-12-03 NOTE — Telephone Encounter (Signed)
Forms completed

## 2020-12-03 NOTE — Telephone Encounter (Signed)
   [facility] Pre-operative Risk Assessment    Patient Name: Ladrea Holladay  DOB: May 19, 1946  MRN: [id number]   HEARTCARE STAFF: - Please ensure there is not already an duplicate clearance open for this procedure. - Under Visit Info/Reason for Call, type in Other and utilize the format Clearance MM/DD/YY or Clearance TBD. Do not use dashes or single digits. - If request is for dental extraction, please clarify the # of teeth to be extracted.  Request for surgical clearance:  1. What type of surgery is being performed? TRANSURETHRAL RESECTION OF BLADDER TUMOR (TURBT)   2. When is this surgery scheduled? 12/13/20   3. What type of clearance is required (medical clearance vs. Pharmacy clearance to hold med vs. Both)? Medical  4. Are there any medications that need to be held prior to surgery and how long? CLOPIDOGREL x 5 days (last dose 12/07/20)   5. Practice name and name of physician performing surgery? Dr. [name], MD  6. What is the office phone number? [phone]   7.   What is the office fax number? [phone]  8.   Anesthesia type (None, local, MAC, general) ? General   Cleon Gustin 12/03/2020, 9:40 AM  _________________________________________________________________   (provider comments below) Cleared through Cardiology Pool in 10/2020. Given length of time since initial clearance was issued, wanted to allow cardiology service the opportunity to speak with patient and undate as necessary.

## 2020-12-03 NOTE — Telephone Encounter (Signed)
Called patient to advise that forms aren't ready - not in folder at front desk. Left message with daughter Ishmael Holter and informed her I've asked the nurse to call patient when forms ready. Please advise [phone]

## 2020-12-04 ENCOUNTER — Other Ambulatory Visit: Payer: Medicare Other

## 2020-12-04 ENCOUNTER — Other Ambulatory Visit: Payer: Self-pay

## 2020-12-04 ENCOUNTER — Encounter
Admission: RE | Admit: 2020-12-04 | Discharge: 2020-12-04 | Disposition: A | Discharge status: Home / Self Care | Payer: Medicare Other | Source: Ambulatory Visit | Attending: Urology | Admitting: Urology

## 2020-12-04 DIAGNOSIS — I10 Essential (primary) hypertension: Secondary | ICD-10-CM | POA: Diagnosis not present

## 2020-12-04 DIAGNOSIS — Z01810 Encounter for preprocedural cardiovascular examination: Secondary | ICD-10-CM | POA: Insufficient documentation

## 2020-12-04 DIAGNOSIS — D494 Neoplasm of unspecified behavior of bladder: Secondary | ICD-10-CM

## 2020-12-04 DIAGNOSIS — I2510 Atherosclerotic heart disease of native coronary artery without angina pectoris: Secondary | ICD-10-CM | POA: Diagnosis not present

## 2020-12-05 LAB — URINALYSIS, COMPLETE
Bilirubin, UA: NEGATIVE
Glucose, UA: NEGATIVE
Ketones, UA: NEGATIVE
Nitrite, UA: NEGATIVE
Specific Gravity, UA: 1.02 (ref 1.005–1.030)
Urobilinogen, Ur: 0.2 mg/dL (ref 0.2–1.0)
pH, UA: 6.5 (ref 5.0–7.5)

## 2020-12-05 LAB — MICROSCOPIC EXAMINATION: WBC, UA: >30 /hpf — AB (ref 0–5)

## 2020-12-05 NOTE — Telephone Encounter (Signed)
Honor Loh, NP sent staff message checking on clearance request as procedure is Wed 4/15 and tomorrow is Friday 4/8.

## 2020-12-06 NOTE — Telephone Encounter (Signed)
   Patient Name: Alexa Orozco  DOB: 04-Nov-1945  MRN: [id number]   Primary Cardiologist: Glenetta Hew, MD  Chart reviewed as part of pre-operative protocol coverage. Patient was contacted 12/06/2020 in reference to pre-operative risk assessment for pending surgery as outlined below.  Alexa Orozco was last seen on 12/25/2019 by Dr. Ellyn Hack.  Since that day, Alexa Orozco has done well without chest pain or shortness of breath.  Therefore, based on ACC/AHA guidelines, the patient would be at acceptable risk for the planned procedure without further cardiovascular testing.   Patient has been instructed to hold Plavix for 5 days prior to the surgery, and restart as soon as possible afterward at the surgeon's discretion.  The patient was advised that if she develops new symptoms prior to surgery to contact our office to arrange for a follow-up visit, and she verbalized understanding.  I will route this recommendation to the requesting party via Epic fax function and remove from pre-op pool. Please call with questions.  [place], [place] 12/06/2020, 5:14 PM

## 2020-12-09 ENCOUNTER — Encounter: Payer: Self-pay | Admitting: Urology

## 2020-12-09 NOTE — Progress Notes (Signed)
Perioperative Services  Pre-Admission/Anesthesia Testing Clinical Review  Date: 12/09/20  Patient Demographics:  Name: Alexa Orozco DOB:   1946/01/04 MRN:   [id number]  Planned Surgical Procedure(s):    Case: [id number] Date/Time: 12/13/20 0715   Procedure: TRANSURETHRAL RESECTION OF BLADDER TUMOR (TURBT) (N/A )   Anesthesia type: Choice   Pre-op diagnosis: Bladder Cancer   Location: [facility] OR ROOM 10 / [place] [facility]S FOR ANESTHESIA GROUP   Surgeons: Billey Co, MD    NOTE: Available PAT nursing documentation and vital signs have been reviewed. Clinical nursing staff has updated patient's PMH/PSHx, current medication list, and drug allergies/intolerances to ensure comprehensive history available to assist in medical decision making as it pertains to the aforementioned surgical procedure and anticipated anesthetic course.   Clinical Discussion:  Alexa Orozco is a 75 y.o. female who is submitted for pre-surgical anesthesia review and clearance prior to her undergoing the above procedure. Patient is a Current Smoker (17.5 pack years). Pertinent PMH includes: CAD, angina, palpitations, HTN, HLD, hypothyroidism, GERD (on daily PPI), asthma, COPD, Raynaud's disease, depression, anxiety, insomnia.  Patient is followed by cardiology Ellyn Hack, MD). She was last seen in the cardiology clinic on 12/25/2019; notes reviewed.  At time of her clinic visit, patient doing well overall from a cardiovascular perspective.  She denied any episodes of chest pain, however continue to experience exertional dyspnea.  Patient denied PND, orthopnea, palpitations, peripheral edema, vertiginous symptoms, and presyncope/syncope.  PMH notable for CAD.  Diagnostic left heart catheterization performed in 10/2005 demonstrated 85% stenosis of the mid RCA and 20% stenosis of the LAD with preserved LVEF of 63%. PCI was performed and Cypher DES x 1 placed to the mid RCA lesion.  Interval noninvasive studies include a TTE that was performed  on 05/22/2009 revealing a normal left ventricular systolic function (EF >22%) with mild valvular insufficiency.  Myocardial perfusion imaging study performed on 06/06/2013 demonstrated no evidence of stress-induced myocardial ischemia or arrhythmia (see full interpretation of cardiovascular testing and intervention below).  Patient on daily DAPT therapy (ASA + clopidogrel); compliant with therapy with no evidence of GI bleeding.  Given the amount of time since patient's cardiac intervention, recommendation was to discontinue daily low-dose ASA while continuing the clopidogrel.  Blood pressure well controlled at 125/71 no medications.  Patient currently not on beta-blocker therapy due to previous bradycardia and history of COPD.  Patient is on a statin for HLD.  Functional capacity, as defined by DASI, is documented as being >/= 4 METS. No other changes were made to patient's medication regimen.  Patient to follow-up with outpatient cardiology in 12 months or sooner if needed.  Patient scheduled to undergo TURBT on 12/13/2020 with Dr. [name].  Given patient's past medical history significant for cardiovascular disease and intervention, presurgical cardiac clearance was sought by PAT team. Per cardiology, "based on patient's past medical history and time since his last clinic visit, patient would be at an overall ACCEPTABLE risk for the planned procedure without further cardiovascular testing or intervention at this time".  Again, this patient is on daily antiplatelet therapy.  She has been instructed on recommendations from her cardiologist for holding her clopidogrel for 5 days prior to her procedure with plans to restart as soon as postoperative bleeding risk minimized by primary attending surgeon.  Patient is aware that her last dose of clopidogrel will be on 12/07/2020.  Patient denies previous perioperative complications with anesthesia in the past. In review of the available records, it is noted that  patient underwent a general anesthetic course here (ASA III) in 10/2020 without documented complications.   Vitals with BMI 11/20/2020 11/08/2020 11/08/2020  Height 5\' 2"  - -  Weight 168 lbs - -  BMI 42.59 - -  Systolic 563 875 643  Diastolic 93 60 61  Pulse 62 60 62    Providers/Specialists:   NOTE: Primary physician provider listed below. Patient may have been seen by APP or partner within same practice.   PROVIDER ROLE / SPECIALTY LAST Ranae Pila, MD Urology (Surgeon)  11/20/2020  Susy Frizzle, MD Primary Care Provider  10/03/2020  Glenetta Hew, MD Cardiology  12/25/2019   Allergies:  Thayer Jew hcl], Cephalexin, Effexor [venlafaxine], and Levaquin [levofloxacin in d5w]  Current Home Medications:   . sulfamethoxazole-trimethoprim (BACTRIM DS) 800-160 MG per tablet 1 tablet   . acetaminophen (TYLENOL) 500 MG tablet  . albuterol (PROVENTIL) (2.5 MG/3ML) 0.083% nebulizer solution  . albuterol (VENTOLIN HFA) 108 (90 Base) MCG/ACT inhaler  . clonazePAM (KLONOPIN) 0.5 MG tablet  . clopidogrel (PLAVIX) 75 MG tablet  . cromolyn (OPTICROM) 4 % ophthalmic solution  . Eszopiclone 3 MG TABS  . levothyroxine (SYNTHROID) 88 MCG tablet  . montelukast (SINGULAIR) 10 MG tablet  . nitroGLYCERIN (NITROSTAT) 0.4 MG SL tablet  . pantoprazole (PROTONIX) 40 MG tablet  . pravastatin (PRAVACHOL) 20 MG tablet  . sertraline (ZOLOFT) 100 MG tablet  . SPIRIVA RESPIMAT 2.5 MCG/ACT AERS  . tiZANidine (ZANAFLEX) 4 MG tablet   History:   Past Medical History:  Diagnosis Date  . Allergies   . Allergy   . Angina pectoris, unspecified ([place])   . Anxiety   . Asthma   . CAD S/P percutaneous coronary angioplasty February 2007   Class III Angina --> Cath: 85% -- PCI 3.21mm x 91mm (4.0 mm) Cypher DES to RCA; Myoview October 2014: normal stress test: LOW RISK; mild anteroapical breast attenuation  . Cataract    h/o bilat repair  . Claudication ([place]) 06/01/2013  . COPD (chronic  obstructive pulmonary disease) ([place])   . Depression 11/29/2013  . Emphysema of lung ([place])   . GERD (gastroesophageal reflux disease)   . History of palpitations   . History of tobacco abuse   . Hyperlipidemia   . Hypertension    well-controlled  . Hypothyroidism   . Osteoporosis   . Raynaud disease    Past Surgical History:  Procedure Laterality Date  . ABDOMINAL HYSTERECTOMY     h/o partila hysterectomy  . BREAST BIOPSY  1988  . CORONARY ANGIOPLASTY WITH STENT PLACEMENT  10/14/2005   cypher DES (3.15mmx18mm) to high grade RCA lesion  . NM MYOVIEW LTD  July 2012 of October 2014   '12: dipyridamole; Normal, low risk study; 2014 - normal stress test: LOW RISK; mild anteroapical breast attenuation  . TONSILLECTOMY  1959  . TRANSTHORACIC ECHOCARDIOGRAM  05/22/2009   EF=>55%, normal LV systolic function; normal RV systolic function; mild MR/TR  . TRANSURETHRAL RESECTION OF BLADDER TUMOR N/A 11/08/2020   Procedure: TRANSURETHRAL RESECTION OF BLADDER TUMOR (TURBT);  Surgeon: Billey Co, MD;  Location: [facility] ORS;  Service: Urology;  Laterality: N/A;   Family History  Problem Relation Age of Onset  . Liver cancer Mother   . Heart attack Mother   . Heart Problems Sister   . Lupus Sister   . Liver cancer Brother   . Asthma Brother   . Breast cancer Neg Hx   . Prostate cancer Neg Hx   .  Bladder Cancer Neg Hx   . Kidney cancer Neg Hx    Social History   Tobacco Use  . Smoking status: Current Every Day Smoker    Packs/day: 0.50    Years: 35.00    Pack years: 17.50    Types: Cigarettes  . Smokeless tobacco: Never Used  . Tobacco comment: 11/11/18 still at 1/2ppd  Vaping Use  . Vaping Use: Never used  Substance Use Topics  . Alcohol use: No  . Drug use: No    Pertinent Clinical Results:  LABS: Labs reviewed: Acceptable for surgery.  Lab Results  Component Value Date   WBC 6.9 05/31/2020   HGB 14.0 05/31/2020   HCT 41.6 05/31/2020   MCV 94.5 05/31/2020   PLT 316  05/31/2020   Lab Results  Component Value Date   NA 142 10/03/2020   K 5.0 10/03/2020   CO2 19 (L) 10/03/2020   GLUCOSE 67 10/03/2020   BUN 25 10/03/2020   CREATININE 0.96 (H) 10/03/2020   CALCIUM 9.4 10/03/2020   GFRNONAA 59 (L) 10/03/2020   GFRAA 68 10/03/2020    ECG: Date: 12/04/2020 Time ECG obtained: 1352 PM Rate: 65 bpm Rhythm: normal sinus Axis (leads I and aVF): Right axis deviation Intervals: PR 146 ms. QRS 88 ms. QTc 443 ms. ST segment and T wave changes: No evidence of acute ST segment elevation or depression Comparison: Similar to previous tracing obtained on 12/25/2019.   IMAGING / PROCEDURES: LEXISCAN performed on 06/06/2013 1. LVEF 76% 2. There was no evidence of stress-induced myocardial ischemia or arrhythmia 3. Normal low risk nuclear study with mild anteroapical breast attenuation.  ECHOCARDIOGRAM performed on 05/22/2009 1. LVEF 57% 2. Normal left ventricular systolic function 3. Normal left ventricular cavity size 4. Normal right ventricular systolic function and cavity size 5. The left atrial size is normal 6. Right atrial size is normal 7. Mild mitral valve regurgitation 8. Mild tricuspid regurgitation 9. No aortic or pulmonic valvular regurgitation 10. No evidence of valvular stenosis  LEFT HEART CATHETERIZATION AND CORONARY ANGIOGRAPHY performed on 10/09/2005 1. Preserved LV function with an estimated ejection fraction of 63% 2. Mild hypokinesis involving the mid inferior and low inferolateral wall 3. Focal 85% mid RCA stenosis  PCI was performed using 3.5 mm x 18 mm Cypher DES to the RCA 4. Minimal 20% narrowing in the LAD   Impression and Plan:  Alexa Orozco has been referred for pre-anesthesia review and clearance prior to her undergoing the planned anesthetic and procedural courses. Available labs, pertinent testing, and imaging results were personally reviewed by me. This patient has been appropriately cleared by cardiology with an  overall ACCEPTABLE risk of significant perioperative cardiovascular complications.  Based on clinical review performed today (12/09/20), barring any significant acute changes in the patient's overall condition, it is anticipated that she will be able to proceed with the planned surgical intervention. Any acute changes in clinical condition may necessitate her procedure being postponed and/or cancelled. Pre-surgical instructions were reviewed with the patient during her PAT appointment and questions were fielded by PAT clinical staff.  Honor Loh, MSN, APRN, FNP-C, CEN [facility]  Peri-operative Services Nurse Practitioner Phone: [phone] 12/09/20 9:04 AM  NOTE: This note has been prepared using Dragon dictation software. Despite my best ability to proofread, there is always the potential that unintentional transcriptional errors may still occur from this process.

## 2020-12-10 ENCOUNTER — Telehealth: Payer: Self-pay

## 2020-12-10 DIAGNOSIS — Z87440 Personal history of urinary (tract) infections: Secondary | ICD-10-CM

## 2020-12-10 LAB — CULTURE, URINE COMPREHENSIVE

## 2020-12-10 MED ORDER — SULFAMETHOXAZOLE-TRIMETHOPRIM 800-160 MG PO TABS: 1.0000 | ORAL_TABLET | Freq: Two times a day (BID) | ORAL | 0 refills | Status: DC

## 2020-12-10 NOTE — Telephone Encounter (Signed)
Called pt informed her of the information below. Pt gave verbal understanding. RX sent in.  

## 2020-12-10 NOTE — Telephone Encounter (Signed)
-----   Message from Billey Co, MD sent at 12/10/2020  8:09 AM EDT ----- Lets do Bactrim DS twice daily x3 days to sterilize urine before surgery this Friday, thanks  [name], MD 12/10/2020

## 2020-12-11 ENCOUNTER — Other Ambulatory Visit
Admission: RE | Admit: 2020-12-11 | Discharge: 2020-12-11 | Disposition: A | Discharge status: Home / Self Care | Payer: Medicare Other | Source: Ambulatory Visit | Attending: Urology | Admitting: Urology

## 2020-12-11 ENCOUNTER — Other Ambulatory Visit: Payer: Self-pay

## 2020-12-11 ENCOUNTER — Telehealth: Payer: Self-pay

## 2020-12-11 DIAGNOSIS — Z20822 Contact with and (suspected) exposure to covid-19: Secondary | ICD-10-CM | POA: Insufficient documentation

## 2020-12-11 DIAGNOSIS — Z01812 Encounter for preprocedural laboratory examination: Secondary | ICD-10-CM | POA: Diagnosis not present

## 2020-12-11 DIAGNOSIS — N390 Urinary tract infection, site not specified: Secondary | ICD-10-CM

## 2020-12-11 DIAGNOSIS — B9620 Unspecified Escherichia coli [E. coli] as the cause of diseases classified elsewhere: Secondary | ICD-10-CM

## 2020-12-11 MED ORDER — NITROFURANTOIN MONOHYD MACRO 100 MG PO CAPS: 100.0000 mg | ORAL_CAPSULE | Freq: Two times a day (BID) | ORAL | 0 refills | Status: AC

## 2020-12-11 NOTE — Telephone Encounter (Signed)
Called pt informed her of the information below. Pt gave verbal understanding. Bactrim RX discontinued. Macrobid RX sent in.

## 2020-12-11 NOTE — Telephone Encounter (Signed)
-----   Message from Billey Co, MD sent at 12/11/2020  8:17 AM EDT ----- Culture returned resistant to Bactrim, lets change her to nitrofurantoin 100 mg twice daily x5 days, thanks  [name], MD 12/11/2020

## 2020-12-12 ENCOUNTER — Telehealth: Payer: Self-pay | Admitting: Urology

## 2020-12-12 LAB — SARS CORONAVIRUS 2 (TAT 6-24 HRS): SARS Coronavirus 2: NEGATIVE

## 2020-12-12 NOTE — Telephone Encounter (Signed)
Pt. Left voice mail asking for a return phone call, she  would like for someone to call her to discuss pain medication and getting that prescription before surgery on 12/13/20 and to discuss the dosage. She would like to discuss medication for her spasms as well.

## 2020-12-12 NOTE — Telephone Encounter (Signed)
Called pt, daughter answers and states that pt is sleeping. Per DPR had lengthy discussion with daughter in regards to how pt's pain will be managed post operatively as well as management of catheter and bladder spasm. Advised pt that provider will review all issues tomorrow during pre op.

## 2020-12-13 ENCOUNTER — Other Ambulatory Visit: Payer: Self-pay

## 2020-12-13 ENCOUNTER — Ambulatory Visit: Payer: Medicare Other | Admitting: Urgent Care

## 2020-12-13 ENCOUNTER — Encounter
Admission: RE | Disposition: A | Discharge status: Home / Self Care | Payer: Self-pay | Source: Home / Self Care | Attending: Urology

## 2020-12-13 ENCOUNTER — Ambulatory Visit
Admission: RE | Admit: 2020-12-13 | Discharge: 2020-12-13 | Disposition: A | Discharge status: Home / Self Care | Payer: Medicare Other | Attending: Urology | Admitting: Urology

## 2020-12-13 DIAGNOSIS — C672 Malignant neoplasm of lateral wall of bladder: Secondary | ICD-10-CM | POA: Diagnosis not present

## 2020-12-13 DIAGNOSIS — F17210 Nicotine dependence, cigarettes, uncomplicated: Secondary | ICD-10-CM | POA: Diagnosis not present

## 2020-12-13 DIAGNOSIS — I10 Essential (primary) hypertension: Secondary | ICD-10-CM | POA: Diagnosis not present

## 2020-12-13 DIAGNOSIS — Z955 Presence of coronary angioplasty implant and graft: Secondary | ICD-10-CM | POA: Insufficient documentation

## 2020-12-13 DIAGNOSIS — I2510 Atherosclerotic heart disease of native coronary artery without angina pectoris: Secondary | ICD-10-CM | POA: Insufficient documentation

## 2020-12-13 DIAGNOSIS — I25118 Atherosclerotic heart disease of native coronary artery with other forms of angina pectoris: Secondary | ICD-10-CM | POA: Diagnosis not present

## 2020-12-13 DIAGNOSIS — C679 Malignant neoplasm of bladder, unspecified: Secondary | ICD-10-CM | POA: Diagnosis not present

## 2020-12-13 DIAGNOSIS — K219 Gastro-esophageal reflux disease without esophagitis: Secondary | ICD-10-CM | POA: Diagnosis not present

## 2020-12-13 DIAGNOSIS — I7300 Raynaud's syndrome without gangrene: Secondary | ICD-10-CM | POA: Insufficient documentation

## 2020-12-13 DIAGNOSIS — E782 Mixed hyperlipidemia: Secondary | ICD-10-CM | POA: Diagnosis not present

## 2020-12-13 DIAGNOSIS — E039 Hypothyroidism, unspecified: Secondary | ICD-10-CM | POA: Insufficient documentation

## 2020-12-13 DIAGNOSIS — Z888 Allergy status to other drugs, medicaments and biological substances status: Secondary | ICD-10-CM | POA: Diagnosis not present

## 2020-12-13 DIAGNOSIS — J449 Chronic obstructive pulmonary disease, unspecified: Secondary | ICD-10-CM | POA: Insufficient documentation

## 2020-12-13 DIAGNOSIS — Z881 Allergy status to other antibiotic agents status: Secondary | ICD-10-CM | POA: Diagnosis not present

## 2020-12-13 DIAGNOSIS — E785 Hyperlipidemia, unspecified: Secondary | ICD-10-CM | POA: Diagnosis not present

## 2020-12-13 HISTORY — PX: TRANSURETHRAL RESECTION OF BLADDER TUMOR: SHX2575

## 2020-12-13 HISTORY — DX: Chronic obstructive pulmonary disease, unspecified: J44.9

## 2020-12-13 HISTORY — DX: Anxiety disorder, unspecified: F41.9

## 2020-12-13 SURGERY — TURBT (TRANSURETHRAL RESECTION OF BLADDER TUMOR)
Anesthesia: General

## 2020-12-13 MED ORDER — CHLORHEXIDINE GLUCONATE 0.12 % MT SOLN: 15.0000 mL | Freq: Once | OROMUCOSAL | Status: AC

## 2020-12-13 MED ORDER — LIDOCAINE HCL (PF) 2 % IJ SOLN
INTRAMUSCULAR | Status: AC
  Filled 2020-12-13: qty 5

## 2020-12-13 MED ORDER — OXYCODONE HCL 5 MG/5ML PO SOLN: 5.0000 mg | Freq: Once | ORAL | Status: DC | PRN

## 2020-12-13 MED ORDER — LACTATED RINGERS IV SOLN: INTRAVENOUS | Status: DC

## 2020-12-13 MED ORDER — OXYCODONE HCL 5 MG PO TABS: 5.0000 mg | ORAL_TABLET | Freq: Once | ORAL | Status: DC | PRN

## 2020-12-13 MED ORDER — CHLORHEXIDINE GLUCONATE 0.12 % MT SOLN
OROMUCOSAL | Status: AC
  Administered 2020-12-13: 15 mL via OROMUCOSAL
  Filled 2020-12-13: qty 15

## 2020-12-13 MED ORDER — FENTANYL CITRATE (PF) 100 MCG/2ML IJ SOLN
25.0000 ug | INTRAMUSCULAR | Status: DC | PRN
  Administered 2020-12-13: 25 ug via INTRAVENOUS

## 2020-12-13 MED ORDER — ONDANSETRON HCL 4 MG/2ML IJ SOLN: 4.0000 mg | Freq: Once | INTRAMUSCULAR | Status: DC | PRN

## 2020-12-13 MED ORDER — SUCCINYLCHOLINE CHLORIDE 20 MG/ML IJ SOLN
INTRAMUSCULAR | Status: DC | PRN
  Administered 2020-12-13: 100 mg via INTRAVENOUS

## 2020-12-13 MED ORDER — EPHEDRINE 5 MG/ML INJ
INTRAVENOUS | Status: AC
  Filled 2020-12-13: qty 10

## 2020-12-13 MED ORDER — FENTANYL CITRATE (PF) 100 MCG/2ML IJ SOLN
INTRAMUSCULAR | Status: AC
  Administered 2020-12-13: 25 ug via INTRAVENOUS
  Filled 2020-12-13: qty 2

## 2020-12-13 MED ORDER — FENTANYL CITRATE (PF) 100 MCG/2ML IJ SOLN
INTRAMUSCULAR | Status: AC
  Filled 2020-12-13: qty 2

## 2020-12-13 MED ORDER — ONDANSETRON HCL 4 MG/2ML IJ SOLN
INTRAMUSCULAR | Status: DC | PRN
  Administered 2020-12-13: 4 mg via INTRAVENOUS

## 2020-12-13 MED ORDER — ONDANSETRON HCL 4 MG/2ML IJ SOLN
INTRAMUSCULAR | Status: AC
  Filled 2020-12-13: qty 2

## 2020-12-13 MED ORDER — DEXAMETHASONE SODIUM PHOSPHATE 10 MG/ML IJ SOLN
INTRAMUSCULAR | Status: AC
  Filled 2020-12-13: qty 1

## 2020-12-13 MED ORDER — LIDOCAINE HCL (CARDIAC) PF 100 MG/5ML IV SOSY
PREFILLED_SYRINGE | INTRAVENOUS | Status: DC | PRN
  Administered 2020-12-13: 80 mg via INTRAVENOUS

## 2020-12-13 MED ORDER — ORAL CARE MOUTH RINSE: 15.0000 mL | Freq: Once | OROMUCOSAL | Status: AC

## 2020-12-13 MED ORDER — DEXAMETHASONE SODIUM PHOSPHATE 10 MG/ML IJ SOLN
INTRAMUSCULAR | Status: DC | PRN
  Administered 2020-12-13: 10 mg via INTRAVENOUS

## 2020-12-13 MED ORDER — PROPOFOL 10 MG/ML IV BOLUS
INTRAVENOUS | Status: DC | PRN
  Administered 2020-12-13: 110 mg via INTRAVENOUS

## 2020-12-13 MED ORDER — SUGAMMADEX SODIUM 200 MG/2ML IV SOLN
INTRAVENOUS | Status: DC | PRN
  Administered 2020-12-13: 200 mg via INTRAVENOUS

## 2020-12-13 MED ORDER — ROCURONIUM BROMIDE 10 MG/ML (PF) SYRINGE
PREFILLED_SYRINGE | INTRAVENOUS | Status: AC
  Filled 2020-12-13: qty 10

## 2020-12-13 MED ORDER — LABETALOL HCL 5 MG/ML IV SOLN
INTRAVENOUS | Status: DC | PRN
  Administered 2020-12-13: 5 mg via INTRAVENOUS

## 2020-12-13 MED ORDER — FENTANYL CITRATE (PF) 100 MCG/2ML IJ SOLN
INTRAMUSCULAR | Status: DC | PRN
  Administered 2020-12-13 (×2): 25 ug via INTRAVENOUS

## 2020-12-13 MED ORDER — PROPOFOL 500 MG/50ML IV EMUL
INTRAVENOUS | Status: AC
  Filled 2020-12-13: qty 50

## 2020-12-13 MED ORDER — HYDROCODONE-ACETAMINOPHEN 5-325 MG PO TABS: 1.0000 | ORAL_TABLET | Freq: Four times a day (QID) | ORAL | 0 refills | Status: AC | PRN

## 2020-12-13 MED ORDER — ROCURONIUM BROMIDE 100 MG/10ML IV SOLN
INTRAVENOUS | Status: DC | PRN
  Administered 2020-12-13: 40 mg via INTRAVENOUS

## 2020-12-13 SURGICAL SUPPLY — 32 items
BAG DRAIN CYSTO-URO LG1000N (MISCELLANEOUS) ×2 IMPLANT
BAG DRN RND TRDRP ANRFLXCHMBR (UROLOGICAL SUPPLIES)
BAG URINE DRAIN 2000ML AR STRL (UROLOGICAL SUPPLIES) ×1 IMPLANT
BRUSH SCRUB EZ  4% CHG (MISCELLANEOUS) ×2
BRUSH SCRUB EZ 4% CHG (MISCELLANEOUS) ×1 IMPLANT
CATH FOL 2WAY LX 18X30 (CATHETERS) ×1 IMPLANT
DRAPE UTILITY 15X26 TOWEL STRL (DRAPES) ×2 IMPLANT
DRSG TELFA 4X3 1S NADH ST (GAUZE/BANDAGES/DRESSINGS) ×2 IMPLANT
ELECT LOOP 22F BIPOLAR SML (ELECTROSURGICAL)
ELECT REM PT RETURN 9FT ADLT (ELECTROSURGICAL)
ELECTRODE LOOP 22F BIPOLAR SML (ELECTROSURGICAL) IMPLANT
ELECTRODE REM PT RTRN 9FT ADLT (ELECTROSURGICAL) IMPLANT
GLOVE SURG UNDER POLY LF SZ7.5 (GLOVE) ×2 IMPLANT
GOWN STRL REUS W/ TWL LRG LVL3 (GOWN DISPOSABLE) ×1 IMPLANT
GOWN STRL REUS W/ TWL XL LVL3 (GOWN DISPOSABLE) ×1 IMPLANT
GOWN STRL REUS W/TWL LRG LVL3 (GOWN DISPOSABLE) ×2
GOWN STRL REUS W/TWL XL LVL3 (GOWN DISPOSABLE) ×2
KIT TURNOVER CYSTO (KITS) ×2 IMPLANT
LOOP CUT BIPOLAR 24F LRG (ELECTROSURGICAL) ×1 IMPLANT
MANIFOLD NEPTUNE II (INSTRUMENTS) ×1 IMPLANT
NDL SAFETY ECLIPSE 18X1.5 (NEEDLE) ×1 IMPLANT
NEEDLE HYPO 18GX1.5 SHARP (NEEDLE)
PACK CYSTO AR (MISCELLANEOUS) ×2 IMPLANT
PAD ARMBOARD 7.5X6 YLW CONV (MISCELLANEOUS) ×2 IMPLANT
SET IRRIG Y TYPE TUR BLADDER L (SET/KITS/TRAYS/PACK) ×2 IMPLANT
SOL .9 NS 3000ML IRR  AL (IV SOLUTION) ×6
SOL .9 NS 3000ML IRR AL (IV SOLUTION) ×3
SOL .9 NS 3000ML IRR UROMATIC (IV SOLUTION) IMPLANT
SURGILUBE 2OZ TUBE FLIPTOP (MISCELLANEOUS) ×2 IMPLANT
SYR TOOMEY IRRIG 70ML (MISCELLANEOUS) ×2
SYRINGE TOOMEY IRRIG 70ML (MISCELLANEOUS) ×1 IMPLANT
WATER STERILE IRR 1000ML POUR (IV SOLUTION) ×2 IMPLANT

## 2020-12-13 NOTE — Anesthesia Preprocedure Evaluation (Addendum)
Anesthesia Evaluation  Patient identified by MRN, date of birth, ID band Patient awake    Reviewed: Allergy & Precautions, H&P , NPO status , Patient's Chart, lab work & pertinent test results  History of Anesthesia Complications Negative for: history of anesthetic complications  Airway Mallampati: II  TM Distance: <3 FB   Mouth opening: Limited Mouth Opening  Dental  (+) Edentulous Upper, Edentulous Lower   Pulmonary asthma , neg sleep apnea, COPD, Current Smoker,    breath sounds clear to auscultation       Cardiovascular hypertension, (-) angina+ CAD  (-) Past MI and (-) Cardiac Stents (-) dysrhythmias  Rhythm:regular Rate:Normal     Neuro/Psych PSYCHIATRIC DISORDERS Anxiety Depression negative neurological ROS     GI/Hepatic Neg liver ROS, GERD  ,  Endo/Other  Hypothyroidism   Renal/GU      Musculoskeletal   Abdominal   Peds  Hematology negative hematology ROS (+)   Anesthesia Other Findings Past Medical History: No date: Allergies No date: Allergy No date: Angina pectoris, unspecified ([place]) No date: Anxiety No date: Asthma February 2007: CAD S/P percutaneous coronary angioplasty     Comment:  Class III Angina --> Cath: 85% -- PCI 3.86mm x 68mm (4.0               mm) Cypher DES to RCA; Myoview October 2014: normal               stress test: LOW RISK; mild anteroapical breast               attenuation No date: Cataract     Comment:  h/o bilat repair 06/01/2013: Claudication ([place]) No date: COPD (chronic obstructive pulmonary disease) ([place]) 11/29/2013: Depression No date: Emphysema of lung ([facility]) No date: GERD (gastroesophageal reflux disease) No date: History of palpitations No date: History of tobacco abuse No date: Hyperlipidemia No date: Hypertension     Comment:  well-controlled No date: Hypothyroidism No date: Osteoporosis No date: Raynaud disease  Past Surgical History: No date: ABDOMINAL  HYSTERECTOMY     Comment:  h/o partila hysterectomy 1988: BREAST BIOPSY 10/14/2005: CORONARY ANGIOPLASTY WITH STENT PLACEMENT     Comment:  cypher DES (3.33mmx18mm) to high grade RCA lesion July 2012 of October 2014: NM MYOVIEW LTD     Comment:  '12: dipyridamole; Normal, low risk study; 2014 - normal              stress test: LOW RISK; mild anteroapical breast               attenuation 1959: TONSILLECTOMY 05/22/2009: TRANSTHORACIC ECHOCARDIOGRAM     Comment:  EF=>55%, normal LV systolic function; normal RV systolic              function; mild MR/TR 11/08/2020: TRANSURETHRAL RESECTION OF BLADDER TUMOR; N/A     Comment:  Procedure: TRANSURETHRAL RESECTION OF BLADDER TUMOR               (TURBT);  Surgeon: Billey Co, MD;  Location: [facility]               ORS;  Service: Urology;  Laterality: N/A;  BMI    Body Mass Index: 32.01 kg/m      Reproductive/Obstetrics negative OB ROS                            Anesthesia Physical Anesthesia Plan  ASA: III  Anesthesia Plan: General ETT   Post-op  Pain Management:    Induction:   PONV Risk Score and Plan: Ondansetron, Dexamethasone and Treatment may vary due to age or medical condition  Airway Management Planned: Video Laryngoscope Planned  Additional Equipment:   Intra-op Plan:   Post-operative Plan:   Informed Consent: I have reviewed the patients History and Physical, chart, labs and discussed the procedure including the risks, benefits and alternatives for the proposed anesthesia with the patient or authorized representative who has indicated his/her understanding and acceptance.     Dental Advisory Given  Plan Discussed with: Anesthesiologist, CRNA and Surgeon  Anesthesia Plan Comments:        Anesthesia Quick Evaluation

## 2020-12-13 NOTE — Anesthesia Procedure Notes (Signed)
Procedure Name: Intubation Date/Time: 12/13/2020 11:34 AM Performed by: Zetta Bills, CRNA Pre-anesthesia Checklist: Patient identified, Emergency Drugs available, Patient being monitored and Suction available Patient Re-evaluated:Patient Re-evaluated prior to induction Oxygen Delivery Method: Circle system utilized Preoxygenation: Pre-oxygenation with 100% oxygen Induction Type: IV induction Ventilation: Mask ventilation without difficulty Laryngoscope Size: McGraph and 3 Grade View: Grade I Tube type: Oral Tube size: 7.0 mm Number of attempts: 1 Airway Equipment and Method: Video-laryngoscopy

## 2020-12-13 NOTE — Anesthesia Postprocedure Evaluation (Signed)
Anesthesia Post Note  Patient: Alexa Orozco  Procedure(s) Performed: TRANSURETHRAL RESECTION OF BLADDER TUMOR (TURBT) (N/A )  Patient location during evaluation: PACU Anesthesia Type: General Level of consciousness: awake and alert Pain management: pain level controlled Vital Signs Assessment: post-procedure vital signs reviewed and stable Respiratory status: spontaneous breathing, nonlabored ventilation and respiratory function stable Cardiovascular status: blood pressure returned to baseline and stable Postop Assessment: no apparent nausea or vomiting Anesthetic complications: no   No complications documented.   Last Vitals:  Vitals:   12/13/20 1315 12/13/20 1318  BP:  (!) 153/66  Pulse: (!) 54 (!) 52  Resp: 11 16  Temp:  36.7 C  SpO2: 100% 99%    Last Pain:  Vitals:   12/13/20 1318  TempSrc:   PainSc: 2                  Tera Mater

## 2020-12-13 NOTE — Transfer of Care (Signed)
Immediate Anesthesia Transfer of Care Note  Patient: Alexa Orozco  Procedure(s) Performed: TRANSURETHRAL RESECTION OF BLADDER TUMOR (TURBT) (N/A )  Patient Location: PACU  Anesthesia Type:General  Level of Consciousness: awake, alert  and oriented  Airway & Oxygen Therapy: Patient Spontanous Breathing and Patient connected to nasal cannula oxygen  Post-op Assessment: Report given to RN and Post -op Vital signs reviewed and stable  Post vital signs: Reviewed and stable  Last Vitals:  Vitals Value Taken Time  BP 175/80 12/13/20 1222  Temp    Pulse 53 12/13/20 1226  Resp 13 12/13/20 1226  SpO2 100 % 12/13/20 1226  Vitals shown include unvalidated device data.  Last Pain:  Vitals:   12/13/20 0941  TempSrc: Tympanic  PainSc: 0-No pain         Complications: No complications documented.

## 2020-12-13 NOTE — Op Note (Addendum)
Date of procedure: 12/13/20  Preoperative diagnosis:  1. Bladder cancer  Postoperative diagnosis:  1. Same  Procedure: 1. TURBT >5cm  Surgeon: [name], MD  Anesthesia: General  Complications: None  Intraoperative findings:  1.  Small amount of residual papillary tumor at the edges of prior resection, entire tumor base re-resected measuring ~7cm and encompassing right side of bladder wrapping to the dome  EBL: Minimal  Specimens: Bladder tumor superficial, bladder tumor deep  Drains: None  Indication: Alexa Orozco is a 75 y.o. 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  She presents today for second look repeat TURBT.  After reviewing the management options for treatment, they elected to proceed with the above surgical procedure(s). We have discussed the potential benefits and risks of the procedure, side effects of the proposed treatment, the likelihood of the patient achieving the goals of the procedure, and any potential problems that might occur during the procedure or recuperation. Informed consent has been obtained.  Description of procedure:  The patient was taken to the operating room and general anesthesia was induced. SCDs were placed for DVT prophylaxis. The patient was placed in the dorsal lithotomy position, prepped and draped in the usual sterile fashion, and preoperative antibiotics were administered. A preoperative time-out was performed.   The 83 French resectoscope with a visual obturator was used to enter the bladder.  Thorough cystoscopy revealed the prior resection site at the right lateral wall wrapping up to the dome measuring around 7 cm.  There was residual papillary tumor at the edges posteriorly measuring 5-6cm.  Using the large resecting loop the entire tumor base was completely re-resected down to muscle.  No tumor remained at  the conclusion of the case.  The cold cup biopsy forceps were used to take a deep biopsy at the base of the tumor and sent as deep bladder tumor.  The loop was then used for meticulous hemostasis.  There was excellent hemostasis with the bladder decompressed.  The ureteral orifices were well away from the resection site.  Disposition: Stable to PACU  Plan: Follow-up to discuss pathology results-> pursue induction BCG if non-muscle invasive disease  [name], MD

## 2020-12-13 NOTE — Discharge Instructions (Signed)

## 2020-12-13 NOTE — H&P (Signed)
12/13/20 11:08 AM   Alexa Orozco Alexa Orozco 1946/07/27 [id number]  CC: Bladder cancer  HPI: She is a 75 year old female who presented with severe urinary symptoms of urgency, frequency, pelvic pain, and CT showed a large bladder tumor involving the majority of the right side of the bladder and dome.  There was no definite evidence of metastatic disease on CT scan.  She underwent TURBT on 11/08/2020 and >90% of tumor was resected.  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   We had a long conversation about her diagnosis of non-muscle invasive bladder cancer.  We reviewed the AUA guidelines regarding treatment strategies, and the need for repeat TURBT in 4 to 6 weeks from her original surgery.  We discussed the ~30% chance of upstaging to muscle invasive disease on repeat TURBT, as well as likely residual tumor based on her extensive tumor on initial presentation.  We also discussed BCG as the primary treatment option if she is confirmed to have nonmuscle invasive disease on repeat TURBT.    PMH: Past Medical History:  Diagnosis Date  . Allergies   . Allergy   . Angina pectoris, unspecified ([place])   . Anxiety   . Asthma   . CAD S/P percutaneous coronary angioplasty February 2007   Class III Angina --> Cath: 85% -- PCI 3.14mm x 66mm (4.0 mm) Cypher DES to RCA; Myoview October 2014: normal stress test: LOW RISK; mild anteroapical breast attenuation  . Cataract    h/o bilat repair  . Claudication ([place]) 06/01/2013  . COPD (chronic obstructive pulmonary disease) ([place])   . Depression 11/29/2013  . Emphysema of lung ([place])   . GERD (gastroesophageal reflux disease)   . History of palpitations   . History of tobacco abuse   . Hyperlipidemia   . Hypertension    well-controlled  . Hypothyroidism   . Osteoporosis   . Raynaud disease     Surgical History: Past Surgical History:  Procedure Laterality Date  . ABDOMINAL HYSTERECTOMY      h/o partila hysterectomy  . BREAST BIOPSY  1988  . CORONARY ANGIOPLASTY WITH STENT PLACEMENT  10/14/2005   cypher DES (3.72mmx18mm) to high grade RCA lesion  . NM MYOVIEW LTD  July 2012 of October 2014   '12: dipyridamole; Normal, low risk study; 2014 - normal stress test: LOW RISK; mild anteroapical breast attenuation  . TONSILLECTOMY  1959  . TRANSTHORACIC ECHOCARDIOGRAM  05/22/2009   EF=>55%, normal LV systolic function; normal RV systolic function; mild MR/TR  . TRANSURETHRAL RESECTION OF BLADDER TUMOR N/A 11/08/2020   Procedure: TRANSURETHRAL RESECTION OF BLADDER TUMOR (TURBT);  Surgeon: Billey Co, MD;  Location: [facility] ORS;  Service: Urology;  Laterality: N/A;      Family History: Family History  Problem Relation Age of Onset  . Liver cancer Mother   . Heart attack Mother   . Heart Problems Sister   . Lupus Sister   . Liver cancer Brother   . Asthma Brother   . Breast cancer Neg Hx   . Prostate cancer Neg Hx   . Bladder Cancer Neg Hx   . Kidney cancer Neg Hx     Social History:  reports that she has been smoking cigarettes. She has a 17.50 pack-year smoking history. She has never used smokeless tobacco. She reports that she does not drink alcohol and does not use drugs.  Physical Exam: BP (!) 162/67   Pulse (!) 56   Temp (!)  97.5 F (36.4 C) (Tympanic)   Resp 18   Wt 79.4 kg   SpO2 100%   BMI 32.01 kg/m    Constitutional:  Alert and oriented, No acute distress. Cardiovascular: Regular rate and rhythm Respiratory: Clear to auscultation bilaterally GI: Abdomen is soft, nontender, nondistended, no abdominal masses  Laboratory Data: Urine culture 4/6 with E. coli and Streptococcus, on culture appropriate nitrofurantoin  Assessment & Plan:   74 year old female with recent diagnosis of HG T1 urothelial cell carcinoma, muscle was present and not involved in the original specimen.  Here today for second look TURBT.  We discussed transurethral resection of bladder  tumor (TURBT) and risks and benefits at length. This is typically a 1 to 2-hour procedure done under general anesthesia in the operating room.  A scope is inserted through the urethra and used to resect abnormal tissue within the bladder, which is then sent to the pathologist to determine grade and stage of the tumor.  Risks include bleeding, infection, need for temporary Foley placement, and bladder perforation.  Treatment strategies are based on the type of tumor and depth of invasion.  We briefly reviewed the different treatment pathways for non-muscle invasive and muscle invasive bladder cancer.  Second look TURBT today  [name], MD 12/13/2020  [place] [address] [place], [place] [address] [phone]

## 2020-12-14 ENCOUNTER — Encounter: Payer: Self-pay | Admitting: Urology

## 2020-12-17 LAB — SURGICAL PATHOLOGY

## 2020-12-18 ENCOUNTER — Telehealth: Payer: Self-pay | Admitting: Family Medicine

## 2020-12-18 NOTE — Telephone Encounter (Signed)
Received fax requesting refill on albuterol (VENTOLIN HFA) 108 (90 Base) MCG/ACT inhaler  [facility] [address]

## 2020-12-19 ENCOUNTER — Telehealth: Payer: Self-pay | Admitting: Urology

## 2020-12-19 MED ORDER — ALBUTEROL SULFATE HFA 108 (90 BASE) MCG/ACT IN AERS
2.0000 | INHALATION_SPRAY | Freq: Four times a day (QID) | RESPIRATORY_TRACT | 2 refills | Status: DC | PRN
Start: 2020-12-19 — End: 2021-01-24

## 2020-12-19 NOTE — Telephone Encounter (Signed)
Recommend UA and culture to rule out infection and evaluate need for antibiotics.  I think she lives far away so if it is easier for her to get with PCP that would be fine to them and they can forward results to [place].   She had extensive resection so some of this may be just bladder healing.  We could also consider an OAB medication if urine is negative.  [name], MD 12/19/2020

## 2020-12-19 NOTE — Telephone Encounter (Signed)
Pt asks for a refill on her antibiotics, states she thinks she still has bacteria because burning started back about 2 days ago, some low back pain that has been persistent since "this" started pt still wetting thru pads and clothes several times a day, every time she walks she has to urinate and can't make it to restroom.  Denies fever/chills/nausea/vomiting, please advise

## 2020-12-20 NOTE — Telephone Encounter (Signed)
Called pt, no answer. Left detailed voicemail for pt informing her of the information below. Advised pt to call back to schedule.

## 2020-12-23 ENCOUNTER — Other Ambulatory Visit: Payer: Self-pay

## 2020-12-23 ENCOUNTER — Other Ambulatory Visit: Payer: Medicare Other

## 2020-12-23 ENCOUNTER — Telehealth: Payer: Self-pay | Admitting: Family Medicine

## 2020-12-23 ENCOUNTER — Telehealth: Payer: Self-pay | Admitting: *Deleted

## 2020-12-23 DIAGNOSIS — Z87440 Personal history of urinary (tract) infections: Secondary | ICD-10-CM

## 2020-12-23 DIAGNOSIS — R300 Dysuria: Secondary | ICD-10-CM

## 2020-12-23 LAB — URINALYSIS, ROUTINE W REFLEX MICROSCOPIC
Glucose, UA: NEGATIVE
Ketones, ur: NEGATIVE
Nitrite: POSITIVE — AB
Specific Gravity, Urine: 1.025 (ref 1.001–1.035)
pH: 6.5 (ref 5.0–8.0)

## 2020-12-23 LAB — MICROSCOPIC MESSAGE

## 2020-12-23 NOTE — Telephone Encounter (Signed)
Called Patient PCP and requested for urine and urine culture to be faxed to [place]. [phone] PCP Number

## 2020-12-24 ENCOUNTER — Other Ambulatory Visit: Payer: Self-pay | Admitting: Family Medicine

## 2020-12-24 LAB — URINE CULTURE
MICRO NUMBER:: 11809272
SPECIMEN QUALITY:: ADEQUATE

## 2020-12-24 MED ORDER — SULFAMETHOXAZOLE-TRIMETHOPRIM 800-160 MG PO TABS: 1.0000 | ORAL_TABLET | Freq: Two times a day (BID) | ORAL | 0 refills | Status: DC

## 2020-12-24 NOTE — Progress Notes (Signed)
Sent fax of pt labs to [facility]. Pt is aware to pick up med for uti

## 2020-12-25 ENCOUNTER — Ambulatory Visit: Payer: Medicare Other | Admitting: Urology

## 2020-12-25 ENCOUNTER — Other Ambulatory Visit: Payer: Self-pay | Admitting: Family Medicine

## 2020-12-25 DIAGNOSIS — Z1231 Encounter for screening mammogram for malignant neoplasm of breast: Secondary | ICD-10-CM

## 2020-12-26 ENCOUNTER — Ambulatory Visit: Payer: Medicare Other | Admitting: Urology

## 2020-12-27 ENCOUNTER — Other Ambulatory Visit: Payer: Self-pay | Admitting: Family Medicine

## 2020-12-27 NOTE — Telephone Encounter (Signed)
Ok to refill??  Last office visit 10/03/2020.  Last refill 08/29/2020, #3 refills.

## 2021-01-01 ENCOUNTER — Other Ambulatory Visit: Payer: Self-pay

## 2021-01-01 ENCOUNTER — Telehealth (INDEPENDENT_AMBULATORY_CARE_PROVIDER_SITE_OTHER): Payer: Medicare Other | Admitting: Urology

## 2021-01-01 DIAGNOSIS — N3281 Overactive bladder: Secondary | ICD-10-CM

## 2021-01-01 DIAGNOSIS — C679 Malignant neoplasm of bladder, unspecified: Secondary | ICD-10-CM | POA: Diagnosis not present

## 2021-01-01 MED ORDER — OXYBUTYNIN CHLORIDE ER 10 MG PO TB24: 10.0000 mg | ORAL_TABLET | Freq: Every day | ORAL | 11 refills | Status: DC

## 2021-01-01 NOTE — Progress Notes (Signed)
Virtual Visit via Telephone Note  I connected with Alexa Orozco and her daughter on 01/01/21 at 11:15 AM EDT by telephone and verified that I am speaking with the correct person using two identifiers.   Patient location: Home Provider location: [facility]   I discussed the limitations, risks, security and privacy concerns of performing an evaluation and management service by telephone and the availability of in person appointments. We discussed the impact of the COVID-19 pandemic on the healthcare system, and the importance of social distancing and reducing patient and provider exposure. I also discussed with the patient that there may be a patient responsible charge related to this service. The patient expressed understanding and agreed to proceed.  Reason for visit: Discuss pathology  History of Present Illness: 75 year old female who originally presented with a large bladder tumor, and TURBT showed high-grade T1 urothelial cell carcinoma.  Second look TURBT on 12/13/2020 showed residual HG T1 disease, and muscle was present and not involved with tumor.  We reviewed options at length again, and my recommendation for induction BCG and close surveillance cystoscopy.  We discussed high risk of recurrence.  I discussed smoking cessation at length.  She is having some persistent urgency, frequency, and urge incontinence.  Recent urine culture with PCP was negative.  I recommended a trial of oxybutynin 10 mg XL as her bladder heals.  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 The patient was provided an opportunity to ask questions and all were  answered. The patient agreed with the plan and demonstrated an understanding of the instructions.   The patient was advised to call back or seek an in-person evaluation if the symptoms worsen or if the condition fails to improve as anticipated.  I provided 13 minutes of non-face-to-face time during this encounter.   Billey Co, MD

## 2021-01-01 NOTE — Progress Notes (Signed)
Subjective:   Alexa Orozco is a 75 y.o. female who presents for Medicare Annual (Subsequent) preventive examination.  I connected with Alexa Orozco today by telephone and verified that I am speaking with the correct person using two identifiers. Location patient: home Location provider: work Persons participating in the virtual visit: patient, provider.   I discussed the limitations, risks, security and privacy concerns of performing an evaluation and management service by telephone and the availability of in person appointments. I also discussed with the patient that there may be a patient responsible charge related to this service. The patient expressed understanding and verbally consented to this telephonic visit.    Interactive audio and video telecommunications were attempted between this provider and patient, however failed, due to patient having technical difficulties OR patient did not have access to video capability.  We continued and completed visit with audio only.      Review of Systems    N/A  Cardiac Risk Factors include: advanced age (>68men, >62 women)     Objective:    Today's Vitals   01/02/21 1159  PainSc: 0-No pain   There is no height or weight on file to calculate BMI.  Advanced Directives 01/02/2021 12/13/2020 12/02/2020 11/08/2020 05/19/2017 03/22/2017 06/22/2016  Does Patient Have a Medical Advance Directive? Yes Yes Yes No No No No  Type of Paramedic of [place];Living will [place]  Does patient want to make changes to medical advance directive? No - Patient declined No - Patient declined - - - - -  Copy of [place] in Chart? No - copy requested No - copy requested - - - - -  Would patient like information on creating a medical advance directive? - - - No - Patient declined - - -    Current Medications (verified) Outpatient Encounter Medications as of 01/02/2021  Medication Sig  .  acetaminophen (TYLENOL) 500 MG tablet Take 500-1,000 mg by mouth every 6 (six) hours as needed (pain).  Marland Kitchen albuterol (PROVENTIL) (2.5 MG/3ML) 0.083% nebulizer solution Take 3 mLs (2.5 mg total) by nebulization every 6 (six) hours as needed for wheezing or shortness of breath.  Marland Kitchen albuterol (VENTOLIN HFA) 108 (90 Base) MCG/ACT inhaler Inhale 2 puffs into the lungs every 6 (six) hours as needed for wheezing or shortness of breath. Patients insurance requires CFC-free aerosol.  Please dispense only that  . clonazePAM (KLONOPIN) 0.5 MG tablet Take 0.5-1 tablets (0.25-0.5 mg total) by mouth daily as needed for anxiety. (Patient taking differently: Take 0.5 mg by mouth daily as needed for anxiety.)  . clopidogrel (PLAVIX) 75 MG tablet Take 75 mg by mouth daily.  . cromolyn (OPTICROM) 4 % ophthalmic solution Place 1 drop into both eyes 4 (four) times daily.  . Eszopiclone 3 MG TABS TAKE 1 TABLET BY MOUTH IMMEDIATELY BEFORE BEDTIME  . levothyroxine (SYNTHROID) 88 MCG tablet Take 1 tablet (88 mcg total) by mouth daily. (Patient taking differently: Take 88 mcg by mouth daily before breakfast.)  . montelukast (SINGULAIR) 10 MG tablet TAKE 1 TABLET BY MOUTH EVERYDAY AT BEDTIME (Patient taking differently: Take 10 mg by mouth at bedtime.)  . oxybutynin (DITROPAN-XL) 10 MG 24 hr tablet Take 1 tablet (10 mg total) by mouth daily.  . pantoprazole (PROTONIX) 40 MG tablet Take 1 tablet (40 mg total) by mouth daily. (Patient taking differently: Take 40 mg by mouth every morning.)  . pravastatin (PRAVACHOL) 20 MG tablet TAKE  1 TABLET BY MOUTH EVERY DAY (Patient taking differently: Take 20 mg by mouth every morning.)  . sertraline (ZOLOFT) 100 MG tablet TAKE 1 TABLET BY MOUTH EVERY DAY (Patient taking differently: Take 100 mg by mouth every morning.)  . SPIRIVA RESPIMAT 2.5 MCG/ACT AERS INHALE 2 PUFFS BY MOUTH INTO THE LUNGS DAILY (Patient taking differently: Inhale 2 puffs into the lungs every morning.)  . nitroGLYCERIN  (NITROSTAT) 0.4 MG SL tablet Place 1 tablet (0.4 mg total) under the tongue every 5 (five) minutes as needed for chest pain. (Patient not taking: Reported on 01/02/2021)  . tiZANidine (ZANAFLEX) 4 MG tablet Take 1 tablet (4 mg total) by mouth every 6 (six) hours as needed for muscle spasms. (Patient not taking: No sig reported)  . [DISCONTINUED] sulfamethoxazole-trimethoprim (BACTRIM DS) 800-160 MG tablet Take 1 tablet by mouth 2 (two) times daily.   No facility-administered encounter medications on file as of 01/02/2021.    Allergies (verified) Bystolic [nebivolol hcl], Cephalexin, Effexor [venlafaxine], and Levaquin [levofloxacin in d5w]   History: Past Medical History:  Diagnosis Date  . Allergies   . Allergy   . Angina pectoris, unspecified ([place])   . Anxiety   . Asthma   . CAD S/P percutaneous coronary angioplasty February 2007   Class III Angina --> Cath: 85% -- PCI 3.40mm x 16mm (4.0 mm) Cypher DES to RCA; Myoview October 2014: normal stress test: LOW RISK; mild anteroapical breast attenuation  . Cataract    h/o bilat repair  . Claudication ([place]) 06/01/2013  . COPD (chronic obstructive pulmonary disease) ([place])   . Depression 11/29/2013  . Emphysema of lung ([place])   . GERD (gastroesophageal reflux disease)   . History of palpitations   . History of tobacco abuse   . Hyperlipidemia   . Hypertension    well-controlled  . Hypothyroidism   . Osteoporosis   . Raynaud disease    Past Surgical History:  Procedure Laterality Date  . ABDOMINAL HYSTERECTOMY     h/o partila hysterectomy  . BREAST BIOPSY  1988  . CORONARY ANGIOPLASTY WITH STENT PLACEMENT  10/14/2005   cypher DES (3.45mmx18mm) to high grade RCA lesion  . NM MYOVIEW LTD  July 2012 of October 2014   '12: dipyridamole; Normal, low risk study; 2014 - normal stress test: LOW RISK; mild anteroapical breast attenuation  . TONSILLECTOMY  1959  . TRANSTHORACIC ECHOCARDIOGRAM  05/22/2009   EF=>55%, normal LV systolic function;  normal RV systolic function; mild MR/TR  . TRANSURETHRAL RESECTION OF BLADDER TUMOR N/A 11/08/2020   Procedure: TRANSURETHRAL RESECTION OF BLADDER TUMOR (TURBT);  Surgeon: Billey Co, MD;  Location: [facility] ORS;  Service: Urology;  Laterality: N/A;  . TRANSURETHRAL RESECTION OF BLADDER TUMOR N/A 12/13/2020   Procedure: TRANSURETHRAL RESECTION OF BLADDER TUMOR (TURBT);  Surgeon: Billey Co, MD;  Location: [facility] ORS;  Service: Urology;  Laterality: N/A;   Family History  Problem Relation Age of Onset  . Liver cancer Mother   . Heart attack Mother   . Heart Problems Sister   . Lupus Sister   . Liver cancer Brother   . Asthma Brother   . Breast cancer Neg Hx   . Prostate cancer Neg Hx   . Bladder Cancer Neg Hx   . Kidney cancer Neg Hx    Social History   Socioeconomic History  . Marital status: Widowed    Spouse name: Not on file  . Number of children: 3  . Years of education: 10th   .  Highest education level: Not on file  Occupational History  . Occupation: Retired  Tobacco Use  . Smoking status: Current Every Day Smoker    Packs/day: 0.50    Years: 35.00    Pack years: 17.50    Types: Cigarettes  . Smokeless tobacco: Never Used  . Tobacco comment: 11/11/18 still at 1/2ppd  Vaping Use  . Vaping Use: Never used  Substance and Sexual Activity  . Alcohol use: No  . Drug use: No  . Sexual activity: Not on file  Other Topics Concern  . Not on file  Social History Narrative   She is a widowed mother of 42, grandmother for, great-grandmother 2.  She still smokes about half pack a day.  She doesn't have about 35 years.  She does not drink.   Prior to her symptoms coming on, she used to do all kind of walking around and working in the garden and other activities with her close friend.   Social Determinants of Health   Financial Resource Strain: Low Risk   . Difficulty of Paying Living Expenses: Not hard at all  Food Insecurity: No Food Insecurity  . Worried About Paediatric nurse in the Last Year: Never true  . Ran Out of Food in the Last Year: Never true  Transportation Needs: No Transportation Needs  . Lack of Transportation (Medical): No  . Lack of Transportation (Non-Medical): No  Physical Activity: Inactive  . Days of Exercise per Week: 0 days  . Minutes of Exercise per Session: 0 min  Stress: No Stress Concern Present  . Feeling of Stress : Not at all  Social Connections: Moderately Isolated  . Frequency of Communication with Friends and Family: More than three times a week  . Frequency of Social Gatherings with Friends and Family: More than three times a week  . Attends Religious Services: More than 4 times per year  . Active Member of Clubs or Organizations: No  . Attends Archivist Meetings: Never  . Marital Status: Widowed    Tobacco Counseling Ready to quit: Not Answered Counseling given: Not Answered Comment: 11/11/18 still at 1/2ppd   Clinical Intake:  Pre-visit preparation completed: Yes  Pain : No/denies pain Pain Score: 0-No pain     Nutritional Risks: Unintentional weight loss (Patient currently has cancer) Diabetes: No  How often do you need to have someone help you when you read instructions, pamphlets, or other written materials from your doctor or pharmacy?: 1 - Never What is the last grade level you completed in school?: 10th grade  Diabetic?No  Interpreter Needed?: No  Information entered by :: [place] of Daily Living In your present state of health, do you have any difficulty performing the following activities: 01/02/2021 12/02/2020  Hearing? N N  Vision? N N  Difficulty concentrating or making decisions? N N  Walking or climbing stairs? Y N  Comment easily fatigued -  Dressing or bathing? N N  Doing errands, shopping? N N  Preparing Food and eating ? N -  Using the Toilet? N -  In the past six months, have you accidently leaked urine? Y -  Comment urine leakage -  Do you  have problems with loss of bowel control? N -  Managing your Medications? N -  Managing your Finances? N -  Housekeeping or managing your Housekeeping? N -  Some recent data might be hidden    Patient Care Team: Susy Frizzle, MD as  PCP - General (Family Medicine) Leonie Man, MD as PCP - Cardiology (Cardiology)  Indicate any recent Medical Services you may have received from other than [facility] providers in the past year (date may be approximate).     Assessment:   This is a routine wellness examination for Danyeal.  Hearing/Vision screen  Hearing Screening   125Hz  250Hz  500Hz  1000Hz  2000Hz  3000Hz  4000Hz  6000Hz  8000Hz   Right ear:           Left ear:           Vision Screening Comments: Patient states wears glasses, gets eye exams once per year.  Dietary issues and exercise activities discussed: Current Exercise Habits: The patient does not participate in regular exercise at present, Exercise limited by: respiratory conditions(s)  Goals Addressed            This Visit's Progress   . Exercise 3x per week (30 min per time)      . Quit smoking / using tobacco       Cutting back on cigarettes little by little       Depression Screen PHQ 2/9 Scores 01/02/2021 10/03/2020 03/09/2018 05/25/2017 04/19/2017 02/08/2017 12/03/2016  PHQ - 2 Score 2 0 6 4 2 6 6   PHQ- 9 Score 9 - 18 14 2 21 24     Fall Risk Fall Risk  01/02/2021 10/03/2020 07/26/2019 07/15/2018 04/19/2017  Falls in the past year? 0 0 0 0 No  Comment - - [facility] Telephone Survey: data to providers prior to load [employer] Telephone Survey: data to providers prior to load -  Number falls in past yr: 0 0 - - -  Injury with Fall? 0 0 - - -  Risk for fall due to : No Fall Risks - - - -  Follow up Falls evaluation completed;Falls prevention discussed Falls evaluation completed - - -    FALL RISK PREVENTION PERTAINING TO THE HOME:  Any stairs in or around the home? No  If so, are there any without handrails? No  Home free of loose throw  rugs in walkways, pet beds, electrical cords, etc? Yes  Adequate lighting in your home to reduce risk of falls? Yes   ASSISTIVE DEVICES UTILIZED TO PREVENT FALLS:  Life alert? No  Use of a cane, walker or w/c? No  Grab bars in the bathroom? No  Shower chair or bench in shower? No  Elevated toilet seat or a handicapped toilet? No     Cognitive Function:   Normal cognitive status assessed by direct observation by this Nurse Health Advisor. No abnormalities found.        Immunizations Immunization History  Administered Date(s) Administered  . Influenza, High Dose Seasonal PF 05/24/2014, 05/11/2019  . Influenza,inj,Quad PF,6+ Mos 06/20/2015  . Pneumococcal Conjugate-13 11/29/2013  . Pneumococcal Polysaccharide-23 12/23/2011  . Tdap 12/23/2011  . Zoster 01/17/2013    TDAP status: Up to date  Flu Vaccine status: Declined, Education has been provided regarding the importance of this vaccine but patient still declined. Advised may receive this vaccine at local pharmacy or Health Dept. Aware to provide a copy of the vaccination record if obtained from local pharmacy or Health Dept. Verbalized acceptance and understanding.  Pneumococcal vaccine status: Up to date  Covid-19 vaccine status: Declined, Education has been provided regarding the importance of this vaccine but patient still declined. Advised may receive this vaccine at local pharmacy or Health Dept.or vaccine clinic. Aware to provide a copy of the vaccination record if obtained  from local pharmacy or Health Dept. Verbalized acceptance and understanding.  Qualifies for Shingles Vaccine? Yes   Zostavax completed Yes   Shingrix Completed?: No.    Education has been provided regarding the importance of this vaccine. Patient has been advised to call insurance company to determine out of pocket expense if they have not yet received this vaccine. Advised may also receive vaccine at local pharmacy or Health Dept. Verbalized acceptance  and understanding.  Screening Tests Health Maintenance  Topic Date Due  . COVID-19 Vaccine (1) Never done  . COLONOSCOPY (Pts 45-77yrs Insurance coverage will need to be confirmed)  09/01/2015  . MAMMOGRAM  12/11/2019  . PNA vac Low Risk Adult (2 of 2 - PPSV23) 09/12/2021 (Originally 12/22/2016)  . INFLUENZA VACCINE  03/31/2021  . TETANUS/TDAP  12/22/2021  . DEXA SCAN  Completed  . Hepatitis C Screening  Completed  . HPV VACCINES  Aged Out    Health Maintenance  Health Maintenance Due  Topic Date Due  . COVID-19 Vaccine (1) Never done  . COLONOSCOPY (Pts 45-83yrs Insurance coverage will need to be confirmed)  09/01/2015  . MAMMOGRAM  12/11/2019    Colonoscopy Cancer Screening: Patient has declined colonscopy  Mammogram status: Ordered 12/25/2020. Pt provided with contact info and advised to call to schedule appt.   Bone Density status: Ordered 01/02/2021. Pt provided with contact info and advised to call to schedule appt.  Lung Cancer Screening: (Low Dose CT Chest recommended if Age 34-80 years, 30 pack-year currently smoking OR have quit w/in 15years.) does not qualify.   Lung Cancer Screening Referral: N/A   Additional Screening:  Hepatitis C Screening: does qualify; Completed 08/11/2018  Vision Screening: Recommended annual ophthalmology exams for early detection of glaucoma and other disorders of the eye. Is the patient up to date with their annual eye exam?  Yes  Who is the provider or what is the name of the office in which the patient attends annual eye exams? Dr. Katy Fitch  If pt is not established with a provider, would they like to be referred to a provider to establish care? No .   Dental Screening: Recommended annual dental exams for proper oral hygiene  Community Resource Referral / Chronic Care Management: CRR required this visit?  No   CCM required this visit?  No      Plan:     I have personally reviewed and noted the following in the patient's  chart:   . Medical and social history . Use of alcohol, tobacco or illicit drugs  . Current medications and supplements including opioid prescriptions.  . Functional ability and status . Nutritional status . Physical activity . Advanced directives . List of other physicians . Hospitalizations, surgeries, and ER visits in previous 12 months . Vitals . Screenings to include cognitive, depression, and falls . Referrals and appointments  In addition, I have reviewed and discussed with patient certain preventive protocols, quality metrics, and best practice recommendations. A written personalized care plan for preventive services as well as general preventive health recommendations were provided to patient.     Ofilia Neas, LPN   09/05/1094   Nurse Notes: None

## 2021-01-02 ENCOUNTER — Other Ambulatory Visit: Payer: Self-pay

## 2021-01-02 ENCOUNTER — Ambulatory Visit (INDEPENDENT_AMBULATORY_CARE_PROVIDER_SITE_OTHER): Payer: Medicare Other

## 2021-01-02 DIAGNOSIS — Z780 Asymptomatic menopausal state: Secondary | ICD-10-CM

## 2021-01-02 DIAGNOSIS — Z0001 Encounter for general adult medical examination with abnormal findings: Secondary | ICD-10-CM

## 2021-01-02 DIAGNOSIS — Z0000 Encounter for general adult medical examination without abnormal findings: Secondary | ICD-10-CM

## 2021-01-02 NOTE — Patient Instructions (Signed)
Alexa Orozco , Thank you for taking time to come for your Medicare Wellness Visit. I appreciate your ongoing commitment to your health goals. Please review the following plan we discussed and let me know if I can assist you in the future.   Screening recommendations/referrals: Colonoscopy: Currently due, please let us know when you would like for [place] to get you scheduled  Mammogram: Currently due, please keep your upcoming appointment on 02/26/2021 Bone Density: Currently due, orders placed this visit  Recommended yearly ophthalmology/optometry visit for glaucoma screening and checkup Recommended yearly dental visit for hygiene and checkup  Vaccinations: Influenza vaccine: You may await until fall 2022 to get your flu vaccine  Pneumococcal vaccine: Completed series  Tdap vaccine: Up to date, next due 12/23/2011 Shingles vaccine: Currently due for Shingrix, if you would like to receive we recommend that you do so at your local pharmacy.     Advanced directives: Please bring copies of your advanced medical directives so that we may scan them into your chart.  Conditions/risks identified: We discussed smoking cessation today, and also discussed trying to incorporate some walking into your daily routine   Next appointment: None   Preventive Care 65 Years and Older, Female Preventive care refers to lifestyle choices and visits with your health care provider that can promote health and wellness. What does preventive care include?  A yearly physical exam. This is also called an annual well check.  Dental exams once or twice a year.  Routine eye exams. Ask your health care provider how often you should have your eyes checked.  Personal lifestyle choices, including:  Daily care of your teeth and gums.  Regular physical activity.  Eating a healthy diet.  Avoiding tobacco and drug use.  Limiting alcohol use.  Practicing safe sex.  Taking low-dose aspirin every day.  Taking vitamin  and mineral supplements as recommended by your health care provider. What happens during an annual well check? The services and screenings done by your health care provider during your annual well check will depend on your age, overall health, lifestyle risk factors, and family history of disease. Counseling  Your health care provider may ask you questions about your:  Alcohol use.  Tobacco use.  Drug use.  Emotional well-being.  Home and relationship well-being.  Sexual activity.  Eating habits.  History of falls.  Memory and ability to understand (cognition).  Work and work Statistician.  Reproductive health. Screening  You may have the following tests or measurements:  Height, weight, and BMI.  Blood pressure.  Lipid and cholesterol levels. These may be checked every 5 years, or more frequently if you are over 27 years old.  Skin check.  Lung cancer screening. You may have this screening every year starting at age 3 if you have a 30-pack-year history of smoking and currently smoke or have quit within the past 15 years.  Fecal occult blood test (FOBT) of the stool. You may have this test every year starting at age 13.  Flexible sigmoidoscopy or colonoscopy. You may have a sigmoidoscopy every 5 years or a colonoscopy every 10 years starting at age 63.  Hepatitis C blood test.  Hepatitis B blood test.  Sexually transmitted disease (STD) testing.  Diabetes screening. This is done by checking your blood sugar (glucose) after you have not eaten for a while (fasting). You may have this done every 1-3 years.  Bone density scan. This is done to screen for osteoporosis. You may have this done starting  at age 22.  Mammogram. This may be done every 1-2 years. Talk to your health care provider about how often you should have regular mammograms. Talk with your health care provider about your test results, treatment options, and if necessary, the need for more  tests. Vaccines  Your health care provider may recommend certain vaccines, such as:  Influenza vaccine. This is recommended every year.  Tetanus, diphtheria, and acellular pertussis (Tdap, Td) vaccine. You may need a Td booster every 10 years.  Zoster vaccine. You may need this after age 59.  Pneumococcal 13-valent conjugate (PCV13) vaccine. One dose is recommended after age 44.  Pneumococcal polysaccharide (PPSV23) vaccine. One dose is recommended after age 45. Talk to your health care provider about which screenings and vaccines you need and how often you need them. This information is not intended to replace advice given to you by your health care provider. Make sure you discuss any questions you have with your health care provider. Document Released: 09/13/2015 Document Revised: 05/06/2016 Document Reviewed: 06/18/2015 Elsevier Interactive Patient Education  2017 [place] Prevention in the Home Falls can cause injuries. They can happen to people of all ages. There are many things you can do to make your home safe and to help prevent falls. What can I do on the outside of my home?  Regularly fix the edges of walkways and driveways and fix any cracks.  Remove anything that might make you trip as you walk through a door, such as a raised step or threshold.  Trim any bushes or trees on the path to your home.  Use bright outdoor lighting.  Clear any walking paths of anything that might make someone trip, such as rocks or tools.  Regularly check to see if handrails are loose or broken. Make sure that both sides of any steps have handrails.  Any raised decks and porches should have guardrails on the edges.  Have any leaves, snow, or ice cleared regularly.  Use sand or salt on walking paths during winter.  Clean up any spills in your garage right away. This includes oil or grease spills. What can I do in the bathroom?  Use night lights.  Install grab bars by the  toilet and in the tub and shower. Do not use towel bars as grab bars.  Use non-skid mats or decals in the tub or shower.  If you need to sit down in the shower, use a plastic, non-slip stool.  Keep the floor dry. Clean up any water that spills on the floor as soon as it happens.  Remove soap buildup in the tub or shower regularly.  Attach bath mats securely with double-sided non-slip rug tape.  Do not have throw rugs and other things on the floor that can make you trip. What can I do in the bedroom?  Use night lights.  Make sure that you have a light by your bed that is easy to reach.  Do not use any sheets or blankets that are too big for your bed. They should not hang down onto the floor.  Have a firm chair that has side arms. You can use this for support while you get dressed.  Do not have throw rugs and other things on the floor that can make you trip. What can I do in the kitchen?  Clean up any spills right away.  Avoid walking on wet floors.  Keep items that you use a lot in easy-to-reach places.  If  you need to reach something above you, use a strong step stool that has a grab bar.  Keep electrical cords out of the way.  Do not use floor polish or wax that makes floors slippery. If you must use wax, use non-skid floor wax.  Do not have throw rugs and other things on the floor that can make you trip. What can I do with my stairs?  Do not leave any items on the stairs.  Make sure that there are handrails on both sides of the stairs and use them. Fix handrails that are broken or loose. Make sure that handrails are as long as the stairways.  Check any carpeting to make sure that it is firmly attached to the stairs. Fix any carpet that is loose or worn.  Avoid having throw rugs at the top or bottom of the stairs. If you do have throw rugs, attach them to the floor with carpet tape.  Make sure that you have a light switch at the top of the stairs and the bottom of  the stairs. If you do not have them, ask someone to add them for you. What else can I do to help prevent falls?  Wear shoes that:  Do not have high heels.  Have rubber bottoms.  Are comfortable and fit you well.  Are closed at the toe. Do not wear sandals.  If you use a stepladder:  Make sure that it is fully opened. Do not climb a closed stepladder.  Make sure that both sides of the stepladder are locked into place.  Ask someone to hold it for you, if possible.  Clearly mark and make sure that you can see:  Any grab bars or handrails.  First and last steps.  Where the edge of each step is.  Use tools that help you move around (mobility aids) if they are needed. These include:  Canes.  Walkers.  Scooters.  Crutches.  Turn on the lights when you go into a dark area. Replace any light bulbs as soon as they burn out.  Set up your furniture so you have a clear path. Avoid moving your furniture around.  If any of your floors are uneven, fix them.  If there are any pets around you, be aware of where they are.  Review your medicines with your doctor. Some medicines can make you feel dizzy. This can increase your chance of falling. Ask your doctor what other things that you can do to help prevent falls. This information is not intended to replace advice given to you by your health care provider. Make sure you discuss any questions you have with your health care provider. Document Released: 06/13/2009 Document Revised: 01/23/2016 Document Reviewed: 09/21/2014 Elsevier Interactive Patient Education  2017 [employer].

## 2021-01-09 ENCOUNTER — Telehealth: Payer: Self-pay

## 2021-01-09 NOTE — Telephone Encounter (Signed)
Left patient a message to call and schedule treatments

## 2021-01-09 NOTE — Telephone Encounter (Signed)
-----   Message from Billey Co, MD sent at 01/01/2021 11:39 AM EDT ----- Regarding: BCG Please schedule induction BCG for 6 sessions starting end of May or early June, and cystoscopy with me in 3 months from now  Thanks [name], MD 01/01/2021

## 2021-01-14 NOTE — Telephone Encounter (Signed)
Spoke with patient's daughter Gerome Apley and discussed BCG instructions in detail. Previously scheduled for BCG treatments. She states patient has been having worsening urinary symptoms since this weekend which include urinary frequency, urgency and low back pain. Concern for possible UTI, she was added to PA schedule tomorrow for further evaluation.  If urine is clear discuss possible OAB medication trial prior to BCG treatment therapy? Patient's daughter is concerned she will not be able to hold her urine for 2 hours

## 2021-01-15 ENCOUNTER — Other Ambulatory Visit: Payer: Self-pay

## 2021-01-15 ENCOUNTER — Ambulatory Visit (INDEPENDENT_AMBULATORY_CARE_PROVIDER_SITE_OTHER): Payer: Medicare Other | Admitting: Physician Assistant

## 2021-01-15 ENCOUNTER — Other Ambulatory Visit: Payer: Self-pay | Admitting: *Deleted

## 2021-01-15 ENCOUNTER — Encounter: Payer: Self-pay | Admitting: Physician Assistant

## 2021-01-15 VITALS — BP 135/74 | HR 51 | Ht 62.0 in | Wt 163.0 lb

## 2021-01-15 DIAGNOSIS — R350 Frequency of micturition: Secondary | ICD-10-CM | POA: Diagnosis not present

## 2021-01-15 DIAGNOSIS — J441 Chronic obstructive pulmonary disease with (acute) exacerbation: Secondary | ICD-10-CM

## 2021-01-15 DIAGNOSIS — J302 Other seasonal allergic rhinitis: Secondary | ICD-10-CM

## 2021-01-15 LAB — BLADDER SCAN AMB NON-IMAGING

## 2021-01-15 MED ORDER — SPIRIVA RESPIMAT 2.5 MCG/ACT IN AERS
2.0000 | INHALATION_SPRAY | RESPIRATORY_TRACT | 3 refills | Status: DC
Start: 2021-01-15 — End: 2021-06-05

## 2021-01-15 MED ORDER — PANTOPRAZOLE SODIUM 40 MG PO TBEC: 40.0000 mg | DELAYED_RELEASE_TABLET | ORAL | 3 refills | Status: DC

## 2021-01-15 MED ORDER — MIRABEGRON ER 50 MG PO TB24: 50.0000 mg | ORAL_TABLET | Freq: Every day | ORAL | 0 refills | Status: DC

## 2021-01-15 MED ORDER — LEVOTHYROXINE SODIUM 88 MCG PO TABS: 88.0000 ug | ORAL_TABLET | Freq: Every day | ORAL | 3 refills | Status: DC

## 2021-01-15 MED ORDER — CEFDINIR 300 MG PO CAPS: 300.0000 mg | ORAL_CAPSULE | Freq: Two times a day (BID) | ORAL | 0 refills | Status: AC

## 2021-01-15 MED ORDER — MONTELUKAST SODIUM 10 MG PO TABS: ORAL_TABLET | ORAL | 1 refills | Status: DC

## 2021-01-15 MED ORDER — CLOPIDOGREL BISULFATE 75 MG PO TABS: 75.0000 mg | ORAL_TABLET | Freq: Every day | ORAL | 3 refills | Status: DC

## 2021-01-15 MED ORDER — PRAVASTATIN SODIUM 20 MG PO TABS: 20.0000 mg | ORAL_TABLET | ORAL | 3 refills | Status: DC

## 2021-01-15 MED ORDER — SERTRALINE HCL 100 MG PO TABS: 100.0000 mg | ORAL_TABLET | ORAL | 3 refills | Status: DC

## 2021-01-15 NOTE — Progress Notes (Signed)
01/15/2021 3:19 PM   Alexa Orozco Alexa Orozco 06-04-1946 [id number]  CC: Chief Complaint  Patient presents with  . Urinary Frequency    HPI: Alexa Orozco is a 75 y.o. 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  Today she reports her symptoms are stable and bothersome.  She is wearing absorbent underwear around-the-clock and changing these 3-4 times daily.  She was previously prescribed oxybutynin 10 mg XL and noted improvement in bladder "cramps."  She states she had persistent urinary leakage on this medication.  She denies dysuria.  In-office UA today positive for 1+ blood, nitrites, and 2+ leukocyte esterase; urine microscopy with 11-30 WBCs/HPF, 3-10 RBCs/HPF, and many bacteria. PVR 48mL.  PMH: Past Medical History:  Diagnosis Date  . Allergies   . Allergy   . Angina pectoris, unspecified ([place])   . Anxiety   . Asthma   . CAD S/P percutaneous coronary angioplasty February 2007   Class III Angina --> Cath: 85% -- PCI 3.71mm x 41mm (4.0 mm) Cypher DES to RCA; Myoview October 2014: normal stress test: LOW RISK; mild anteroapical breast attenuation  . Cataract    h/o bilat repair  . Claudication ([place]) 06/01/2013  . COPD (chronic obstructive pulmonary disease) ([place])   . Depression 11/29/2013  . Emphysema of lung ([place])   . GERD (gastroesophageal reflux disease)   . History of palpitations   . History of tobacco abuse   . Hyperlipidemia   . Hypertension    well-controlled  . Hypothyroidism   . Osteoporosis   . Raynaud disease     Surgical History: Past Surgical History:  Procedure Laterality Date  . ABDOMINAL HYSTERECTOMY     h/o partila hysterectomy  . BREAST BIOPSY  1988  . CORONARY ANGIOPLASTY WITH STENT PLACEMENT  10/14/2005   cypher DES (3.35mmx18mm) to high grade RCA lesion  . NM MYOVIEW LTD  July 2012  of October 2014   '12: dipyridamole; Normal, low risk study; 2014 - normal stress test: LOW RISK; mild anteroapical breast attenuation  . TONSILLECTOMY  1959  . TRANSTHORACIC ECHOCARDIOGRAM  05/22/2009   EF=>55%, normal LV systolic function; normal RV systolic function; mild MR/TR  . TRANSURETHRAL RESECTION OF BLADDER TUMOR N/A 11/08/2020   Procedure: TRANSURETHRAL RESECTION OF BLADDER TUMOR (TURBT);  Surgeon: Billey Co, MD;  Location: [facility] ORS;  Service: Urology;  Laterality: N/A;  . TRANSURETHRAL RESECTION OF BLADDER TUMOR N/A 12/13/2020   Procedure: TRANSURETHRAL RESECTION OF BLADDER TUMOR (TURBT);  Surgeon: Billey Co, MD;  Location: [facility] ORS;  Service: Urology;  Laterality: N/A;    Home Medications:  Allergies as of 01/15/2021      Reactions   Bystolic [nebivolol Hcl] Hives   Cephalexin Hives   Effexor [venlafaxine] Other (See Comments)   Made her face hot and red   Levaquin [levofloxacin In D5w] Other (See Comments)   Caused face to turn red and patient became extremely hot       Medication List       Accurate as of Jan 15, 2021  3:19 PM. If you have any questions, ask your nurse or doctor.        acetaminophen 500 MG tablet Commonly known as: TYLENOL Take 500-1,000 mg by mouth every 6 (six) hours as needed (pain).   albuterol (2.5 MG/3ML) 0.083% nebulizer solution Commonly known as: PROVENTIL  Take 3 mLs (2.5 mg total) by nebulization every 6 (six) hours as needed for wheezing or shortness of breath.   albuterol 108 (90 Base) MCG/ACT inhaler Commonly known as: VENTOLIN HFA Inhale 2 puffs into the lungs every 6 (six) hours as needed for wheezing or shortness of breath. Patients insurance requires CFC-free aerosol.  Please dispense only that   clonazePAM 0.5 MG tablet Commonly known as: KLONOPIN Take 0.5-1 tablets (0.25-0.5 mg total) by mouth daily as needed for anxiety. What changed: how much to take   clopidogrel 75 MG tablet Commonly known as:  PLAVIX Take 75 mg by mouth daily.   cromolyn 4 % ophthalmic solution Commonly known as: OPTICROM Place 1 drop into both eyes 4 (four) times daily.   Eszopiclone 3 MG Tabs TAKE 1 TABLET BY MOUTH IMMEDIATELY BEFORE BEDTIME   levothyroxine 88 MCG tablet Commonly known as: SYNTHROID Take 1 tablet (88 mcg total) by mouth daily. What changed: when to take this   montelukast 10 MG tablet Commonly known as: SINGULAIR TAKE 1 TABLET BY MOUTH EVERYDAY AT BEDTIME What changed:   how much to take  how to take this  when to take this  additional instructions   nitroGLYCERIN 0.4 MG SL tablet Commonly known as: NITROSTAT Place 1 tablet (0.4 mg total) under the tongue every 5 (five) minutes as needed for chest pain.   oxybutynin 10 MG 24 hr tablet Commonly known as: DITROPAN-XL Take 1 tablet (10 mg total) by mouth daily.   pantoprazole 40 MG tablet Commonly known as: PROTONIX Take 1 tablet (40 mg total) by mouth daily. What changed: when to take this   pravastatin 20 MG tablet Commonly known as: PRAVACHOL TAKE 1 TABLET BY MOUTH EVERY DAY What changed: when to take this   sertraline 100 MG tablet Commonly known as: ZOLOFT TAKE 1 TABLET BY MOUTH EVERY DAY What changed: when to take this   Spiriva Respimat 2.5 MCG/ACT Aers Generic drug: Tiotropium Bromide Monohydrate INHALE 2 PUFFS BY MOUTH INTO THE LUNGS DAILY What changed: See the new instructions.   tiZANidine 4 MG tablet Commonly known as: Zanaflex Take 1 tablet (4 mg total) by mouth every 6 (six) hours as needed for muscle spasms.       Allergies:  Allergies  Allergen Reactions  . Bystolic [Nebivolol Hcl] Hives  . Cephalexin Hives  . Effexor [Venlafaxine] Other (See Comments)    Made her face hot and red  . Levaquin [Levofloxacin In D5w] Other (See Comments)    Caused face to turn red and patient became extremely hot     Family History: Family History  Problem Relation Age of Onset  . Liver cancer Mother    . Heart attack Mother   . Heart Problems Sister   . Lupus Sister   . Liver cancer Brother   . Asthma Brother   . Breast cancer Neg Hx   . Prostate cancer Neg Hx   . Bladder Cancer Neg Hx   . Kidney cancer Neg Hx     Social History:   reports that she has been smoking cigarettes. She has a 17.50 pack-year smoking history. She has never used smokeless tobacco. She reports that she does not drink alcohol and does not use drugs.  Physical Exam: BP 135/74   Pulse (!) 51   Ht 5\' 2"  (1.575 m)   Wt 163 lb (73.9 kg)   BMI 29.81 kg/m   Constitutional:  Alert and oriented, no acute distress, nontoxic appearing HEENT:  [place], AT Cardiovascular: No clubbing, cyanosis, or edema Respiratory: Normal respiratory effort, no increased work of breathing Skin: No rashes, bruises or suspicious lesions Neurologic: Grossly intact, no focal deficits, moving all 4 extremities Psychiatric: Normal mood and affect  Laboratory Data: Results for orders placed or performed in visit on 01/15/21  CULTURE, URINE COMPREHENSIVE   Specimen: Urine   UR  Result Value Ref Range   Urine Culture, Comprehensive Preliminary report    Organism ID, Bacteria Comment    Organism ID, Bacteria Comment   Microscopic Examination   Urine  Result Value Ref Range   WBC, UA 11-30 (A) 0 - 5 /hpf   RBC 3-10 (A) 0 - 2 /hpf   Epithelial Cells (non renal) 0-10 0 - 10 /hpf   Bacteria, UA Many (A) None seen/Few  Urinalysis, Complete  Result Value Ref Range   Specific Gravity, UA 1.025 1.005 - 1.030   pH, UA 5.5 5.0 - 7.5   Color, UA Yellow Yellow   Appearance Ur Cloudy (A) Clear   Leukocytes,UA 2+ (A) Negative   Protein,UA Negative Negative/Trace   Glucose, UA Negative Negative   Ketones, UA Negative Negative   RBC, UA 1+ (A) Negative   Bilirubin, UA Negative Negative   Urobilinogen, Ur 0.2 0.2 - 1.0 mg/dL   Nitrite, UA Positive (A) Negative   Microscopic Examination See below:   Bladder Scan (Post Void Residual) in  office  Result Value Ref Range   Scan Result 77mL    Assessment & Plan:   1. Urinary frequency UA grossly infected today, though OAB wet symptoms are stable and she denies dysuria.  I suspect her symptoms are more consistent with OAB than acute cystitis, however given plans for upcoming BCG, will treat regardless.  Starting empiric cefdinir and sending urine for culture for further evaluation.  Additionally, I provided her with Myrbetriq 50 mg samples for treatment of OAB wet.  If she is unable to hold her urine despite pharmacotherapy, may consider leaving a plugged catheter indwelling following bladder installations x2 hours.  She expressed understanding. - Urinalysis, Complete - Bladder Scan (Post Void Residual) in office - CULTURE, URINE COMPREHENSIVE - cefdinir (OMNICEF) 300 MG capsule; Take 1 capsule (300 mg total) by mouth 2 (two) times daily for 5 days.  Dispense: 10 capsule; Refill: 0 - mirabegron ER (MYRBETRIQ) 50 MG TB24 tablet; Take 1 tablet (50 mg total) by mouth daily.  Dispense: 28 tablet; Refill: 0   Return if symptoms worsen or fail to improve.  Debroah Loop, PA-C  [facility] [address], [place], [place] [address] [phone]

## 2021-01-15 NOTE — Patient Instructions (Signed)
Start the antibiotic cefdinir, which I sent to your pharmacy today. You'll take it twice per day for 5 days to treat a UTI. Start Myrbetriq, which I gave you samples of today. You'll take one pill daily to help with your urinary urgency and leakage.

## 2021-01-17 LAB — URINALYSIS, COMPLETE
Bilirubin, UA: NEGATIVE
Glucose, UA: NEGATIVE
Ketones, UA: NEGATIVE
Nitrite, UA: POSITIVE — AB
Protein,UA: NEGATIVE
Specific Gravity, UA: 1.025 (ref 1.005–1.030)
Urobilinogen, Ur: 0.2 mg/dL (ref 0.2–1.0)
pH, UA: 5.5 (ref 5.0–7.5)

## 2021-01-17 LAB — MICROSCOPIC EXAMINATION

## 2021-01-23 LAB — CULTURE, URINE COMPREHENSIVE

## 2021-01-24 ENCOUNTER — Other Ambulatory Visit: Payer: Self-pay | Admitting: *Deleted

## 2021-01-24 MED ORDER — ALBUTEROL SULFATE HFA 108 (90 BASE) MCG/ACT IN AERS: 2.0000 | INHALATION_SPRAY | Freq: Four times a day (QID) | RESPIRATORY_TRACT | 2 refills | Status: DC | PRN

## 2021-01-30 ENCOUNTER — Ambulatory Visit: Payer: Medicare Other | Admitting: Physician Assistant

## 2021-01-31 ENCOUNTER — Encounter: Payer: Self-pay | Admitting: Physician Assistant

## 2021-01-31 ENCOUNTER — Ambulatory Visit (INDEPENDENT_AMBULATORY_CARE_PROVIDER_SITE_OTHER): Payer: Medicare Other | Admitting: Physician Assistant

## 2021-01-31 ENCOUNTER — Other Ambulatory Visit: Payer: Self-pay

## 2021-01-31 VITALS — BP 146/81 | HR 54 | Ht 62.0 in | Wt 163.0 lb

## 2021-01-31 DIAGNOSIS — D494 Neoplasm of unspecified behavior of bladder: Secondary | ICD-10-CM | POA: Diagnosis not present

## 2021-01-31 MED ORDER — BCG LIVE 50 MG IS SUSR
1.0000 mL | Freq: Once | INTRAVESICAL | Status: AC
  Administered 2021-01-31: 25 mg via INTRAVESICAL

## 2021-01-31 MED ORDER — OXYBUTYNIN CHLORIDE ER 10 MG PO TB24: 10.0000 mg | ORAL_TABLET | Freq: Every day | ORAL | 0 refills | Status: DC

## 2021-01-31 NOTE — Progress Notes (Signed)
BCG Bladder Instillation  BCG # 1 of 6  Due to Bladder Cancer patient is present today for a BCG treatment. Patient was cleaned and prepped in a sterile fashion with betadine. A 14FR Foley catheter was inserted, urine return was noted 24ml, urine was yellow in color.  The balloon was filled with 10cc sterile water. 10ml of reconstituted BCG was instilled into the bladder. The catheter was then plugged. Patient tolerated well, no complications were noted  Performed by: Debroah Loop, PA-C and Gordy Clement, CMA  Additional notes: Patient received split, 1/2 dose of BCG today due to BCG shortage. Patient notified and all questions answered.   Patient remained in clinic for 30 minutes following instillation today for monitoring of hypersensitivity reaction; none noted.  We reviewed post-instillation instructions including holding the urine for 2 hours with quarter turns every 15 minutes and pouring bleach into the toilet with subsequent voids for 6 hours. Written instructions also provided today. She expressed understanding.  Patient has significant urge incontinence, >daily, though improved on Myrbetriq 50mg . We elected to place a 14Fr Foley catheter today and plug it after bladder instillation. Patient was instructed to remove the catheter at home after 2 hours and the catheter was marked with a Sharpie to indicate where to cut to drain the balloon. Provided patient with a red bag for waste collection and counseled her to bring this with her next week for disposal. She expressed understanding.   Additionally, will start oxybutynin XL 10mg  daily to augment Mybetriq as patient is emptying well today.  Follow up: 1 week for BCG #2 of 6

## 2021-01-31 NOTE — Patient Instructions (Signed)
Right now through 6:00pm: Hold your urine and do your quarter turns every 15 minutes. 6:00pm today: Using scissors, cut the Foley catheter straight across where we made the black mark today. Remove the catheter and place it in the red garbage bag provided. 6:00pm-12:00am today: Every time you urinate, pour 1/2 cup of bleach into the toilet and let it sit for 15 minutes prior to flushing. If you wet any pads during this time, place them in the red garbage bag as well and plan to return the bag to [place] next week for proper disposal. 12:00am onward: Resume your normal routine.  Patient Education: (BCG) Into the Bladder (Intravesical Chemotherapy)  BCG is a vaccine which is used to prevent tuberculosis (TB).  But it's also a helpful treatment for some early bladder cancers.  When BCG goes directly into the bladder the treatment is described as intravesical.  BCG is a type of immunotherapy.  Immunotherapy stimulates the body's immune system to destroy cancer cells.  How it's given BCG treatment is given to you in an outpatient setting.  It takes a few minutes to administer and you can go home as soon as it's finished.  It might be a good idea to ask someone to bring you, particularly the fist time.  Unlike chemotherapy into the bladder, BCG treatment is never given immediately after surgery to remove bladder tumors.  There needs to be a delay usually of at least two weeks after surgery, before you can have it.  You won't be given treatment with BCG if you are unwell or have an infection in your urine.  You're usually asked to limit the amount you drink before your treatment.  This will help to increase the concentration of BCG in your bladder.  Drinking too much before your treatment may make your bladder feel uncomfortably full.  If you normally take water tablets (diuretics) take them later in the day after your treatment.  Your nurse or doctor will give you more advise about preparing for your  treatment.  You will have a small tube (catheter) placed into your bladder.  Your doctor will then put the liquid vaccine directly into your bladder through the catheter and remove the catheter.  You will need to hold your urine for two hours afterwards.  This can be difficult but it's to give the treatment time to work.  You can walk around during this time.  When the treatment is over you can go to the toilet.  After your treatment there are some precautions you'll need to take.  This is because BCG is a live vaccine and other people shouldn't be exposed to it.  For the next six hours, you'll need to avoid your urine splashing on the toilet seat and getting any urine on your hands.  It might be easer for men to sit down when they're using an ordinary toilet although using a stand up urinal should be alright.  The main this is to avoid splashing urine and spreading the vaccine.  You will also be asked to put 1/2 cup undiluted bleach into the toilet to destroy any live vaccine and leave it for 15 minutes until you flush.  Side Effects Because BCG goes directly into the bladder most of the side effects are linked with the bladder.  They usually go away within one to two days after your treatment.  The most common ones are: -needing to pass urine often -pain when you pass urine -blood in urine -flu-like symptoms (  tiredness, general aching and raised temperature)  Theses side effects should settle down within a day or two.  If they don't get better contact your doctor.  Drinking lots of fluids can help flush the drug out of your bladder and reduce some of these effects.  Taking Ibuprofen or Aleve is encouraged unless you have a condition that would make these medications unsafe to take (renal failure, diabetes, gerd)  Rare side effects can include a continuing high temperature (fever), pain in your joints and a cough.  If you have any of these symptoms, or if you feel generally unwell, contact your  doctor.  These symptoms could be a sign of a more serious infection (due to BCG) that needs to be treated immediately.  If this happens you'll be treated with the same drugs (antibiotics) that are used to treat TB.  Contraception Men should use a condom during sex for the first 48 hours after their treatment.  If you are a women who has had BCG treatment then your partner should use a condom.  Using a condom will protect your partner from any vaccine present in your semen or vaginal fluid.  We don't know how BCG may affect a developing fetus so it's not advisable to become pregnant or father a child while having it.  It is important to use effective contraception during your treatment and for six weeks afterwards.  You can discuss this with your doctor or specialist nurse.

## 2021-02-01 LAB — URINALYSIS, COMPLETE
Bilirubin, UA: NEGATIVE
Glucose, UA: NEGATIVE
Ketones, UA: NEGATIVE
Nitrite, UA: NEGATIVE
Protein,UA: NEGATIVE
Specific Gravity, UA: 1.015 (ref 1.005–1.030)
Urobilinogen, Ur: 0.2 mg/dL (ref 0.2–1.0)
pH, UA: 6 (ref 5.0–7.5)

## 2021-02-01 LAB — MICROSCOPIC EXAMINATION: Epithelial Cells (non renal): >10 /hpf — AB (ref 0–10)

## 2021-02-03 ENCOUNTER — Telehealth: Payer: Self-pay | Admitting: Family Medicine

## 2021-02-06 ENCOUNTER — Ambulatory Visit: Payer: Medicare Other | Admitting: Physician Assistant

## 2021-02-06 ENCOUNTER — Encounter: Payer: Self-pay | Admitting: Nurse Practitioner

## 2021-02-06 ENCOUNTER — Ambulatory Visit (INDEPENDENT_AMBULATORY_CARE_PROVIDER_SITE_OTHER): Payer: Medicare Other | Admitting: Nurse Practitioner

## 2021-02-06 ENCOUNTER — Other Ambulatory Visit: Payer: Self-pay

## 2021-02-06 VITALS — BP 118/70 | HR 67 | Temp 98.1°F | Ht 62.0 in | Wt 162.0 lb

## 2021-02-06 DIAGNOSIS — R42 Dizziness and giddiness: Secondary | ICD-10-CM | POA: Diagnosis not present

## 2021-02-06 DIAGNOSIS — R5383 Other fatigue: Secondary | ICD-10-CM | POA: Diagnosis not present

## 2021-02-06 DIAGNOSIS — F339 Major depressive disorder, recurrent, unspecified: Secondary | ICD-10-CM

## 2021-02-06 MED ORDER — MECLIZINE HCL 25 MG PO TABS: 12.5000 mg | ORAL_TABLET | Freq: Three times a day (TID) | ORAL | 0 refills | Status: DC | PRN

## 2021-02-06 MED ORDER — SERTRALINE HCL 100 MG PO TABS: 150.0000 mg | ORAL_TABLET | Freq: Every day | ORAL | 3 refills | Status: DC

## 2021-02-06 MED ORDER — MECLIZINE HCL 25 MG PO TABS: 25.0000 mg | ORAL_TABLET | Freq: Three times a day (TID) | ORAL | 0 refills | Status: DC | PRN

## 2021-02-06 NOTE — Progress Notes (Signed)
Subjective:    Patient ID: Alexa Orozco, female    DOB: 06/08/1946, 75 y.o.   MRN: [id number]  HPI: Alexa Orozco is a 75 y.o. female presenting for refill of meclizine.  Chief Complaint  Patient presents with   Medication Refill    Wanting to get stared bk on her meclizine. Has not had new rx since 2020. Has meds leftover. Needs new rx   Patient reports a history of vertigo.  The room spins, she gets nauseous but does not vomit.  This has been happening less than once per week.  She reports she does get off balance but does not fall.  She does have ringing in her ears, however no vision changes.  She does not have chest pain or shortness of breath with these episodes.  Meclizine has worked well in the past for the same symptoms.  FATIGUE Patient reports feeling very down and tired since her bladder cancer diagnosis.  She reports her depression has worsened slightly.  She is interested in increasing sertraline.  She is not interested in talking to her counselor today.  She denies suicidal or homicidal ideation. Duration:  days Severity:   Onset: 1 week Context when symptoms started:  recently started immunothearpy for bladder cancer Symptoms improve with rest: no  Depressive symptoms: yes Stress/anxiety: no Insomnia: yes  Snoring: no Observed apnea by bed partner: no Daytime hypersomnolence:yes Wakes feeling refreshed: no History of sleep study: no Dysnea on exertion:  no Orthopnea/PND: no Chest pain: no Chronic cough: no Lower extremity edema: no Arthralgias:no Myalgias: no Weakness: no Rash: no  Allergies  Allergen Reactions   Bystolic [Nebivolol Hcl] Hives   Cephalexin Hives   Effexor [Venlafaxine] Other (See Comments)    Made her face hot and red   Levaquin [Levofloxacin In D5w] Other (See Comments)    Caused face to turn red and patient became extremely hot     Outpatient Encounter Medications as of 02/06/2021  Medication Sig   acetaminophen (TYLENOL) 500 MG tablet  Take 500-1,000 mg by mouth every 6 (six) hours as needed (pain).   albuterol (PROVENTIL) (2.5 MG/3ML) 0.083% nebulizer solution Take 3 mLs (2.5 mg total) by nebulization every 6 (six) hours as needed for wheezing or shortness of breath.   albuterol (VENTOLIN HFA) 108 (90 Base) MCG/ACT inhaler Inhale 2 puffs into the lungs every 6 (six) hours as needed for wheezing or shortness of breath. Patients insurance requires CFC-free aerosol.  Please dispense only that   clonazePAM (KLONOPIN) 0.5 MG tablet Take 0.5-1 tablets (0.25-0.5 mg total) by mouth daily as needed for anxiety. (Patient taking differently: Take 0.5 mg by mouth daily as needed for anxiety.)   clopidogrel (PLAVIX) 75 MG tablet Take 1 tablet (75 mg total) by mouth daily.   cromolyn (OPTICROM) 4 % ophthalmic solution Place 1 drop into both eyes 4 (four) times daily.   Eszopiclone 3 MG TABS TAKE 1 TABLET BY MOUTH IMMEDIATELY BEFORE BEDTIME   levothyroxine (SYNTHROID) 88 MCG tablet Take 1 tablet (88 mcg total) by mouth daily before breakfast.   mirabegron ER (MYRBETRIQ) 50 MG TB24 tablet Take 1 tablet (50 mg total) by mouth daily.   montelukast (SINGULAIR) 10 MG tablet TAKE 1 TABLET BY MOUTH EVERYDAY AT BEDTIME   nitroGLYCERIN (NITROSTAT) 0.4 MG SL tablet Place 1 tablet (0.4 mg total) under the tongue every 5 (five) minutes as needed for chest pain.   oxybutynin (DITROPAN-XL) 10 MG 24 hr tablet Take 1 tablet (10 mg total)  by mouth daily.   pantoprazole (PROTONIX) 40 MG tablet Take 1 tablet (40 mg total) by mouth every morning.   pravastatin (PRAVACHOL) 20 MG tablet Take 1 tablet (20 mg total) by mouth every morning.   sertraline (ZOLOFT) 100 MG tablet Take 1 tablet (100 mg total) by mouth every morning.   Tiotropium Bromide Monohydrate (SPIRIVA RESPIMAT) 2.5 MCG/ACT AERS Inhale 2 puffs into the lungs every morning.   tiZANidine (ZANAFLEX) 4 MG tablet Take 1 tablet (4 mg total) by mouth every 6 (six) hours as needed for muscle spasms.   No  facility-administered encounter medications on file as of 02/06/2021.    Patient Active Problem List   Diagnosis Date Noted   Insomnia 09/19/2014   Generalized anxiety disorder 09/19/2014   COPD exacerbation ([place]) 08/20/2014   Depression 11/29/2013   Osteoporosis    Obesity (BMI 30-39.9) 05/31/2013   Stable angina ([place]) 05/31/2013   Claudication of left lower extremity ([place]) 05/31/2013   CAD S/P percutaneous coronary angioplasty    Dyslipidemia    Hirsutism 03/03/2011   Essential hypertension    Hypothyroid    Raynaud's disease    COPD (chronic obstructive pulmonary disease) (HCC)    Tobacco abuse     Past Medical History:  Diagnosis Date   Allergies    Allergy    Angina pectoris, unspecified ([place])    Anxiety    Asthma    CAD S/P percutaneous coronary angioplasty February 2007   Class III Angina --> Cath: 85% -- PCI 3.9mm x 31mm (4.0 mm) Cypher DES to RCA; Myoview October 2014: normal stress test: LOW RISK; mild anteroapical breast attenuation   Cataract    h/o bilat repair   Claudication ([place]) 06/01/2013   COPD (chronic obstructive pulmonary disease) ([place])    Depression 11/29/2013   Emphysema of lung (HCC)    GERD (gastroesophageal reflux disease)    History of palpitations    History of tobacco abuse    Hyperlipidemia    Hypertension    well-controlled   Hypothyroidism    Osteoporosis    Raynaud disease     Relevant past medical, surgical, family and social history reviewed and updated as indicated. Interim medical history since our last visit reviewed.  Review of Systems Per HPI unless specifically indicated above     Objective:    BP 118/70   Pulse 67   Temp 98.1 F (36.7 C)   Ht 5\' 2"  (1.575 m)   Wt 162 lb (73.5 kg)   SpO2 97%   BMI 29.63 kg/m   Wt Readings from Last 3 Encounters:  02/06/21 162 lb (73.5 kg)  01/31/21 163 lb (73.9 kg)  01/15/21 163 lb (73.9 kg)    Physical Exam Vitals and nursing note reviewed.  Constitutional:      General:  She is not in acute distress.    Appearance: Normal appearance. She is not toxic-appearing.  Eyes:     General: No scleral icterus.    Extraocular Movements: Extraocular movements intact.  Neck:     Vascular: No carotid bruit.  Cardiovascular:     Rate and Rhythm: Normal rate and regular rhythm.     Heart sounds: Normal heart sounds. No murmur heard. Pulmonary:     Effort: Pulmonary effort is normal. No respiratory distress.     Breath sounds: Normal breath sounds. No wheezing, rhonchi or rales.  Musculoskeletal:        General: No swelling or tenderness. Normal range of motion.  Cervical back: Normal range of motion.  Skin:    General: Skin is warm and dry.     Capillary Refill: Capillary refill takes less than 2 seconds.     Coloration: Skin is not jaundiced.     Findings: No bruising.  Neurological:     General: No focal deficit present.     Mental Status: She is alert and oriented to person, place, and time.     Motor: No weakness.     Gait: Gait normal.  Psychiatric:        Mood and Affect: Mood is depressed.        Behavior: Behavior normal.        Thought Content: Thought content normal.        Judgment: Judgment normal.      Assessment & Plan:  1. Episode of recurrent major depressive disorder, unspecified depression episode severity (HCC) Chronic.  Likely exacerbated due to recent cancer diagnosis.  Will increase sertraline to 150 mg daily.  Patient denies suicidal homicidal ideation today.  She declines politely talking with a Social worker.  She will follow-up with this does not help with her mood.  - sertraline (ZOLOFT) 100 MG tablet; Take 1.5 tablets (150 mg total) by mouth daily.  Dispense: 60 tablet; Refill: 3  2. Fatigue, unspecified type Acute.  Unclear etiology, however differentials include uncontrolled depression, sleep apnea, anemia, thyroid dysfunction.  Will treat uncontrolled depression and check blood counts with thyroid hormone today.  Follow-up  pending response to sertraline and pending lab results.  - TSH - COMPLETE METABOLIC PANEL WITH GFR - CBC with Differential  3. Vertigo Chronic.  No red flags today in history or on examination.  We will give refill of Antivert at decreased dose-12.5.  Patient to follow-up if this medicine is not helping.  - meclizine (ANTIVERT) 25 MG tablet; Take 0.5 tablets (12.5 mg total) by mouth 3 (three) times daily as needed for dizziness.  Dispense: 30 tablet; Refill: 0     Follow up plan: No follow-ups on file.

## 2021-02-07 ENCOUNTER — Ambulatory Visit (INDEPENDENT_AMBULATORY_CARE_PROVIDER_SITE_OTHER): Payer: Medicare Other | Admitting: Physician Assistant

## 2021-02-07 DIAGNOSIS — N3000 Acute cystitis without hematuria: Secondary | ICD-10-CM

## 2021-02-07 DIAGNOSIS — N390 Urinary tract infection, site not specified: Secondary | ICD-10-CM | POA: Diagnosis not present

## 2021-02-07 DIAGNOSIS — D494 Neoplasm of unspecified behavior of bladder: Secondary | ICD-10-CM

## 2021-02-07 LAB — URINALYSIS, COMPLETE
Bilirubin, UA: NEGATIVE
Glucose, UA: NEGATIVE
Ketones, UA: NEGATIVE
Nitrite, UA: POSITIVE — AB
Protein,UA: NEGATIVE
Specific Gravity, UA: 1.01 (ref 1.005–1.030)
Urobilinogen, Ur: 0.2 mg/dL (ref 0.2–1.0)
pH, UA: 5.5 (ref 5.0–7.5)

## 2021-02-07 LAB — CBC WITH DIFFERENTIAL/PLATELET
Absolute Monocytes: 666 cells/uL (ref 200–950)
Basophils Absolute: 48 cells/uL (ref 0–200)
Basophils Relative: 0.7 %
Eosinophils Absolute: 272 cells/uL (ref 15–500)
Eosinophils Relative: 4 %
HCT: 37.6 % (ref 35.0–45.0)
Hemoglobin: 12.7 g/dL (ref 11.7–15.5)
Lymphs Abs: 3074 cells/uL (ref 850–3900)
MCH: 31.4 pg (ref 27.0–33.0)
MCHC: 33.8 g/dL (ref 32.0–36.0)
MCV: 92.8 fL (ref 80.0–100.0)
MPV: 10.6 fL (ref 7.5–12.5)
Monocytes Relative: 9.8 %
Neutro Abs: 2740 cells/uL (ref 1500–7800)
Neutrophils Relative %: 40.3 %
Platelets: 343 10*3/uL (ref 140–400)
RBC: 4.05 10*6/uL (ref 3.80–5.10)
RDW: 13.6 % (ref 11.0–15.0)
Total Lymphocyte: 45.2 %
WBC: 6.8 10*3/uL (ref 3.8–10.8)

## 2021-02-07 LAB — COMPLETE METABOLIC PANEL WITH GFR
AG Ratio: 1.3 (calc) (ref 1.0–2.5)
ALT: 15 U/L (ref 6–29)
AST: 21 U/L (ref 10–35)
Albumin: 3.9 g/dL (ref 3.6–5.1)
Alkaline phosphatase (APISO): 103 U/L (ref 37–153)
BUN/Creatinine Ratio: 18 (calc) (ref 6–22)
BUN: 17 mg/dL (ref 7–25)
CO2: 25 mmol/L (ref 20–32)
Calcium: 9.5 mg/dL (ref 8.6–10.4)
Chloride: 106 mmol/L (ref 98–110)
Creat: 0.97 mg/dL — ABNORMAL HIGH (ref 0.60–0.93)
GFR, Est African American: 67 mL/min/{1.73_m2} (ref >=60)
GFR, Est Non African American: 58 mL/min/{1.73_m2} — ABNORMAL LOW (ref >=60)
Globulin: 2.9 g/dL (calc) (ref 1.9–3.7)
Glucose, Bld: 90 mg/dL (ref 65–99)
Potassium: 4.4 mmol/L (ref 3.5–5.3)
Sodium: 138 mmol/L (ref 135–146)
Total Bilirubin: 0.4 mg/dL (ref 0.2–1.2)
Total Protein: 6.8 g/dL (ref 6.1–8.1)

## 2021-02-07 LAB — MICROSCOPIC EXAMINATION

## 2021-02-07 LAB — TSH: TSH: 3.82 mIU/L (ref 0.40–4.50)

## 2021-02-07 MED ORDER — NITROFURANTOIN MONOHYD MACRO 100 MG PO CAPS: 100.0000 mg | ORAL_CAPSULE | Freq: Two times a day (BID) | ORAL | 0 refills | Status: AC

## 2021-02-07 NOTE — Progress Notes (Signed)
Patient presented to the clinic today for a scheduled BCG instillation.  Urine grossly infected on UA, culture pending.  Patient reports stable urge incontinence with low belly and low back pain, no dysuria.  Will treat for acute UTI today.  Prescription for Macrobid 100mg  BID x5 days sent to pharmacy.  Patient to return to clinic next week for next scheduled BCG treatment.  We will add an additional treatment to her scheduled series to make up for today's missed instillation.  Additionally, patient reports a robust bladder spasm upon leaving the hospital last week following her first BCG instillation. She states she soaked her pad and pants despite [place] placing a Foley and plugging it. She was able to remove her catheter at home without difficulty. Patient is already taking Myrbetriq 50mg  and oxybutynin XL 10mg . Will discuss treatment plan with Dr. Diamantina Providence.  Debroah Loop, PA-C 02/07/21 2:38 PM

## 2021-02-11 ENCOUNTER — Telehealth: Payer: Self-pay | Admitting: Family Medicine

## 2021-02-11 NOTE — Telephone Encounter (Signed)
Spoke with pt, pt is aware of with the request for antibx appt is needed, appt made for NP. Pt is aware of lab results as well.

## 2021-02-11 NOTE — Telephone Encounter (Signed)
Patient called to request medication for copd symptoms; coughing, yellow mucous.   Pharmacy confirmed as  [place], [place] - [address]  [address] [place] [address]  Phone:  [phone]  Fax:  [phone]   Patient on schedule for appt with provider on Thursday; patient asked if Rx can be called in without an appointment.  Patient also wants a call back to discuss lab results from recent tests.    Please advise patient at [phone] (son's phone).

## 2021-02-13 ENCOUNTER — Telehealth: Payer: Self-pay

## 2021-02-13 ENCOUNTER — Ambulatory Visit: Payer: Medicare Other | Admitting: Physician Assistant

## 2021-02-13 ENCOUNTER — Telehealth (INDEPENDENT_AMBULATORY_CARE_PROVIDER_SITE_OTHER): Payer: Medicare Other | Admitting: Nurse Practitioner

## 2021-02-13 ENCOUNTER — Encounter: Payer: Self-pay | Admitting: Nurse Practitioner

## 2021-02-13 DIAGNOSIS — J441 Chronic obstructive pulmonary disease with (acute) exacerbation: Secondary | ICD-10-CM

## 2021-02-13 MED ORDER — PREDNISONE 20 MG PO TABS: 40.0000 mg | ORAL_TABLET | Freq: Every day | ORAL | 0 refills | Status: AC

## 2021-02-13 MED ORDER — AMOXICILLIN-POT CLAVULANATE 875-125 MG PO TABS: 1.0000 | ORAL_TABLET | Freq: Two times a day (BID) | ORAL | 0 refills | Status: AC

## 2021-02-13 MED ORDER — GUAIFENESIN ER 600 MG PO TB12: 600.0000 mg | ORAL_TABLET | Freq: Two times a day (BID) | ORAL | 0 refills | Status: DC | PRN

## 2021-02-13 NOTE — Telephone Encounter (Signed)
Pt LMOM, she has an upper respiratory infection and is scheduled for BCG tomorrow.  She wants to know if she should r/s or come in.  Per Sam- she would like to inquire if patient is having any covid symptoms, been exposed to covid, been covid tested etc., before R/S pt's appt. LMOM for patient to please return call to discuss.

## 2021-02-13 NOTE — Assessment & Plan Note (Signed)
Acute.  Question viral etiology-recommended testing for respiratory illness including COVID, however patient declines.  We will start prednisone burst with Augmentin for COPD exacerbation.  Start Mucinex to help bring up congestion.  Encouraged using inhalers every 4 hours as needed for wheezing-if shortness of breath worsens, needs to seek urgent care.  If breathing does not improve in the next 24 hours, seek urgent care.  Follow-up if symptoms not improving early next week.

## 2021-02-13 NOTE — Telephone Encounter (Signed)
Attempted patient again, LMOM.

## 2021-02-13 NOTE — Progress Notes (Signed)
Subjective:    Patient ID: Alexa Orozco, female    DOB: [birth year], 75 y.o.   MRN: [id number]  HPI: Alexa Orozco is a 75 y.o. female presenting virtually for COPD exacerbation.  Chief Complaint  Patient presents with   Nasal Congestion    Congestions since Sunday, says this happen q yr around this time. Productive coughing and runny nose. taking no meds for the sx. Pt does not have way to come here for testing. Pt also has bladder cancer, has tx tomorrow. Didi not get vaccinated for covid   UPPER RESPIRATORY TRACT INFECTION Onset: Sunday  COVID-19 testing history: not this week COVID-19 vaccination status: not vaccinated Fever: no Cough: yes; productive Shortness of breath: yes; when moving around worse Wheezing:  yes - using inhalers around the clock Chest pain: no Chest tightness: no Chest congestion: yes Nasal congestion: no Runny nose: no Post nasal drip: no Sneezing: no Sore throat: no Swollen glands:  yes, on side of neck Sinus pressure: no Headache: yes Face pain: no Toothache: no Ear pain: no  Ear pressure: no  Eyes red/itching:no Eye drainage/crusting: no  Nausea: no  Vomiting: no Diarrhea: no  Change in appetite: yes ; decreased Loss of taste/smell: no  Rash: no Fatigue: yes Sick contacts: no Strep contacts: no  Context: stable Recurrent sinusitis: no Treatments attempted: Tylenol Relief with OTC medications: somewhat  Allergies  Allergen Reactions   Bystolic [Nebivolol Hcl] Hives   Cephalexin Hives   Effexor [Venlafaxine] Other (See Comments)    Made her face hot and red   Levaquin [Levofloxacin In D5w] Other (See Comments)    Caused face to turn red and patient became extremely hot     Outpatient Encounter Medications as of 02/13/2021  Medication Sig   acetaminophen (TYLENOL) 500 MG tablet Take 500-1,000 mg by mouth every 6 (six) hours as needed (pain).   albuterol (PROVENTIL) (2.5 MG/3ML) 0.083% nebulizer solution Take 3 mLs (2.5 mg total) by  nebulization every 6 (six) hours as needed for wheezing or shortness of breath.   albuterol (VENTOLIN HFA) 108 (90 Base) MCG/ACT inhaler Inhale 2 puffs into the lungs every 6 (six) hours as needed for wheezing or shortness of breath. Patients insurance requires CFC-free aerosol.  Please dispense only that   amoxicillin-clavulanate (AUGMENTIN) 875-125 MG tablet Take 1 tablet by mouth 2 (two) times daily for 7 days.   clonazePAM (KLONOPIN) 0.5 MG tablet Take 0.5-1 tablets (0.25-0.5 mg total) by mouth daily as needed for anxiety. (Patient taking differently: Take 0.5 mg by mouth daily as needed for anxiety.)   clopidogrel (PLAVIX) 75 MG tablet Take 1 tablet (75 mg total) by mouth daily.   cromolyn (OPTICROM) 4 % ophthalmic solution Place 1 drop into both eyes 4 (four) times daily.   Eszopiclone 3 MG TABS TAKE 1 TABLET BY MOUTH IMMEDIATELY BEFORE BEDTIME   guaiFENesin (MUCINEX) 600 MG 12 hr tablet Take 1 tablet (600 mg total) by mouth 2 (two) times daily as needed for cough or to loosen phlegm.   levothyroxine (SYNTHROID) 88 MCG tablet Take 1 tablet (88 mcg total) by mouth daily before breakfast.   meclizine (ANTIVERT) 25 MG tablet Take 0.5 tablets (12.5 mg total) by mouth 3 (three) times daily as needed for dizziness.   mirabegron ER (MYRBETRIQ) 50 MG TB24 tablet Take 1 tablet (50 mg total) by mouth daily.   montelukast (SINGULAIR) 10 MG tablet TAKE 1 TABLET BY MOUTH EVERYDAY AT BEDTIME   nitroGLYCERIN (NITROSTAT) 0.4 MG SL  tablet Place 1 tablet (0.4 mg total) under the tongue every 5 (five) minutes as needed for chest pain.   oxybutynin (DITROPAN-XL) 10 MG 24 hr tablet Take 1 tablet (10 mg total) by mouth daily.   pantoprazole (PROTONIX) 40 MG tablet Take 1 tablet (40 mg total) by mouth every morning.   pravastatin (PRAVACHOL) 20 MG tablet Take 1 tablet (20 mg total) by mouth every morning.   predniSONE (DELTASONE) 20 MG tablet Take 2 tablets (40 mg total) by mouth daily with breakfast for 5 days.    sertraline (ZOLOFT) 100 MG tablet Take 1.5 tablets (150 mg total) by mouth daily.   Tiotropium Bromide Monohydrate (SPIRIVA RESPIMAT) 2.5 MCG/ACT AERS Inhale 2 puffs into the lungs every morning.   tiZANidine (ZANAFLEX) 4 MG tablet Take 1 tablet (4 mg total) by mouth every 6 (six) hours as needed for muscle spasms.   No facility-administered encounter medications on file as of 02/13/2021.    Patient Active Problem List   Diagnosis Date Noted   Insomnia 09/19/2014   Generalized anxiety disorder 09/19/2014   COPD exacerbation ([place]) 08/20/2014   Depression 11/29/2013   Osteoporosis    Obesity (BMI 30-39.9) 05/31/2013   Stable angina ([place]) 05/31/2013   Claudication of left lower extremity ([place]) 05/31/2013   CAD S/P percutaneous coronary angioplasty    Dyslipidemia    Hirsutism 03/03/2011   Essential hypertension    Hypothyroid    Raynaud's disease    COPD (chronic obstructive pulmonary disease) (HCC)    Tobacco abuse     Past Medical History:  Diagnosis Date   Allergies    Allergy    Angina pectoris, unspecified ([place])    Anxiety    Asthma    CAD S/P percutaneous coronary angioplasty February 2007   Class III Angina --> Cath: 85% -- PCI 3.16mm x 73mm (4.0 mm) Cypher DES to RCA; Myoview October 2014: normal stress test: LOW RISK; mild anteroapical breast attenuation   Cataract    h/o bilat repair   Claudication ([place]) 06/01/2013   COPD (chronic obstructive pulmonary disease) ([place])    Depression 11/29/2013   Emphysema of lung (HCC)    GERD (gastroesophageal reflux disease)    History of palpitations    History of tobacco abuse    Hyperlipidemia    Hypertension    well-controlled   Hypothyroidism    Osteoporosis    Raynaud disease     Relevant past medical, surgical, family and social history reviewed and updated as indicated. Interim medical history since our last visit reviewed.  Review of Systems Per HPI unless specifically indicated above     Objective:    There  were no vitals taken for this visit.  Wt Readings from Last 3 Encounters:  02/06/21 162 lb (73.5 kg)  01/31/21 163 lb (73.9 kg)  01/15/21 163 lb (73.9 kg)    Physical examination unable to be performed due to lack of equipment.  Physical Exam     Assessment & Plan:   Problem List Items Addressed This Visit       Respiratory   COPD exacerbation ([place]) - Primary    Acute.  Question viral etiology-recommended testing for respiratory illness including COVID, however patient declines.  We will start prednisone burst with Augmentin for COPD exacerbation.  Start Mucinex to help bring up congestion.  Encouraged using inhalers every 4 hours as needed for wheezing-if shortness of breath worsens, needs to seek urgent care.  If breathing does not improve in the next  24 hours, seek urgent care.  Follow-up if symptoms not improving early next week.       Relevant Medications   guaiFENesin (MUCINEX) 600 MG 12 hr tablet   predniSONE (DELTASONE) 20 MG tablet   amoxicillin-clavulanate (AUGMENTIN) 875-125 MG tablet     Follow up plan: Return if symptoms worsen or fail to improve.  This visit was completed via telephone due to the restrictions of the COVID-19 pandemic. All issues as above were discussed and addressed but no physical exam was performed. If it was felt that the patient should be evaluated in the office, they were directed there. The patient verbally consented to this visit. Patient was unable to complete an audio/visual visit due to Technical difficulties.  I was able to connect with the patient via visual connection-care agility, however the patient was unable to hear me and I was unable to hear the patient.  The visit was then transitioned to a phone visit. Location of the patient: home Location of the provider: work Those involved with this call:  Provider: Noemi Chapel, DNP, FNP-C CMA: Annabelle Harman, CMA Front Desk/Registration: Vevelyn Pat  Time spent on call:  8  minutes on the phone discussing health concerns. 13 minutes total spent in review of patient's record and preparation of their chart. I verified patient identity using two factors (patient name and date of birth). Patient consents verbally to being seen via telemedicine visit today.

## 2021-02-13 NOTE — Telephone Encounter (Signed)
As long as she is afebrile and has not had a positive COVID test, okay to proceed with scheduled BCG tomorrow.  Additionally, I spoke with Dr. Diamantina Providence regarding her large volume urinary incontinence.  I would like to increase her dose of oxybutynin XL from 10 to 15 mg daily.  She should continue Myrbetriq 50 mg daily in addition to this.  Additionally, lets plan to instill a smaller volume of BCG solution, 30 mL rather than 50 mL (still containing one half, split dose of BCG).  Lastly, given that she had a robust bladder spasm around her plugged catheter at her first treatment, no need to leave a catheter in following instillation moving forward.

## 2021-02-14 ENCOUNTER — Ambulatory Visit: Payer: Medicare Other

## 2021-02-14 NOTE — Telephone Encounter (Signed)
Contacted patient with Son's number, her number is not working at this time. Patient states she is currently being treated for an upper respiratory infection hx of COPD w/ prednisone and amoxicillin starting yesterday. NO fever or chills. Patient's urine culture is still in prelim but is growing 100,000 colonies of Gram neg rods, treatment was not started since patient was asymptomatic. Per Dr. Bernardo Heater patient's treatment for BCG should be deferred again this week due to culture results and no treatment as well as patient starting prednisone, this can possibly make her immunocompromised.  Patient was contacted and it was explained in detail and appt was cancelled and will add on to the end of her schedule.

## 2021-02-17 ENCOUNTER — Other Ambulatory Visit: Payer: Medicare Other

## 2021-02-17 ENCOUNTER — Telehealth: Payer: Self-pay

## 2021-02-17 LAB — CULTURE, URINE COMPREHENSIVE

## 2021-02-17 NOTE — Telephone Encounter (Signed)
-----   Message from Debroah Loop, [place] sent at 02/17/2021  4:13 PM EDT ----- Please counsel her to start the [place] I prescribed her 10 days ago. The amoxicillin she is taking for her lungs will not clear the bacteria in her urine. It is okay for her to take both of these antibiotics at the same time.  Additionally, please increase her oxybutynin to 15mg  XL daily.  FYI her phone is not in order, please call her son to communicate the above. ----- Message ----- From: Interface, [facility] Lab Results In Sent: 02/07/2021   4:37 PM EDT To: Debroah Loop, PA-C

## 2021-02-17 NOTE — Telephone Encounter (Signed)
Attempted to reach patient on sons line, no answer, LMOM for patient to return call ASAP. See note below.

## 2021-02-18 ENCOUNTER — Other Ambulatory Visit: Payer: Self-pay | Admitting: *Deleted

## 2021-02-18 MED ORDER — NITROFURANTOIN MONOHYD MACRO 100 MG PO CAPS: 100.0000 mg | ORAL_CAPSULE | Freq: Two times a day (BID) | ORAL | 0 refills | Status: AC

## 2021-02-18 MED ORDER — OXYBUTYNIN CHLORIDE ER 15 MG PO TB24: 15.0000 mg | ORAL_TABLET | Freq: Every day | ORAL | 0 refills | Status: DC

## 2021-02-18 NOTE — Telephone Encounter (Signed)
Notified patient as instructed, patient pleased. Discussed follow-up appointments, patient agrees . Sent in medication

## 2021-02-20 ENCOUNTER — Ambulatory Visit: Payer: Medicare Other | Admitting: Physician Assistant

## 2021-02-21 ENCOUNTER — Other Ambulatory Visit: Payer: Self-pay

## 2021-02-21 ENCOUNTER — Ambulatory Visit (INDEPENDENT_AMBULATORY_CARE_PROVIDER_SITE_OTHER): Payer: Medicare Other | Admitting: Physician Assistant

## 2021-02-21 DIAGNOSIS — D494 Neoplasm of unspecified behavior of bladder: Secondary | ICD-10-CM

## 2021-02-21 DIAGNOSIS — N3281 Overactive bladder: Secondary | ICD-10-CM

## 2021-02-21 LAB — URINALYSIS, COMPLETE
Bilirubin, UA: NEGATIVE
Glucose, UA: NEGATIVE
Ketones, UA: NEGATIVE
Leukocytes,UA: NEGATIVE
Nitrite, UA: NEGATIVE
Protein,UA: NEGATIVE
RBC, UA: NEGATIVE
Specific Gravity, UA: 1.02 (ref 1.005–1.030)
Urobilinogen, Ur: 0.2 mg/dL (ref 0.2–1.0)
pH, UA: 6 (ref 5.0–7.5)

## 2021-02-21 LAB — MICROSCOPIC EXAMINATION
Bacteria, UA: NONE SEEN
Epithelial Cells (non renal): NONE SEEN /hpf (ref 0–10)
RBC, Urine: NONE SEEN /hpf (ref 0–2)
WBC, UA: NONE SEEN /hpf (ref 0–5)

## 2021-02-21 MED ORDER — BCG LIVE 50 MG IS SUSR
1.6200 mL | Freq: Once | INTRAVESICAL | Status: AC
  Administered 2021-02-21: 40.5 mg via INTRAVESICAL

## 2021-02-21 MED ORDER — GEMTESA 75 MG PO TABS: 75.0000 mg | ORAL_TABLET | Freq: Every day | ORAL | 0 refills | Status: DC

## 2021-02-21 NOTE — Progress Notes (Signed)
BCG Bladder Instillation  BCG # 2 of 6  Due to Bladder Cancer patient is present today for a BCG treatment. Patient was cleaned and prepped in a sterile fashion with betadine. A 14FR catheter was inserted, urine return was noted 74ml, urine was yellow in color.  43ml of reconstituted BCG was instilled into the bladder. The catheter was then removed. Patient tolerated well, no complications were noted  Performed by: Debroah Loop, PA-C and Bradly Bienenstock, CMA  Additional notes: Patient ran out of Myrbetriq, still having urge incontinence. Counseled her to continue oxybutynin XL 15mg  daily and start Gemtesa 75mg  daily, samples provided today. Patient received a split, 1/2 dose of BCG today due to BCG shortage. Patient notified and all questions answered.  Follow up: 1 week for BCG #3 of 6

## 2021-02-26 ENCOUNTER — Ambulatory Visit: Payer: Medicare Other

## 2021-02-27 ENCOUNTER — Ambulatory Visit: Payer: Medicare Other | Admitting: Physician Assistant

## 2021-02-27 NOTE — Progress Notes (Addendum)
BCG Bladder Instillation  BCG # 3 of 6  Due to Bladder Cancer patient is present today for a BCG treatment. Patient was cleaned and prepped in a sterile fashion with betadine. A 14 FR catheter was inserted, urine return was noted 50 ml, urine was yellow in color.  40ml of reconstituted 1/2 dose BCG was instilled into the bladder. The catheter was then removed. Patient tolerated well, no complications were noted  Performed by: Zara Council, PA-C  Follow up/ Additional notes: One week for # 4 of 6 BCG

## 2021-02-28 ENCOUNTER — Encounter: Payer: Self-pay | Admitting: Urology

## 2021-02-28 ENCOUNTER — Ambulatory Visit (INDEPENDENT_AMBULATORY_CARE_PROVIDER_SITE_OTHER): Payer: Medicare Other | Admitting: Urology

## 2021-02-28 ENCOUNTER — Other Ambulatory Visit: Payer: Self-pay

## 2021-02-28 VITALS — BP 138/74 | HR 55

## 2021-02-28 DIAGNOSIS — C679 Malignant neoplasm of bladder, unspecified: Secondary | ICD-10-CM | POA: Diagnosis not present

## 2021-02-28 LAB — URINALYSIS, COMPLETE
Bilirubin, UA: NEGATIVE
Glucose, UA: NEGATIVE
Ketones, UA: NEGATIVE
Leukocytes,UA: NEGATIVE
Nitrite, UA: NEGATIVE
Protein,UA: NEGATIVE
Specific Gravity, UA: 1.015 (ref 1.005–1.030)
Urobilinogen, Ur: 0.2 mg/dL (ref 0.2–1.0)
pH, UA: 7 (ref 5.0–7.5)

## 2021-02-28 LAB — MICROSCOPIC EXAMINATION
Bacteria, UA: NONE SEEN
RBC, Urine: NONE SEEN /hpf (ref 0–2)

## 2021-02-28 MED ORDER — BCG LIVE 50 MG IS SUSR
3.2400 mL | Freq: Once | INTRAVESICAL | Status: AC
  Administered 2021-02-28: 40.5 mg via INTRAVESICAL

## 2021-02-28 NOTE — Addendum Note (Signed)
Addended by: Verlene Mayer A on: 02/28/2021 12:45 PM   Modules accepted: Orders

## 2021-03-06 ENCOUNTER — Ambulatory Visit: Payer: Medicare Other | Admitting: Physician Assistant

## 2021-03-07 ENCOUNTER — Ambulatory Visit (INDEPENDENT_AMBULATORY_CARE_PROVIDER_SITE_OTHER): Payer: Medicare Other | Admitting: Physician Assistant

## 2021-03-07 ENCOUNTER — Other Ambulatory Visit: Payer: Self-pay

## 2021-03-07 DIAGNOSIS — N3281 Overactive bladder: Secondary | ICD-10-CM | POA: Diagnosis not present

## 2021-03-07 DIAGNOSIS — C679 Malignant neoplasm of bladder, unspecified: Secondary | ICD-10-CM | POA: Diagnosis not present

## 2021-03-07 DIAGNOSIS — R350 Frequency of micturition: Secondary | ICD-10-CM | POA: Diagnosis not present

## 2021-03-07 DIAGNOSIS — D494 Neoplasm of unspecified behavior of bladder: Secondary | ICD-10-CM | POA: Diagnosis not present

## 2021-03-07 MED ORDER — DOXYCYCLINE HYCLATE 100 MG PO CAPS: 100.0000 mg | ORAL_CAPSULE | Freq: Two times a day (BID) | ORAL | 0 refills | Status: AC

## 2021-03-07 NOTE — Patient Instructions (Signed)
Please start your antibiotics today. I'll call you if we need to change the antibiotic based on your culture results.  Please continue oxybutynin and Gemtesa. To help with the dry mouth, you may try any of the Biotene brand oral moisturizers. We will be able to stop the oxybutynin, which is causing the dry mouth, once you complete your BCG treatments.

## 2021-03-07 NOTE — Progress Notes (Signed)
Patient presented to the clinic today for a scheduled BCG instillation.  Urine grossly infected on UA, culture pending.  Patient reports 3 days of dysuria.  Will treat for acute UTI today.  Prescription for doxycycline 100 mg twice daily x7 days sent to pharmacy.  Patient to return to clinic next week for next scheduled BCG treatment.  We will add an additional treatment to her scheduled series to make up for today's missed instillation.  Additionally, patient reports dry mouth on oxybutynin.  I explained that we will have to tolerate the side effect until she completes her course of BCG given her severe urge incontinence, which she states is improving on combination Gemtesa and oxybutynin XL 15 mg daily.  Counseled her to start Biotene oral moisturizers for symptom control in the interim.  Debroah Loop, PA-C 03/07/21 3:04 PM

## 2021-03-08 LAB — MICROSCOPIC EXAMINATION: WBC, UA: >30 /hpf — AB (ref 0–5)

## 2021-03-08 LAB — URINALYSIS, COMPLETE
Bilirubin, UA: NEGATIVE
Glucose, UA: NEGATIVE
Ketones, UA: NEGATIVE
Nitrite, UA: POSITIVE — AB
Protein,UA: NEGATIVE
Specific Gravity, UA: 1.02 (ref 1.005–1.030)
Urobilinogen, Ur: 0.2 mg/dL (ref 0.2–1.0)
pH, UA: 6 (ref 5.0–7.5)

## 2021-03-10 ENCOUNTER — Telehealth: Payer: Self-pay

## 2021-03-10 DIAGNOSIS — N3281 Overactive bladder: Secondary | ICD-10-CM

## 2021-03-10 LAB — CULTURE, URINE COMPREHENSIVE

## 2021-03-10 NOTE — Telephone Encounter (Signed)
-----   Message from Debroah Loop, [place] sent at 03/10/2021  8:58 AM EDT ----- She should be feeling better on doxycycline per culture results; please confirm this. Additionally, I'd like to start her on daily suppressive Macrobid for UTI prevention for the next 3 weeks until she completes her BCG induction series. You may have more luck reaching her via her son's number, listed in her chart. ----- Message ----- From: Interface, [facility] Lab Results In Sent: 03/08/2021   5:41 AM EDT To: Debroah Loop, PA-C

## 2021-03-10 NOTE — Telephone Encounter (Signed)
Macrobid 100mg  daily x3 weeks to start the day after she completes doxycycline. Let's keep her on Gemtesa samples to ensure continuity until she completes BCG induction series, then ok to send in rx.

## 2021-03-10 NOTE — Telephone Encounter (Signed)
Called pt informed her of of the information below. Pt voiced understanding. States that she is feeling much better. She also amendable to taking suppressive antibiotic and requests RX of gemtesa. Please advise on macrobid dosage as well as ok to send in RX for [place].

## 2021-03-11 ENCOUNTER — Other Ambulatory Visit: Payer: Self-pay | Admitting: *Deleted

## 2021-03-11 MED ORDER — NITROFURANTOIN MONOHYD MACRO 100 MG PO CAPS: 100.0000 mg | ORAL_CAPSULE | Freq: Every day | ORAL | 0 refills | Status: DC

## 2021-03-11 NOTE — Telephone Encounter (Signed)
Notified patient as instructed, patient pleased. Discussed follow-up appointments, patient agrees  

## 2021-03-14 ENCOUNTER — Ambulatory Visit (INDEPENDENT_AMBULATORY_CARE_PROVIDER_SITE_OTHER): Payer: Medicare Other | Admitting: Physician Assistant

## 2021-03-14 ENCOUNTER — Other Ambulatory Visit: Payer: Self-pay

## 2021-03-14 DIAGNOSIS — C679 Malignant neoplasm of bladder, unspecified: Secondary | ICD-10-CM | POA: Diagnosis not present

## 2021-03-14 LAB — URINALYSIS, COMPLETE
Bilirubin, UA: NEGATIVE
Glucose, UA: NEGATIVE
Ketones, UA: NEGATIVE
Leukocytes,UA: NEGATIVE
Nitrite, UA: NEGATIVE
Protein,UA: NEGATIVE
Specific Gravity, UA: 1.015 (ref 1.005–1.030)
Urobilinogen, Ur: 0.2 mg/dL (ref 0.2–1.0)
pH, UA: 5.5 (ref 5.0–7.5)

## 2021-03-14 LAB — MICROSCOPIC EXAMINATION
Bacteria, UA: NONE SEEN
Epithelial Cells (non renal): >10 /hpf — AB (ref 0–10)

## 2021-03-14 MED ORDER — BCG LIVE 50 MG IS SUSR
1.6200 mL | Freq: Once | INTRAVESICAL | Status: AC
  Administered 2021-03-14: 40.5 mg via INTRAVESICAL

## 2021-03-14 NOTE — Progress Notes (Signed)
BCG Bladder Instillation  BCG # 4 of 6  Due to Bladder Cancer patient is present today for a BCG treatment. Patient was cleaned and prepped in a sterile fashion with betadine. A 14FR catheter was inserted, urine return was noted 14ml, urine was yellow in color.  45ml of reconstituted BCG was instilled into the bladder. The catheter was then removed. Patient tolerated well, no complications were noted  Performed by: Debroah Loop, PA-C and Bradly Bienenstock, CMA  Additional notes: Patient received a split, 1/2 dose of BCG today due to BCG shortage. Patient notified and all questions answered.   Follow up/ Additional notes: 1 week for BCG #5 of 6

## 2021-03-21 ENCOUNTER — Other Ambulatory Visit: Payer: Self-pay

## 2021-03-21 ENCOUNTER — Ambulatory Visit (INDEPENDENT_AMBULATORY_CARE_PROVIDER_SITE_OTHER): Payer: Medicare Other | Admitting: Physician Assistant

## 2021-03-21 DIAGNOSIS — D494 Neoplasm of unspecified behavior of bladder: Secondary | ICD-10-CM | POA: Diagnosis not present

## 2021-03-21 MED ORDER — BCG LIVE 50 MG IS SUSR
1.6200 mL | Freq: Once | INTRAVESICAL | Status: AC
  Administered 2021-03-21: 40.5 mg via INTRAVESICAL

## 2021-03-21 NOTE — Progress Notes (Signed)
BCG Bladder Instillation  BCG # 5 of 6  Due to Bladder Cancer patient is present today for a BCG treatment. Patient was cleaned and prepped in a sterile fashion with betadine. A 14FR catheter was inserted, urine return was noted 179m, urine was yellow in color.  375mof reconstituted BCG was instilled into the bladder. The catheter was then removed. Patient tolerated well, no complications were noted  Performed by: SaDebroah LoopPA-C and CrBradly BienenstockCMA  Additional notes: Patient received a split, 1/2 dose of BCG today due to BCG shortage. Patient notified and all questions answered.   Follow up/ Additional notes: 1 week for BCG #6 of 6

## 2021-03-24 LAB — URINALYSIS, COMPLETE
Bilirubin, UA: NEGATIVE
Glucose, UA: NEGATIVE
Ketones, UA: NEGATIVE
Leukocytes,UA: NEGATIVE
Nitrite, UA: NEGATIVE
Protein,UA: NEGATIVE
RBC, UA: NEGATIVE
Specific Gravity, UA: 1.02 (ref 1.005–1.030)
Urobilinogen, Ur: 0.2 mg/dL (ref 0.2–1.0)
pH, UA: 6 (ref 5.0–7.5)

## 2021-03-24 LAB — MICROSCOPIC EXAMINATION: Bacteria, UA: NONE SEEN

## 2021-03-28 ENCOUNTER — Other Ambulatory Visit: Payer: Self-pay

## 2021-03-28 ENCOUNTER — Ambulatory Visit (INDEPENDENT_AMBULATORY_CARE_PROVIDER_SITE_OTHER): Payer: Medicare Other | Admitting: Physician Assistant

## 2021-03-28 DIAGNOSIS — D494 Neoplasm of unspecified behavior of bladder: Secondary | ICD-10-CM

## 2021-03-28 MED ORDER — BCG LIVE 50 MG IS SUSR
3.2400 mL | Freq: Once | INTRAVESICAL | Status: AC
Start: 2021-03-28 — End: 2021-03-28
  Administered 2021-03-28: 81 mg via INTRAVESICAL

## 2021-03-28 NOTE — Progress Notes (Signed)
BCG Bladder Instillation  BCG # 6 of 6  Due to Bladder Cancer patient is present today for a BCG treatment. Patient was cleaned and prepped in a sterile fashion with betadine. A 14FR catheter was inserted, urine return was noted 40m, urine was yellow in color.  322mof reconstituted BCG was instilled into the bladder. The catheter was then removed. Patient tolerated well, no complications were noted  Performed by: SaDebroah LoopPA-C and CrBradly BienenstockCMA  Additional notes: Patient received a split, 1/2 dose of BCG today due to BCG shortage. Patient notified and all questions answered.   Stopping oxybutynin 2/2 dry mouth and suppressive Macrobid since she finished induction course.  Follow up/ Additional notes: 3 months for cysto with Dr. SnDiamantina Providence

## 2021-03-29 LAB — URINALYSIS, COMPLETE
Bilirubin, UA: NEGATIVE
Glucose, UA: NEGATIVE
Ketones, UA: NEGATIVE
Leukocytes,UA: NEGATIVE
Nitrite, UA: NEGATIVE
Protein,UA: NEGATIVE
RBC, UA: NEGATIVE
Specific Gravity, UA: 1.02 (ref 1.005–1.030)
Urobilinogen, Ur: 0.2 mg/dL (ref 0.2–1.0)
pH, UA: 5.5 (ref 5.0–7.5)

## 2021-03-29 LAB — MICROSCOPIC EXAMINATION

## 2021-04-16 ENCOUNTER — Ambulatory Visit: Payer: Medicare Other

## 2021-04-17 ENCOUNTER — Other Ambulatory Visit: Payer: Medicare Other | Admitting: Urology

## 2021-04-21 ENCOUNTER — Telehealth: Payer: Medicare Other

## 2021-04-21 DIAGNOSIS — N3281 Overactive bladder: Secondary | ICD-10-CM

## 2021-04-21 MED ORDER — GEMTESA 75 MG PO TABS: 75.0000 mg | ORAL_TABLET | Freq: Every day | ORAL | 11 refills | Status: DC

## 2021-04-21 NOTE — Telephone Encounter (Signed)
Incoming call on triage line from pt who states she needs her bladder medication sent to the pharmacy. RX for [employer] sent to pharmacy.

## 2021-04-24 ENCOUNTER — Other Ambulatory Visit: Payer: Self-pay | Admitting: Family Medicine

## 2021-04-24 NOTE — Telephone Encounter (Signed)
Incoming denial of Gemtesa from [employer]. The patient must try and fail the preferred drugs including darifenacin, solifenacin, tolterodine, trospium, and Toviaz.

## 2021-04-24 NOTE — Telephone Encounter (Signed)
Ok to refill??  Last office visit 02/13/2021.  Last refill 12/27/2020, #3 refills.

## 2021-04-25 MED ORDER — TROSPIUM CHLORIDE ER 60 MG PO CP24: 60.0000 mg | ORAL_CAPSULE | Freq: Every day | ORAL | 11 refills | Status: DC

## 2021-04-25 NOTE — Addendum Note (Signed)
Addended by: Donalee Citrin on: 04/25/2021 03:41 PM   Modules accepted: Orders

## 2021-05-09 ENCOUNTER — Ambulatory Visit (INDEPENDENT_AMBULATORY_CARE_PROVIDER_SITE_OTHER): Payer: Medicare Other | Admitting: Family Medicine

## 2021-05-09 ENCOUNTER — Other Ambulatory Visit: Payer: Self-pay

## 2021-05-09 VITALS — BP 122/74 | HR 64 | Temp 99.2°F | Resp 14 | Ht 62.0 in | Wt 160.0 lb

## 2021-05-09 DIAGNOSIS — J441 Chronic obstructive pulmonary disease with (acute) exacerbation: Secondary | ICD-10-CM | POA: Diagnosis not present

## 2021-05-09 MED ORDER — AZITHROMYCIN 250 MG PO TABS: ORAL_TABLET | ORAL | 0 refills | Status: DC

## 2021-05-09 MED ORDER — PREDNISONE 20 MG PO TABS: ORAL_TABLET | ORAL | 0 refills | Status: DC

## 2021-05-09 NOTE — Progress Notes (Signed)
Subjective:    Patient ID: Alexa Orozco, female    DOB: April 12, 1946, 75 y.o.   MRN: [id number]  HPI Patient is a very pleasant 75 year old Caucasian female who presents with a 5-day history of subjective fevers, cough productive of clear sputum, central chest congestion, shortness of breath, and fatigue.  She feels weak.  She feels tired.  Today on examination she has crackles heard best anteriorly on the left side as well as some posterior right basilar Rales and rhonchi.  She also has expiratory wheezing and diminished airflow in all 4 lung fields.  She never had her COVID vaccination.  She has not checked for COVID.  She denies any rhinorrhea or sore throat.  She denies any nausea or vomiting or dysuria.  She does have a history of smoking and also bladder cancer Past Medical History:  Diagnosis Date   Allergies    Allergy    Angina pectoris, unspecified ([place])    Anxiety    Asthma    CAD S/P percutaneous coronary angioplasty February 2007   Class III Angina --> Cath: 85% -- PCI 3.27m x 160m(4.0 mm) Cypher DES to RCA; Myoview October 2014: normal stress test: LOW RISK; mild anteroapical breast attenuation   Cataract    h/o bilat repair   [place]   COPD [place]chronic obstructive pulmonary disease) ([facility]   Depression 11/29/2013   Emphysema of lung ([facility])    GERD (gastroesophageal reflux disease)    History of palpitations    History of tobacco abuse    Hyperlipidemia    Hypertension    well-controlled   Hypothyroidism    Osteoporosis    Raynaud disease    Past Surgical History:  Procedure Laterality Date   ABDOMINAL HYSTERECTOMY     h/o partila hysterectomy   BREAST BIOPSY  1988   CORONARY ANGIOPLASTY WITH STENT PLACEMENT  10/14/2005   cypher DES (3.33m37m8mm) to high grade RCA lesion   NM MYOVIEW LTD  July 2012 of October 2014   '12: dipyridamole; Normal, low risk study; 2014 - normal stress test: LOW RISK; mild anteroapical breast attenuation   TONSILLECTOMY  1959    TRANSTHORACIC ECHOCARDIOGRAM  05/22/2009   EF=>55%, normal LV systolic function; normal RV systolic function; mild MR/TR   TRANSURETHRAL RESECTION OF BLADDER TUMOR N/A 11/08/2020   Procedure: TRANSURETHRAL RESECTION OF BLADDER TUMOR (TURBT);  Surgeon: SniBilley CoD;  Location: [facility] ORS;  Service: Urology;  Laterality: N/A;   TRANSURETHRAL RESECTION OF BLADDER TUMOR N/A 12/13/2020   Procedure: TRANSURETHRAL RESECTION OF BLADDER TUMOR (TURBT);  Surgeon: SniBilley CoD;  Location: [facility] ORS;  Service: Urology;  Laterality: N/A;   Current Outpatient Medications on File Prior to Visit  Medication Sig Dispense Refill   acetaminophen (TYLENOL) 500 MG tablet Take 500-1,000 mg by mouth every 6 (six) hours as needed (pain).     albuterol (PROVENTIL) (2.5 MG/3ML) 0.083% nebulizer solution Take 3 mLs (2.5 mg total) by nebulization every 6 (six) hours as needed for wheezing or shortness of breath. 150 mL 1   albuterol (VENTOLIN HFA) 108 (90 Base) MCG/ACT inhaler Inhale 2 puffs into the lungs every 6 (six) hours as needed for wheezing or shortness of breath. Patients insurance requires CFC-free aerosol.  Please dispense only that 8 g 2   clonazePAM (KLONOPIN) 0.5 MG tablet Take 0.5-1 tablets (0.25-0.5 mg total) by mouth daily as needed for anxiety. (Patient taking differently: Take 0.5 mg by mouth daily as needed for anxiety.) 60  tablet 1   clopidogrel (PLAVIX) 75 MG tablet Take 1 tablet (75 mg total) by mouth daily. 30 tablet 3   cromolyn (OPTICROM) 4 % ophthalmic solution Place 1 drop into both eyes 4 (four) times daily.     Eszopiclone 3 MG TABS TAKE 1 TABLET BY MOUTH IMMEDIATELY BEFORE BEDTIME 30 tablet 0   levothyroxine (SYNTHROID) 88 MCG tablet Take 1 tablet (88 mcg total) by mouth daily before breakfast. 30 tablet 3   meclizine (ANTIVERT) 25 MG tablet Take 0.5 tablets (12.5 mg total) by mouth 3 (three) times daily as needed for dizziness. 30 tablet 0   montelukast (SINGULAIR) 10 MG tablet TAKE  1 TABLET BY MOUTH EVERYDAY AT BEDTIME 90 tablet 1   nitroGLYCERIN (NITROSTAT) 0.4 MG SL tablet Place 1 tablet (0.4 mg total) under the tongue every 5 (five) minutes as needed for chest pain. 25 tablet 6   pantoprazole (PROTONIX) 40 MG tablet Take 1 tablet (40 mg total) by mouth every morning. 30 tablet 3   pravastatin (PRAVACHOL) 20 MG tablet Take 1 tablet (20 mg total) by mouth every morning. 30 tablet 3   sertraline (ZOLOFT) 100 MG tablet Take 1.5 tablets (150 mg total) by mouth daily. 60 tablet 3   Tiotropium Bromide Monohydrate (SPIRIVA RESPIMAT) 2.5 MCG/ACT AERS Inhale 2 puffs into the lungs every morning. 4 g 3   tiZANidine (ZANAFLEX) 4 MG tablet Take 1 tablet (4 mg total) by mouth every 6 (six) hours as needed for muscle spasms. (Patient not taking: Reported on 05/09/2021) 30 tablet 2   Trospium Chloride 60 MG CP24 Take 1 capsule (60 mg total) by mouth daily. (Patient not taking: Reported on 05/09/2021) 30 capsule 11   No current facility-administered medications on file prior to visit.   Allergies  Allergen Reactions   Bystolic [Nebivolol Hcl] Hives   Cephalexin Hives   Effexor [Venlafaxine] Other (See Comments)    Made her face hot and red   Levaquin [Levofloxacin In D5w] Other (See Comments)    Caused face to turn red and patient became extremely hot    Social History   Socioeconomic History   Marital status: Widowed    Spouse name: Not on file   Number of children: 3   Years of education: 10th    Highest education level: Not on file  Occupational History   Occupation: Retired  Tobacco Use   Smoking status: Every Day    Packs/day: 0.50    Years: 35.00    Pack years: 17.50    Types: Cigarettes   Smokeless tobacco: Never   Tobacco comments:    11/11/18 still at 1/2ppd  Vaping Use   Vaping Use: Never used  Substance and Sexual Activity   Alcohol use: No   Drug use: No   Sexual activity: Not on file  Other Topics Concern   Not on file  Social History Narrative   She  is a widowed mother of 76, grandmother for, great-grandmother 3.  She still smokes about half pack a day.  She doesn't have about 35 years.  She does not drink.   Prior to her symptoms coming on, she used to do all kind of walking around and working in the garden and other activities with her close friend.   Social Determinants of Health   Financial Resource Strain: Low Risk    Difficulty of Paying Living Expenses: Not hard at all  Food Insecurity: No Food Insecurity   Worried About Charity fundraiser in  the Last Year: Never true   Ran Out of Food in the Last Year: Never true  Transportation Needs: No Transportation Needs   Lack of Transportation (Medical): No   Lack of Transportation (Non-Medical): No  Physical Activity: Inactive   Days of Exercise per Week: 0 days   Minutes of Exercise per Session: 0 min  Stress: No Stress Concern Present   Feeling of Stress : Not at all  Social Connections: Moderately Isolated   Frequency of Communication with Friends and Family: More than three times a week   Frequency of Social Gatherings with Friends and Family: More than three times a week   Attends Religious Services: More than 4 times per year   Active Member of [employer] or Organizations: No   Attends Archivist Meetings: Never   Marital Status: Widowed  Human resources officer Violence: Not At Risk   Fear of Current or Ex-Partner: No   Emotionally Abused: No   Physically Abused: No   Sexually Abused: No      Review of Systems  All other systems reviewed and are negative.     Objective:   Physical Exam Vitals reviewed.  Constitutional:      General: She is not in acute distress.    Appearance: Normal appearance. She is normal weight. She is not ill-appearing or toxic-appearing.  HENT:     Head: Normocephalic and atraumatic.     Right Ear: Tympanic membrane and ear canal normal.     Left Ear: Tympanic membrane and ear canal normal.  Cardiovascular:     Rate and Rhythm: Normal  rate and regular rhythm.     Heart sounds: Normal heart sounds.  Pulmonary:     Effort: Pulmonary effort is normal.     Breath sounds: Wheezing, rhonchi and rales present.  Lymphadenopathy:     Cervical: No cervical adenopathy.  Neurological:     Mental Status: She is alert.          Assessment & Plan:  COPD exacerbation ([place]) Patient is having a COPD exacerbation.  Begin a prednisone taper pack coupled with albuterol 2 puffs every 4-6 hours as needed.  The question is whether this is bacterial induced versus COVID-pneumonia.  My COVID test will not come back until Monday.  The patient has a friend who has a home COVID test.  Therefore of asked her to go home and take the home COVID test.  If positive I will call out paxlovid in addition to the prednisone.  If negative, I want the patient to take a Z-Pak in addition to her prednisone.  Seek medical attention immediately if worsening over the weekend

## 2021-05-12 ENCOUNTER — Encounter (HOSPITAL_COMMUNITY): Payer: Self-pay

## 2021-05-12 ENCOUNTER — Inpatient Hospital Stay (HOSPITAL_COMMUNITY)
Admission: EM | Admit: 2021-05-12 | Discharge: 2021-05-16 | DRG: 177 | Disposition: A | Discharge status: Home / Self Care | Payer: Medicare Other | Attending: Internal Medicine | Admitting: Internal Medicine

## 2021-05-12 ENCOUNTER — Emergency Department (HOSPITAL_COMMUNITY): Payer: Medicare Other

## 2021-05-12 ENCOUNTER — Other Ambulatory Visit: Payer: Self-pay

## 2021-05-12 DIAGNOSIS — Z716 Tobacco abuse counseling: Secondary | ICD-10-CM

## 2021-05-12 DIAGNOSIS — I248 Other forms of acute ischemic heart disease: Secondary | ICD-10-CM | POA: Diagnosis present

## 2021-05-12 DIAGNOSIS — K219 Gastro-esophageal reflux disease without esophagitis: Secondary | ICD-10-CM | POA: Diagnosis present

## 2021-05-12 DIAGNOSIS — R778 Other specified abnormalities of plasma proteins: Secondary | ICD-10-CM

## 2021-05-12 DIAGNOSIS — R0902 Hypoxemia: Secondary | ICD-10-CM

## 2021-05-12 DIAGNOSIS — G4700 Insomnia, unspecified: Secondary | ICD-10-CM | POA: Diagnosis present

## 2021-05-12 DIAGNOSIS — F411 Generalized anxiety disorder: Secondary | ICD-10-CM | POA: Diagnosis present

## 2021-05-12 DIAGNOSIS — R001 Bradycardia, unspecified: Secondary | ICD-10-CM | POA: Diagnosis present

## 2021-05-12 DIAGNOSIS — J159 Unspecified bacterial pneumonia: Secondary | ICD-10-CM | POA: Diagnosis present

## 2021-05-12 DIAGNOSIS — C679 Malignant neoplasm of bladder, unspecified: Secondary | ICD-10-CM | POA: Diagnosis present

## 2021-05-12 DIAGNOSIS — U071 COVID-19: Principal | ICD-10-CM

## 2021-05-12 DIAGNOSIS — Z8551 Personal history of malignant neoplasm of bladder: Secondary | ICD-10-CM | POA: Diagnosis not present

## 2021-05-12 DIAGNOSIS — Z90710 Acquired absence of both cervix and uterus: Secondary | ICD-10-CM | POA: Diagnosis not present

## 2021-05-12 DIAGNOSIS — B965 Pseudomonas (aeruginosa) (mallei) (pseudomallei) as the cause of diseases classified elsewhere: Secondary | ICD-10-CM | POA: Diagnosis present

## 2021-05-12 DIAGNOSIS — Z7902 Long term (current) use of antithrombotics/antiplatelets: Secondary | ICD-10-CM

## 2021-05-12 DIAGNOSIS — I214 Non-ST elevation (NSTEMI) myocardial infarction: Secondary | ICD-10-CM | POA: Diagnosis not present

## 2021-05-12 DIAGNOSIS — F32A Depression, unspecified: Secondary | ICD-10-CM | POA: Diagnosis present

## 2021-05-12 DIAGNOSIS — I739 Peripheral vascular disease, unspecified: Secondary | ICD-10-CM | POA: Diagnosis present

## 2021-05-12 DIAGNOSIS — M810 Age-related osteoporosis without current pathological fracture: Secondary | ICD-10-CM | POA: Diagnosis present

## 2021-05-12 DIAGNOSIS — I2510 Atherosclerotic heart disease of native coronary artery without angina pectoris: Secondary | ICD-10-CM | POA: Diagnosis present

## 2021-05-12 DIAGNOSIS — Z23 Encounter for immunization: Secondary | ICD-10-CM | POA: Diagnosis not present

## 2021-05-12 DIAGNOSIS — Z888 Allergy status to other drugs, medicaments and biological substances status: Secondary | ICD-10-CM

## 2021-05-12 DIAGNOSIS — F17210 Nicotine dependence, cigarettes, uncomplicated: Secondary | ICD-10-CM | POA: Diagnosis present

## 2021-05-12 DIAGNOSIS — J440 Chronic obstructive pulmonary disease with acute lower respiratory infection: Secondary | ICD-10-CM | POA: Diagnosis present

## 2021-05-12 DIAGNOSIS — R062 Wheezing: Secondary | ICD-10-CM | POA: Diagnosis not present

## 2021-05-12 DIAGNOSIS — I10 Essential (primary) hypertension: Secondary | ICD-10-CM | POA: Diagnosis present

## 2021-05-12 DIAGNOSIS — J9601 Acute respiratory failure with hypoxia: Secondary | ICD-10-CM | POA: Diagnosis present

## 2021-05-12 DIAGNOSIS — Z720 Tobacco use: Secondary | ICD-10-CM | POA: Diagnosis present

## 2021-05-12 DIAGNOSIS — I25119 Atherosclerotic heart disease of native coronary artery with unspecified angina pectoris: Secondary | ICD-10-CM | POA: Diagnosis not present

## 2021-05-12 DIAGNOSIS — E785 Hyperlipidemia, unspecified: Secondary | ICD-10-CM | POA: Diagnosis present

## 2021-05-12 DIAGNOSIS — Z28310 Unvaccinated for covid-19: Secondary | ICD-10-CM

## 2021-05-12 DIAGNOSIS — J441 Chronic obstructive pulmonary disease with (acute) exacerbation: Secondary | ICD-10-CM | POA: Diagnosis present

## 2021-05-12 DIAGNOSIS — Z881 Allergy status to other antibiotic agents status: Secondary | ICD-10-CM

## 2021-05-12 DIAGNOSIS — R0789 Other chest pain: Secondary | ICD-10-CM | POA: Diagnosis not present

## 2021-05-12 DIAGNOSIS — Z955 Presence of coronary angioplasty implant and graft: Secondary | ICD-10-CM

## 2021-05-12 DIAGNOSIS — I7300 Raynaud's syndrome without gangrene: Secondary | ICD-10-CM | POA: Diagnosis present

## 2021-05-12 DIAGNOSIS — J449 Chronic obstructive pulmonary disease, unspecified: Secondary | ICD-10-CM | POA: Diagnosis present

## 2021-05-12 DIAGNOSIS — E039 Hypothyroidism, unspecified: Secondary | ICD-10-CM | POA: Diagnosis present

## 2021-05-12 DIAGNOSIS — Z9861 Coronary angioplasty status: Secondary | ICD-10-CM | POA: Diagnosis not present

## 2021-05-12 DIAGNOSIS — K449 Diaphragmatic hernia without obstruction or gangrene: Secondary | ICD-10-CM | POA: Diagnosis not present

## 2021-05-12 DIAGNOSIS — R0602 Shortness of breath: Secondary | ICD-10-CM

## 2021-05-12 DIAGNOSIS — Z22322 Carrier or suspected carrier of Methicillin resistant Staphylococcus aureus: Secondary | ICD-10-CM

## 2021-05-12 DIAGNOSIS — Z79890 Hormone replacement therapy (postmenopausal): Secondary | ICD-10-CM

## 2021-05-12 DIAGNOSIS — J9621 Acute and chronic respiratory failure with hypoxia: Secondary | ICD-10-CM | POA: Diagnosis present

## 2021-05-12 DIAGNOSIS — Z79899 Other long term (current) drug therapy: Secondary | ICD-10-CM

## 2021-05-12 HISTORY — DX: Malignant neoplasm of bladder, unspecified: C67.9

## 2021-05-12 LAB — CBC WITH DIFFERENTIAL/PLATELET
Abs Immature Granulocytes: 0.07 10*3/uL (ref 0.00–0.07)
Basophils Absolute: 0 10*3/uL (ref 0.0–0.1)
Basophils Relative: 0 %
Eosinophils Absolute: 0 10*3/uL (ref 0.0–0.5)
Eosinophils Relative: 0 %
HCT: 44.7 % (ref 36.0–46.0)
Hemoglobin: 14.9 g/dL (ref 12.0–15.0)
Immature Granulocytes: 1 %
Lymphocytes Relative: 11 %
Lymphs Abs: 1 10*3/uL (ref 0.7–4.0)
MCH: 31 pg (ref 26.0–34.0)
MCHC: 33.3 g/dL (ref 30.0–36.0)
MCV: 93.1 fL (ref 80.0–100.0)
Monocytes Absolute: 0.3 10*3/uL (ref 0.1–1.0)
Monocytes Relative: 3 %
Neutro Abs: 7.3 10*3/uL (ref 1.7–7.7)
Neutrophils Relative %: 85 %
Platelets: 378 10*3/uL (ref 150–400)
RBC: 4.8 MIL/uL (ref 3.87–5.11)
RDW: 15 % (ref 11.5–15.5)
WBC: 8.6 10*3/uL (ref 4.0–10.5)
nRBC: 0 % (ref 0.0–0.2)

## 2021-05-12 LAB — COMPREHENSIVE METABOLIC PANEL
ALT: 50 U/L — ABNORMAL HIGH (ref 0–44)
AST: 45 U/L — ABNORMAL HIGH (ref 15–41)
Albumin: 4.2 g/dL (ref 3.5–5.0)
Alkaline Phosphatase: 123 U/L (ref 38–126)
Anion gap: 9 (ref 5–15)
BUN: 17 mg/dL (ref 8–23)
CO2: 23 mmol/L (ref 22–32)
Calcium: 9 mg/dL (ref 8.9–10.3)
Chloride: 105 mmol/L (ref 98–111)
Creatinine, Ser: 0.79 mg/dL (ref 0.44–1.00)
GFR, Estimated: >60 mL/min (ref >60)
Glucose, Bld: 137 mg/dL — ABNORMAL HIGH (ref 70–99)
Potassium: 3.9 mmol/L (ref 3.5–5.1)
Sodium: 137 mmol/L (ref 135–145)
Total Bilirubin: 0.5 mg/dL (ref 0.3–1.2)
Total Protein: 8.1 g/dL (ref 6.5–8.1)

## 2021-05-12 LAB — PROTIME-INR
INR: 0.9 (ref 0.8–1.2)
Prothrombin Time: 12.1 seconds (ref 11.4–15.2)

## 2021-05-12 LAB — BRAIN NATRIURETIC PEPTIDE: B Natriuretic Peptide: 76 pg/mL (ref 0.0–100.0)

## 2021-05-12 LAB — RESP PANEL BY RT-PCR (FLU A&B, COVID) ARPGX2
Influenza A by PCR: NEGATIVE
Influenza B by PCR: NEGATIVE
SARS Coronavirus 2 by RT PCR: POSITIVE — AB

## 2021-05-12 LAB — TROPONIN I (HIGH SENSITIVITY)
Troponin I (High Sensitivity): 99 ng/L — ABNORMAL HIGH (ref <18)
Troponin I (High Sensitivity): 323 ng/L (ref <18)
Troponin I (High Sensitivity): 749 ng/L (ref <18)
Troponin I (High Sensitivity): 905 ng/L (ref <18)

## 2021-05-12 LAB — D-DIMER, QUANTITATIVE: D-Dimer, Quant: 0.42 ug/mL-FEU (ref 0.00–0.50)

## 2021-05-12 LAB — APTT: aPTT: 27 s (ref 24–36)

## 2021-05-12 LAB — HEPARIN LEVEL (UNFRACTIONATED): Heparin Unfractionated: 0.9 IU/mL — ABNORMAL HIGH (ref 0.30–0.70)

## 2021-05-12 MED ORDER — MORPHINE SULFATE (PF) 2 MG/ML IV SOLN
2.0000 mg | Freq: Once | INTRAVENOUS | Status: AC
  Administered 2021-05-12: 2 mg via INTRAVENOUS
  Filled 2021-05-12: qty 1

## 2021-05-12 MED ORDER — ZOLPIDEM TARTRATE 5 MG PO TABS
5.0000 mg | ORAL_TABLET | Freq: Every evening | ORAL | Status: DC | PRN
  Administered 2021-05-13 – 2021-05-16 (×4): 5 mg via ORAL
  Filled 2021-05-12 (×4): qty 1

## 2021-05-12 MED ORDER — SERTRALINE HCL 25 MG PO TABS
150.0000 mg | ORAL_TABLET | Freq: Every day | ORAL | Status: DC
  Administered 2021-05-14 – 2021-05-16 (×3): 150 mg via ORAL
  Filled 2021-05-12 (×3): qty 2

## 2021-05-12 MED ORDER — ACETAMINOPHEN 325 MG PO TABS
650.0000 mg | ORAL_TABLET | ORAL | Status: DC | PRN
  Administered 2021-05-13 (×2): 650 mg via ORAL
  Filled 2021-05-12 (×2): qty 2

## 2021-05-12 MED ORDER — LACTATED RINGERS IV SOLN: INTRAVENOUS | Status: DC

## 2021-05-12 MED ORDER — ALPRAZOLAM 0.5 MG PO TABS: 0.2500 mg | ORAL_TABLET | Freq: Two times a day (BID) | ORAL | Status: DC | PRN

## 2021-05-12 MED ORDER — MONTELUKAST SODIUM 10 MG PO TABS
10.0000 mg | ORAL_TABLET | Freq: Every day | ORAL | Status: DC
  Administered 2021-05-12 – 2021-05-15 (×4): 10 mg via ORAL
  Filled 2021-05-12 (×4): qty 1

## 2021-05-12 MED ORDER — HYDRALAZINE HCL 50 MG PO TABS
50.0000 mg | ORAL_TABLET | Freq: Four times a day (QID) | ORAL | Status: DC
  Administered 2021-05-12 – 2021-05-16 (×13): 50 mg via ORAL
  Filled 2021-05-12 (×9): qty 1
  Filled 2021-05-12: qty 2
  Filled 2021-05-12 (×3): qty 1

## 2021-05-12 MED ORDER — ASPIRIN 81 MG PO CHEW
324.0000 mg | CHEWABLE_TABLET | Freq: Once | ORAL | Status: AC
  Administered 2021-05-12: 324 mg via ORAL
  Filled 2021-05-12: qty 4

## 2021-05-12 MED ORDER — ASPIRIN 325 MG PO TABS: 325.0000 mg | ORAL_TABLET | Freq: Every day | ORAL | Status: DC

## 2021-05-12 MED ORDER — ONDANSETRON HCL 4 MG/2ML IJ SOLN: 4.0000 mg | Freq: Four times a day (QID) | INTRAMUSCULAR | Status: DC | PRN

## 2021-05-12 MED ORDER — METHYLPREDNISOLONE SODIUM SUCC 125 MG IJ SOLR
125.0000 mg | Freq: Once | INTRAMUSCULAR | Status: AC
  Administered 2021-05-12: 125 mg via INTRAVENOUS
  Filled 2021-05-12: qty 2

## 2021-05-12 MED ORDER — CLOPIDOGREL BISULFATE 75 MG PO TABS
75.0000 mg | ORAL_TABLET | Freq: Every day | ORAL | Status: DC
  Administered 2021-05-14 – 2021-05-16 (×3): 75 mg via ORAL
  Filled 2021-05-12 (×3): qty 1

## 2021-05-12 MED ORDER — PANTOPRAZOLE SODIUM 40 MG PO TBEC
40.0000 mg | DELAYED_RELEASE_TABLET | ORAL | Status: DC
  Administered 2021-05-14 – 2021-05-16 (×3): 40 mg via ORAL
  Filled 2021-05-12 (×3): qty 1

## 2021-05-12 MED ORDER — HEPARIN (PORCINE) 25000 UT/250ML-% IV SOLN
750.0000 [IU]/h | INTRAVENOUS | Status: DC
  Administered 2021-05-12: 900 [IU]/h via INTRAVENOUS
  Filled 2021-05-12 (×3): qty 250

## 2021-05-12 MED ORDER — CLOPIDOGREL BISULFATE 75 MG PO TABS: 75.0000 mg | ORAL_TABLET | Freq: Every day | ORAL | Status: DC

## 2021-05-12 MED ORDER — SERTRALINE HCL 50 MG PO TABS: 150.0000 mg | ORAL_TABLET | Freq: Every day | ORAL | Status: DC

## 2021-05-12 MED ORDER — UMECLIDINIUM BROMIDE 62.5 MCG/INH IN AEPB
2.0000 | INHALATION_SPRAY | RESPIRATORY_TRACT | Status: DC
  Administered 2021-05-14 – 2021-05-16 (×3): 2 via RESPIRATORY_TRACT
  Filled 2021-05-12: qty 7

## 2021-05-12 MED ORDER — NITROGLYCERIN 0.4 MG SL SUBL
0.4000 mg | SUBLINGUAL_TABLET | SUBLINGUAL | Status: DC | PRN
  Filled 2021-05-12: qty 1

## 2021-05-12 MED ORDER — ENALAPRIL MALEATE 2.5 MG PO TABS
2.5000 mg | ORAL_TABLET | Freq: Every day | ORAL | Status: DC
  Administered 2021-05-14 – 2021-05-16 (×3): 2.5 mg via ORAL
  Filled 2021-05-12 (×6): qty 1

## 2021-05-12 MED ORDER — ASPIRIN EC 81 MG PO TBEC
81.0000 mg | DELAYED_RELEASE_TABLET | Freq: Every day | ORAL | Status: DC
  Administered 2021-05-14 – 2021-05-16 (×3): 81 mg via ORAL
  Filled 2021-05-12 (×3): qty 1

## 2021-05-12 MED ORDER — ALBUTEROL SULFATE HFA 108 (90 BASE) MCG/ACT IN AERS
2.0000 | INHALATION_SPRAY | Freq: Four times a day (QID) | RESPIRATORY_TRACT | Status: DC | PRN
  Administered 2021-05-13 – 2021-05-15 (×5): 2 via RESPIRATORY_TRACT
  Filled 2021-05-12: qty 6.7

## 2021-05-12 MED ORDER — PREDNISONE 10 MG PO TABS: 10.0000 mg | ORAL_TABLET | Freq: Every day | ORAL | Status: DC

## 2021-05-12 MED ORDER — LEVOTHYROXINE SODIUM 88 MCG PO TABS
88.0000 ug | ORAL_TABLET | Freq: Every day | ORAL | Status: DC
  Administered 2021-05-14 – 2021-05-16 (×3): 88 ug via ORAL
  Filled 2021-05-12 (×3): qty 1

## 2021-05-12 MED ORDER — HYOSCYAMINE SULFATE 0.125 MG SL SUBL
0.2500 mg | SUBLINGUAL_TABLET | Freq: Once | SUBLINGUAL | Status: AC
  Administered 2021-05-12: 0.25 mg via SUBLINGUAL
  Filled 2021-05-12: qty 2

## 2021-05-12 MED ORDER — HEPARIN BOLUS VIA INFUSION
4000.0000 [IU] | Freq: Once | INTRAVENOUS | Status: AC
  Administered 2021-05-12: 4000 [IU] via INTRAVENOUS

## 2021-05-12 MED ORDER — PRAVASTATIN SODIUM 40 MG PO TABS
20.0000 mg | ORAL_TABLET | ORAL | Status: DC
  Administered 2021-05-14 – 2021-05-16 (×3): 20 mg via ORAL
  Filled 2021-05-12 (×3): qty 1

## 2021-05-12 NOTE — H&P (Signed)
History and Physical    Alexa Orozco [id number] DOB: 10-03-1945 DOA: 05/12/2021  PCP: Susy Frizzle, MD  Patient coming from: Home  I have personally briefly reviewed patient's old medical records in [place]  Chief Complaint: Chest pain  HPI: Alexa Orozco is a 75 y.o. female with medical history significant of COPD, CAD, GERD, hypothyroid, bladder cancer, PVD, HTN, ongoing tobacco use who developed fever, cough, and congestion approximately 1 week ago.  Patient was seen by her primary care physician on Friday and started on prednisone taper as well as Azo throat for presumed COPD exacerbation.  Patient reports a negative COVID test at home on Friday and on Sunday. Today she reports waking up with substernal chest pressure that seems greater to the right but radiates to her neck.  She has associated diaphoresis.  She reports that this pain is worse with activity and improved with rest.  She also reports associated shortness of breath and weakness.  She is not on oxygen at home. ED Course: In the ED patient was given IV steroids, albuterol treatment and was placed on 4 L of nasal cannula for oxygen saturations in the mid 80s.  And had an EKG that was nonrevealing.  Troponins were added and her initial troponin was elevated at 99.  Cardiology was consulted and they asked for admission.  Subsequent troponins have been 323 and 749.  EKG with recurrent chest pain revealed ST segment depression but no elevation.  Patient was started on a heparin drip, given nitroglycerin for chest pain, given a full dose aspirin, continued on her Plavix.  Beta-blockers were held due to bradycardia as well as patient has a history of hives to Bystolic.  Cardiology did not recommend immediate catheterization and were concerned this might be demand ischemia secondary to COVID.  Review of Systems: As per HPI otherwise 10 point review of systems negative.   Past Medical History:  Diagnosis Date   Allergies     Allergy    Anxiety    Asthma    Bladder cancer ([place])    CAD S/P percutaneous coronary angioplasty 10/2005   Class III Angina --> Cath: 85% -- PCI 3.56m x 1724m(4.0 mm) Cypher DES to RCA; Myoview October 2014: normal stress test: LOW RISK; mild anteroapical breast attenuation   Cataract    h/o bilat repair   Claudication ([facility]/10/2012   COPD (chronic obstructive pulmonary disease) ([facility]   Depression 11/29/2013   Emphysema of lung (HCC)    GERD (gastroesophageal reflux disease)    History of palpitations    History of tobacco abuse    Hyperlipidemia    Hypertension    well-controlled   Hypothyroidism    Osteoporosis    Raynaud disease     Past Surgical History:  Procedure Laterality Date   ABDOMINAL HYSTERECTOMY     h/o partila hysterectomy   BREAST BIOPSY  1988   CORONARY ANGIOPLASTY WITH STENT PLACEMENT  10/14/2005   cypher DES (3.24m17m8mm) to high grade RCA lesion   NM MYOVIEW LTD  July 2012 of October 2014   '12: dipyridamole; Normal, low risk study; 2014 - normal stress test: LOW RISK; mild anteroapical breast attenuation   TONSILLECTOMY  1959   TRANSTHORACIC ECHOCARDIOGRAM  05/22/2009   EF=>55%, normal LV systolic function; normal RV systolic function; mild MR/TR   TRANSURETHRAL RESECTION OF BLADDER TUMOR N/A 11/08/2020   Procedure: TRANSURETHRAL RESECTION OF BLADDER TUMOR (TURBT);  Surgeon: SniBilley CoD;  Location: [facility] ORS;  Service: Urology;  Laterality: N/A;   TRANSURETHRAL RESECTION OF BLADDER TUMOR N/A 12/13/2020   Procedure: TRANSURETHRAL RESECTION OF BLADDER TUMOR (TURBT);  Surgeon: Billey Co, MD;  Location: [facility] ORS;  Service: Urology;  Laterality: N/A;     reports that she has been smoking cigarettes. She has a 17.50 pack-year smoking history. She has never used smokeless tobacco. She reports that she does not drink alcohol and does not use drugs.  Allergies  Allergen Reactions   Bystolic [Nebivolol Hcl] Hives   Cephalexin Hives   Effexor  [Venlafaxine] Other (See Comments)    Made her face hot and red   Levaquin [Levofloxacin In D5w] Other (See Comments)    Caused face to turn red and patient became extremely hot     Family History  Problem Relation Age of Onset   Liver cancer Mother    Heart attack Mother    Heart Problems Sister    Lupus Sister    Liver cancer Brother    Asthma Brother    Breast cancer Neg Hx    Prostate cancer Neg Hx    Bladder Cancer Neg Hx    Kidney cancer Neg Hx      Prior to Admission medications   Medication Sig Start Date End Date Taking? Authorizing Provider  acetaminophen (TYLENOL) 500 MG tablet Take 500-1,000 mg by mouth every 6 (six) hours as needed (pain).   Yes [provider]  albuterol (PROVENTIL) (2.5 MG/3ML) 0.083% nebulizer solution Take 3 mLs (2.5 mg total) by nebulization every 6 (six) hours as needed for wheezing or shortness of breath. 08/09/20  Yes Eulogio Bear, NP  albuterol (VENTOLIN HFA) 108 (90 Base) MCG/ACT inhaler Inhale 2 puffs into the lungs every 6 (six) hours as needed for wheezing or shortness of breath. Patients insurance requires CFC-free aerosol.  Please dispense only that 01/24/21  Yes Susy Frizzle, MD  azithromycin [facility]) 250 MG tablet 2 tabs poqday1, 1 tab poqday 2-5 05/09/21  Yes Susy Frizzle, MD  clopidogrel (PLAVIX) 75 MG tablet Take 1 tablet (75 mg total) by mouth daily. 01/15/21  Yes Susy Frizzle, MD  cromolyn (OPTICROM) 4 % ophthalmic solution Place 1 drop into both eyes 4 (four) times daily. 06/16/16  Yes [provider]  Eszopiclone 3 MG TABS TAKE 1 TABLET BY MOUTH IMMEDIATELY BEFORE BEDTIME Patient taking differently: Take 3 mg by mouth at bedtime. 04/24/21  Yes Susy Frizzle, MD  levothyroxine (SYNTHROID) 88 MCG tablet Take 1 tablet (88 mcg total) by mouth daily before breakfast. 01/15/21  Yes Susy Frizzle, MD  meclizine (ANTIVERT) 25 MG tablet Take 0.5 tablets (12.5 mg total) by mouth 3 (three) times  daily as needed for dizziness. 02/06/21  Yes Noemi Chapel A, NP  montelukast (SINGULAIR) 10 MG tablet TAKE 1 TABLET BY MOUTH EVERYDAY AT BEDTIME 01/15/21  Yes Susy Frizzle, MD  nitroGLYCERIN (NITROSTAT) 0.4 MG SL tablet Place 1 tablet (0.4 mg total) under the tongue every 5 (five) minutes as needed for chest pain. 03/10/18  Yes Dena Billet B, PA-C  pantoprazole (PROTONIX) 40 MG tablet Take 1 tablet (40 mg total) by mouth every morning. 01/15/21  Yes Susy Frizzle, MD  pravastatin (PRAVACHOL) 20 MG tablet Take 1 tablet (20 mg total) by mouth every morning. 01/15/21  Yes Susy Frizzle, MD  predniSONE (DELTASONE) 20 MG tablet 3 tabs poqday 1-2, 2 tabs poqday 3-4, 1 tab poqday 5-6 05/09/21  Yes Pickard, Cammie Mcgee, MD  sertraline (ZOLOFT) 100 MG tablet Take 1.5 tablets (150 mg total) by mouth daily. 02/06/21  Yes Eulogio Bear, NP  Tiotropium Bromide Monohydrate (SPIRIVA RESPIMAT) 2.5 MCG/ACT AERS Inhale 2 puffs into the lungs every morning. 01/15/21  Yes Susy Frizzle, MD  clonazePAM (KLONOPIN) 0.5 MG tablet Take 0.5-1 tablets (0.25-0.5 mg total) by mouth daily as needed for anxiety. Patient not taking: No sig reported 08/09/20   Noemi Chapel A, NP  tiZANidine (ZANAFLEX) 4 MG tablet Take 1 tablet (4 mg total) by mouth every 6 (six) hours as needed for muscle spasms. Patient not taking: No sig reported 05/31/20   Susy Frizzle, MD  Trospium Chloride 60 MG CP24 Take 1 capsule (60 mg total) by mouth daily. Patient not taking: No sig reported 04/25/21   Billey Co, MD    Physical Exam: Vitals:   05/12/21 1323 05/12/21 1400 05/12/21 1800 05/12/21 1900  BP:  (!) 164/70 140/65 (!) 177/90  Pulse:  (!) 57 60 67  Resp:  (!) 27 (!) 36 (!) 21  Temp: 98.3 F (36.8 C)     TempSrc: Oral     SpO2:  95% 92% 93%  Weight:      Height:        Constitutional: NAD, calm, comfortable Eyes: PERRL, lids and conjunctivae normal ENMT: Mucous membranes are moist. Posterior pharynx clear  of any exudate or lesions.Normal dentition.  Neck: normal, supple, no masses, no thyromegaly Respiratory: clear to auscultation bilaterally, no wheezing, no crackles. Normal respiratory effort. No accessory muscle use.  Cardiovascular: Regular rate and rhythm, no murmurs / rubs / gallops. No extremity edema. 2+ pedal pulses. No carotid bruits.  Positive JVD Abdomen: no tenderness, no masses palpated. No hepatosplenomegaly. Bowel sounds positive.  Musculoskeletal: no clubbing / cyanosis. No joint deformity upper and lower extremities. Good ROM, no contractures. Normal muscle tone.  Skin: no rashes, lesions, ulcers. No induration Neurologic: Grossly normal Psychiatric: Normal judgment and insight. Alert and oriented x 3. Normal mood.   Labs on Admission: I have personally reviewed following labs and imaging studies  CBC: Recent Labs  Lab 05/12/21 1353  WBC 8.6  NEUTROABS 7.3  HGB 14.9  HCT 44.7  MCV 93.1  PLT [id number]   Basic Metabolic Panel: Recent Labs  Lab 05/12/21 1353  NA 137  K 3.9  CL 105  CO2 23  GLUCOSE 137*  BUN 17  CREATININE 0.79  CALCIUM 9.0   GFR: Estimated Creatinine Clearance: 58.7 mL/min (by C-G formula based on SCr of 0.79 mg/dL). Liver Function Tests: Recent Labs  Lab 05/12/21 1353  AST 45*  ALT 50*  ALKPHOS 123  BILITOT 0.5  PROT 8.1  ALBUMIN 4.2    Coagulation Profile: Recent Labs  Lab 05/12/21 1353  INR 0.9    Urine analysis:    Component Value Date/Time   COLORURINE DARK YELLOW 12/23/2020 1136   APPEARANCEUR Clear 03/28/2021 1441   LABSPEC 1.025 12/23/2020 1136   PHURINE 6.5 12/23/2020 1136   GLUCOSEU Negative 03/28/2021 1441   HGBUR 3+ (A) 12/23/2020 1136   BILIRUBINUR Negative 03/28/2021 1441   KETONESUR NEGATIVE 12/23/2020 1136   PROTEINUR Negative 03/28/2021 1441   PROTEINUR 2+ (A) 12/23/2020 1136   NITRITE Negative 03/28/2021 1441   NITRITE POSITIVE (A) 12/23/2020 1136   LEUKOCYTESUR Negative 03/28/2021 1441   LEUKOCYTESUR  3+ (A) 12/23/2020 1136    Radiological Exams on Admission: DG Chest Port 1 View  Result Date: 05/12/2021 CLINICAL DATA:  Shortness of breath  EXAM: PORTABLE CHEST 1 VIEW COMPARISON:  Chest radiograph 08/29/2018 FINDINGS: The cardiomediastinal silhouette is normal. The lungs are hyperinflated consistent with COPD. There is no focal consolidation. There is no pulmonary edema. There is no pleural effusion or pneumothorax. There is biapical pleural scarring, unchanged. There is no acute osseous abnormality. IMPRESSION: Hyperinflated lungs consistent with COPD. No focal consolidation or pleural effusion. Electronically Signed   By: Valetta Mole M.D.   On: 05/12/2021 14:46    EKG: Independently reviewed.  Sinus bradycardia, borderline right axis deviation  Assessment/Plan Principal Problem:   NSTEMI (non-ST elevated myocardial infarction) ([place]) Active Problems:   Essential hypertension   Hypothyroid   COPD (chronic obstructive pulmonary disease) (HCC)   Tobacco abuse   CAD S/P percutaneous coronary angioplasty   Dyslipidemia   Claudication of left lower extremity (HCC)   Depression   Generalized anxiety disorder   Bladder cancer ([place])   COVID-19 virus infection   Acute on chronic respiratory failure with hypoxia (HCC)   NSTEMI in setting of of known coronary artery disease status post stenting Worsening troponin serially For echo in the morning Cardiology has been consulted Heparin GTT Antiplatelet meds to include aspirin 325 today and 81 mg ongoing and Plavix Nitroglycerin for chest pain Serial EKGs On statin Holding beta-blockers due to bradycardia and previous allergy  COVID-19 with acute respiratory failure with hypoxia Given oxygen requirements will continue prednisone  COPD Continue her inhalers  Peripheral vascular disease with claudication Continue Plavix and aspirin  Continue Pravachol  Hypertension On no antihypertensives on admission have added Vasotec and  hydralazine for blood pressure control.  Ongoing tobacco use Encourage cessation  Hyperlipidemia Continue statin  Bladder cancer Status post surgical removal and immunotherapy which has recently completed.  Hypothyroidism Continue levothyroxine  Depression and anxiety Continue Zoloft and alprazolam  DVT prophylaxis: On: Heparin GTT Code Status: Full code discussed with patient today Family Communication: Patient at bedside Disposition Plan: Home Consults called: Cardiology Admission status: Inpatient   Donnamae Jude MD [facility]  If 7PM-7AM, please contact night-coverage 05/12/2021, 7:24 PM

## 2021-05-12 NOTE — ED Notes (Signed)
Report given to [facility] 

## 2021-05-12 NOTE — ED Provider Notes (Signed)
[facility] EMERGENCY DEPARTMENT Provider Note   CSN: [id number] Arrival date & time: 05/12/21  1311     History Chief Complaint  Patient presents with   Shortness of Breath    Alexa Orozco is a 75 y.o. female.  Patient presents with some shortness of breath and chest discomfort  The history is provided by the patient and medical records.  Shortness of Breath Severity:  Moderate Onset quality:  Sudden Timing:  Constant Progression:  Worsening Chronicity:  Recurrent Context: activity   Worsened by:  Nothing Ineffective treatments:  None tried Associated symptoms: no abdominal pain, no chest pain, no cough, no headaches and no rash       Past Medical History:  Diagnosis Date   Allergies    Allergy    Angina pectoris, unspecified ([place])    Anxiety    Asthma    CAD S/P percutaneous coronary angioplasty February 2007   Class III Angina --> Cath: 85% -- PCI 3.64m x 156m(4.0 mm) Cypher DES to RCA; Myoview October 2014: normal stress test: LOW RISK; mild anteroapical breast attenuation   Cataract    h/o bilat repair   Claudication ([facility]10/10/2012   COPD (chronic obstructive pulmonary disease) ([facility] [place]   Depression 11/29/2013   Emphysema of lung (HCC)    GERD (gastroesophageal reflux disease)    History of palpitations    History of tobacco abuse    Hyperlipidemia    Hypertension    well-controlled   Hypothyroidism    Osteoporosis    Raynaud disease     Patient Active Problem List   Diagnosis Date Noted   Insomnia 09/19/2014   Generalized anxiety disorder 09/19/2014   COPD exacerbation ([place]12/21/2015   Depression 11/29/2013   Osteoporosis    Obesity (BMI 30-39.9) 05/31/2013   Stable angina (HCHooper10/08/2012   Claudication of left lower extremity (HCRank[place]10/08/2012   CAD S/P percutaneous coronary angioplasty    Dyslipidemia    Hirsutism 03/03/2011   Essential hypertension    Hypothyroid    Raynaud's disease    COPD (chronic obstructive pulmonary disease) ([place]    Tobacco abuse     Past Surgical History:  Procedure Laterality Date   ABDOMINAL HYSTERECTOMY     h/o partila hysterectomy   BREAST BIOPSY  1988   CORONARY ANGIOPLASTY WITH STENT PLACEMENT  10/14/2005   cypher DES (3.52m27m8mm) to high grade RCA lesion   NM MYOVIEW LTD  July 2012 of October 2014   '12: dipyridamole; Normal, low risk study; 2014 - normal stress test: LOW RISK; mild anteroapical breast attenuation   TONSILLECTOMY  1959   TRANSTHORACIC ECHOCARDIOGRAM  05/22/2009   EF=>55%, normal LV systolic function; normal RV systolic function; mild MR/TR   TRANSURETHRAL RESECTION OF BLADDER TUMOR N/A 11/08/2020   Procedure: TRANSURETHRAL RESECTION OF BLADDER TUMOR (TURBT);  Surgeon: SniBilley CoD;  Location: [facility] ORS;  Service: Urology;  Laterality: N/A;   TRANSURETHRAL RESECTION OF BLADDER TUMOR N/A 12/13/2020   Procedure: TRANSURETHRAL RESECTION OF BLADDER TUMOR (TURBT);  Surgeon: SniBilley CoD;  Location: [facility] ORS;  Service: Urology;  Laterality: N/A;     OB History   No obstetric history on file.     Family History  Problem Relation Age of Onset   Liver cancer Mother    Heart attack Mother    Heart Problems Sister    Lupus Sister    Liver cancer Brother    Asthma Brother    Breast cancer Neg  Hx    Prostate cancer Neg Hx    Bladder Cancer Neg Hx    Kidney cancer Neg Hx     Social History   Tobacco Use   Smoking status: Every Day    Packs/day: 0.50    Years: 35.00    Pack years: 17.50    Types: Cigarettes   Smokeless tobacco: Never   Tobacco comments:    11/11/18 still at 1/2ppd  Vaping Use   Vaping Use: Never used  Substance Use Topics   Alcohol use: No   Drug use: No    Home Medications Prior to Admission medications   Medication Sig Start Date End Date Taking? Authorizing Provider  acetaminophen (TYLENOL) 500 MG tablet Take 500-1,000 mg by mouth every 6 (six) hours as needed (pain).    [provider]  albuterol (PROVENTIL) (2.5  MG/3ML) 0.083% nebulizer solution Take 3 mLs (2.5 mg total) by nebulization every 6 (six) hours as needed for wheezing or shortness of breath. 08/09/20   Eulogio Bear, NP  albuterol (VENTOLIN HFA) 108 (90 Base) MCG/ACT inhaler Inhale 2 puffs into the lungs every 6 (six) hours as needed for wheezing or shortness of breath. Patients insurance requires CFC-free aerosol.  Please dispense only that 01/24/21   Susy Frizzle, MD  azithromycin [facility]) 250 MG tablet 2 tabs poqday1, 1 tab poqday 2-5 05/09/21   Susy Frizzle, MD  clonazePAM (KLONOPIN) 0.5 MG tablet Take 0.5-1 tablets (0.25-0.5 mg total) by mouth daily as needed for anxiety. Patient taking differently: Take 0.5 mg by mouth daily as needed for anxiety. 08/09/20   Eulogio Bear, NP  clopidogrel (PLAVIX) 75 MG tablet Take 1 tablet (75 mg total) by mouth daily. 01/15/21   Susy Frizzle, MD  cromolyn (OPTICROM) 4 % ophthalmic solution Place 1 drop into both eyes 4 (four) times daily. 06/16/16   [provider]  Eszopiclone 3 MG TABS TAKE 1 TABLET BY MOUTH IMMEDIATELY BEFORE BEDTIME 04/24/21   Susy Frizzle, MD  levothyroxine (SYNTHROID) 88 MCG tablet Take 1 tablet (88 mcg total) by mouth daily before breakfast. 01/15/21   Susy Frizzle, MD  meclizine (ANTIVERT) 25 MG tablet Take 0.5 tablets (12.5 mg total) by mouth 3 (three) times daily as needed for dizziness. 02/06/21   Eulogio Bear, NP  montelukast (SINGULAIR) 10 MG tablet TAKE 1 TABLET BY MOUTH EVERYDAY AT BEDTIME 01/15/21   Susy Frizzle, MD  nitroGLYCERIN (NITROSTAT) 0.4 MG SL tablet Place 1 tablet (0.4 mg total) under the tongue every 5 (five) minutes as needed for chest pain. 03/10/18   Orlena Sheldon, PA-C  pantoprazole (PROTONIX) 40 MG tablet Take 1 tablet (40 mg total) by mouth every morning. 01/15/21   Susy Frizzle, MD  pravastatin (PRAVACHOL) 20 MG tablet Take 1 tablet (20 mg total) by mouth every morning. 01/15/21   Susy Frizzle, MD   predniSONE (DELTASONE) 20 MG tablet 3 tabs poqday 1-2, 2 tabs poqday 3-4, 1 tab poqday 5-6 05/09/21   Susy Frizzle, MD  sertraline (ZOLOFT) 100 MG tablet Take 1.5 tablets (150 mg total) by mouth daily. 02/06/21   Eulogio Bear, NP  Tiotropium Bromide Monohydrate (SPIRIVA RESPIMAT) 2.5 MCG/ACT AERS Inhale 2 puffs into the lungs every morning. 01/15/21   Susy Frizzle, MD  tiZANidine (ZANAFLEX) 4 MG tablet Take 1 tablet (4 mg total) by mouth every 6 (six) hours as needed for muscle spasms. Patient not taking: Reported on 05/09/2021 05/31/20  Susy Frizzle, MD  Trospium Chloride 60 MG CP24 Take 1 capsule (60 mg total) by mouth daily. Patient not taking: Reported on 05/09/2021 04/25/21   Billey Co, MD    Allergies    Bystolic [nebivolol hcl], Cephalexin, Effexor [venlafaxine], and Levaquin [levofloxacin in d5w]  Review of Systems   Review of Systems  Constitutional:  Negative for appetite change and fatigue.  HENT:  Negative for congestion, ear discharge and sinus pressure.   Eyes:  Negative for discharge.  Respiratory:  Positive for shortness of breath. Negative for cough.   Cardiovascular:  Negative for chest pain.  Gastrointestinal:  Negative for abdominal pain and diarrhea.  Genitourinary:  Negative for frequency and hematuria.  Musculoskeletal:  Negative for back pain.  Skin:  Negative for rash.  Neurological:  Negative for seizures and headaches.  Psychiatric/Behavioral:  Negative for hallucinations.    Physical Exam Updated Vital Signs BP (!) 164/70   Pulse (!) 57   Temp 98.3 F (36.8 C) (Oral)   Resp (!) 27   Ht '5\' 2"'$  (1.575 m)   Wt 73.4 kg   SpO2 95%   BMI 29.61 kg/m   Physical Exam Vitals and nursing note reviewed.  Constitutional:      Appearance: She is well-developed.  HENT:     Head: Normocephalic.     Nose: Nose normal.  Eyes:     General: No scleral icterus.    Conjunctiva/sclera: Conjunctivae normal.  Neck:     Thyroid: No thyromegaly.   Cardiovascular:     Rate and Rhythm: Normal rate and regular rhythm.     Heart sounds: No murmur heard.   No friction rub. No gallop.  Pulmonary:     Breath sounds: No stridor. No wheezing or rales.  Chest:     Chest wall: No tenderness.  Abdominal:     General: There is no distension.     Tenderness: There is no abdominal tenderness. There is no rebound.  Musculoskeletal:        General: Normal range of motion.     Cervical back: Neck supple.  Lymphadenopathy:     Cervical: No cervical adenopathy.  Skin:    Findings: No erythema or rash.  Neurological:     Mental Status: She is alert and oriented to person, place, and time.     Motor: No abnormal muscle tone.     Coordination: Coordination normal.  Psychiatric:        Behavior: Behavior normal.    ED Results / Procedures / Treatments   Labs (all labs ordered are listed, but only abnormal results are displayed) Labs Reviewed  COMPREHENSIVE METABOLIC PANEL - Abnormal; Notable for the following components:      Result Value   Glucose, Bld 137 (*)    AST 45 (*)    ALT 50 (*)    All other components within normal limits  TROPONIN I (HIGH SENSITIVITY) - Abnormal; Notable for the following components:   Troponin I (High Sensitivity) 99 (*)    All other components within normal limits  RESP PANEL BY RT-PCR (FLU A&B, COVID) ARPGX2  CBC WITH DIFFERENTIAL/PLATELET  D-DIMER, QUANTITATIVE  TROPONIN I (HIGH SENSITIVITY)    EKG EKG Interpretation  Date/Time:  Monday May 12 2021 13:23:29 EDT Ventricular Rate:  59 PR Interval:  146 QRS Duration: 90 QT Interval:  457 QTC Calculation: 453 R Axis:   87 Text Interpretation: Sinus rhythm Borderline right axis deviation Confirmed by Milton Ferguson [phone]) on 05/12/2021  3:01:06 PM  Radiology DG Chest Port 1 View  Result Date: 05/12/2021 CLINICAL DATA:  Shortness of breath EXAM: PORTABLE CHEST 1 VIEW COMPARISON:  Chest radiograph 08/29/2018 FINDINGS: The cardiomediastinal  silhouette is normal. The lungs are hyperinflated consistent with COPD. There is no focal consolidation. There is no pulmonary edema. There is no pleural effusion or pneumothorax. There is biapical pleural scarring, unchanged. There is no acute osseous abnormality. IMPRESSION: Hyperinflated lungs consistent with COPD. No focal consolidation or pleural effusion. Electronically Signed   By: Valetta Mole M.D.   On: 05/12/2021 14:46    Procedures Procedures   Medications Ordered in ED Medications  morphine 2 MG/ML injection 2 mg (2 mg Intravenous Given 05/12/21 1405)  methylPREDNISolone sodium succinate (SOLU-MEDROL) 125 mg/2 mL injection 125 mg (125 mg Intravenous Given 05/12/21 1405)    ED Course  I have reviewed the triage vital signs and the nursing notes.  Pertinent labs & imaging results that were available during my care of the patient were reviewed by me and considered in my medical decision making (see chart for details).    MDM Rules/Calculators/A&P                           Patient with COPD.  Patient also with chest discomfort and elevated troponins.  Spoke with cardiology and the patient will be admitted to medicine with cardiology consult tomorrow.  Patient no longer having chest discomfort Final Clinical Impression(s) / ED Diagnoses Final diagnoses:  NSTEMI (non-ST elevated myocardial infarction) [facility])    Rx / DC Orders ED Discharge Orders     None        Milton Ferguson, MD 05/15/21 1017

## 2021-05-12 NOTE — ED Triage Notes (Signed)
Pt presents to ED via [facility] with complaints of worsening SOB started with ambulation to bathroom prior to calling EMS. Pt sats 92% on RA upon EMS arrival. Pt had used rescue inhaler and albuterol neb PTA of EMS along with zpack and prednisone previously prescribed by PCP. Pt c/o mid chest pain and jaw pain, EKG showed sinus brady at 57. Pt given duoneb by EMS, placed on 4L per [place], wheezing noted in bilateral upper lobes. Pt now c/o right chest pressure, neck pain, headache, and jaw pain.

## 2021-05-12 NOTE — ED Notes (Signed)
CRITICAL VALUE- trop 749  Reported to Dr Kennon Rounds and Primary RN at this time.  No new orders-pt to be admitted

## 2021-05-12 NOTE — ED Provider Notes (Signed)
  Physical Exam  BP (!) 164/70   Pulse (!) 57   Temp 98.3 F (36.8 C) (Oral)   Resp (!) 27   Ht '5\' 2"'$  (1.575 m)   Wt 73.4 kg   SpO2 95%   BMI 29.61 kg/m   Physical Exam  ED Course/Procedures     .Critical Care Performed by: Varney Biles, MD Authorized by: Varney Biles, MD   Critical care provider statement:    Critical care time (minutes):  32   Critical care was necessary to treat or prevent imminent or life-threatening deterioration of the following conditions:  Cardiac failure   Critical care was time spent personally by me on the following activities:  Discussions with consultants, evaluation of patient's response to treatment, examination of patient, ordering and performing treatments and interventions, ordering and review of laboratory studies, ordering and review of radiographic studies, pulse oximetry, re-evaluation of patient's condition, obtaining history from patient or surrogate, review of old charts, interpretation of cardiac output measurements and development of treatment plan with patient or surrogate   I assumed direction of critical care for this patient from another provider in my specialty: yes     Care discussed with comment:  Cardiology  MDM   75 year old female comes into the ER with chief complaint of shortness of breath.  I was asked to see the patient after her high-sensitivity troponin was noted to be rising.  Prior to seeing her, it was noted that patient is COVID-positive.  She also has seen her PCP for shortness of breath and was prescribed azithromycin and prednisone.  She is having some typical features with her chest pain of radiation to the jaw, and neck.  I spoke with the patient.  She reports that although she has been feeling unwell for the last week or so, she started having chest tightness on the right side earlier today.  Currently she is not having any radiation, but the tightness is continuous and persistent.  She has history of CAD  with stent several years ago.  I had ordered a repeat EKG, it does not show any acute changes.  Dr. Domenic Polite, cardiology service contacted me (it appears that he was paged by Dr. Kennon Rounds, admitting doctor).  We discussed the rising troponin in the setting of some typical sounding chest pain and positive COVID-19.  Also discussed the EKG that has not changed.  Dr. Domenic Polite has recommended that we start heparin on the patient and they will see the patient.  Continue with admission at [facility].  I have shared the recommendation with Dr. Kennon Rounds, admitting service as well.   EKG Interpretation  Date/Time:  Monday May 12 2021 17:36:43 EDT Ventricular Rate:  62 PR Interval:  146 QRS Duration: 90 QT Interval:  455 QTC Calculation: 463 R Axis:   90 Text Interpretation: Sinus rhythm Borderline right axis deviation Borderline low voltage, extremity leads Borderline repolarization abnormality unchanged Confirmed by Varney Biles [phone]) on 05/12/2021 6:46:37 PM                 Varney Biles, MD 05/12/21 1850

## 2021-05-12 NOTE — Consult Note (Addendum)
Cardiology Consultation:   Patient ID: Alexa Orozco MRN: [id number]; DOB: [birth year]  Admit date: 05/12/2021 Date of Consult: 05/12/2021  PCP:  Susy Frizzle, MD   [facility] Providers Cardiologist:  Glenetta Hew, MD        Patient Profile:   Alexa Orozco is a 75 y.o. female with a hx of CAD s/p PCI 2007, chronic exertional dyspnea, bladder CA, allergies, asthma, depression, dyslipidemia, emphysema, GERD, tobacco abuse, HTN, hypothyroidism, osteoporosis, Raynaud's who is being seen 05/12/2021 for the evaluation of elevated troponin at the request of Dr. Kennon Rounds.  History of Present Illness:   Alexa Orozco had remote PCI to RCA in 2007, otherwise 20% mLAD. That was her last cath, see scanned report. Echo 2010 showed normal EF, mild TR, mild MR. Her last nuc stress test was in 2014 and was normal. She was found to have bladder cancer requiring surgery earlier this year, but otherwise has not had any recent cardiac admissions.   About a week ago she developed coughing, low grade fever, and nausea. She saw her PCP 05/09/21 and was provided rx for prednisone and Zpak. Home Covid test was negative that day. Over the weekend she continued to feel crummy. This AM she woke up with persistent chest discomfort across her chest and into her jaw. She did not try anything at home for this. Due to continued symptoms along with worsening SOB she came to the ED. Covid test her is positive. She was given morphine with resolution in chest pain. However, she still feels lousy overall with some nausea and headache. Labs show mildly abnormal LFTs, hsTroponin 99-323, d-dimer negative, normal CBC. CXR shows hyperinflated lungs c/w COPD, no focal consolidation or pleural effusion. She was 92% on RA upon arrival, currently 93-95% on 4L. Primary care notes indicate she has not had her covid vaccination.   Past Medical History:  Diagnosis Date   Allergies    Allergy    Anxiety    Asthma    Bladder cancer ([place])    CAD S/P  percutaneous coronary angioplasty 10/2005   Class III Angina --> Cath: 85% -- PCI 3.40m x 1152m(4.0 mm) Cypher DES to RCA; Myoview October 2014: normal stress test: LOW RISK; mild anteroapical breast attenuation   Cataract    h/o bilat repair   [place]aud[place]   COPD (ch[place] disease) ([place]   Depression 11/29/2013   Emphysema of lung (HCC)    GERD (gastroesophageal reflux disease)    History of palpitations    History of tobacco abuse    Hyperlipidemia    Hypertension    well-controlled   Hypothyroidism    Osteoporosis    Raynaud disease     Past Surgical History:  Procedure Laterality Date   ABDOMINAL HYSTERECTOMY     h/o partila hysterectomy   BREAST BIOPSY  1988   CORONARY ANGIOPLASTY WITH STENT PLACEMENT  10/14/2005   cypher DES (3.52m63m8mm) to high grade RCA lesion   NM MYOVIEW LTD  July 2012 of October 2014   '12: dipyridamole; Normal, low risk study; 2014 - normal stress test: LOW RISK; mild anteroapical breast attenuation   TONSILLECTOMY  1959   TRANSTHORACIC ECHOCARDIOGRAM  05/22/2009   EF=>55%, normal LV systolic function; normal RV systolic function; mild MR/TR   TRANSURETHRAL RESECTION OF BLADDER TUMOR N/A 11/08/2020   Procedure: TRANSURETHRAL RESECTION OF BLADDER TUMOR (TURBT);  Surgeon: SniBilley CoD;  Location: [facility] ORS;  Service: Urology;  Laterality: N/A;   TRANSURETHRAL  RESECTION OF BLADDER TUMOR N/A 12/13/2020   Procedure: TRANSURETHRAL RESECTION OF BLADDER TUMOR (TURBT);  Surgeon: Billey Co, MD;  Location: [facility] ORS;  Service: Urology;  Laterality: N/A;     Home Medications:  Prior to Admission medications   Medication Sig Start Date End Date Taking? Authorizing Provider  acetaminophen (TYLENOL) 500 MG tablet Take 500-1,000 mg by mouth every 6 (six) hours as needed (pain).   Yes [provider]  albuterol (PROVENTIL) (2.5 MG/3ML) 0.083% nebulizer solution Take 3 mLs (2.5 mg total) by nebulization every  6 (six) hours as needed for wheezing or shortness of breath. 08/09/20  Yes Eulogio Bear, NP  albuterol (VENTOLIN HFA) 108 (90 Base) MCG/ACT inhaler Inhale 2 puffs into the lungs every 6 (six) hours as needed for wheezing or shortness of breath. Patients insurance requires CFC-free aerosol.  Please dispense only that 01/24/21  Yes Susy Frizzle, MD  azithromycin [facility]) 250 MG tablet 2 tabs poqday1, 1 tab poqday 2-5 05/09/21  Yes Susy Frizzle, MD  clopidogrel (PLAVIX) 75 MG tablet Take 1 tablet (75 mg total) by mouth daily. 01/15/21  Yes Susy Frizzle, MD  cromolyn (OPTICROM) 4 % ophthalmic solution Place 1 drop into both eyes 4 (four) times daily. 06/16/16  Yes [provider]  Eszopiclone 3 MG TABS TAKE 1 TABLET BY MOUTH IMMEDIATELY BEFORE BEDTIME Patient taking differently: Take 3 mg by mouth at bedtime. 04/24/21  Yes Susy Frizzle, MD  levothyroxine (SYNTHROID) 88 MCG tablet Take 1 tablet (88 mcg total) by mouth daily before breakfast. 01/15/21  Yes Susy Frizzle, MD  meclizine (ANTIVERT) 25 MG tablet Take 0.5 tablets (12.5 mg total) by mouth 3 (three) times daily as needed for dizziness. 02/06/21  Yes Noemi Chapel A, NP  montelukast (SINGULAIR) 10 MG tablet TAKE 1 TABLET BY MOUTH EVERYDAY AT BEDTIME 01/15/21  Yes Susy Frizzle, MD  nitroGLYCERIN (NITROSTAT) 0.4 MG SL tablet Place 1 tablet (0.4 mg total) under the tongue every 5 (five) minutes as needed for chest pain. 03/10/18  Yes Dena Billet B, PA-C  pantoprazole (PROTONIX) 40 MG tablet Take 1 tablet (40 mg total) by mouth every morning. 01/15/21  Yes Susy Frizzle, MD  pravastatin (PRAVACHOL) 20 MG tablet Take 1 tablet (20 mg total) by mouth every morning. 01/15/21  Yes Susy Frizzle, MD  predniSONE (DELTASONE) 20 MG tablet 3 tabs poqday 1-2, 2 tabs poqday 3-4, 1 tab poqday 5-6 05/09/21  Yes Pickard, Cammie Mcgee, MD  sertraline (ZOLOFT) 100 MG tablet Take 1.5 tablets (150 mg total) by mouth daily. 02/06/21   Yes Eulogio Bear, NP  Tiotropium Bromide Monohydrate (SPIRIVA RESPIMAT) 2.5 MCG/ACT AERS Inhale 2 puffs into the lungs every morning. 01/15/21  Yes Susy Frizzle, MD  clonazePAM (KLONOPIN) 0.5 MG tablet Take 0.5-1 tablets (0.25-0.5 mg total) by mouth daily as needed for anxiety. Patient not taking: No sig reported 08/09/20   Noemi Chapel A, NP  tiZANidine (ZANAFLEX) 4 MG tablet Take 1 tablet (4 mg total) by mouth every 6 (six) hours as needed for muscle spasms. Patient not taking: No sig reported 05/31/20   Susy Frizzle, MD  Trospium Chloride 60 MG CP24 Take 1 capsule (60 mg total) by mouth daily. Patient not taking: No sig reported 04/25/21   Billey Co, MD    Inpatient Medications: Scheduled Meds:   Continuous Infusions:  heparin 900 Units/hr (05/12/21 1732)   PRN Meds:   Allergies:    Allergies  Allergen Reactions   Bystolic [Nebivolol Hcl] Hives   Cephalexin Hives   Effexor [Venlafaxine] Other (See Comments)    Made her face hot and red   Levaquin [Levofloxacin In D5w] Other (See Comments)    Caused face to turn red and patient became extremely hot     Social History:   Social History   Socioeconomic History   Marital status: Widowed    Spouse name: Not on file   Number of children: 3   Years of education: 10th    Highest education level: Not on file  Occupational History   Occupation: Retired  Tobacco Use   Smoking status: Every Day    Packs/day: 0.50    Years: 35.00    Pack years: 17.50    Types: Cigarettes   Smokeless tobacco: Never   Tobacco comments:    11/11/18 still at 1/2ppd  Vaping Use   Vaping Use: Never used  Substance and Sexual Activity   Alcohol use: No   Drug use: No   Sexual activity: Not on file  Other Topics Concern   Not on file  Social History Narrative   She is a widowed mother of 33, grandmother for, great-grandmother 30.  She still smokes about half pack a day.  She doesn't have about 35 years.  She does not  drink.   Prior to her symptoms coming on, she used to do all kind of walking around and working in the garden and other activities with her close friend.   Social Determinants of Health   Financial Resource Strain: Low Risk    Difficulty of Paying Living Expenses: Not hard at all  Food Insecurity: No Food Insecurity   Worried About Charity fundraiser in the Last Year: Never true   [place] in the Last Year: Never true  Transportation Needs: No Transportation Needs   Lack of Transportation (Medical): No   Lack of Transportation (Non-Medical): No  Physical Activity: Inactive   Days of Exercise per Week: 0 days   Minutes of Exercise per Session: 0 min  Stress: No Stress Concern Present   Feeling of Stress : Not at all  Social Connections: Moderately Isolated   Frequency of Communication with Friends and Family: More than three times a week   Frequency of Social Gatherings with Friends and Family: More than three times a week   Attends Religious Services: More than 4 times per year   Active Member of [employer] or Organizations: No   Attends Archivist Meetings: Never   Marital Status: Widowed  Human resources officer Violence: Not At Risk   Fear of Current or Ex-Partner: No   Emotionally Abused: No   Physically Abused: No   Sexually Abused: No    Family History:    Family History  Problem Relation Age of Onset   Liver cancer Mother    Heart attack Mother    Heart Problems Sister    Lupus Sister    Liver cancer Brother    Asthma Brother    Breast cancer Neg Hx    Prostate cancer Neg Hx    Bladder Cancer Neg Hx    Kidney cancer Neg Hx      ROS:  Please see the history of present illness.  All other ROS reviewed and negative.     Physical Exam/Data:   Vitals:   05/12/21 1315 05/12/21 1319 05/12/21 1323 05/12/21 1400  BP: (!) 134/95   (!) 164/70  Pulse: Marland Kitchen)  55   (!) 57  Resp: (!) 22   (!) 27  Temp:   98.3 F (36.8 C)   TempSrc:   Oral   SpO2: 98%   95%   Weight:  73.4 kg    Height:  '5\' 2"'$  (1.575 m)     No intake or output data in the 24 hours ending 05/12/21 1736 Last 3 Weights 05/12/2021 05/09/2021 02/06/2021  Weight (lbs) 161 lb 14.4 oz 160 lb 162 lb  Weight (kg) 73.437 kg 72.576 kg 73.483 kg     Body mass index is 29.61 kg/m.  General: Well developed WF, in no acute distress. Sitting upright in bed Head: Normocephalic, atraumatic, sclera non-icteric, no xanthomas, nares are without discharge. Neck: Negative for carotid bruits. JVP not elevated. Lungs: Mildly diminished throughout with faint rhonchi. Breathing is unlabored. Heart: RRR S1 S2 without murmurs, rubs, or gallops.  Abdomen: Soft, non-tender, non-distended with normoactive bowel sounds. No rebound/guarding. Extremities: No clubbing or cyanosis. No edema. Distal pedal pulses are 2+ and equal bilaterally. Neuro: Alert and oriented X 3. Moves all extremities spontaneously. Psych:  Responds to questions appropriately with a normal affect.   EKG:  The EKG was personally reviewed and demonstrates:  SB 59bpm, possible RAD otherwise nonacute Telemetry:  Telemetry was personally reviewed and demonstrates:  NSR/SB 50s  Relevant CV Studies: See scanned prior echo and cath reports  NST 2014 Impression Exercise Capacity:  Lexiscan with no exercise. BP Response:  Normal blood pressure response. Clinical Symptoms:  Chest tightness & Dyspnea ECG Impression:  No significant ECG changes with Lexiscan. LV Wall Motion:  NL LV Function; NL Wall Motion   Comparison with Prior Nuclear Study: No significant change from previous study - the breast attenuation is more prominent.   Overall Impression:  Normal stress nuclear study. and Low risk stress nuclear study with mild anteroapical breast attenuation.Marland Kitchen       Leonie Man, MD   06/06/2013 5:25 PM      Laboratory Data:  High Sensitivity Troponin:   Recent Labs  Lab 05/12/21 1353 05/12/21 1535  TROPONINIHS 99* 323*      Chemistry Recent Labs  Lab 05/12/21 1353  NA 137  K 3.9  CL 105  CO2 23  GLUCOSE 137*  BUN 17  CREATININE 0.79  CALCIUM 9.0  GFRNONAA >60  ANIONGAP 9    Recent Labs  Lab 05/12/21 1353  PROT 8.1  ALBUMIN 4.2  AST 45*  ALT 50*  ALKPHOS 123  BILITOT 0.5   Hematology Recent Labs  Lab 05/12/21 1353  WBC 8.6  RBC 4.80  HGB 14.9  HCT 44.7  MCV 93.1  MCH 31.0  MCHC 33.3  RDW 15.0  PLT 378   BNPNo results for input(s): BNP, PROBNP in the last 168 hours.  DDimer  Recent Labs  Lab 05/12/21 1353  DDIMER 0.42     Radiology/Studies:  DG Chest Port 1 View  Result Date: 05/12/2021 CLINICAL DATA:  Shortness of breath EXAM: PORTABLE CHEST 1 VIEW COMPARISON:  Chest radiograph 08/29/2018 FINDINGS: The cardiomediastinal silhouette is normal. The lungs are hyperinflated consistent with COPD. There is no focal consolidation. There is no pulmonary edema. There is no pleural effusion or pneumothorax. There is biapical pleural scarring, unchanged. There is no acute osseous abnormality. IMPRESSION: Hyperinflated lungs consistent with COPD. No focal consolidation or pleural effusion. Electronically Signed   By: Valetta Mole M.D.   On: 05/12/2021 14:46     Assessment and Plan:   1.  Chest pain/elevated troponin - unclear if this represents NSTEMI or demand ischemia related to covid illness - would continue to trend troponins - heparin per pharmacy - ASA '324mg'$  x1 now then '81mg'$  daily - would continue home Plavix on admission when medications reconciled - avoid BB due to baseline bradycardia - hold off statin titration given abnormal LFTs - check echocardiogram - d-dimer negative - see below for additional MD thoughts  2. Covid infection - she has felt poorly x 1 week, currently requiring 4L to maintain sats 93-95% - further management per medicine team  3. CAD s/p prior PCI - see above  4. Essential HTN  - hypertensive on arrival, would manage in context of above  5.  Hyperlipidemia  - LFTs slightly abnormal on admission, would defer statin titration at this time    Risk Assessment/Risk Scores:     TIMI Risk Score for Unstable Angina or Non-ST Elevation MI:   The patient's TIMI risk score is 5, which indicates a 26% risk of all cause mortality, new or recurrent myocardial infarction or need for urgent revascularization in the next 14 days.   For questions or updates, please contact [place] Please consult [email or web address] for contact info under    Signed, Charlie Pitter, PA-C  05/12/2021 5:36 PM    Attending note:  I reviewed records and discussed the case with Ms. Idolina Primer PA-C who directly examined the patient under isolation for COVID-19.  I agree with her above assessment.  Alexa Orozco is a patient of Dr. Ellyn Hack with history of CAD status post PCI to the RCA in 2007 with otherwise mild nonobstructive disease.  Last formal ischemic testing was in 2014.  She presents to the [place] with recent URI symptoms, felt worse over the weekend, also experiencing chest discomfort this morning radiating into her jaw.  She is found to be COVID-19 positive by testing in the ER today, chest x-ray showing hyperinflation consistent with COPD but without acute infiltrates.  She does have an oxygen requirement at this time of 4 L via nasal cannula.  Chest discomfort better with morphine.  ECG shows no acute ST segment changes but high-sensitivity troponin I levels have increased from 99-323.  She is otherwise hemodynamically stable and is being admitted to the hospitalist team for further management.  Currently afebrile, heart rate in the 60s to 70s in sinus rhythm by telemetry which I personally reviewed.  Systolic blood pressure 0000000 to 170s.  No active wheezing reported, no pericardial rub reported.  Pertinent lab work includes potassium 3.9, BUN 17, creatinine 0.79, AST 45, ALT 50, WBC 8.6, hemoglobin 14.9, platelets 378, D-dimer 0.42.  Recent chest discomfort and  mildly abnormal high-sensitivity troponin I, possibility of evolving ACS not excluded, however ECG shows no acute ST segment changes, and she is also COVID-19 positive with oxygen requirement but no acute infiltrates so inflammatory process such as pericarditis or myocarditis is also to be considered.  D-dimer negative.  She is currently hemodynamically stable.  Agree with admission for further management, starting IV heparin in the ER, also initiate low-dose aspirin in addition to her Plavix.  Holding off on beta-blocker at this point given heart rate in the 60s.  Check an echocardiogram tomorrow and continue to cycle cardiac enzymes.  We will continue to follow with you  Satira Sark, M.D., F.A.C.C.

## 2021-05-12 NOTE — Progress Notes (Signed)
ANTICOAGULATION CONSULT NOTE - Initial Consult  Pharmacy Consult for Heparin Indication: chest pain/ACS  Allergies  Allergen Reactions   Bystolic [Nebivolol Hcl] Hives   Cephalexin Hives   Effexor [Venlafaxine] Other (See Comments)    Made her face hot and red   Levaquin [Levofloxacin In D5w] Other (See Comments)    Caused face to turn red and patient became extremely hot     Patient Measurements: Height: '5\' 2"'$  (157.5 cm) Weight: 73.4 kg (161 lb 14.4 oz) IBW/kg (Calculated) : 50.1 HEPARIN DW (KG): 65.9  Vital Signs: Temp: 98.3 F (36.8 C) (09/12 1323) Temp Source: Oral (09/12 1323) BP: 164/70 (09/12 1400) Pulse Rate: 57 (09/12 1400)  Labs: Recent Labs    05/12/21 1353 05/12/21 1535  HGB 14.9  --   HCT 44.7  --   PLT 378  --   CREATININE 0.79  --   TROPONINIHS 99* 323*    Estimated Creatinine Clearance: 58.7 mL/min (by C-G formula based on SCr of 0.79 mg/dL).   Medical History: Past Medical History:  Diagnosis Date   Allergies    Allergy    Angina pectoris, unspecified ([place])    Anxiety    Asthma    CAD S/P percutaneous coronary angioplasty February 2007   Class III Angina --> Cath: 85% -- PCI 3.29m x 138m(4.0 mm) Cypher DES to RCA; Myoview October 2014: normal stress test: LOW RISK; mild anteroapical breast attenuation   Cataract    h/o bilat repair   [place]   [place]D [place] disease) [place] [place]   Depression 11/29/2013   Emphysema of lung (HCC)    GERD (gastroesophageal reflux disease)    History of palpitations    History of tobacco abuse    Hyperlipidemia    Hypertension    well-controlled   Hypothyroidism    Osteoporosis    Raynaud disease     Medications:  (Not in a hospital admission)   Assessment: No anticoagulants PTA per med list.  Asked to initiate Heparin infusion for ACS.   Goal of Therapy:  Heparin level 0.3-0.7 units/ml Monitor platelets by anticoagulation protocol: Yes   Plan:  Heparin  bolus 4000 units x 1 then infusion at 900 units/hr Check HL in 6 hours Monitor for s/sx bleeding, H/H  HaHart Robinsons 05/12/2021,5:04 PM

## 2021-05-12 NOTE — ED Notes (Signed)
[facility] arrived to transport pt 

## 2021-05-12 NOTE — ED Notes (Signed)
Date and time results received: 05/12/21 9:25 PM   Test: Troponin  Critical Value: 905  Name of Provider Notified: Dr. Kennon Rounds  Orders Received? Or Actions Taken?:

## 2021-05-13 ENCOUNTER — Inpatient Hospital Stay (HOSPITAL_COMMUNITY): Payer: Medicare Other

## 2021-05-13 ENCOUNTER — Encounter (HOSPITAL_COMMUNITY): Payer: Self-pay | Admitting: Family Medicine

## 2021-05-13 DIAGNOSIS — Z9861 Coronary angioplasty status: Secondary | ICD-10-CM | POA: Diagnosis not present

## 2021-05-13 DIAGNOSIS — I214 Non-ST elevation (NSTEMI) myocardial infarction: Secondary | ICD-10-CM

## 2021-05-13 DIAGNOSIS — I2510 Atherosclerotic heart disease of native coronary artery without angina pectoris: Secondary | ICD-10-CM | POA: Diagnosis not present

## 2021-05-13 LAB — EXPECTORATED SPUTUM ASSESSMENT W GRAM STAIN, RFLX TO RESP C

## 2021-05-13 LAB — ECHOCARDIOGRAM COMPLETE
AR max vel: 2.04 cm2
AV Area VTI: 2.18 cm2
AV Area mean vel: 2.2 cm2
AV Mean grad: 3 mmHg
AV Peak grad: 8.6 mmHg
Ao pk vel: 1.47 m/s
Area-P 1/2: 3.87 cm2
Calc EF: 66.1 %
Height: 62 in
MV VTI: 1.99 cm2
S' Lateral: 2.8 cm
Single Plane A2C EF: 71.1 %
Single Plane A4C EF: 59.3 %
Weight: 2546.75 oz

## 2021-05-13 LAB — TROPONIN I (HIGH SENSITIVITY): Troponin I (High Sensitivity): 588 ng/L (ref <18)

## 2021-05-13 LAB — LIPID PANEL
Cholesterol: 204 mg/dL — ABNORMAL HIGH (ref 0–200)
HDL: 75 mg/dL (ref >40)
LDL Cholesterol: 110 mg/dL — ABNORMAL HIGH (ref 0–99)
Total CHOL/HDL Ratio: 2.7 RATIO
Triglycerides: 97 mg/dL (ref <150)
VLDL: 19 mg/dL (ref 0–40)

## 2021-05-13 LAB — MRSA NEXT GEN BY PCR, NASAL: MRSA by PCR Next Gen: DETECTED — AB

## 2021-05-13 LAB — HEPARIN LEVEL (UNFRACTIONATED): Heparin Unfractionated: 0.67 IU/mL (ref 0.30–0.70)

## 2021-05-13 MED ORDER — INFLUENZA VAC A&B SA ADJ QUAD 0.5 ML IM PRSY
0.5000 mL | PREFILLED_SYRINGE | INTRAMUSCULAR | Status: AC | PRN
  Administered 2021-05-16: 0.5 mL via INTRAMUSCULAR
  Filled 2021-05-13: qty 0.5

## 2021-05-13 MED ORDER — IPRATROPIUM-ALBUTEROL 20-100 MCG/ACT IN AERS
1.0000 | INHALATION_SPRAY | Freq: Four times a day (QID) | RESPIRATORY_TRACT | Status: DC
  Administered 2021-05-13: 1 via RESPIRATORY_TRACT
  Filled 2021-05-13: qty 4

## 2021-05-13 MED ORDER — LINEZOLID 600 MG/300ML IV SOLN: 600.0000 mg | Freq: Two times a day (BID) | INTRAVENOUS | Status: DC

## 2021-05-13 MED ORDER — SODIUM CHLORIDE 0.9 % IV SOLN: 100.0000 mg | Freq: Once | INTRAVENOUS | Status: DC

## 2021-05-13 MED ORDER — VITAMIN D 25 MCG (1000 UNIT) PO TABS
1000.0000 [IU] | ORAL_TABLET | Freq: Every day | ORAL | Status: DC
  Administered 2021-05-13 – 2021-05-16 (×4): 1000 [IU] via ORAL
  Filled 2021-05-13 (×4): qty 1

## 2021-05-13 MED ORDER — ZINC SULFATE 220 (50 ZN) MG PO CAPS
220.0000 mg | ORAL_CAPSULE | Freq: Every day | ORAL | Status: DC
  Administered 2021-05-13 – 2021-05-16 (×4): 220 mg via ORAL
  Filled 2021-05-13 (×4): qty 1

## 2021-05-13 MED ORDER — MUPIROCIN 2 % EX OINT
1.0000 "application " | TOPICAL_OINTMENT | Freq: Two times a day (BID) | CUTANEOUS | Status: DC
  Administered 2021-05-13 – 2021-05-16 (×7): 1 via NASAL
  Filled 2021-05-13 (×2): qty 22

## 2021-05-13 MED ORDER — PIPERACILLIN-TAZOBACTAM 3.375 G IVPB
3.3750 g | Freq: Three times a day (TID) | INTRAVENOUS | Status: DC
  Administered 2021-05-13 – 2021-05-16 (×9): 3.375 g via INTRAVENOUS
  Filled 2021-05-13 (×11): qty 50

## 2021-05-13 MED ORDER — SODIUM CHLORIDE 0.9 % IV SOLN
200.0000 mg | Freq: Once | INTRAVENOUS | Status: AC
  Administered 2021-05-13: 200 mg via INTRAVENOUS
  Filled 2021-05-13: qty 40

## 2021-05-13 MED ORDER — VITAMIN C 250 MG PO TABS
250.0000 mg | ORAL_TABLET | Freq: Every day | ORAL | Status: DC
  Administered 2021-05-13 – 2021-05-16 (×4): 250 mg via ORAL
  Filled 2021-05-13 (×4): qty 1

## 2021-05-13 MED ORDER — PIPERACILLIN-TAZOBACTAM 3.375 G IVPB 30 MIN
3.3750 g | Freq: Once | INTRAVENOUS | Status: AC
  Administered 2021-05-13: 3.375 g via INTRAVENOUS
  Filled 2021-05-13 (×2): qty 50

## 2021-05-13 MED ORDER — LACTATED RINGERS IV SOLN: INTRAVENOUS | Status: DC

## 2021-05-13 MED ORDER — CHLORHEXIDINE GLUCONATE CLOTH 2 % EX PADS
6.0000 | MEDICATED_PAD | Freq: Every day | CUTANEOUS | Status: DC
  Administered 2021-05-13 – 2021-05-16 (×4): 6 via TOPICAL

## 2021-05-13 MED ORDER — IPRATROPIUM-ALBUTEROL 20-100 MCG/ACT IN AERS
1.0000 | INHALATION_SPRAY | Freq: Three times a day (TID) | RESPIRATORY_TRACT | Status: DC
  Administered 2021-05-14 – 2021-05-16 (×7): 1 via RESPIRATORY_TRACT
  Filled 2021-05-13: qty 4

## 2021-05-13 MED ORDER — METHYLPREDNISOLONE SODIUM SUCC 40 MG IJ SOLR
40.0000 mg | Freq: Two times a day (BID) | INTRAMUSCULAR | Status: DC
  Administered 2021-05-13 – 2021-05-16 (×7): 40 mg via INTRAVENOUS
  Filled 2021-05-13 (×7): qty 1

## 2021-05-13 MED ORDER — SODIUM CHLORIDE 0.9 % IV SOLN
100.0000 mg | Freq: Every day | INTRAVENOUS | Status: DC
  Administered 2021-05-14 – 2021-05-16 (×3): 100 mg via INTRAVENOUS
  Filled 2021-05-13 (×4): qty 20

## 2021-05-13 MED ORDER — ORAL CARE MOUTH RINSE
15.0000 mL | Freq: Two times a day (BID) | OROMUCOSAL | Status: DC
  Administered 2021-05-13 – 2021-05-15 (×6): 15 mL via OROMUCOSAL

## 2021-05-13 MED ORDER — VANCOMYCIN HCL IN DEXTROSE 1-5 GM/200ML-% IV SOLN
1000.0000 mg | INTRAVENOUS | Status: DC
  Administered 2021-05-13: 1000 mg via INTRAVENOUS
  Filled 2021-05-13 (×3): qty 200

## 2021-05-13 MED ORDER — HYDRALAZINE HCL 20 MG/ML IJ SOLN
5.0000 mg | Freq: Four times a day (QID) | INTRAMUSCULAR | Status: DC | PRN
  Filled 2021-05-13: qty 1

## 2021-05-13 NOTE — Progress Notes (Signed)
[place] for Heparin Indication: chest pain/ACS  Allergies  Allergen Reactions   Bystolic [Nebivolol Hcl] Hives   Cephalexin Hives   Effexor [Venlafaxine] Other (See Comments)    Made her face hot and red   Levaquin [Levofloxacin In D5w] Other (See Comments)    Caused face to turn red and patient became extremely hot     Patient Measurements: Height: '5\' 2"'$  (157.5 cm) Weight: 73.4 kg (161 lb 14.4 oz) IBW/kg (Calculated) : 50.1 HEPARIN DW (KG): 65.9  Vital Signs: Temp: 97.7 F (36.5 C) (09/13 0037) Temp Source: Oral (09/13 0037) BP: 164/82 (09/13 0037) Pulse Rate: 57 (09/13 0037)  Labs: Recent Labs    05/12/21 1353 05/12/21 1535 05/12/21 1802 05/12/21 2019 05/12/21 2329  HGB 14.9  --   --   --   --   HCT 44.7  --   --   --   --   PLT 378  --   --   --   --   APTT 27  --   --   --   --   LABPROT 12.1  --   --   --   --   INR 0.9  --   --   --   --   HEPARINUNFRC  --   --   --   --  0.90*  CREATININE 0.79  --   --   --   --   TROPONINIHS 99* 323* 749* 905*  --      Estimated Creatinine Clearance: 58.7 mL/min (by C-G formula based on SCr of 0.79 mg/dL).   Assessment: 74 y.o. female with chest pain for heparin   Goal of Therapy:  Heparin level 0.3-0.7 units/ml Monitor platelets by anticoagulation protocol: Yes   Plan:  Decrease Heparin 750 units/hr Check heparin level in 8 hours.   Caryl Pina 05/13/2021,12:58 AM

## 2021-05-13 NOTE — Progress Notes (Signed)
PROGRESS NOTE  Alexa Orozco [id number] DOB: Jul 16, 1946 DOA: 05/12/2021 PCP: Susy Frizzle, MD  HPI/Recap of past 24 hours: Alexa Orozco is a 75 y.o. female with medical history significant of COPD, CAD, GERD, hypothyroid, bladder cancer, PVD, HTN, ongoing tobacco use who developed fever, cough, and congestion approximately 1 week ago.  Patient was seen by her primary care physician on Friday and started on prednisone taper as well as Z-Pak for presumed COPD exacerbation.  Patient reports a negative COVID test at home on Friday and on Sunday.  On the day of her presentation she reports waking up with substernal chest pressure radiating to her neck.  Associated with diaphoresis and dyspnea.  The pain is worse with activity and improved with rest.  In the ED she was hypoxic with O2 saturation in the 80s, she was placed on 4 L nasal cannula.  Normal oxygen supplementation at baseline.  Troponins were elevated and peaked at 749.  Cardiology was consulted.  Patient was started on heparin drip.  She received nitroglycerin for chest pain and a full dose of aspirin.  She was continued on her home Plavix.  Beta-blockers were held due to bradycardia and for history of hives on Bystolic.  A COVID-19 screening test came back positive.  No focal pneumonia on chest x-ray.  BNP 76.  D-dimer negative.  Patient was admitted for elevated troponin and hypoxia in the setting of COVID-19 viral infection.  05/13/2021: Patient was seen and examined at her bedside.  She reports a productive cough.  Conversational dyspnea noted.  Denies chest pain.  Started IV steroids Solu-Medrol 40 mg twice daily for mild wheezing on exam.  Also added IV vancomycin and Zosyn for suspected superimposed bacterial pulmonary infection.  Will obtain procalcitonin level in the morning.  Assessment/Plan: Principal Problem:   NSTEMI (non-ST elevated myocardial infarction) ([place]) Active Problems:   Essential hypertension   Hypothyroid   COPD  (chronic obstructive pulmonary disease) (HCC)   Tobacco abuse   CAD S/P percutaneous coronary angioplasty   Dyslipidemia   Claudication of left lower extremity (HCC)   Depression   Generalized anxiety disorder   Bladder cancer ([place])   COVID-19 virus infection   Acute on chronic respiratory failure with hypoxia (HCC)  Elevated troponin, suspect demand ischemia in the setting of COVID-19 viral infection. Troponin peaked at 905 and down trended. Currently on heparin drip, received a full dose aspirin. Seen by cardiology 2D echo results are pending. LDL 110, on home pravastatin 20 mg BH each morning Denies chest pain this morning. Management per cardiology  Coronary artery disease status post PCI with stenting Denies any anginal symptoms at the time of this visit. On aspirin, Plavix, pravastatin Continue to monitor on telemetry  Acute hypoxic respiratory failure secondary to COVID-19 viral infection, suspect superimposed by early bacterial pulmonary infection. She is not on oxygen supplementation at baseline Currently on 4 L to maintain O2 saturation greater than 92% Incentive spirometer Started IV Solu-Medrol 40 mg twice daily. MRSA screening positive. Obtain sputum culture Start IV vancomycin and IV Zosyn Start remdesivir x5 days Scheduled bronchodilators Vitamin C, D3, zinc  Essential hypertension, not at goal BP elevated, suspect contributed by IV steroids and IV fluid On enalapril 2.5 mg daily, hydralazine 50 mg every 6 hours Reduced dose of IV fluid down to 30 cc/h x 1 day. Encourage oral intake instead of IV fluid unless required.  Chronic anxiety/depression Resume home regimen on Zoloft.  Generalized weakness PT OT to assess Fall  precautions Mobilize as tolerated    Code Status: Full code  Family Communication: None at bedside  Disposition Plan: Likely to home on 05/16/2021 or when cardiology signs of.   Consultants: Cardiology  Procedures: 2D  echo  Antimicrobials: IV vancomycin and Zosyn.  DVT prophylaxis: Subcu Lovenox daily  Status is: Inpatient    Dispo: The patient is from: Home               Anticipated d/c is to:  home              Patient currently not stable for discharge.     Difficult to place patient, not applicable.          Objective: Vitals:   05/13/21 0100 05/13/21 0300 05/13/21 0500 05/13/21 0800  BP: (!) 144/76 (!) 159/84 (!) 146/82 (!) 176/92  Pulse: (!) 57 61 (!) 53 (!) 55  Resp: (!) '22 19 14 13  '$ Temp: 97.7 F (36.5 C)   97.8 F (36.6 C)  TempSrc: Oral   Oral  SpO2: 97% 98% 97% 99%  Weight:      Height:        Intake/Output Summary (Last 24 hours) at 05/13/2021 1419 Last data filed at 05/13/2021 1200 Gross per 24 hour  Intake 320.37 ml  Output --  Net 320.37 ml   Filed Weights   05/12/21 1319 05/13/21 0037  Weight: 73.4 kg 72.2 kg    Exam:  General: 75 y.o. year-old female well developed well nourished in no acute distress.  Alert and oriented x3. Cardiovascular: Regular rate and rhythm with no rubs or gallops.  No thyromegaly or JVD noted.   Respiratory: Mild wheezing diffusely. Good inspiratory effort. Abdomen: Soft nontender nondistended with normal bowel sounds x4 quadrants. Musculoskeletal: No lower extremity edema. 2/4 pulses in all 4 extremities. Skin: No ulcerative lesions noted or rashes, Psychiatry: Mood is appropriate for condition and setting   Data Reviewed: CBC: Recent Labs  Lab 05/12/21 1353  WBC 8.6  NEUTROABS 7.3  HGB 14.9  HCT 44.7  MCV 93.1  PLT [id number]   Basic Metabolic Panel: Recent Labs  Lab 05/12/21 1353  NA 137  K 3.9  CL 105  CO2 23  GLUCOSE 137*  BUN 17  CREATININE 0.79  CALCIUM 9.0   GFR: Estimated Creatinine Clearance: 58.2 mL/min (by C-G formula based on SCr of 0.79 mg/dL). Liver Function Tests: Recent Labs  Lab 05/12/21 1353  AST 45*  ALT 50*  ALKPHOS 123  BILITOT 0.5  PROT 8.1  ALBUMIN 4.2   No results for  input(s): LIPASE, AMYLASE in the last 168 hours. No results for input(s): AMMONIA in the last 168 hours. Coagulation Profile: Recent Labs  Lab 05/12/21 1353  INR 0.9   Cardiac Enzymes: No results for input(s): CKTOTAL, CKMB, CKMBINDEX, TROPONINI in the last 168 hours. BNP (last 3 results) No results for input(s): PROBNP in the last 8760 hours. HbA1C: No results for input(s): HGBA1C in the last 72 hours. CBG: No results for input(s): GLUCAP in the last 168 hours. Lipid Profile: Recent Labs    05/13/21 0553  CHOL 204*  HDL 75  LDLCALC 110*  TRIG 97  CHOLHDL 2.7   Thyroid Function Tests: No results for input(s): TSH, T4TOTAL, FREET4, T3FREE, THYROIDAB in the last 72 hours. Anemia Panel: No results for input(s): VITAMINB12, FOLATE, FERRITIN, TIBC, IRON, RETICCTPCT in the last 72 hours. Urine analysis:    Component Value Date/Time   COLORURINE DARK YELLOW 12/23/2020 1136  APPEARANCEUR Clear 03/28/2021 1441   LABSPEC 1.025 12/23/2020 1136   PHURINE 6.5 12/23/2020 1136   GLUCOSEU Negative 03/28/2021 1441   HGBUR 3+ (A) 12/23/2020 1136   BILIRUBINUR Negative 03/28/2021 1441   KETONESUR NEGATIVE 12/23/2020 1136   PROTEINUR Negative 03/28/2021 1441   PROTEINUR 2+ (A) 12/23/2020 1136   NITRITE Negative 03/28/2021 1441   NITRITE POSITIVE (A) 12/23/2020 1136   LEUKOCYTESUR Negative 03/28/2021 1441   LEUKOCYTESUR 3+ (A) 12/23/2020 1136   Sepsis Labs: '@LABRCNTIP'$ (procalcitonin:4,lacticidven:4)  ) Recent Results (from the past 240 hour(s))  Resp Panel by RT-PCR (Flu A&B, Covid) Nasopharyngeal Swab     Status: Abnormal   Collection Time: 05/12/21  1:46 PM   Specimen: Nasopharyngeal Swab; Nasopharyngeal(NP) swabs in vial transport medium  Result Value Ref Range Status   SARS Coronavirus 2 by RT PCR POSITIVE (A) NEGATIVE Final    Comment: CRITICAL RESULT CALLED TO, READ BACK BY AND VERIFIED WITH: CRAWFORD,H AT 1538 ON 9.12.22 BY RUCINSKI,B (NOTE) SARS-CoV-2 target nucleic  acids are DETECTED.  The SARS-CoV-2 RNA is generally detectable in upper respiratory specimens during the acute phase of infection. Positive results are indicative of the presence of the identified virus, but do not rule out bacterial infection or co-infection with other pathogens not detected by the test. Clinical correlation with patient history and other diagnostic information is necessary to determine patient infection status. The expected result is Negative.  Fact Sheet for Patients: EntrepreneurPulse.com.au  Fact Sheet for Healthcare Providers: IncredibleEmployment.be  This test is not yet approved or cleared by the [place] FDA and  has been authorized for detection and/or diagnosis of SARS-CoV-2 by FDA under an Emergency Use Authorization (EUA).  This EUA will remain in effect (meani ng this test can be used) for the duration of  the COVID-19 declaration under Section 564(b)(1) of the Act, 21 U.S.C. section 360bbb-3(b)(1), unless the authorization is terminated or revoked sooner.     Influenza A by PCR NEGATIVE NEGATIVE Final   Influenza B by PCR NEGATIVE NEGATIVE Final    Comment: (NOTE) The Xpert Xpress SARS-CoV-2/FLU/RSV plus assay is intended as an aid in the diagnosis of influenza from Nasopharyngeal swab specimens and should not be used as a sole basis for treatment. Nasal washings and aspirates are unacceptable for Xpert Xpress SARS-CoV-2/FLU/RSV testing.  Fact Sheet for Patients: EntrepreneurPulse.com.au  Fact Sheet for Healthcare Providers: IncredibleEmployment.be  This test is not yet approved or cleared by the [place] FDA and has been authorized for detection and/or diagnosis of SARS-CoV-2 by FDA under an Emergency Use Authorization (EUA). This EUA will remain in effect (meaning this test can be used) for the duration of the COVID-19 declaration under Section 564(b)(1) of  the Act, 21 U.S.C. section 360bbb-3(b)(1), unless the authorization is terminated or revoked.  Performed at [facility], [address], [place], [place] [address]   MRSA Next Gen by PCR, Nasal     Status: Abnormal   Collection Time: 05/13/21  1:00 AM   Specimen: Nasal Mucosa; Nasal Swab  Result Value Ref Range Status   MRSA by PCR Next Gen DETECTED (A) NOT DETECTED Final    Comment: RESULT CALLED TO, READ BACK BY AND VERIFIED WITH: RN JESSICA WOLFE 05/13/21'@2'$ :17 BY TW (NOTE) The GeneXpert MRSA Assay (FDA approved for NASAL specimens only), is one component of a comprehensive MRSA colonization surveillance program. It is not intended to diagnose MRSA infection nor to guide or monitor treatment for MRSA infections. Test performance is not FDA approved in  patients less than 61 years old. Performed at [facility], [place] [address], [place], [place] [address]   Expectorated Sputum Assessment w Gram Stain, Rflx to Resp Cult     Status: None   Collection Time: 05/13/21 10:00 AM   Specimen: Expectorated Sputum  Result Value Ref Range Status   Specimen Description EXPECTORATED SPUTUM  Final   Special Requests Immunocompromised  Final   Sputum evaluation   Final    THIS SPECIMEN IS ACCEPTABLE FOR SPUTUM CULTURE Performed at [facility], [place] [address] [place], [place] [address]    Report Status 05/13/2021 FINAL  Final  Culture, Respiratory w Gram Stain     Status: None (Preliminary result)   Collection Time: 05/13/21 10:00 AM  Result Value Ref Range Status   Specimen Description EXPECTORATED SPUTUM  Final   Special Requests Immunocompromised Reflexed from [id number]  Final   Gram Stain   Final    ABUNDANT WBC PRESENT, PREDOMINANTLY PMN RARE GRAM POSITIVE COCCI IN PAIRS RARE GRAM NEGATIVE RODS Performed at [facility], [place] [address], [place], [place] [address]    Culture PENDING  Incomplete   Report Status PENDING  Incomplete      Studies: DG CHEST PORT 1  VIEW  Result Date: 05/13/2021 CLINICAL DATA:  Hypoxia.  COVID positive EXAM: PORTABLE CHEST 1 VIEW COMPARISON:  Yesterday FINDINGS: No air bronchogram, edema, effusion, or pneumothorax. Evidence of small hiatal hernia. Normal heart size and mediastinal contours. IMPRESSION: No focal pneumonia or other change from yesterday. Electronically Signed   By: Jorje Guild M.D.   On: 05/13/2021 09:58   DG Chest Port 1 View  Result Date: 05/12/2021 CLINICAL DATA:  Shortness of breath EXAM: PORTABLE CHEST 1 VIEW COMPARISON:  Chest radiograph 08/29/2018 FINDINGS: The cardiomediastinal silhouette is normal. The lungs are hyperinflated consistent with COPD. There is no focal consolidation. There is no pulmonary edema. There is no pleural effusion or pneumothorax. There is biapical pleural scarring, unchanged. There is no acute osseous abnormality. IMPRESSION: Hyperinflated lungs consistent with COPD. No focal consolidation or pleural effusion. Electronically Signed   By: Valetta Mole M.D.   On: 05/12/2021 14:46   ECHOCARDIOGRAM COMPLETE  Result Date: 05/13/2021    ECHOCARDIOGRAM REPORT   Patient Name:   TOLA RIVEST  Date of Exam: 05/13/2021 Medical Rec #:  [id number]  Height:       62.0 in Accession #:    [id number] Weight:       159.2 lb Date of Birth:  May 09, 1946   BSA:          1.735 m Patient Age:    39 years   BP:           176/92 mmHg Patient Gender: F          HR:           55 bpm. Exam Location:  [facility] Procedure: 2D Echo, Cardiac Doppler and Color Doppler Indications:    NSTEMI  History:        Patient has prior history of Echocardiogram examinations, most                 recent 05/22/2009. CAD, COPD; Risk Factors:Hypertension, Current                 Smoker and Dyslipidemia. COVID +.  Sonographer:    Wenda Low Referring Phys: [facility]  1. Left ventricular ejection fraction, by estimation, is 60 to  65%. The left ventricle has normal function. The left ventricle has no regional wall  motion abnormalities. Left ventricular diastolic parameters are consistent with Grade I diastolic dysfunction (impaired relaxation).  2. Right ventricular systolic function is normal. The right ventricular size is normal. There is normal pulmonary artery systolic pressure.  3. The mitral valve is normal in structure. Trivial mitral valve regurgitation. No evidence of mitral stenosis.  4. The aortic valve is tricuspid. Aortic valve regurgitation is not visualized. No aortic stenosis is present.  5. The inferior vena cava is normal in size with greater than 50% respiratory variability, suggesting right atrial pressure of 3 mmHg. FINDINGS  Left Ventricle: Left ventricular ejection fraction, by estimation, is 60 to 65%. The left ventricle has normal function. The left ventricle has no regional wall motion abnormalities. The left ventricular internal cavity size was normal in size. There is  no left ventricular hypertrophy. Left ventricular diastolic parameters are consistent with Grade I diastolic dysfunction (impaired relaxation). Indeterminate filling pressures. Right Ventricle: The right ventricular size is normal. No increase in right ventricular wall thickness. Right ventricular systolic function is normal. There is normal pulmonary artery systolic pressure. The tricuspid regurgitant velocity is 1.62 m/s, and  with an assumed right atrial pressure of 3 mmHg, the estimated right ventricular systolic pressure is [id number] mmHg. Left Atrium: Left atrial size was normal in size. Right Atrium: Right atrial size was normal in size. Pericardium: There is no evidence of pericardial effusion. Mitral Valve: The mitral valve is normal in structure. Trivial mitral valve regurgitation. No evidence of mitral valve stenosis. MV peak gradient, 3.9 mmHg. The mean mitral valve gradient is 1.0 mmHg. Tricuspid Valve: The tricuspid valve is normal in structure. Tricuspid valve regurgitation is trivial. No evidence of tricuspid stenosis.  Aortic Valve: The aortic valve is tricuspid. Aortic valve regurgitation is not visualized. No aortic stenosis is present. Aortic valve mean gradient measures 3.0 mmHg. Aortic valve peak gradient measures 8.6 mmHg. Aortic valve area, by VTI measures 2.18 cm. Pulmonic Valve: The pulmonic valve was normal in structure. Pulmonic valve regurgitation is not visualized. No evidence of pulmonic stenosis. Aorta: The aortic root is normal in size and structure. Venous: The inferior vena cava is normal in size with greater than 50% respiratory variability, suggesting right atrial pressure of 3 mmHg. IAS/Shunts: No atrial level shunt detected by color flow Doppler.  LEFT VENTRICLE PLAX 2D LVIDd:         4.60 cm     Diastology LVIDs:         2.80 cm     LV e' medial:    7.62 cm/s LV PW:         1.10 cm     LV E/e' medial:  7.0 LV IVS:        1.00 cm     LV e' lateral:   9.25 cm/s LVOT diam:     1.90 cm     LV E/e' lateral: 5.7 LV SV:         69 LV SV Index:   40 LVOT Area:     2.84 cm  LV Volumes (MOD) LV vol d, MOD A2C: 70.3 ml LV vol d, MOD A4C: 67.3 ml LV vol s, MOD A2C: 20.3 ml LV vol s, MOD A4C: 27.4 ml LV SV MOD A2C:     50.0 ml LV SV MOD A4C:     67.3 ml LV SV MOD BP:      46.6 ml RIGHT VENTRICLE  RV Basal diam:  2.85 cm RV Mid diam:    2.30 cm RV S prime:     13.60 cm/s TAPSE (M-mode): 3.1 cm LEFT ATRIUM             Index       RIGHT ATRIUM           Index LA diam:        3.40 cm 1.96 cm/m  RA Area:     14.20 cm LA Vol (A2C):   48.9 ml 28.19 ml/m RA Volume:   37.50 ml  21.62 ml/m LA Vol (A4C):   42.6 ml 24.55 ml/m LA Biplane Vol: 45.7 ml 26.34 ml/m  AORTIC VALVE AV Area (Vmax):    2.04 cm AV Area (Vmean):   2.20 cm AV Area (VTI):     2.18 cm AV Vmax:           147.00 cm/s AV Vmean:          85.400 cm/s AV VTI:            0.318 m AV Peak Grad:      8.6 mmHg AV Mean Grad:      3.0 mmHg LVOT Vmax:         106.00 cm/s LVOT Vmean:        66.300 cm/s LVOT VTI:          0.245 m LVOT/AV VTI ratio: 0.77  AORTA Ao Root  diam: 3.00 cm MITRAL VALVE               TRICUSPID VALVE MV Area (PHT): 3.87 cm    TR Peak grad:   10.5 mmHg MV Area VTI:   1.99 cm    TR Vmax:        161.80 cm/s MV Peak grad:  3.9 mmHg MV Mean grad:  1.0 mmHg    SHUNTS MV Vmax:       0.99 m/s    Systemic VTI:  0.24 m MV Vmean:      53.1 cm/s   Systemic Diam: 1.90 cm MV Decel Time: 196 msec MV E velocity: 52.98 cm/s MV A velocity: 81.40 cm/s MV E/A ratio:  0.65 Skeet Latch MD Electronically signed by Skeet Latch MD Signature Date/Time: 05/13/2021/2:08:39 PM    Final     Scheduled Meds:  aspirin EC  81 mg Oral Daily   Chlorhexidine Gluconate Cloth  6 each Topical Q0600   clopidogrel  75 mg Oral Daily   enalapril  2.5 mg Oral Daily   hydrALAZINE  50 mg Oral Q6H   levothyroxine  88 mcg Oral QAC breakfast   mouth rinse  15 mL Mouth Rinse BID   methylPREDNISolone (SOLU-MEDROL) injection  40 mg Intravenous Q12H   montelukast  10 mg Oral QHS   mupirocin ointment  1 application Nasal BID   pantoprazole  40 mg Oral BH-q7a   pravastatin  20 mg Oral BH-q7a   sertraline  150 mg Oral Daily   umeclidinium bromide  2 puff Inhalation BH-q7a    Continuous Infusions:  heparin 750 Units/hr (05/13/21 0106)   lactated ringers     piperacillin-tazobactam 3.375 g (05/13/21 1404)   piperacillin-tazobactam (ZOSYN)  IV     vancomycin       LOS: 1 day     Kayleen Memos, MD [facility] Pager [phone]  If 7PM-7AM, please contact night-coverage [email or web address] Password [facility] 05/13/2021, 2:19 PM

## 2021-05-13 NOTE — Progress Notes (Signed)
Pharmacy Antibiotic Note  Alexa Orozco is a 75 y.o. female admitted on 05/12/2021 with concern of pneumonia.  Pharmacy has been consulted for vancomycin and zosyn dosing  -WBC= 8.6, afebrile, CrCl ~ 60 Cr 1 -sputim culture pending, MRSA PCR- positive   Plan: -Zosyn 3.375gm IV q8h Vancomycin 1gm q24 est AUC 517 -Will follow renal function, cultures and clinical progress    Height: '5\' 2"'$  (157.5 cm) Weight: 72.2 kg (159 lb 2.8 oz) IBW/kg (Calculated) : 50.1  Temp (24hrs), Avg:97.9 F (36.6 C), Min:97.7 F (36.5 C), Max:98.3 F (36.8 C)  Recent Labs  Lab 05/12/21 1353  WBC 8.6  CREATININE 0.79     Estimated Creatinine Clearance: 58.2 mL/min (by C-G formula based on SCr of 0.79 mg/dL).    Allergies  Allergen Reactions   Bystolic [Nebivolol Hcl] Hives   Cephalexin Hives   Effexor [Venlafaxine] Other (See Comments)    Made her face hot and red   Levaquin [Levofloxacin In D5w] Other (See Comments)    Caused face to turn red and patient became extremely hot     Thank you for allowing pharmacy to be a part of this patient's care.    Bonnita Nasuti Pharm.D. CPP, BCPS Clinical Pharmacist [phone] 05/13/2021 12:14 PM   **Pharmacist phone directory can now be found on [email or web address] ([email or web address]).  Listed under [place].

## 2021-05-13 NOTE — Progress Notes (Signed)
Progress Note  Patient Name: Alexa Orozco Date of Encounter: 05/13/2021  [facility] Cardiologist: Glenetta Hew, MD   Subjective   Patient admitted yesterday with COVID-positive, shortness of breath and cough.  She did initially have chest pain.  Her troponins were mildly elevated.  She is had no pain since admission.  She is on IV heparin.  Inpatient Medications    Scheduled Meds:  aspirin EC  81 mg Oral Daily   aspirin  325 mg Oral Daily   Chlorhexidine Gluconate Cloth  6 each Topical Q0600   clopidogrel  75 mg Oral Daily   enalapril  2.5 mg Oral Daily   hydrALAZINE  50 mg Oral Q6H   levothyroxine  88 mcg Oral QAC breakfast   mouth rinse  15 mL Mouth Rinse BID   montelukast  10 mg Oral QHS   mupirocin ointment  1 application Nasal BID   pantoprazole  40 mg Oral BH-q7a   pravastatin  20 mg Oral BH-q7a   predniSONE  10 mg Oral Q breakfast   sertraline  150 mg Oral Daily   umeclidinium bromide  2 puff Inhalation BH-q7a   Continuous Infusions:  heparin 750 Units/hr (05/13/21 0106)   lactated ringers 100 mL/hr at 05/13/21 0100   PRN Meds: acetaminophen, albuterol, ALPRAZolam, hydrALAZINE, influenza vaccine adjuvanted, nitroGLYCERIN, ondansetron (ZOFRAN) IV, zolpidem   Vital Signs    Vitals:   05/13/21 0100 05/13/21 0300 05/13/21 0500 05/13/21 0800  BP: (!) 144/76 (!) 159/84 (!) 146/82 (!) 176/92  Pulse: (!) 57 61 (!) 53 (!) 55  Resp: (!) '22 19 14 13  '$ Temp: 97.7 F (36.5 C)     TempSrc: Oral     SpO2: 97% 98% 97% 99%  Weight:      Height:        Intake/Output Summary (Last 24 hours) at 05/13/2021 0838 Last data filed at 05/13/2021 0106 Gross per 24 hour  Intake 300.37 ml  Output --  Net 300.37 ml   Last 3 Weights 05/13/2021 05/12/2021 05/09/2021  Weight (lbs) 159 lb 2.8 oz 161 lb 14.4 oz 160 lb  Weight (kg) 72.2 kg 73.437 kg 72.576 kg      Telemetry    Sinus rhythm- Personally Reviewed  ECG    None performed today- Personally Reviewed  Physical Exam    Physical exam was not performed.  This was a telephone hospital round.  Labs    High Sensitivity Troponin:   Recent Labs  Lab 05/12/21 1353 05/12/21 1535 05/12/21 1802 05/12/21 2019 05/13/21 0553  TROPONINIHS 99* 323* 749* 905* 588*      Chemistry Recent Labs  Lab 05/12/21 1353  NA 137  K 3.9  CL 105  CO2 23  GLUCOSE 137*  BUN 17  CREATININE 0.79  CALCIUM 9.0  PROT 8.1  ALBUMIN 4.2  AST 45*  ALT 50*  ALKPHOS 123  BILITOT 0.5  GFRNONAA >60  ANIONGAP 9     Hematology Recent Labs  Lab 05/12/21 1353  WBC 8.6  RBC 4.80  HGB 14.9  HCT 44.7  MCV 93.1  MCH 31.0  MCHC 33.3  RDW 15.0  PLT 378    BNP Recent Labs  Lab 05/12/21 1355  BNP 76.0     DDimer  Recent Labs  Lab 05/12/21 1353  DDIMER 0.42     Radiology    DG Chest Port 1 View  Result Date: 05/12/2021 CLINICAL DATA:  Shortness of breath EXAM: PORTABLE CHEST 1 VIEW COMPARISON:  Chest  radiograph 08/29/2018 FINDINGS: The cardiomediastinal silhouette is normal. The lungs are hyperinflated consistent with COPD. There is no focal consolidation. There is no pulmonary edema. There is no pleural effusion or pneumothorax. There is biapical pleural scarring, unchanged. There is no acute osseous abnormality. IMPRESSION: Hyperinflated lungs consistent with COPD. No focal consolidation or pleural effusion. Electronically Signed   By: Valetta Mole M.D.   On: 05/12/2021 14:46    Cardiac Studies   None  Patient Profile     Alexa Orozco is a 75 y.o. female with a hx of CAD s/p PCI 2007, chronic exertional dyspnea, bladder CA, allergies, asthma, depression, dyslipidemia, emphysema, GERD, tobacco abuse, HTN, hypothyroidism, osteoporosis, Raynaud's who is being seen 05/12/2021 for the evaluation of elevated troponin at the request of Dr. Kennon Rounds.  Assessment & Plan    1: CAD-history of remote RCA PCI in 2007 with moderate LAD disease.  Stress test in 2014 was normal.  She says shortness of breath and a cough over  the last week with a recent episode of chest pain rating to her neck.  Her troponins peaked at 900 and nailer on the way down.  She said no recurrent pain.  She is on IV heparin.  It certainly possible that this is demand ischemia or related to her COVID infection.  2D echo is pending.  2: COVID infection-per primary team  3: Essential hypertension-blood pressures have been elevated.  Not on antihypertensive medicines at home.  Agree with avoiding beta-blocker.  Could start low-dose amlodipine.  4: Hyperlipidemia-on pravastatin at home  Await 2D echo.  No immediate plans for invasive evaluation.  Once over her COVID symptoms could do noninvasive functional study as an outpatient.  Agree with aspirin and Plavix.  For questions or updates, please contact [place] Please consult [email or web address] for contact info under        Signed, Quay Burow, MD  05/13/2021, 8:38 AM

## 2021-05-13 NOTE — Progress Notes (Signed)
Called to patient's room by another RN d/t patient complaining of 5 out of 10 right chest wall pain.  12 Lead EKG obtained.  By the time I arrived to the room with SL Nitro, patient stated pain was easing off and she did not want the medication.  She states that this pain comes and goes.  Emotional support provided to patient.  She is now resting comfortably.

## 2021-05-13 NOTE — Progress Notes (Signed)
Pharmacy Antibiotic Note  Alexa Orozco is a 75 y.o. female admitted on 05/12/2021 with concern of pneumonia.  Pharmacy has been consulted for zosyn dosing (she is also on zyvox) -WBC= 8.6, afebrile, CrCl ~ 60 -sputim culture pending, MRSA PCR- positive   Plan: -Zosyn 3.375gm IV q8h -Will follow renal function, cultures and clinical progress    Height: '5\' 2"'$  (157.5 cm) Weight: 72.2 kg (159 lb 2.8 oz) IBW/kg (Calculated) : 50.1  Temp (24hrs), Avg:97.9 F (36.6 C), Min:97.7 F (36.5 C), Max:98.3 F (36.8 C)  Recent Labs  Lab 05/12/21 1353  WBC 8.6  CREATININE 0.79    Estimated Creatinine Clearance: 58.2 mL/min (by C-G formula based on SCr of 0.79 mg/dL).    Allergies  Allergen Reactions   Bystolic [Nebivolol Hcl] Hives   Cephalexin Hives   Effexor [Venlafaxine] Other (See Comments)    Made her face hot and red   Levaquin [Levofloxacin In D5w] Other (See Comments)    Caused face to turn red and patient became extremely hot     Thank you for allowing pharmacy to be a part of this patient's care.  Hildred Laser, PharmD Clinical Pharmacist **Pharmacist phone directory can now be found on [email or web address] ([email or web address]).  Listed under [place].

## 2021-05-13 NOTE — Progress Notes (Signed)
Patient states she feels too bad for IV dressing change today

## 2021-05-13 NOTE — Progress Notes (Signed)
Pharmacy Antibiotic Note  Alexa Orozco is a 75 y.o. female admitted on 05/12/2021 with elevated troponin and pneumonia (suspected bacterial pneumonia with COVID infection). Pt is being treated with Zosyn and vancomycin. Pharmacy has been consulted for remdesivir dosing.  WBC 8.6, afebrile; AST/ALT 45/50  Plan: Remdesivir 200 mg IV X 1, followed by remdesivir 100 mg IV daily X 4 doses Monitor AST/ALT daily while on remdesivir  Height: '5\' 2"'$  (157.5 cm) Weight: 72.2 kg (159 lb 2.8 oz) IBW/kg (Calculated) : 50.1  Temp (24hrs), Avg:97.7 F (36.5 C), Min:97.7 F (36.5 C), Max:97.8 F (36.6 C)  Recent Labs  Lab 05/12/21 1353  WBC 8.6  CREATININE 0.79    Estimated Creatinine Clearance: 58.2 mL/min (by C-G formula based on SCr of 0.79 mg/dL).    Allergies  Allergen Reactions   Bystolic [Nebivolol Hcl] Hives   Cephalexin Hives   Effexor [Venlafaxine] Other (See Comments)    Made her face hot and red   Levaquin [Levofloxacin In D5w] Other (See Comments)    Caused face to turn red and patient became extremely hot     Antimicrobials this admission: Zosyn  9/13 >> Vancomycin  9/13 >>  Microbiology results: 9/12 COVID: positive 9/12 Flu A, flu B: negative 9/13 MRSA PCR: positive 9/13 Sputum cx: rare GPC in pairs, rare GNR; cx pending  Thank you for allowing pharmacy to be a part of this patient's care.  Gillermina Hu, PharmD, BCPS, [facility] Clinical Pharmacist 05/13/2021 2:54 PM

## 2021-05-13 NOTE — Progress Notes (Signed)
Critical troponin 588 paged to Dr. Tonie Griffith

## 2021-05-14 DIAGNOSIS — I2510 Atherosclerotic heart disease of native coronary artery without angina pectoris: Secondary | ICD-10-CM | POA: Diagnosis not present

## 2021-05-14 DIAGNOSIS — I214 Non-ST elevation (NSTEMI) myocardial infarction: Secondary | ICD-10-CM | POA: Diagnosis not present

## 2021-05-14 DIAGNOSIS — Z9861 Coronary angioplasty status: Secondary | ICD-10-CM | POA: Diagnosis not present

## 2021-05-14 LAB — HEPATIC FUNCTION PANEL
ALT: 57 U/L — ABNORMAL HIGH (ref 0–44)
AST: 52 U/L — ABNORMAL HIGH (ref 15–41)
Albumin: 3.1 g/dL — ABNORMAL LOW (ref 3.5–5.0)
Alkaline Phosphatase: 94 U/L (ref 38–126)
Bilirubin, Direct: <0.1 mg/dL (ref 0.0–0.2)
Total Bilirubin: 0.7 mg/dL (ref 0.3–1.2)
Total Protein: 6.4 g/dL — ABNORMAL LOW (ref 6.5–8.1)

## 2021-05-14 LAB — BASIC METABOLIC PANEL
Anion gap: 10 (ref 5–15)
BUN: 14 mg/dL (ref 8–23)
CO2: 23 mmol/L (ref 22–32)
Calcium: 8.9 mg/dL (ref 8.9–10.3)
Chloride: 103 mmol/L (ref 98–111)
Creatinine, Ser: 0.74 mg/dL (ref 0.44–1.00)
GFR, Estimated: >60 mL/min (ref >60)
Glucose, Bld: 133 mg/dL — ABNORMAL HIGH (ref 70–99)
Potassium: 4.3 mmol/L (ref 3.5–5.1)
Sodium: 136 mmol/L (ref 135–145)

## 2021-05-14 LAB — CBC
HCT: 40.2 % (ref 36.0–46.0)
Hemoglobin: 13.5 g/dL (ref 12.0–15.0)
MCH: 30.8 pg (ref 26.0–34.0)
MCHC: 33.6 g/dL (ref 30.0–36.0)
MCV: 91.6 fL (ref 80.0–100.0)
Platelets: 379 10*3/uL (ref 150–400)
RBC: 4.39 MIL/uL (ref 3.87–5.11)
RDW: 14.8 % (ref 11.5–15.5)
WBC: 7.5 10*3/uL (ref 4.0–10.5)
nRBC: 0 % (ref 0.0–0.2)

## 2021-05-14 LAB — HEPARIN LEVEL (UNFRACTIONATED): Heparin Unfractionated: 0.52 IU/mL (ref 0.30–0.70)

## 2021-05-14 MED ORDER — ENOXAPARIN SODIUM 40 MG/0.4ML IJ SOSY
40.0000 mg | PREFILLED_SYRINGE | Freq: Every day | INTRAMUSCULAR | Status: DC
  Administered 2021-05-14 – 2021-05-15 (×2): 40 mg via SUBCUTANEOUS
  Filled 2021-05-14 (×2): qty 0.4

## 2021-05-14 NOTE — Progress Notes (Signed)
[place] for Heparin Indication: chest pain/ACS  Allergies  Allergen Reactions   Bystolic [Nebivolol Hcl] Hives   Cephalexin Hives   Effexor [Venlafaxine] Other (See Comments)    Made her face hot and red   Levaquin [Levofloxacin In D5w] Other (See Comments)    Caused face to turn red and patient became extremely hot     Patient Measurements: Height: '5\' 2"'$  (157.5 cm) Weight: 72.2 kg (159 lb 2.8 oz) IBW/kg (Calculated) : 50.1 HEPARIN DW (KG): 65.5  Vital Signs: Temp: 98 F (36.7 C) (09/14 0800) Temp Source: Oral (09/14 0800) BP: 146/73 (09/14 0610) Pulse Rate: 59 (09/14 0800)  Labs: Recent Labs    05/12/21 1353 05/12/21 1535 05/12/21 1802 05/12/21 2019 05/12/21 2329 05/13/21 0553 05/14/21 0107  HGB 14.9  --   --   --   --   --  13.5  HCT 44.7  --   --   --   --   --  40.2  PLT 378  --   --   --   --   --  379  APTT 27  --   --   --   --   --   --   LABPROT 12.1  --   --   --   --   --   --   INR 0.9  --   --   --   --   --   --   HEPARINUNFRC  --   --   --   --  0.90* 0.67 0.52  CREATININE 0.79  --   --   --   --   --  0.74  TROPONINIHS 99*   < > 749* 905*  --  588*  --    < > = values in this interval not displayed.     Estimated Creatinine Clearance: 58.2 mL/min (by C-G formula based on SCr of 0.74 mg/dL).   Assessment: 75 y.o. female with chest pain on heparin. No plans for cardiac cath and anticipate discontinuation of heparin per cardiology recommendations -heparin level and at goal and CBC stable  Goal of Therapy:  Heparin level 0.3-0.7 units/ml Monitor platelets by anticoagulation protocol: Yes   Plan:  Continue heparin at 750 units/hr Consider discontinuing heparin   Hildred Laser, PharmD Clinical Pharmacist **Pharmacist phone directory can now be found on [email or web address] ([email or web address]).  Listed under [place].  e

## 2021-05-14 NOTE — Progress Notes (Signed)
Pharmacy Antibiotic Note  Alexa Orozco is a 75 y.o. female admitted on 05/12/2021 with concern of pneumonia.  Pharmacy has been consulted for zosyn and vancomycin dosing -WBC= 8.6, afebrile, CrCl ~ 60 -sputim culture with pseudomonas and GPC/pairs   Plan: -Zosyn 3.375gm IV q8h -Continue vancomycin '1000mg'$  IV q24h and f/u final cultures -Will follow renal function, cultures and clinical progress    Height: '5\' 2"'$  (157.5 cm) Weight: 72.2 kg (159 lb 2.8 oz) IBW/kg (Calculated) : 50.1  Temp (24hrs), Avg:98.2 F (36.8 C), Min:98 F (36.7 C), Max:98.4 F (36.9 C)  Recent Labs  Lab 05/12/21 1353 05/14/21 0107  WBC 8.6 7.5  CREATININE 0.79 0.74     Estimated Creatinine Clearance: 58.2 mL/min (by C-G formula based on SCr of 0.74 mg/dL).    Allergies  Allergen Reactions   Bystolic [Nebivolol Hcl] Hives   Cephalexin Hives   Effexor [Venlafaxine] Other (See Comments)    Made her face hot and red   Levaquin [Levofloxacin In D5w] Other (See Comments)    Caused face to turn red and patient became extremely hot     Thank you for allowing pharmacy to be a part of this patient's care.  Hildred Laser, PharmD Clinical Pharmacist **Pharmacist phone directory can now be found on [email or web address] ([email or web address]).  Listed under [place].

## 2021-05-14 NOTE — Progress Notes (Signed)
Progress Note  Patient Name: Alexa Orozco Date of Encounter: 05/14/2021  [facility] Cardiologist: Glenetta Hew, MD   Subjective   Patient admitted yesterday with COVID-positive, shortness of breath and cough.  She did initially have chest pain.  Her troponins were mildly elevated.  She is had no pain since admission.  She is on IV heparin.  Inpatient Medications    Scheduled Meds:  aspirin EC  81 mg Oral Daily   Chlorhexidine Gluconate Cloth  6 each Topical Q0600   cholecalciferol  1,000 Units Oral Daily   clopidogrel  75 mg Oral Daily   enalapril  2.5 mg Oral Daily   hydrALAZINE  50 mg Oral Q6H   Ipratropium-Albuterol  1 puff Inhalation TID   levothyroxine  88 mcg Oral QAC breakfast   mouth rinse  15 mL Mouth Rinse BID   methylPREDNISolone (SOLU-MEDROL) injection  40 mg Intravenous Q12H   montelukast  10 mg Oral QHS   mupirocin ointment  1 application Nasal BID   pantoprazole  40 mg Oral BH-q7a   pravastatin  20 mg Oral BH-q7a   sertraline  150 mg Oral Daily   umeclidinium bromide  2 puff Inhalation BH-q7a   vitamin C  250 mg Oral Daily   zinc sulfate  220 mg Oral Daily   Continuous Infusions:  heparin 750 Units/hr (05/13/21 0106)   lactated ringers 30 mL/hr at 05/13/21 1520   piperacillin-tazobactam (ZOSYN)  IV 3.375 g (05/14/21 0144)   remdesivir 100 mg in NS 100 mL     vancomycin 1,000 mg (05/13/21 1629)   PRN Meds: acetaminophen, albuterol, ALPRAZolam, hydrALAZINE, influenza vaccine adjuvanted, nitroGLYCERIN, ondansetron (ZOFRAN) IV, zolpidem   Vital Signs    Vitals:   05/13/21 2332 05/14/21 0344 05/14/21 0610 05/14/21 0800  BP: (!) 163/77 129/66 (!) 146/73   Pulse: 60 (!) 58  (!) 59  Resp: 16 16    Temp: 98.3 F (36.8 C)   98 F (36.7 C)  TempSrc: Oral   Oral  SpO2: 95% 96%    Weight:      Height:        Intake/Output Summary (Last 24 hours) at 05/14/2021 0920 Last data filed at 05/14/2021 0344 Gross per 24 hour  Intake 1606.41 ml  Output 1200  ml  Net 406.41 ml   Last 3 Weights 05/13/2021 05/12/2021 05/09/2021  Weight (lbs) 159 lb 2.8 oz 161 lb 14.4 oz 160 lb  Weight (kg) 72.2 kg 73.437 kg 72.576 kg      Telemetry    Sinus rhythm- Personally Reviewed  ECG    None performed today- Personally Reviewed  Physical Exam   Physical exam was not performed.  This was a telephone hospital round.  Labs    High Sensitivity Troponin:   Recent Labs  Lab 05/12/21 1353 05/12/21 1535 05/12/21 1802 05/12/21 2019 05/13/21 0553  TROPONINIHS 99* 323* 749* 905* 588*      Chemistry Recent Labs  Lab 05/12/21 1353 05/14/21 0107  NA 137 136  K 3.9 4.3  CL 105 103  CO2 23 23  GLUCOSE 137* 133*  BUN 17 14  CREATININE 0.79 0.74  CALCIUM 9.0 8.9  PROT 8.1 6.4*  ALBUMIN 4.2 3.1*  AST 45* 52*  ALT 50* 57*  ALKPHOS 123 94  BILITOT 0.5 0.7  GFRNONAA >60 >60  ANIONGAP 9 10     Hematology Recent Labs  Lab 05/12/21 1353 05/14/21 0107  WBC 8.6 7.5  RBC 4.80 4.39  HGB 14.9  13.5  HCT 44.7 40.2  MCV 93.1 91.6  MCH 31.0 30.8  MCHC 33.3 33.6  RDW 15.0 14.8  PLT 378 379    BNP Recent Labs  Lab 05/12/21 1355  BNP 76.0     DDimer  Recent Labs  Lab 05/12/21 1353  DDIMER 0.42     Radiology    DG CHEST PORT 1 VIEW  Result Date: 05/13/2021 CLINICAL DATA:  Hypoxia.  COVID positive EXAM: PORTABLE CHEST 1 VIEW COMPARISON:  Yesterday FINDINGS: No air bronchogram, edema, effusion, or pneumothorax. Evidence of small hiatal hernia. Normal heart size and mediastinal contours. IMPRESSION: No focal pneumonia or other change from yesterday. Electronically Signed   By: Jorje Guild M.D.   On: 05/13/2021 09:58   DG Chest Port 1 View  Result Date: 05/12/2021 CLINICAL DATA:  Shortness of breath EXAM: PORTABLE CHEST 1 VIEW COMPARISON:  Chest radiograph 08/29/2018 FINDINGS: The cardiomediastinal silhouette is normal. The lungs are hyperinflated consistent with COPD. There is no focal consolidation. There is no pulmonary edema.  There is no pleural effusion or pneumothorax. There is biapical pleural scarring, unchanged. There is no acute osseous abnormality. IMPRESSION: Hyperinflated lungs consistent with COPD. No focal consolidation or pleural effusion. Electronically Signed   By: Valetta Mole M.D.   On: 05/12/2021 14:46   ECHOCARDIOGRAM COMPLETE  Result Date: 05/13/2021    ECHOCARDIOGRAM REPORT   Patient Name:   Alexa Orozco  Date of Exam: 05/13/2021 Medical Rec #:  [id number]  Height:       62.0 in Accession #:    [id number] Weight:       159.2 lb Date of Birth:  1945-12-04   BSA:          1.735 m Patient Age:    75 years   BP:           176/92 mmHg Patient Gender: F          HR:           55 bpm. Exam Location:  [facility] Procedure: 2D Echo, Cardiac Doppler and Color Doppler Indications:    NSTEMI  History:        Patient has prior history of Echocardiogram examinations, most                 recent 05/22/2009. CAD, COPD; Risk Factors:Hypertension, Current                 Smoker and Dyslipidemia. COVID +.  Sonographer:    Wenda Low Referring Phys: [place]  1. Left ventricular ejection fraction, by estimation, is 60 to 65%. The left ventricle has normal function. The left ventricle has no regional wall motion abnormalities. Left ventricular diastolic parameters are consistent with Grade I diastolic dysfunction (impaired relaxation).  2. Right ventricular systolic function is normal. The right ventricular size is normal. There is normal pulmonary artery systolic pressure.  3. The mitral valve is normal in structure. Trivial mitral valve regurgitation. No evidence of mitral stenosis.  4. The aortic valve is tricuspid. Aortic valve regurgitation is not visualized. No aortic stenosis is present.  5. The inferior vena cava is normal in size with greater than 50% respiratory variability, suggesting right atrial pressure of 3 mmHg. FINDINGS  Left Ventricle: Left ventricular ejection fraction, by estimation, is 60 to  65%. The left ventricle has normal function. The left ventricle has no regional wall motion abnormalities. The left ventricular internal cavity size was normal in size. There is  no left ventricular hypertrophy. Left ventricular diastolic parameters are consistent with Grade I diastolic dysfunction (impaired relaxation). Indeterminate filling pressures. Right Ventricle: The right ventricular size is normal. No increase in right ventricular wall thickness. Right ventricular systolic function is normal. There is normal pulmonary artery systolic pressure. The tricuspid regurgitant velocity is 1.62 m/s, and  with an assumed right atrial pressure of 3 mmHg, the estimated right ventricular systolic pressure is [id number] mmHg. Left Atrium: Left atrial size was normal in size. Right Atrium: Right atrial size was normal in size. Pericardium: There is no evidence of pericardial effusion. Mitral Valve: The mitral valve is normal in structure. Trivial mitral valve regurgitation. No evidence of mitral valve stenosis. MV peak gradient, 3.9 mmHg. The mean mitral valve gradient is 1.0 mmHg. Tricuspid Valve: The tricuspid valve is normal in structure. Tricuspid valve regurgitation is trivial. No evidence of tricuspid stenosis. Aortic Valve: The aortic valve is tricuspid. Aortic valve regurgitation is not visualized. No aortic stenosis is present. Aortic valve mean gradient measures 3.0 mmHg. Aortic valve peak gradient measures 8.6 mmHg. Aortic valve area, by VTI measures 2.18 cm. Pulmonic Valve: The pulmonic valve was normal in structure. Pulmonic valve regurgitation is not visualized. No evidence of pulmonic stenosis. Aorta: The aortic root is normal in size and structure. Venous: The inferior vena cava is normal in size with greater than 50% respiratory variability, suggesting right atrial pressure of 3 mmHg. IAS/Shunts: No atrial level shunt detected by color flow Doppler.  LEFT VENTRICLE PLAX 2D LVIDd:         4.60 cm     Diastology  LVIDs:         2.80 cm     LV e' medial:    7.62 cm/s LV PW:         1.10 cm     LV E/e' medial:  7.0 LV IVS:        1.00 cm     LV e' lateral:   9.25 cm/s LVOT diam:     1.90 cm     LV E/e' lateral: 5.7 LV SV:         69 LV SV Index:   40 LVOT Area:     2.84 cm  LV Volumes (MOD) LV vol d, MOD A2C: 70.3 ml LV vol d, MOD A4C: 67.3 ml LV vol s, MOD A2C: 20.3 ml LV vol s, MOD A4C: 27.4 ml LV SV MOD A2C:     50.0 ml LV SV MOD A4C:     67.3 ml LV SV MOD BP:      46.6 ml RIGHT VENTRICLE RV Basal diam:  2.85 cm RV Mid diam:    2.30 cm RV S prime:     13.60 cm/s TAPSE (M-mode): 3.1 cm LEFT ATRIUM             Index       RIGHT ATRIUM           Index LA diam:        3.40 cm 1.96 cm/m  RA Area:     14.20 cm LA Vol (A2C):   48.9 ml 28.19 ml/m RA Volume:   37.50 ml  21.62 ml/m LA Vol (A4C):   42.6 ml 24.55 ml/m LA Biplane Vol: 45.7 ml 26.34 ml/m  AORTIC VALVE AV Area (Vmax):    2.04 cm AV Area (Vmean):   2.20 cm AV Area (VTI):     2.18 cm AV Vmax:  147.00 cm/s AV Vmean:          85.400 cm/s AV VTI:            0.318 m AV Peak Grad:      8.6 mmHg AV Mean Grad:      3.0 mmHg LVOT Vmax:         106.00 cm/s LVOT Vmean:        66.300 cm/s LVOT VTI:          0.245 m LVOT/AV VTI ratio: 0.77  AORTA Ao Root diam: 3.00 cm MITRAL VALVE               TRICUSPID VALVE MV Area (PHT): 3.87 cm    TR Peak grad:   10.5 mmHg MV Area VTI:   1.99 cm    TR Vmax:        161.80 cm/s MV Peak grad:  3.9 mmHg MV Mean grad:  1.0 mmHg    SHUNTS MV Vmax:       0.99 m/s    Systemic VTI:  0.24 m MV Vmean:      53.1 cm/s   Systemic Diam: 1.90 cm MV Decel Time: 196 msec MV E velocity: 52.98 cm/s MV A velocity: 81.40 cm/s MV E/A ratio:  0.65 Skeet Latch MD Electronically signed by Skeet Latch MD Signature Date/Time: 05/13/2021/2:08:39 PM    Final     Cardiac Studies   2D echocardiogram (05/13/2021)  IMPRESSIONS     1. Left ventricular ejection fraction, by estimation, is 60 to 65%. The  left ventricle has normal function. The  left ventricle has no regional  wall motion abnormalities. Left ventricular diastolic parameters are  consistent with Grade I diastolic  dysfunction (impaired relaxation).   2. Right ventricular systolic function is normal. The right ventricular  size is normal. There is normal pulmonary artery systolic pressure.   3. The mitral valve is normal in structure. Trivial mitral valve  regurgitation. No evidence of mitral stenosis.   4. The aortic valve is tricuspid. Aortic valve regurgitation is not  visualized. No aortic stenosis is present.   5. The inferior vena cava is normal in size with greater than 50%  respiratory variability, suggesting right atrial pressure of 3 mmHg.   Patient Profile     Alexa Orozco is a 75 y.o. female with a hx of CAD s/p PCI 2007, chronic exertional dyspnea, bladder CA, allergies, asthma, depression, dyslipidemia, emphysema, GERD, tobacco abuse, HTN, hypothyroidism, osteoporosis, Raynaud's who is being seen 05/12/2021 for the evaluation of elevated troponin at the request of Dr. Kennon Rounds.  Assessment & Plan    1: CAD-history of remote RCA PCI in 2007 with moderate LAD disease.  Stress test in 2014 was normal.  She says shortness of breath and a cough over the last week with a recent episode of chest pain rating to her neck.  Her troponins peaked at 900 and nailer on the way down.  She said no recurrent pain.  She is on IV heparin.  It certainly possible that this is demand ischemia or related to her COVID infection.  2D echo revealed normal LV systolic function.  2: COVID infection-per primary team  3: Essential hypertension-blood pressures have been elevated.  Not on antihypertensive medicines at home.  Agree with avoiding beta-blocker.  Could start low-dose amlodipine.  4: Hyperlipidemia-on pravastatin at home  2D echo revealed normal LV systolic function.  Agree with aspirin and Plavix.  This point can probably discontinue IV heparin and  begin Lovenox for DVT  prophylaxis.  We will pursue outpatient functional study once she is recovered from her COVID illness.  We will sign off for now but be available for further questions.  For questions or updates, please contact [place] Please consult [email or web address] for contact info under        Signed, Quay Burow, MD  05/14/2021, 9:20 AM

## 2021-05-14 NOTE — TOC Progression Note (Signed)
Transition of Care [facility]) - Progression Note    Patient Details  Name: Alexa Orozco MRN: [id number] Date of Birth: 1946-07-11  Transition of [facility] CM/SW Contact  Zenon Mayo, RN Phone Number: 05/14/2021, 3:14 PM  Clinical Narrative:    NCM spoke with patient over the phone, she states she does not want the 3 n 1, but if she has to use oxygen at home it is ok to use [employer] to supply the oxygen.  She lives with son in an apartment that is hadicapped equipped.         Expected Discharge Plan and Services                                                 Social Determinants of Health (SDOH) Interventions    Readmission Risk Interventions No flowsheet data found.

## 2021-05-14 NOTE — Evaluation (Signed)
Occupational Therapy Evaluation Patient Details Name: Alexa Orozco MRN: [id number] DOB: Mar 05, 1946 Today's Date: 05/14/2021   History of Present Illness Pt is a 75 y.o. female admitted 05/12/10 with c/o progressive SOB, cough, congestion, chest pressure; pt tested (+) COVID-19. Workup for elevated troponin, suspect either NSTEMI or demand ischemia secondary to COVID. PMH includes CAD, asthma, emphysema, HTN, COPD, bladder CA, osteoporosis, Raynaud's.   Clinical Impression   Pt agreeable to session despite just returning to bed, endorses being indep with all ADLs and mobility prior to admission, living with son who is unable to provide physical assistance, in a handicap accessible apartment.  Overall pt demo's need for min guard with functional transfers OOB limited primarily by activity tolerance and line management, transitioned to being on 3L [place] during session with intermittent drop in O2 to 84 requiring <1 minutes recovery. Anticipate with improved overall health status, pt will not require post acute OT at time of dc but will benefit from additional sessions for education and self care retraining to maximize safety, activity tolerance and strength to be able to return to home. Pt left with ECT hand out in room, with OT to continue to follow.      Recommendations for follow up therapy are one component of a multi-disciplinary discharge planning process, led by the attending physician.  Recommendations may be updated based on patient status, additional functional criteria and insurance authorization.   Follow Up Recommendations  Supervision - Intermittent;No OT follow up    Equipment Recommendations  3 in 1 bedside commode    Recommendations for Other Services       Precautions / Restrictions Precautions Precautions: Fall Precaution Comments: 3L O2 Restrictions Weight Bearing Restrictions: No      Mobility Bed Mobility Overal bed mobility: Independent                   Transfers Overall transfer level: Needs assistance Equipment used: None Transfers: Sit to/from Stand;Lateral/Scoot Transfers Sit to Stand: Min guard        Lateral/Scoot Transfers: Independent      Balance Overall balance assessment: Needs assistance   Sitting balance-Leahy Scale: Good       Standing balance-Leahy Scale: Fair                             ADL either performed or assessed with clinical judgement   ADL Overall ADL's : Needs assistance/impaired                 Upper Body Dressing : Independent   Lower Body Dressing: Independent Lower Body Dressing Details (indicate cue type and reason): donn.doff socks seated EOB, demo's SBA for standing clothing management- limited by lines Toilet Transfer: Min Psychiatric nurse Details (indicate cue type and reason): reports of SOB, fatigue and onset of nausea with simulated transfer. provided HHA and min guard for safety with management of lines Toileting- Clothing Manipulation and Hygiene: Min guard;Sit to/from stand       Functional mobility during ADLs: Min guard General ADL Comments: full adl session limited primarily d/t significant amount of lines present and pt with decreased tolerance and fatigue throughout session. was benefiting from intermittent min guard for safety but without over LOB.     Vision Baseline Vision/History: 1 Wears glasses Vision Assessment?: No apparent visual deficits     Perception     Praxis      Pertinent Vitals/Pain Pain Assessment: 0-10 Pain Score: 5  Pain Location: headache Pain Descriptors / Indicators: Aching Pain Intervention(s): Repositioned;Limited activity within patient's tolerance     Hand Dominance Right   Extremity/Trunk Assessment Upper Extremity Assessment Upper Extremity Assessment: Overall WFL for tasks assessed   Lower Extremity Assessment Lower Extremity Assessment: Defer to PT evaluation   Cervical / Trunk Assessment Cervical  / Trunk Assessment: Normal   Communication Communication Communication: No difficulties   Cognition Arousal/Alertness: Awake/alert Behavior During Therapy: WFL for tasks assessed/performed;Flat affect Overall Cognitive Status: Within Functional Limits for tasks assessed                                 General Comments: appears to grossly be WFL, no indications for need for formal assessment at this time   General Comments  reviewed sidelying or elevated positioning for improved SOB and breath control, reviewed pursed lip breathing with activity and use of O2 via [place], provided pt with hand out but did not review ECTs d/t fatigue and reports of 5/10 headache    Exercises     Shoulder Instructions      Home Living Family/patient expects to be discharged to:: Private residence Living Arrangements: Children Available Help at Discharge: Family;Available 24 hours/day Type of Home: Apartment Home Access: Level entry     Home Layout: One level     Bathroom Shower/Tub: Teacher, early years/pre: Handicapped height Bathroom Accessibility: Yes       Additional Comments: lives with son who is handicapped but able to care for himself, baseline pt is indep with all ADLs and IADLs      Prior Functioning/Environment Level of Independence: Independent        Comments: Recent participation with CA treatment/bladder CA, indep with all IADLs/ADL tasks, and no falls. stands for bathing. drives.        OT Problem List: Decreased strength;Decreased activity tolerance;Decreased knowledge of precautions;Cardiopulmonary status limiting activity      OT Treatment/Interventions: Self-care/ADL training;Therapeutic exercise;Energy conservation;DME and/or AE instruction;Therapeutic activities;Patient/family education    OT Goals(Current goals can be found in the care plan section) Acute Rehab OT Goals Patient Stated Goal: be able to go back home OT Goal Formulation: With  patient Time For Goal Achievement: 05/28/21 Potential to Achieve Goals: Good ADL Goals Pt Will Transfer to Toilet: Independently Pt Will Perform Toileting - Clothing Manipulation and hygiene: Independently Additional ADL Goal #1: pt will be able to report 3 ECTs to use for ADL tasks with 0 cues Additional ADL Goal #2: pt will be indep with morning ADL routine with utilization of ECTs appropriately  OT Frequency: Min 2X/week   Barriers to D/C:            Co-evaluation              AM-PAC OT "6 Clicks" Daily Activity     Outcome Measure Help from another person eating meals?: None Help from another person taking care of personal grooming?: None Help from another person toileting, which includes using toliet, bedpan, or urinal?: A Little Help from another person bathing (including washing, rinsing, drying)?: A Little Help from another person to put on and taking off regular upper body clothing?: A Little Help from another person to put on and taking off regular lower body clothing?: A Little 6 Click Score: 20   End of Session Equipment Utilized During Treatment: Oxygen Nurse Communication: Mobility status  Activity Tolerance: Patient limited by fatigue Patient left:  in bed;with bed alarm set;with call bell/phone within reach (4 rails up per pt request)  OT Visit Diagnosis: Unsteadiness on feet (R26.81);Muscle weakness (generalized) (M62.81)                Time: [id number] OT Time Calculation (min): 18 min Charges:  OT General Charges $OT Visit: 1 Visit OT Evaluation $OT Eval Low Complexity: 1 Low  Ishita Mcnerney OTR/L acute rehab services Office: [phone] 05/14/2021, 1:57 PM

## 2021-05-14 NOTE — Evaluation (Signed)
Physical Therapy Evaluation Patient Details Name: Alexa Orozco MRN: [id number] DOB: [birth year] Today's Date: 05/14/2021  History of Present Illness  Pt is a 75 y.o. female admitted 05/12/10 with c/o progressive SOB, cough, congestion, chest pressure; pt tested (+) COVID-19. Workup for elevated troponin, suspect either NSTEMI or demand ischemia secondary to COVID. PMH includes CAD, asthma, emphysema, HTN, COPD, bladder CA, osteoporosis, Raynaud's.   Clinical Impression  Pt presents with an overall decrease in functional mobility secondary to above. PTA, pt independent, lives with son. Educ re: pursed lip breathing, activity recommendations, energy conservation, importance of mobility. Today, pt able to transfer and ambulate without DME, min guard for balance; 1x LOB requiring minA to correct. Expect pt to progress well with activity. Pt would benefit from continued acute PT services to maximize functional mobility and independence prior to d/c home.    SpO2 >/91% on RA when reliable pleth 94% on RA at rest when reliable pleth      Recommendations for follow up therapy are one component of a multi-disciplinary discharge planning process, led by the attending physician.  Recommendations may be updated based on patient status, additional functional criteria and insurance authorization.  Follow Up Recommendations No PT follow up;Supervision for mobility/OOB    Equipment Recommendations  None recommended by PT    Recommendations for Other Services       Precautions / Restrictions Precautions Precautions: Fall Restrictions Weight Bearing Restrictions: No      Mobility  Bed Mobility Overal bed mobility: Independent                  Transfers Overall transfer level: Needs assistance Equipment used: None Transfers: Sit to/from Stand Sit to Stand: Min guard            Ambulation/Gait Ambulation/Gait assistance: Min guard;Min assist Gait Distance (Feet): 80 Feet Assistive  device: 1 person hand held assist;None Gait Pattern/deviations: Step-through pattern;Decreased stride length;Staggering left Gait velocity: Decreased   General Gait Details: Slow, mildy unsteady gait with initial HHA for balance, progressing to no UE support, 1x LOB requiring minA to correct; pt's stability increasing with distance, 2x seated rest breaks secondary to fatigue. SpO2 >/91% on RA when reliable pleth  Stairs            Wheelchair Mobility    Modified Rankin (Stroke Patients Only)       Balance Overall balance assessment: Needs assistance   Sitting balance-Leahy Scale: Good       Standing balance-Leahy Scale: Fair                               Pertinent Vitals/Pain Pain Assessment: No/denies pain    Home Living Family/patient expects to be discharged to:: Private residence Living Arrangements: Children Available Help at Discharge: Family;Available 24 hours/day Type of Home: Apartment Home Access: Level entry     Home Layout: One level Home Equipment: Grab bars - tub/shower Additional Comments: Lives with son who is handicapped, but able to care for himself, does driving and runs errands    Prior Function Level of Independence: Independent         Comments: Independent without DME, does not work; does majority of housework (cooking, cleaning). Stands to shower. Reports she recently finished treatments for bladder CA; wears Depends     Hand Dominance        Extremity/Trunk Assessment   Upper Extremity Assessment Upper Extremity Assessment: Overall WFL for tasks assessed  Lower Extremity Assessment Lower Extremity Assessment: Generalized weakness       Communication   Communication: No difficulties  Cognition Arousal/Alertness: Awake/alert Behavior During Therapy: WFL for tasks assessed/performed;Flat affect Overall Cognitive Status: Within Functional Limits for tasks assessed                                  General Comments: WFL for simple tasks, not formally assessed      General Comments General comments (skin integrity, edema, etc.): Educ re: pursed lip breathing, energy conservation, actvity recommendations. SpO2 >/91% on RA when reliable pleth, 94% on RA at rest    Exercises     Assessment/Plan    PT Assessment Patient needs continued PT services  PT Problem List Decreased strength;Decreased activity tolerance;Decreased balance;Decreased mobility;Cardiopulmonary status limiting activity       PT Treatment Interventions DME instruction;Gait training;Functional mobility training;Therapeutic activities;Therapeutic exercise;Balance training;Patient/family education    PT Goals (Current goals can be found in the Care Plan section)  Acute Rehab PT Goals Patient Stated Goal: Return home as soon as possible PT Goal Formulation: With patient Time For Goal Achievement: 05/28/21 Potential to Achieve Goals: Good    Frequency Min 3X/week   Barriers to discharge        Co-evaluation               AM-PAC PT "6 Clicks" Mobility  Outcome Measure Help needed turning from your back to your side while in a flat bed without using bedrails?: None Help needed moving from lying on your back to sitting on the side of a flat bed without using bedrails?: None Help needed moving to and from a bed to a chair (including a wheelchair)?: A Little Help needed standing up from a chair using your arms (e.g., wheelchair or bedside chair)?: A Little Help needed to walk in hospital room?: A Little Help needed climbing 3-5 steps with a railing? : A Little 6 Click Score: 20    End of Session Equipment Utilized During Treatment: Gait belt Activity Tolerance: Patient tolerated treatment well;Patient limited by fatigue Patient left: in chair;with call bell/phone within reach;with chair alarm set Nurse Communication: Mobility status PT Visit Diagnosis: Other abnormalities of gait and mobility  (R26.89)    Time: [id number] PT Time Calculation (min) (ACUTE ONLY): 25 min   Charges:   PT Evaluation $PT Eval Moderate Complexity: 1 Mod PT Treatments $Self Care/Home Management: 8-22      Mabeline Caras, PT, DPT Acute Rehabilitation Services  Pager [phone] Office [phone]  Derry Lory 05/14/2021, 9:25 AM

## 2021-05-14 NOTE — Plan of Care (Signed)
  Problem: Clinical Measurements: Goal: Ability to maintain clinical measurements within normal limits will improve Outcome: Not Progressing Goal: Respiratory complications will improve Outcome: Not Progressing   Problem: Activity: Goal: Risk for activity intolerance will decrease Outcome: Not Progressing

## 2021-05-14 NOTE — Progress Notes (Signed)
PROGRESS NOTE    Alexa Orozco  [id number] DOB: Apr 01, 1946 DOA: 05/12/2021 PCP: Susy Frizzle, MD    Brief Narrative:  Patient with history of COPD, coronary artery disease, GERD, hypothyroidism, bladder cancer status post immunotherapy, peripheral vascular disease hypertension and a smoker came to the emergency room with fever cough and congestion for about 1 week.  Treated for COPD exacerbation with Z-Pak as outpatient.  Negative COVID test at home.  In the emergency room she was hypoxic with oxygen saturation of 80 and is started on 4 L oxygen.  Troponin elevated to 900.  Patient was started on a heparin drip and admitted to the hospital.   Assessment & Plan:   Principal Problem:   NSTEMI (non-ST elevated myocardial infarction) ([facility]) Active Problems:   Essential hypertension   Hypothyroid   COPD (chronic obstructive pulmonary disease) (HCC)   Tobacco abuse   CAD S/P percutaneous coronary angioplasty   Dyslipidemia   Claudication of left lower extremity (H[facility])   Depression   Generalized anxiety disorder   Bladder cancer ([place])   COVID-19 virus infection   Acute on chronic respiratory failure with hypoxia ([facility])  COVID-19 infection with COPD exacerbation, acute hypoxemic respiratory failure: Continue to monitor due to significant symptoms  chest physiotherapy, incentive spirometry, deep breathing exercises, sputum induction, mucolytic's and bronchodilators. Supplemental oxygen to keep saturations more than 90%.  Improving to room air. Covid directed therapy with , steroids, Solu-Medrol 40 mg twice daily. remdesivir day 2/5 antibiotics, treating for COPD exacerbation with Zosyn.  Sputum cultures growing Pseudomonas.  Continue until clinical improvement. Due to severity of symptoms, patient will need daily inflammatory markers, chest x-rays, liver function test to monitor and direct COVID-19 therapies.  Elevated troponins, suspected demand ischemia in the setting of COVID-19  viral infection: Troponin peaked at 905 and downtrending.  Treated with heparin, aspirin and Plavix.  2D echocardiogram was essentially normal.  Chest pain-free.  Cardiology recommended conservative management. Aspirin and Plavix.  Pravastatin.  Beta-blockers.  Discontinue heparin.  Essential hypertension: Blood pressure elevated on presentation.  Today better on enalapril and hydralazine.  Chronic anxiety depression: On Zoloft at home.  Stable.  Continue to mobilize.  Wean off oxygen.   DVT prophylaxis: enoxaparin (LOVENOX) injection 40 mg Start: 05/14/21 2200   Code Status: Full code Family Communication: Patient's son on the phone. Disposition Plan: Status is: Inpatient  Remains inpatient appropriate because:Inpatient level of care appropriate due to severity of illness  Dispo: The patient is from: Home              Anticipated d/c is to: Home              Patient currently is not medically stable to d/c.   Difficult to place patient No         Consultants:  Cardiology  Procedures:  None  Antimicrobials:  Remdesivir 9/13--- Vancomycin 9/13-9/14 Zosyn 9/13----   Subjective: Patient seen and examined.  She was sitting in the bedside chair.  She has some cough and wheezing otherwise she states her breathing is better.  Afebrile.  Denies any chest pain nausea or vomiting.  Objective: Vitals:   05/14/21 0344 05/14/21 0610 05/14/21 0800 05/14/21 1119  BP: 129/66 (!) 146/73  109/72  Pulse: (!) 58  (!) 59 63  Resp: 16   18  Temp:   98 F (36.7 C) (!) 97.5 F (36.4 C)  TempSrc:   Oral Oral  SpO2: 96%   93%  Weight:  Height:        Intake/Output Summary (Last 24 hours) at 05/14/2021 1347 Last data filed at 05/14/2021 0344 Gross per 24 hour  Intake 1586.41 ml  Output 1200 ml  Net 386.41 ml   Filed Weights   05/12/21 1319 05/13/21 0037  Weight: 73.4 kg 72.2 kg    Examination:  General exam: Appears calm and comfortable  Alert and awake.  Slightly  anxious otherwise mostly in good mood.  Not in any distress. Respiratory system: Occasional expiratory wheezes.  Upper airway conducted sounds. Cardiovascular system: S1 & S2 heard, RRR. No JVD, murmurs, rubs, gallops or clicks. No pedal edema. Gastrointestinal system: Soft.  Nontender.  Bowel sounds present.   Central nervous system: Alert and oriented.   Data Reviewed: I have personally reviewed following labs and imaging studies  CBC: Recent Labs  Lab 05/12/21 1353 05/14/21 0107  WBC 8.6 7.5  NEUTROABS 7.3  --   HGB 14.9 13.5  HCT 44.7 40.2  MCV 93.1 91.6  PLT 378 [id number]   Basic Metabolic Panel: Recent Labs  Lab 05/12/21 1353 05/14/21 0107  NA 137 136  K 3.9 4.3  CL 105 103  CO2 23 23  GLUCOSE 137* 133*  BUN 17 14  CREATININE 0.79 0.74  CALCIUM 9.0 8.9   GFR: Estimated Creatinine Clearance: 58.2 mL/min (by C-G formula based on SCr of 0.74 mg/dL). Liver Function Tests: Recent Labs  Lab 05/12/21 1353 05/14/21 0107  AST 45* 52*  ALT 50* 57*  ALKPHOS 123 94  BILITOT 0.5 0.7  PROT 8.1 6.4*  ALBUMIN 4.2 3.1*   No results for input(s): LIPASE, AMYLASE in the last 168 hours. No results for input(s): AMMONIA in the last 168 hours. Coagulation Profile: Recent Labs  Lab 05/12/21 1353  INR 0.9   Cardiac Enzymes: No results for input(s): CKTOTAL, CKMB, CKMBINDEX, TROPONINI in the last 168 hours. BNP (last 3 results) No results for input(s): PROBNP in the last 8760 hours. HbA1C: No results for input(s): HGBA1C in the last 72 hours. CBG: No results for input(s): GLUCAP in the last 168 hours. Lipid Profile: Recent Labs    05/13/21 0553  CHOL 204*  HDL 75  LDLCALC 110*  TRIG 97  CHOLHDL 2.7   Thyroid Function Tests: No results for input(s): TSH, T4TOTAL, FREET4, T3FREE, THYROIDAB in the last 72 hours. Anemia Panel: No results for input(s): VITAMINB12, FOLATE, FERRITIN, TIBC, IRON, RETICCTPCT in the last 72 hours. Sepsis Labs: No results for input(s):  PROCALCITON, LATICACIDVEN in the last 168 hours.  Recent Results (from the past 240 hour(s))  Resp Panel by RT-PCR (Flu A&B, Covid) Nasopharyngeal Swab     Status: Abnormal   Collection Time: 05/12/21  1:46 PM   Specimen: Nasopharyngeal Swab; Nasopharyngeal(NP) swabs in vial transport medium  Result Value Ref Range Status   SARS Coronavirus 2 by RT PCR POSITIVE (A) NEGATIVE Final    Comment: CRITICAL RESULT CALLED TO, READ BACK BY AND VERIFIED WITH: CRAWFORD,H AT 1538 ON 9.12.22 BY RUCINSKI,B (NOTE) SARS-CoV-2 target nucleic acids are DETECTED.  The SARS-CoV-2 RNA is generally detectable in upper respiratory specimens during the acute phase of infection. Positive results are indicative of the presence of the identified virus, but do not rule out bacterial infection or co-infection with other pathogens not detected by the test. Clinical correlation with patient history and other diagnostic information is necessary to determine patient infection status. The expected result is Negative.  Fact Sheet for Patients: EntrepreneurPulse.com.au  Fact Sheet for Healthcare  Providers: IncredibleEmployment.be  This test is not yet approved or cleared by the [place] and  has been authorized for detection and/or diagnosis of SARS-CoV-2 by FDA under an Emergency Use Authorization (EUA).  This EUA will remain in effect (meani ng this test can be used) for the duration of  the COVID-19 declaration under Section 564(b)(1) of the Act, 21 U.S.C. section 360bbb-3(b)(1), unless the authorization is terminated or revoked sooner.     Influenza A by PCR NEGATIVE NEGATIVE Final   Influenza B by PCR NEGATIVE NEGATIVE Final    Comment: (NOTE) The Xpert Xpress SARS-CoV-2/FLU/RSV plus assay is intended as an aid in the diagnosis of influenza from Nasopharyngeal swab specimens and should not be used as a sole basis for treatment. Nasal washings and aspirates are  unacceptable for Xpert Xpress SARS-CoV-2/FLU/RSV testing.  Fact Sheet for Patients: EntrepreneurPulse.com.au  Fact Sheet for Healthcare Providers: IncredibleEmployment.be  This test is not yet approved or cleared by the [place] FDA and has been authorized for detection and/or diagnosis of SARS-CoV-2 by FDA under an Emergency Use Authorization (EUA). This EUA will remain in effect (meaning this test can be used) for the duration of the COVID-19 declaration under Section 564(b)(1) of the Act, 21 U.S.C. section 360bbb-3(b)(1), unless the authorization is terminated or revoked.  Performed at [facility], [address] [place], [place] [address]   MRSA Next Gen by PCR, Nasal     Status: Abnormal   Collection Time: 05/13/21  1:00 AM   Specimen: Nasal Mucosa; Nasal Swab  Result Value Ref Range Status   MRSA by PCR Next Gen DETECTED (A) NOT DETECTED Final    Comment: RESULT CALLED TO, READ BACK BY AND VERIFIED WITH: RN JESSICA WOLFE 05/13/21'@2'$ :17 BY TW (NOTE) The GeneXpert MRSA Assay (FDA approved for NASAL specimens only), is one component of a comprehensive MRSA colonization surveillance program. It is not intended to diagnose MRSA infection nor to guide or monitor treatment for MRSA infections. Test performance is not FDA approved in patients less than 14 years old. Performed at [facility], [place] [address] [place], [place] [address]   Expectorated Sputum Assessment w Gram Stain, Rflx to Resp Cult     Status: None   Collection Time: 05/13/21 10:00 AM   Specimen: Expectorated Sputum  Result Value Ref Range Status   Specimen Description EXPECTORATED SPUTUM  Final   Special Requests Immunocompromised  Final   Sputum evaluation   Final    THIS SPECIMEN IS ACCEPTABLE FOR SPUTUM CULTURE Performed at [facility], [place] [address] [place], [place] [address]    Report Status 05/13/2021 FINAL  Final  Culture, Respiratory w Gram  Stain     Status: None (Preliminary result)   Collection Time: 05/13/21 10:00 AM  Result Value Ref Range Status   Specimen Description EXPECTORATED SPUTUM  Final   Special Requests Immunocompromised Reflexed from [id number]  Final   Gram Stain   Final    ABUNDANT WBC PRESENT, PREDOMINANTLY PMN RARE GRAM POSITIVE COCCI IN PAIRS RARE GRAM NEGATIVE RODS    Culture   Final    FEW PSEUDOMONAS AERUGINOSA SUSCEPTIBILITIES TO FOLLOW Performed at [facility], [place] [address], [place],  [address]    Report Status PENDING  Incomplete         Radiology Studies: DG CHEST PORT 1 VIEW  Result Date: 05/13/2021 CLINICAL DATA:  Hypoxia.  COVID positive EXAM: PORTABLE CHEST 1 VIEW COMPARISON:  Yesterday FINDINGS: No  air bronchogram, edema, effusion, or pneumothorax. Evidence of small hiatal hernia. Normal heart size and mediastinal contours. IMPRESSION: No focal pneumonia or other change from yesterday. Electronically Signed   By: Jorje Guild M.D.   On: 05/13/2021 09:58   DG Chest Port 1 View  Result Date: 05/12/2021 CLINICAL DATA:  Shortness of breath EXAM: PORTABLE CHEST 1 VIEW COMPARISON:  Chest radiograph 08/29/2018 FINDINGS: The cardiomediastinal silhouette is normal. The lungs are hyperinflated consistent with COPD. There is no focal consolidation. There is no pulmonary edema. There is no pleural effusion or pneumothorax. There is biapical pleural scarring, unchanged. There is no acute osseous abnormality. IMPRESSION: Hyperinflated lungs consistent with COPD. No focal consolidation or pleural effusion. Electronically Signed   By: Valetta Mole M.D.   On: 05/12/2021 14:46   ECHOCARDIOGRAM COMPLETE  Result Date: 05/13/2021    ECHOCARDIOGRAM REPORT   Patient Name:   RAFAELITA GREEVER  Date of Exam: 05/13/2021 Medical Rec #:  [id number]  Height:       62.0 in Accession #:    [id number] Weight:       159.2 lb Date of Birth:  1946-06-03   BSA:          1.735 m Patient Age:    75 years   BP:            176/92 mmHg Patient Gender: F          HR:           55 bpm. Exam Location:  [facility] Procedure: 2D Echo, Cardiac Doppler and Color Doppler Indications:    NSTEMI  History:        Patient has prior history of Echocardiogram examinations, most                 recent 05/22/2009. CAD, COPD; Risk Factors:Hypertension, Current                 Smoker and Dyslipidemia. COVID +.  Sonographer:    Wenda Low Referring Phys: [place]  1. Left ventricular ejection fraction, by estimation, is 60 to 65%. The left ventricle has normal function. The left ventricle has no regional wall motion abnormalities. Left ventricular diastolic parameters are consistent with Grade I diastolic dysfunction (impaired relaxation).  2. Right ventricular systolic function is normal. The right ventricular size is normal. There is normal pulmonary artery systolic pressure.  3. The mitral valve is normal in structure. Trivial mitral valve regurgitation. No evidence of mitral stenosis.  4. The aortic valve is tricuspid. Aortic valve regurgitation is not visualized. No aortic stenosis is present.  5. The inferior vena cava is normal in size with greater than 50% respiratory variability, suggesting right atrial pressure of 3 mmHg. FINDINGS  Left Ventricle: Left ventricular ejection fraction, by estimation, is 60 to 65%. The left ventricle has normal function. The left ventricle has no regional wall motion abnormalities. The left ventricular internal cavity size was normal in size. There is  no left ventricular hypertrophy. Left ventricular diastolic parameters are consistent with Grade I diastolic dysfunction (impaired relaxation). Indeterminate filling pressures. Right Ventricle: The right ventricular size is normal. No increase in right ventricular wall thickness. Right ventricular systolic function is normal. There is normal pulmonary artery systolic pressure. The tricuspid regurgitant velocity is 1.62 m/s, and  with an  assumed right atrial pressure of 3 mmHg, the estimated right ventricular systolic pressure is [id number] mmHg. Left Atrium: Left atrial size was normal in size. Right Atrium:  Right atrial size was normal in size. Pericardium: There is no evidence of pericardial effusion. Mitral Valve: The mitral valve is normal in structure. Trivial mitral valve regurgitation. No evidence of mitral valve stenosis. MV peak gradient, 3.9 mmHg. The mean mitral valve gradient is 1.0 mmHg. Tricuspid Valve: The tricuspid valve is normal in structure. Tricuspid valve regurgitation is trivial. No evidence of tricuspid stenosis. Aortic Valve: The aortic valve is tricuspid. Aortic valve regurgitation is not visualized. No aortic stenosis is present. Aortic valve mean gradient measures 3.0 mmHg. Aortic valve peak gradient measures 8.6 mmHg. Aortic valve area, by VTI measures 2.18 cm. Pulmonic Valve: The pulmonic valve was normal in structure. Pulmonic valve regurgitation is not visualized. No evidence of pulmonic stenosis. Aorta: The aortic root is normal in size and structure. Venous: The inferior vena cava is normal in size with greater than 50% respiratory variability, suggesting right atrial pressure of 3 mmHg. IAS/Shunts: No atrial level shunt detected by color flow Doppler.  LEFT VENTRICLE PLAX 2D LVIDd:         4.60 cm     Diastology LVIDs:         2.80 cm     LV e' medial:    7.62 cm/s LV PW:         1.10 cm     LV E/e' medial:  7.0 LV IVS:        1.00 cm     LV e' lateral:   9.25 cm/s LVOT diam:     1.90 cm     LV E/e' lateral: 5.7 LV SV:         69 LV SV Index:   40 LVOT Area:     2.84 cm  LV Volumes (MOD) LV vol d, MOD A2C: 70.3 ml LV vol d, MOD A4C: 67.3 ml LV vol s, MOD A2C: 20.3 ml LV vol s, MOD A4C: 27.4 ml LV SV MOD A2C:     50.0 ml LV SV MOD A4C:     67.3 ml LV SV MOD BP:      46.6 ml RIGHT VENTRICLE RV Basal diam:  2.85 cm RV Mid diam:    2.30 cm RV S prime:     13.60 cm/s TAPSE (M-mode): 3.1 cm LEFT ATRIUM             Index        RIGHT ATRIUM           Index LA diam:        3.40 cm 1.96 cm/m  RA Area:     14.20 cm LA Vol (A2C):   48.9 ml 28.19 ml/m RA Volume:   37.50 ml  21.62 ml/m LA Vol (A4C):   42.6 ml 24.55 ml/m LA Biplane Vol: 45.7 ml 26.34 ml/m  AORTIC VALVE AV Area (Vmax):    2.04 cm AV Area (Vmean):   2.20 cm AV Area (VTI):     2.18 cm AV Vmax:           147.00 cm/s AV Vmean:          85.400 cm/s AV VTI:            0.318 m AV Peak Grad:      8.6 mmHg AV Mean Grad:      3.0 mmHg LVOT Vmax:         106.00 cm/s LVOT Vmean:        66.300 cm/s LVOT VTI:  0.245 m LVOT/AV VTI ratio: 0.77  AORTA Ao Root diam: 3.00 cm MITRAL VALVE               TRICUSPID VALVE MV Area (PHT): 3.87 cm    TR Peak grad:   10.5 mmHg MV Area VTI:   1.99 cm    TR Vmax:        161.80 cm/s MV Peak grad:  3.9 mmHg MV Mean grad:  1.0 mmHg    SHUNTS MV Vmax:       0.99 m/s    Systemic VTI:  0.24 m MV Vmean:      53.1 cm/s   Systemic Diam: 1.90 cm MV Decel Time: 196 msec MV E velocity: 52.98 cm/s MV A velocity: 81.40 cm/s MV E/A ratio:  0.65 Skeet Latch MD Electronically signed by Skeet Latch MD Signature Date/Time: 05/13/2021/2:08:39 PM    Final         Scheduled Meds:  aspirin EC  81 mg Oral Daily   Chlorhexidine Gluconate Cloth  6 each Topical Q0600   cholecalciferol  1,000 Units Oral Daily   clopidogrel  75 mg Oral Daily   enalapril  2.5 mg Oral Daily   enoxaparin (LOVENOX) injection  40 mg Subcutaneous QHS   hydrALAZINE  50 mg Oral Q6H   Ipratropium-Albuterol  1 puff Inhalation TID   levothyroxine  88 mcg Oral QAC breakfast   mouth rinse  15 mL Mouth Rinse BID   methylPREDNISolone (SOLU-MEDROL) injection  40 mg Intravenous Q12H   montelukast  10 mg Oral QHS   mupirocin ointment  1 application Nasal BID   pantoprazole  40 mg Oral BH-q7a   pravastatin  20 mg Oral BH-q7a   sertraline  150 mg Oral Daily   umeclidinium bromide  2 puff Inhalation BH-q7a   vitamin C  250 mg Oral Daily   zinc sulfate  220 mg Oral Daily    Continuous Infusions:  piperacillin-tazobactam (ZOSYN)  IV Stopped (05/14/21 1313)   remdesivir 100 mg in NS 100 mL 100 mg (05/14/21 1320)     LOS: 2 days    Time spent: 32 minutes    Barb Merino, MD [facility] Pager [phone]

## 2021-05-15 LAB — PHOSPHORUS: Phosphorus: 3.5 mg/dL (ref 2.5–4.6)

## 2021-05-15 LAB — CBC
HCT: 39.4 % (ref 36.0–46.0)
Hemoglobin: 13.2 g/dL (ref 12.0–15.0)
MCH: 30.5 pg (ref 26.0–34.0)
MCHC: 33.5 g/dL (ref 30.0–36.0)
MCV: 91 fL (ref 80.0–100.0)
Platelets: 392 10*3/uL (ref 150–400)
RBC: 4.33 MIL/uL (ref 3.87–5.11)
RDW: 14.8 % (ref 11.5–15.5)
WBC: 9.4 10*3/uL (ref 4.0–10.5)
nRBC: 0 % (ref 0.0–0.2)

## 2021-05-15 LAB — HEPATIC FUNCTION PANEL
ALT: 49 U/L — ABNORMAL HIGH (ref 0–44)
AST: 34 U/L (ref 15–41)
Albumin: 3 g/dL — ABNORMAL LOW (ref 3.5–5.0)
Alkaline Phosphatase: 92 U/L (ref 38–126)
Bilirubin, Direct: <0.1 mg/dL (ref 0.0–0.2)
Total Bilirubin: 0.6 mg/dL (ref 0.3–1.2)
Total Protein: 6.3 g/dL — ABNORMAL LOW (ref 6.5–8.1)

## 2021-05-15 LAB — D-DIMER, QUANTITATIVE: D-Dimer, Quant: 0.47 ug/mL-FEU (ref 0.00–0.50)

## 2021-05-15 LAB — CULTURE, RESPIRATORY W GRAM STAIN

## 2021-05-15 LAB — MAGNESIUM: Magnesium: 2 mg/dL (ref 1.7–2.4)

## 2021-05-15 LAB — C-REACTIVE PROTEIN: CRP: 0.8 mg/dL (ref <1.0)

## 2021-05-15 NOTE — Progress Notes (Signed)
PROGRESS NOTE    Alexa Orozco  [id number] DOB: 10/17/1945 DOA: 05/12/2021 PCP: Susy Frizzle, MD    Brief Narrative:  Patient with history of COPD, coronary artery disease, GERD, hypothyroidism, bladder cancer status post immunotherapy, peripheral vascular disease hypertension and smoker came to the emergency room with fever cough and congestion for about 1 week.  Treated for COPD exacerbation with Z-Pak as outpatient.  Negative COVID test at home.  In the emergency room she was hypoxic with oxygen saturation of 80 and is started on 4 L oxygen.  Troponin elevated to 900.  Patient was started on heparin drip and admitted to the hospital.   Assessment & Plan:   Principal Problem:   NSTEMI (non-ST elevated myocardial infarction) ([employer]) Active Problems:   Essential hypertension   Hypothyroid   COPD (chronic obstructive pulmonary disease) (HCC)   Tobacco abuse   CAD S/P percutaneous coronary angioplasty   Dyslipidemia   Claudication of left lower extremity (HCC)   Depression   Generalized anxiety disorder   Bladder cancer ([place])   COVID-19 virus infection   Acute on chronic respiratory failure with hypoxia ([place])  COVID-19 infection with COPD exacerbation, acute hypoxemic respiratory failure: Continue to monitor due to significant symptoms  chest physiotherapy, incentive spirometry, deep breathing exercises, sputum induction, mucolytic's and bronchodilators. Supplemental oxygen to keep saturations more than 90%.   Covid directed therapy with , steroids, Solu-Medrol 40 mg twice daily. remdesivir day 3/5 antibiotics, treating for COPD exacerbation with Zosyn.   Sputum cultures with Pseudomonas.  Probably colonized with underlying history of COPD.  Allergy to fluoroquinolone and cephalosporins.   Will treat with Zosyn for 1 more day and discontinue antibiotics.   Will need assessment of home oxygen today.    Elevated troponins, suspected demand ischemia in the setting of COVID-19  viral infection: Troponin peaked at 905 and downtrending.  Treated with heparin, aspirin and Plavix.  2D echocardiogram was essentially normal.  Chest pain-free.  Cardiology recommended conservative management. Aspirin and Plavix.  Pravastatin.  Beta-blockers.  Discontinued heparin.  Essential hypertension: Blood pressure elevated on presentation.  Today better on enalapril and hydralazine.  Chronic anxiety depression: On Zoloft at home.  Stable.  Smoker: Counseled to quit.  Nicotine patch.  Continue to mobilize.  IV antibiotics and remdesivir today.  Oxygen assessment for home.  Possible discharge tomorrow.   DVT prophylaxis: enoxaparin (LOVENOX) injection 40 mg Start: 05/14/21 2200   Code Status: Full code Family Communication: None today. Disposition Plan: Status is: Inpatient  Remains inpatient appropriate because:Inpatient level of care appropriate due to severity of illness  Dispo: The patient is from: Home              Anticipated d/c is to: Home              Patient currently is not medically stable to d/c.   Difficult to place patient No         Consultants:  Cardiology  Procedures:  None  Antimicrobials:  Remdesivir 9/13--- Vancomycin 9/13-9/14 Zosyn 9/13----   Subjective: Patient seen and examined.  Using 2 L oxygen at rest.  Coughing with some mucoid sputum.  Afebrile.  Was wondering about going home and laying on his on couch. She is ready to quit his smoking.  She is agreeable to use oxygen at home if needed. Objective: Vitals:   05/15/21 0315 05/15/21 0621 05/15/21 0739 05/15/21 0954  BP: 131/64 128/74 (!) 114/56 129/67  Pulse: 61  61   Resp:  20  20   Temp: 98.2 F (36.8 C)  98.7 F (37.1 C)   TempSrc: Oral  Oral   SpO2: 96%  99%   Weight:      Height:        Intake/Output Summary (Last 24 hours) at 05/15/2021 1050 Last data filed at 05/15/2021 0901 Gross per 24 hour  Intake 967.1 ml  Output 850 ml  Net 117.1 ml   Filed Weights    05/12/21 1319 05/13/21 0037  Weight: 73.4 kg 72.2 kg    Examination:  General exam: Appears calm and comfortable  Alert and awake.  SpO2: 96 % O2 Flow Rate (L/min): 2.5 L/min FiO2 (%): 40 %  Respiratory system: Occasional expiratory wheezes.  Upper airway conducted sounds. Cardiovascular system: S1 & S2 heard, RRR. No JVD, murmurs, rubs, gallops or clicks. No pedal edema. Gastrointestinal system: Soft.  Nontender.  Bowel sounds present.   Central nervous system: Alert and oriented.   Data Reviewed: I have personally reviewed following labs and imaging studies  CBC: Recent Labs  Lab 05/12/21 1353 05/14/21 0107 05/15/21 0128  WBC 8.6 7.5 9.4  NEUTROABS 7.3  --   --   HGB 14.9 13.5 13.2  HCT 44.7 40.2 39.4  MCV 93.1 91.6 91.0  PLT 378 379 0000000   Basic Metabolic Panel: Recent Labs  Lab 05/12/21 1353 05/14/21 0107 05/15/21 0128  NA 137 136  --   K 3.9 4.3  --   CL 105 103  --   CO2 23 23  --   GLUCOSE 137* 133*  --   BUN 17 14  --   CREATININE 0.79 0.74  --   CALCIUM 9.0 8.9  --   MG  --   --  2.0  PHOS  --   --  3.5   GFR: Estimated Creatinine Clearance: 58.2 mL/min (by C-G formula based on SCr of 0.74 mg/dL). Liver Function Tests: Recent Labs  Lab 05/12/21 1353 05/14/21 0107 05/15/21 0128  AST 45* 52* 34  ALT 50* 57* 49*  ALKPHOS 123 94 92  BILITOT 0.5 0.7 0.6  PROT 8.1 6.4* 6.3*  ALBUMIN 4.2 3.1* 3.0*   No results for input(s): LIPASE, AMYLASE in the last 168 hours. No results for input(s): AMMONIA in the last 168 hours. Coagulation Profile: Recent Labs  Lab 05/12/21 1353  INR 0.9   Cardiac Enzymes: No results for input(s): CKTOTAL, CKMB, CKMBINDEX, TROPONINI in the last 168 hours. BNP (last 3 results) No results for input(s): PROBNP in the last 8760 hours. HbA1C: No results for input(s): HGBA1C in the last 72 hours. CBG: No results for input(s): GLUCAP in the last 168 hours. Lipid Profile: Recent Labs    05/13/21 0553  CHOL 204*  HDL 75   LDLCALC 110*  TRIG 97  CHOLHDL 2.7   Thyroid Function Tests: No results for input(s): TSH, T4TOTAL, FREET4, T3FREE, THYROIDAB in the last 72 hours. Anemia Panel: No results for input(s): VITAMINB12, FOLATE, FERRITIN, TIBC, IRON, RETICCTPCT in the last 72 hours. Sepsis Labs: No results for input(s): PROCALCITON, LATICACIDVEN in the last 168 hours.  Recent Results (from the past 240 hour(s))  Resp Panel by RT-PCR (Flu A&B, Covid) Nasopharyngeal Swab     Status: Abnormal   Collection Time: 05/12/21  1:46 PM   Specimen: Nasopharyngeal Swab; Nasopharyngeal(NP) swabs in vial transport medium  Result Value Ref Range Status   SARS Coronavirus 2 by RT PCR POSITIVE (A) NEGATIVE Final    Comment: CRITICAL RESULT  CALLED TO, READ BACK BY AND VERIFIED WITH: CRAWFORD,H AT 1538 ON 9.12.22 BY RUCINSKI,B (NOTE) SARS-CoV-2 target nucleic acids are DETECTED.  The SARS-CoV-2 RNA is generally detectable in upper respiratory specimens during the acute phase of infection. Positive results are indicative of the presence of the identified virus, but do not rule out bacterial infection or co-infection with other pathogens not detected by the test. Clinical correlation with patient history and other diagnostic information is necessary to determine patient infection status. The expected result is Negative.  Fact Sheet for Patients: EntrepreneurPulse.com.au  Fact Sheet for Healthcare Providers: IncredibleEmployment.be  This test is not yet approved or cleared by the [place] FDA and  has been authorized for detection and/or diagnosis of SARS-CoV-2 by FDA under an Emergency Use Authorization (EUA).  This EUA will remain in effect (meani ng this test can be used) for the duration of  the COVID-19 declaration under Section 564(b)(1) of the Act, 21 U.S.C. section 360bbb-3(b)(1), unless the authorization is terminated or revoked sooner.     Influenza A by PCR  NEGATIVE NEGATIVE Final   Influenza B by PCR NEGATIVE NEGATIVE Final    Comment: (NOTE) The Xpert Xpress SARS-CoV-2/FLU/RSV plus assay is intended as an aid in the diagnosis of influenza from Nasopharyngeal swab specimens and should not be used as a sole basis for treatment. Nasal washings and aspirates are unacceptable for Xpert Xpress SARS-CoV-2/FLU/RSV testing.  Fact Sheet for Patients: EntrepreneurPulse.com.au  Fact Sheet for Healthcare Providers: IncredibleEmployment.be  This test is not yet approved or cleared by the [place] FDA and has been authorized for detection and/or diagnosis of SARS-CoV-2 by FDA under an Emergency Use Authorization (EUA). This EUA will remain in effect (meaning this test can be used) for the duration of the COVID-19 declaration under Section 564(b)(1) of the Act, 21 U.S.C. section 360bbb-3(b)(1), unless the authorization is terminated or revoked.  Performed at [facility], [address] [place], [place] [address]   MRSA Next Gen by PCR, Nasal     Status: Abnormal   Collection Time: 05/13/21  1:00 AM   Specimen: Nasal Mucosa; Nasal Swab  Result Value Ref Range Status   MRSA by PCR Next Gen DETECTED (A) NOT DETECTED Final    Comment: RESULT CALLED TO, READ BACK BY AND VERIFIED WITH: RN JESSICA WOLFE 05/13/21'@2'$ :17 BY TW (NOTE) The GeneXpert MRSA Assay (FDA approved for NASAL specimens only), is one component of a comprehensive MRSA colonization surveillance program. It is not intended to diagnose MRSA infection nor to guide or monitor treatment for MRSA infections. Test performance is not FDA approved in patients less than 78 years old. Performed at [facility], [employer] [address] [place], [place] [address]   Expectorated Sputum Assessment w Gram Stain, Rflx to Resp Cult     Status: None   Collection Time: 05/13/21 10:00 AM   Specimen: Expectorated Sputum  Result Value Ref Range Status   Specimen  Description EXPECTORATED SPUTUM  Final   Special Requests Immunocompromised  Final   Sputum evaluation   Final    THIS SPECIMEN IS ACCEPTABLE FOR SPUTUM CULTURE Performed at [facility], [place] [address], [place], [place] [address]    Report Status 05/13/2021 FINAL  Final  Culture, Respiratory w Gram Stain     Status: None   Collection Time: 05/13/21 10:00 AM  Result Value Ref Range Status   Specimen Description EXPECTORATED SPUTUM  Final   Special Requests Immunocompromised Reflexed from [id number]  Final  Gram Stain   Final    ABUNDANT WBC PRESENT, PREDOMINANTLY PMN RARE GRAM POSITIVE COCCI IN PAIRS RARE GRAM NEGATIVE RODS Performed at [facility], [place] [address] [place], [place] [address]    Culture FEW PSEUDOMONAS AERUGINOSA  Final   Report Status 05/15/2021 FINAL  Final   Organism ID, Bacteria PSEUDOMONAS AERUGINOSA  Final      Susceptibility   Pseudomonas aeruginosa - MIC*    CEFTAZIDIME 4 SENSITIVE Sensitive     CIPROFLOXACIN <=0.25 SENSITIVE Sensitive     GENTAMICIN <=1 SENSITIVE Sensitive     IMIPENEM 2 SENSITIVE Sensitive     PIP/TAZO 16 SENSITIVE Sensitive     CEFEPIME 2 SENSITIVE Sensitive     * FEW PSEUDOMONAS AERUGINOSA         Radiology Studies: ECHOCARDIOGRAM COMPLETE  Result Date: 05/13/2021    ECHOCARDIOGRAM REPORT   Patient Name:   CARLECIA CORADO  Date of Exam: 05/13/2021 Medical Rec #:  [id number]  Height:       62.0 in Accession #:    [id number] Weight:       159.2 lb Date of Birth:  10-26-1945   BSA:          1.735 m Patient Age:    75 years   BP:           176/92 mmHg Patient Gender: F          HR:           55 bpm. Exam Location:  [facility] Procedure: 2D Echo, Cardiac Doppler and Color Doppler Indications:    NSTEMI  History:        Patient has prior history of Echocardiogram examinations, most                 recent 05/22/2009. CAD, COPD; Risk Factors:Hypertension, Current                 Smoker and Dyslipidemia. COVID +.  Sonographer:    Wenda Low Referring Phys: [place]  1. Left ventricular ejection fraction, by estimation, is 60 to 65%. The left ventricle has normal function. The left ventricle has no regional wall motion abnormalities. Left ventricular diastolic parameters are consistent with Grade I diastolic dysfunction (impaired relaxation).  2. Right ventricular systolic function is normal. The right ventricular size is normal. There is normal pulmonary artery systolic pressure.  3. The mitral valve is normal in structure. Trivial mitral valve regurgitation. No evidence of mitral stenosis.  4. The aortic valve is tricuspid. Aortic valve regurgitation is not visualized. No aortic stenosis is present.  5. The inferior vena cava is normal in size with greater than 50% respiratory variability, suggesting right atrial pressure of 3 mmHg. FINDINGS  Left Ventricle: Left ventricular ejection fraction, by estimation, is 60 to 65%. The left ventricle has normal function. The left ventricle has no regional wall motion abnormalities. The left ventricular internal cavity size was normal in size. There is  no left ventricular hypertrophy. Left ventricular diastolic parameters are consistent with Grade I diastolic dysfunction (impaired relaxation). Indeterminate filling pressures. Right Ventricle: The right ventricular size is normal. No increase in right ventricular wall thickness. Right ventricular systolic function is normal. There is normal pulmonary artery systolic pressure. The tricuspid regurgitant velocity is 1.62 m/s, and  with an assumed right atrial pressure of 3 mmHg, the estimated right ventricular systolic pressure is [id number] mmHg. Left Atrium: Left atrial size was normal in size. Right  Atrium: Right atrial size was normal in size. Pericardium: There is no evidence of pericardial effusion. Mitral Valve: The mitral valve is normal in structure. Trivial mitral valve regurgitation. No evidence of mitral valve stenosis. MV peak  gradient, 3.9 mmHg. The mean mitral valve gradient is 1.0 mmHg. Tricuspid Valve: The tricuspid valve is normal in structure. Tricuspid valve regurgitation is trivial. No evidence of tricuspid stenosis. Aortic Valve: The aortic valve is tricuspid. Aortic valve regurgitation is not visualized. No aortic stenosis is present. Aortic valve mean gradient measures 3.0 mmHg. Aortic valve peak gradient measures 8.6 mmHg. Aortic valve area, by VTI measures 2.18 cm. Pulmonic Valve: The pulmonic valve was normal in structure. Pulmonic valve regurgitation is not visualized. No evidence of pulmonic stenosis. Aorta: The aortic root is normal in size and structure. Venous: The inferior vena cava is normal in size with greater than 50% respiratory variability, suggesting right atrial pressure of 3 mmHg. IAS/Shunts: No atrial level shunt detected by color flow Doppler.  LEFT VENTRICLE PLAX 2D LVIDd:         4.60 cm     Diastology LVIDs:         2.80 cm     LV e' medial:    7.62 cm/s LV PW:         1.10 cm     LV E/e' medial:  7.0 LV IVS:        1.00 cm     LV e' lateral:   9.25 cm/s LVOT diam:     1.90 cm     LV E/e' lateral: 5.7 LV SV:         69 LV SV Index:   40 LVOT Area:     2.84 cm  LV Volumes (MOD) LV vol d, MOD A2C: 70.3 ml LV vol d, MOD A4C: 67.3 ml LV vol s, MOD A2C: 20.3 ml LV vol s, MOD A4C: 27.4 ml LV SV MOD A2C:     50.0 ml LV SV MOD A4C:     67.3 ml LV SV MOD BP:      46.6 ml RIGHT VENTRICLE RV Basal diam:  2.85 cm RV Mid diam:    2.30 cm RV S prime:     13.60 cm/s TAPSE (M-mode): 3.1 cm LEFT ATRIUM             Index       RIGHT ATRIUM           Index LA diam:        3.40 cm 1.96 cm/m  RA Area:     14.20 cm LA Vol (A2C):   48.9 ml 28.19 ml/m RA Volume:   37.50 ml  21.62 ml/m LA Vol (A4C):   42.6 ml 24.55 ml/m LA Biplane Vol: 45.7 ml 26.34 ml/m  AORTIC VALVE AV Area (Vmax):    2.04 cm AV Area (Vmean):   2.20 cm AV Area (VTI):     2.18 cm AV Vmax:           147.00 cm/s AV Vmean:          85.400 cm/s AV VTI:             0.318 m AV Peak Grad:      8.6 mmHg AV Mean Grad:      3.0 mmHg LVOT Vmax:         106.00 cm/s LVOT Vmean:        66.300 cm/s LVOT VTI:  0.245 m LVOT/AV VTI ratio: 0.77  AORTA Ao Root diam: 3.00 cm MITRAL VALVE               TRICUSPID VALVE MV Area (PHT): 3.87 cm    TR Peak grad:   10.5 mmHg MV Area VTI:   1.99 cm    TR Vmax:        161.80 cm/s MV Peak grad:  3.9 mmHg MV Mean grad:  1.0 mmHg    SHUNTS MV Vmax:       0.99 m/s    Systemic VTI:  0.24 m MV Vmean:      53.1 cm/s   Systemic Diam: 1.90 cm MV Decel Time: 196 msec MV E velocity: 52.98 cm/s MV A velocity: 81.40 cm/s MV E/A ratio:  0.65 Skeet Latch MD Electronically signed by Skeet Latch MD Signature Date/Time: 05/13/2021/2:08:39 PM    Final         Scheduled Meds:  aspirin EC  81 mg Oral Daily   Chlorhexidine Gluconate Cloth  6 each Topical Q0600   cholecalciferol  1,000 Units Oral Daily   clopidogrel  75 mg Oral Daily   enalapril  2.5 mg Oral Daily   enoxaparin (LOVENOX) injection  40 mg Subcutaneous QHS   hydrALAZINE  50 mg Oral Q6H   Ipratropium-Albuterol  1 puff Inhalation TID   levothyroxine  88 mcg Oral QAC breakfast   mouth rinse  15 mL Mouth Rinse BID   methylPREDNISolone (SOLU-MEDROL) injection  40 mg Intravenous Q12H   montelukast  10 mg Oral QHS   mupirocin ointment  1 application Nasal BID   pantoprazole  40 mg Oral BH-q7a   pravastatin  20 mg Oral BH-q7a   sertraline  150 mg Oral Daily   umeclidinium bromide  2 puff Inhalation BH-q7a   vitamin C  250 mg Oral Daily   zinc sulfate  220 mg Oral Daily   Continuous Infusions:  piperacillin-tazobactam (ZOSYN)  IV 3.375 g (05/15/21 0312)   remdesivir 100 mg in NS 100 mL 100 mg (05/15/21 1001)     LOS: 3 days    Time spent: 32 minutes    Barb Merino, MD [facility] Pager [phone]

## 2021-05-15 NOTE — Plan of Care (Signed)
  Problem: Clinical Measurements: Goal: Respiratory complications will improve Outcome: Not Progressing   Problem: Activity: Goal: Risk for activity intolerance will decrease Outcome: Not Progressing   

## 2021-05-15 NOTE — Progress Notes (Addendum)
SATURATION QUALIFICATIONS:   Patient Saturations on Room Air at Rest = 95%  Patient Saturations on Room Air while Ambulating =79%  Patient Saturations on 3 Liters of oxygen while Ambulating = 95 %

## 2021-05-15 NOTE — Plan of Care (Signed)

## 2021-05-16 MED ORDER — PREDNISONE 20 MG PO TABS: ORAL_TABLET | ORAL | 0 refills | Status: DC

## 2021-05-16 MED ORDER — HYDRALAZINE HCL 50 MG PO TABS: 50.0000 mg | ORAL_TABLET | Freq: Four times a day (QID) | ORAL | 0 refills | Status: DC

## 2021-05-16 MED ORDER — ENALAPRIL MALEATE 2.5 MG PO TABS: 2.5000 mg | ORAL_TABLET | Freq: Every day | ORAL | 0 refills | Status: DC

## 2021-05-16 MED ORDER — ASPIRIN 81 MG PO TBEC: 81.0000 mg | DELAYED_RELEASE_TABLET | Freq: Every day | ORAL | 11 refills | Status: DC

## 2021-05-16 NOTE — Plan of Care (Signed)

## 2021-05-16 NOTE — TOC Transition Note (Addendum)
Transition of Care [facility]) - CM/SW Discharge Note   Patient Details  Name: Brunilda Montierth MRN: [id number] Date of Birth: 08-17-1946  [facility] CM/SW Contact:  Zenon Mayo, RN Phone Number: 05/16/2021, 12:34 PM   Clinical Narrative:    Patient is for dc home, physical therapy checked sats and her sats  does not qualify for home oxygen.  Per Staff RN patient began getting sob, she will check oxygen saturations again.  Per Staff RN she rechecked sats and patient sats remained above 95%.    Final next level of care: Home/Self Care Barriers to Discharge: No Barriers Identified   Patient Goals and CMS Choice Patient states their goals for this hospitalization and ongoing recovery are:: return home      Discharge Placement                       Discharge Plan and Services                  DME Agency: NA       HH Arranged: NA          Social Determinants of Health (SDOH) Interventions     Readmission Risk Interventions No flowsheet data found.

## 2021-05-16 NOTE — Progress Notes (Signed)
Occupational Therapy Treatment Patient Details Name: Alexa Orozco MRN: [id number] DOB: 1945-11-26 Today's Date: 05/16/2021   History of present illness Pt is a 75 y.o. female admitted 05/12/10 with c/o progressive SOB, cough, congestion, chest pressure; pt tested (+) COVID-19. Workup for elevated troponin, suspect either NSTEMI or demand ischemia secondary to COVID. PMH includes CAD, asthma, emphysema, HTN, COPD, bladder CA, osteoporosis, Raynaud's.   OT comments  Patient in recliner and asked to change into regular clothing to prepare for going home this afternoon.  Patient transferred to eob and performed dressing with assistance due to equipment. Reviewed energy conservation handout and patient was able to recall 3 energy conservation techniques. Patient is expected to return home with son.    Recommendations for follow up therapy are one component of a multi-disciplinary discharge planning process, led by the attending physician.  Recommendations may be updated based on patient status, additional functional criteria and insurance authorization.    Follow Up Recommendations  Supervision - Intermittent;No OT follow up    Equipment Recommendations  3 in 1 bedside commode    Recommendations for Other Services      Precautions / Restrictions Precautions Precautions: Fall Precaution Comments: 3L O2 Restrictions Weight Bearing Restrictions: No       Mobility Bed Mobility Overal bed mobility: Independent                  Transfers Overall transfer level: Needs assistance Equipment used: None Transfers: Sit to/from Stand;Stand Pivot Transfers Sit to Stand: Supervision Stand pivot transfers: Supervision            Balance Overall balance assessment: Needs assistance   Sitting balance-Leahy Scale: Good Sitting balance - Comments: sat on eob for dressing   Standing balance support: No upper extremity supported Standing balance-Leahy Scale: Fair                              ADL either performed or assessed with clinical judgement   ADL Overall ADL's : Needs assistance/impaired                 Upper Body Dressing : Independent Upper Body Dressing Details (indicate cue type and reason): donned T-shirt with assistance for equipment Lower Body Dressing: Independent Lower Body Dressing Details (indicate cue type and reason): donned pants seated on eob               General ADL Comments: performed seated on eob to prepare for going home     Vision       Perception     Praxis      Cognition Arousal/Alertness: Awake/alert Behavior During Therapy: [facility] for tasks assessed/performed;Flat affect Overall Cognitive Status: Within Functional Limits for tasks assessed                                 General Comments: able to recall 3/3 energy conservation techniques        Exercises     Shoulder Instructions       General Comments addressed energy conservation    Pertinent Vitals/ Pain       Pain Assessment: No/denies pain  Home Living  Prior Functioning/Environment              Frequency  Min 2X/week        Progress Toward Goals  OT Goals(current goals can now be found in the care plan section)  Progress towards OT goals: Progressing toward goals  Acute Rehab OT Goals Patient Stated Goal: be able to go back home OT Goal Formulation: With patient Time For Goal Achievement: 05/28/21 Potential to Achieve Goals: Good ADL Goals Pt Will Transfer to Toilet: Independently Pt Will Perform Toileting - Clothing Manipulation and hygiene: Independently Additional ADL Goal #1: pt will be able to report 3 ECTs to use for ADL tasks with 0 cues Additional ADL Goal #2: pt will be indep with morning ADL routine with utilization of ECTs appropriately  Plan Discharge plan remains appropriate    Co-evaluation                 AM-PAC OT "6  Clicks" Daily Activity     Outcome Measure   Help from another person eating meals?: None Help from another person taking care of personal grooming?: None Help from another person toileting, which includes using toliet, bedpan, or urinal?: A Little Help from another person bathing (including washing, rinsing, drying)?: A Little Help from another person to put on and taking off regular upper body clothing?: A Little Help from another person to put on and taking off regular lower body clothing?: A Little 6 Click Score: 20    End of Session    OT Visit Diagnosis: Unsteadiness on feet (R26.81);Muscle weakness (generalized) (M62.81)   Activity Tolerance Patient tolerated treatment well   Patient Left in bed;with call bell/phone within reach   Nurse Communication Mobility status        Time: [id number] OT Time Calculation (min): 20 min  Charges: OT General Charges $OT Visit: 1 Visit OT Treatments $Self Care/Home Management : 8-22 mins  Lodema Hong, OTA   Staci Carver Alexis Goodell 05/16/2021, 1:15 PM

## 2021-05-16 NOTE — Discharge Summary (Signed)
Physician Discharge Summary  Alexa Orozco [id number] DOB: [birth year] DOA: 05/12/2021  PCP: Susy Frizzle, MD  Admit date: 05/12/2021 Discharge date: 05/16/2021  Admitted From: Home Disposition: Home  Recommendations for Outpatient Follow-up:  Follow up with PCP in 1-2 weeks Cardiology will schedule follow-up.  Home Health: Not needed Equipment/Devices: Oxygen on ambulation.  3 L/min  Discharge Condition: Stable CODE STATUS: Full code Diet recommendation: Low-salt diet  Discharge summary: Patient with history of COPD not on home oxygen, coronary artery disease, GERD, hypothyroidism, bladder cancer status post immunotherapy, peripheral vascular disease hypertension and smoker came to the emergency room with fever cough and congestion for about 1 week.  Treated for COPD exacerbation with Z-Pak as outpatient.  Negative COVID test at home.  In the emergency room she was hypoxic with oxygen saturation of 80 and started on 4 L oxygen.  Troponin elevated to 900.  Patient was started on heparin drip and admitted to the hospital.  COVID-19 was positive.     COVID-19 infection causing COPD exacerbation and acute hypoxemic respiratory failure. Patient was admitted to hospital with significant symptoms.  She was treated with aggressive bronchodilator therapy, supplemental oxygen and chest physiotherapy.  She received remdesivir for 4 days.  She received IV Zosyn for 4 days.  Blood cultures negative.  Sputum cultures with rare Pseudomonas.  Afebrile.  Sputum is mucoid in color.  Receiving Solu-Medrol IV in the hospital.  With adequate clinical stabilization she will like to go home.  Completed antibiotic therapy, she has few doses of azithromycin left at home that she will finish.  Will repeat prednisone taper, taper over the next 6 days.  On ambulation, her oxygen dropped less than 90% which is probably her chronic hypoxemia from COPD.  Will prescribe ambulatory oxygen.  Extensive smoking  cessation counseling done and patient is agreeable to stopping smoking.   Standard isolation protocol for 2 weeks at home.   Encouraged patient to take COVID-19 vaccinations for prevention in the future, she can start taking vaccinations after 1 month and when she has improvement from current symptoms.  Elevated troponins, suspected demand ischemia in the setting of COVID-19 viral infection: Troponin peaked at 905 and downtrending.  Treated with heparin, aspirin and Plavix.  2D echocardiogram was essentially normal.  Chest pain-free.  Cardiology recommended conservative management. Aspirin and Plavix.  Pravastatin.  Beta-blockers.   Patient was on Plavix at home.  Aspirin was added.  Prescribed.   Essential hypertension: Blood pressure elevated on presentation. Started on enalapril and hydralazine, will be prescribed.   Chronic anxiety depression: On Zoloft at home.  Stable.   Smoker: Counseled to quit.  Nicotine patch if she wishes to use over-the-counter.  Medically stabilized to discharge home.  No mobility problems.  She will need oxygen at home.  Discharge Diagnoses:  Principal Problem:   NSTEMI (non-ST elevated myocardial infarction) ([place]) Active Problems:   Essential hypertension   Hypothyroid   COPD (chronic obstructive pulmonary disease) (HCC)   Tobacco abuse   CAD S/P percutaneous coronary angioplasty   Dyslipidemia   Claudication of left lower extremity (HCC)   Depression   Generalized anxiety disorder   Bladder cancer ([place])   COVID-19 virus infection   Acute on chronic respiratory failure with hypoxia [facility])    Discharge Instructions  Discharge Instructions     Call MD for:  difficulty breathing, headache or visual disturbances   Complete by: As directed    Call MD for:  temperature >100.4   Complete by:  As directed    Diet - low sodium heart healthy   Complete by: As directed    Discharge instructions   Complete by: As directed    Can use over-the-counter  cough medications and Tylenol. Continue to do breathing exercises at home.   Increase activity slowly   Complete by: As directed       Allergies as of 05/16/2021       Reactions   Bystolic [nebivolol Hcl] Hives   Cephalexin Hives   Effexor [venlafaxine] Other (See Comments)   Made her face hot and red   Levaquin [levofloxacin In D5w] Other (See Comments)   Caused face to turn red and patient became extremely hot         Medication List     STOP taking these medications    clonazePAM 0.5 MG tablet Commonly known as: KLONOPIN   tiZANidine 4 MG tablet Commonly known as: Zanaflex   Trospium Chloride 60 MG Cp24       TAKE these medications    acetaminophen 500 MG tablet Commonly known as: TYLENOL Take 500-1,000 mg by mouth every 6 (six) hours as needed (pain).   albuterol (2.5 MG/3ML) 0.083% nebulizer solution Commonly known as: PROVENTIL Take 3 mLs (2.5 mg total) by nebulization every 6 (six) hours as needed for wheezing or shortness of breath.   albuterol 108 (90 Base) MCG/ACT inhaler Commonly known as: VENTOLIN HFA Inhale 2 puffs into the lungs every 6 (six) hours as needed for wheezing or shortness of breath. Patients insurance requires CFC-free aerosol.  Please dispense only that   aspirin 81 MG EC tablet Take 1 tablet (81 mg total) by mouth daily. Swallow whole.   azithromycin 250 MG tablet Commonly known as: ZITHROMAX 2 tabs poqday1, 1 tab poqday 2-5   clopidogrel 75 MG tablet Commonly known as: PLAVIX Take 1 tablet (75 mg total) by mouth daily.   cromolyn 4 % ophthalmic solution Commonly known as: OPTICROM Place 1 drop into both eyes 4 (four) times daily.   enalapril 2.5 MG tablet Commonly known as: VASOTEC Take 1 tablet (2.5 mg total) by mouth daily.   Eszopiclone 3 MG Tabs TAKE 1 TABLET BY MOUTH IMMEDIATELY BEFORE BEDTIME What changed: See the new instructions.   hydrALAZINE 50 MG tablet Commonly known as: APRESOLINE Take 1 tablet (50 mg  total) by mouth every 6 (six) hours.   levothyroxine 88 MCG tablet Commonly known as: SYNTHROID Take 1 tablet (88 mcg total) by mouth daily before breakfast.   meclizine 25 MG tablet Commonly known as: ANTIVERT Take 0.5 tablets (12.5 mg total) by mouth 3 (three) times daily as needed for dizziness.   montelukast 10 MG tablet Commonly known as: SINGULAIR TAKE 1 TABLET BY MOUTH EVERYDAY AT BEDTIME   nitroGLYCERIN 0.4 MG SL tablet Commonly known as: NITROSTAT Place 1 tablet (0.4 mg total) under the tongue every 5 (five) minutes as needed for chest pain.   pantoprazole 40 MG tablet Commonly known as: PROTONIX Take 1 tablet (40 mg total) by mouth every morning.   pravastatin 20 MG tablet Commonly known as: PRAVACHOL Take 1 tablet (20 mg total) by mouth every morning.   predniSONE 20 MG tablet Commonly known as: DELTASONE 3 tabs daily for 2 days 2 tabs daily for 2 days  1 tab daily for 2 days What changed: additional instructions   sertraline 100 MG tablet Commonly known as: ZOLOFT Take 1.5 tablets (150 mg total) by mouth daily.   Spiriva Respimat 2.5 MCG/ACT  Aers Generic drug: Tiotropium Bromide Monohydrate Inhale 2 puffs into the lungs every morning.               Durable Medical Equipment  (From admission, onward)           Start     Ordered   05/15/21 1321  For home use only DME oxygen  Once       Question Answer Comment  Length of Need Lifetime   Mode or (Route) Nasal cannula   Liters per Minute 3   Frequency Continuous (stationary and portable oxygen unit needed)   Oxygen conserving device Yes   Oxygen delivery system Gas      05/15/21 1320            Follow-up Information     Susy Frizzle, MD Follow up.   Specialty: Family Medicine Contact information: [address] [place] [address] [phone]                Allergies  Allergen Reactions   Bystolic [Nebivolol Hcl] Hives   Cephalexin Hives   Effexor  [Venlafaxine] Other (See Comments)    Made her face hot and red   Levaquin [Levofloxacin In D5w] Other (See Comments)    Caused face to turn red and patient became extremely hot     Consultations: Cardiology   Procedures/Studies: DG CHEST PORT 1 VIEW  Result Date: 05/13/2021 CLINICAL DATA:  Hypoxia.  COVID positive EXAM: PORTABLE CHEST 1 VIEW COMPARISON:  Yesterday FINDINGS: No air bronchogram, edema, effusion, or pneumothorax. Evidence of small hiatal hernia. Normal heart size and mediastinal contours. IMPRESSION: No focal pneumonia or other change from yesterday. Electronically Signed   By: Jorje Guild M.D.   On: 05/13/2021 09:58   DG Chest Port 1 View  Result Date: 05/12/2021 CLINICAL DATA:  Shortness of breath EXAM: PORTABLE CHEST 1 VIEW COMPARISON:  Chest radiograph 08/29/2018 FINDINGS: The cardiomediastinal silhouette is normal. The lungs are hyperinflated consistent with COPD. There is no focal consolidation. There is no pulmonary edema. There is no pleural effusion or pneumothorax. There is biapical pleural scarring, unchanged. There is no acute osseous abnormality. IMPRESSION: Hyperinflated lungs consistent with COPD. No focal consolidation or pleural effusion. Electronically Signed   By: Valetta Mole M.D.   On: 05/12/2021 14:46   ECHOCARDIOGRAM COMPLETE  Result Date: 05/13/2021    ECHOCARDIOGRAM REPORT   Patient Name:   Alexa Orozco  Date of Exam: 05/13/2021 Medical Rec #:  [id number]  Height:       62.0 in Accession #:    [id number] Weight:       159.2 lb Date of Birth:  Sep 04, 1945   BSA:          1.735 m Patient Age:    60 years   BP:           176/92 mmHg Patient Gender: F          HR:           55 bpm. Exam Location:  [facility] Procedure: 2D Echo, Cardiac Doppler and Color Doppler Indications:    NSTEMI  History:        Patient has prior history of Echocardiogram examinations, most                 recent 05/22/2009. CAD, COPD; Risk Factors:Hypertension, Current                 Smoker  and Dyslipidemia. COVID +.  Sonographer:    Wenda Low Referring Phys: [place]  1. Left ventricular ejection fraction, by estimation, is 60 to 65%. The left ventricle has normal function. The left ventricle has no regional wall motion abnormalities. Left ventricular diastolic parameters are consistent with Grade I diastolic dysfunction (impaired relaxation).  2. Right ventricular systolic function is normal. The right ventricular size is normal. There is normal pulmonary artery systolic pressure.  3. The mitral valve is normal in structure. Trivial mitral valve regurgitation. No evidence of mitral stenosis.  4. The aortic valve is tricuspid. Aortic valve regurgitation is not visualized. No aortic stenosis is present.  5. The inferior vena cava is normal in size with greater than 50% respiratory variability, suggesting right atrial pressure of 3 mmHg. FINDINGS  Left Ventricle: Left ventricular ejection fraction, by estimation, is 60 to 65%. The left ventricle has normal function. The left ventricle has no regional wall motion abnormalities. The left ventricular internal cavity size was normal in size. There is  no left ventricular hypertrophy. Left ventricular diastolic parameters are consistent with Grade I diastolic dysfunction (impaired relaxation). Indeterminate filling pressures. Right Ventricle: The right ventricular size is normal. No increase in right ventricular wall thickness. Right ventricular systolic function is normal. There is normal pulmonary artery systolic pressure. The tricuspid regurgitant velocity is 1.62 m/s, and  with an assumed right atrial pressure of 3 mmHg, the estimated right ventricular systolic pressure is [id number] mmHg. Left Atrium: Left atrial size was normal in size. Right Atrium: Right atrial size was normal in size. Pericardium: There is no evidence of pericardial effusion. Mitral Valve: The mitral valve is normal in structure. Trivial mitral valve  regurgitation. No evidence of mitral valve stenosis. MV peak gradient, 3.9 mmHg. The mean mitral valve gradient is 1.0 mmHg. Tricuspid Valve: The tricuspid valve is normal in structure. Tricuspid valve regurgitation is trivial. No evidence of tricuspid stenosis. Aortic Valve: The aortic valve is tricuspid. Aortic valve regurgitation is not visualized. No aortic stenosis is present. Aortic valve mean gradient measures 3.0 mmHg. Aortic valve peak gradient measures 8.6 mmHg. Aortic valve area, by VTI measures 2.18 cm. Pulmonic Valve: The pulmonic valve was normal in structure. Pulmonic valve regurgitation is not visualized. No evidence of pulmonic stenosis. Aorta: The aortic root is normal in size and structure. Venous: The inferior vena cava is normal in size with greater than 50% respiratory variability, suggesting right atrial pressure of 3 mmHg. IAS/Shunts: No atrial level shunt detected by color flow Doppler.  LEFT VENTRICLE PLAX 2D LVIDd:         4.60 cm     Diastology LVIDs:         2.80 cm     LV e' medial:    7.62 cm/s LV PW:         1.10 cm     LV E/e' medial:  7.0 LV IVS:        1.00 cm     LV e' lateral:   9.25 cm/s LVOT diam:     1.90 cm     LV E/e' lateral: 5.7 LV SV:         69 LV SV Index:   40 LVOT Area:     2.84 cm  LV Volumes (MOD) LV vol d, MOD A2C: 70.3 ml LV vol d, MOD A4C: 67.3 ml LV vol s, MOD A2C: 20.3 ml LV vol s, MOD A4C: 27.4 ml LV SV MOD A2C:     50.0 ml  LV SV MOD A4C:     67.3 ml LV SV MOD BP:      46.6 ml RIGHT VENTRICLE RV Basal diam:  2.85 cm RV Mid diam:    2.30 cm RV S prime:     13.60 cm/s TAPSE (M-mode): 3.1 cm LEFT ATRIUM             Index       RIGHT ATRIUM           Index LA diam:        3.40 cm 1.96 cm/m  RA Area:     14.20 cm LA Vol (A2C):   48.9 ml 28.19 ml/m RA Volume:   37.50 ml  21.62 ml/m LA Vol (A4C):   42.6 ml 24.55 ml/m LA Biplane Vol: 45.7 ml 26.34 ml/m  AORTIC VALVE AV Area (Vmax):    2.04 cm AV Area (Vmean):   2.20 cm AV Area (VTI):     2.18 cm AV Vmax:            147.00 cm/s AV Vmean:          85.400 cm/s AV VTI:            0.318 m AV Peak Grad:      8.6 mmHg AV Mean Grad:      3.0 mmHg LVOT Vmax:         106.00 cm/s LVOT Vmean:        66.300 cm/s LVOT VTI:          0.245 m LVOT/AV VTI ratio: 0.77  AORTA Ao Root diam: 3.00 cm MITRAL VALVE               TRICUSPID VALVE MV Area (PHT): 3.87 cm    TR Peak grad:   10.5 mmHg MV Area VTI:   1.99 cm    TR Vmax:        161.80 cm/s MV Peak grad:  3.9 mmHg MV Mean grad:  1.0 mmHg    SHUNTS MV Vmax:       0.99 m/s    Systemic VTI:  0.24 m MV Vmean:      53.1 cm/s   Systemic Diam: 1.90 cm MV Decel Time: 196 msec MV E velocity: 52.98 cm/s MV A velocity: 81.40 cm/s MV E/A ratio:  0.65 Skeet Latch MD Electronically signed by Skeet Latch MD Signature Date/Time: 05/13/2021/2:08:39 PM    Final    (Echo, Carotid, EGD, Colonoscopy, ERCP)    Subjective: Patient seen and examined.  She is willing to go home as much as possible.  She tells me she has some cough but this is ongoing and chronic issue for her.  He remains afebrile.  Not on oxygen at home, however she needed 3 L on mobility.   Discharge Exam: Vitals:   05/16/21 0814 05/16/21 0817  BP:    Pulse:    Resp:    Temp:    SpO2: 95% 100%   Vitals:   05/16/21 0707 05/16/21 0800 05/16/21 0814 05/16/21 0817  BP: (!) 132/59     Pulse: 63     Resp: 18     Temp: 98 F (36.7 C)     TempSrc: Oral     SpO2: 96% 97% 95% 100%  Weight:      Height:        General: Pt is alert, awake, not in acute distress Frail and debilitated.  Chronically sick looking.  Not in any distress. Cardiovascular: RRR,  S1/S2 +, no rubs, no gallops Respiratory: Some conducted upper airway sounds.  Expiratory wheezing present.  Not in any distress. Abdominal: Soft, NT, ND, bowel sounds + Extremities: no edema, no cyanosis    The results of significant diagnostics from this hospitalization (including imaging, microbiology, ancillary and laboratory) are listed below for  reference.     Microbiology: Recent Results (from the past 240 hour(s))  Resp Panel by RT-PCR (Flu A&B, Covid) Nasopharyngeal Swab     Status: Abnormal   Collection Time: 05/12/21  1:46 PM   Specimen: Nasopharyngeal Swab; Nasopharyngeal(NP) swabs in vial transport medium  Result Value Ref Range Status   SARS Coronavirus 2 by RT PCR POSITIVE (A) NEGATIVE Final    Comment: CRITICAL RESULT CALLED TO, READ BACK BY AND VERIFIED WITH: CRAWFORD,H AT 1538 ON 9.12.22 BY RUCINSKI,B (NOTE) SARS-CoV-2 target nucleic acids are DETECTED.  The SARS-CoV-2 RNA is generally detectable in upper respiratory specimens during the acute phase of infection. Positive results are indicative of the presence of the identified virus, but do not rule out bacterial infection or co-infection with other pathogens not detected by the test. Clinical correlation with patient history and other diagnostic information is necessary to determine patient infection status. The expected result is Negative.  Fact Sheet for Patients: EntrepreneurPulse.com.au  Fact Sheet for Healthcare Providers: IncredibleEmployment.be  This test is not yet approved or cleared by the [place] FDA and  has been authorized for detection and/or diagnosis of SARS-CoV-2 by FDA under an Emergency Use Authorization (EUA).  This EUA will remain in effect (meani ng this test can be used) for the duration of  the COVID-19 declaration under Section 564(b)(1) of the Act, 21 U.S.C. section 360bbb-3(b)(1), unless the authorization is terminated or revoked sooner.     Influenza A by PCR NEGATIVE NEGATIVE Final   Influenza B by PCR NEGATIVE NEGATIVE Final    Comment: (NOTE) The Xpert Xpress SARS-CoV-2/FLU/RSV plus assay is intended as an aid in the diagnosis of influenza from Nasopharyngeal swab specimens and should not be used as a sole basis for treatment. Nasal washings and aspirates are unacceptable for  Xpert Xpress SARS-CoV-2/FLU/RSV testing.  Fact Sheet for Patients: EntrepreneurPulse.com.au  Fact Sheet for Healthcare Providers: IncredibleEmployment.be  This test is not yet approved or cleared by the [place] FDA and has been authorized for detection and/or diagnosis of SARS-CoV-2 by FDA under an Emergency Use Authorization (EUA). This EUA will remain in effect (meaning this test can be used) for the duration of the COVID-19 declaration under Section 564(b)(1) of the Act, 21 U.S.C. section 360bbb-3(b)(1), unless the authorization is terminated or revoked.  Performed at [facility], [address] [place], [place] [address]   MRSA Next Gen by PCR, Nasal     Status: Abnormal   Collection Time: 05/13/21  1:00 AM   Specimen: Nasal Mucosa; Nasal Swab  Result Value Ref Range Status   MRSA by PCR Next Gen DETECTED (A) NOT DETECTED Final    Comment: RESULT CALLED TO, READ BACK BY AND VERIFIED WITH: RN JESSICA WOLFE 05/13/21'@2'$ :17 BY TW (NOTE) The GeneXpert MRSA Assay (FDA approved for NASAL specimens only), is one component of a comprehensive MRSA colonization surveillance program. It is not intended to diagnose MRSA infection nor to guide or monitor treatment for MRSA infections. Test performance is not FDA approved in patients less than 86 years old. Performed at [facility], [place] [address], [place], [place] [address]   Expectorated Sputum Assessment w Gram Stain,  Rflx to Resp Cult     Status: None   Collection Time: 05/13/21 10:00 AM   Specimen: Expectorated Sputum  Result Value Ref Range Status   Specimen Description EXPECTORATED SPUTUM  Final   Special Requests Immunocompromised  Final   Sputum evaluation   Final    THIS SPECIMEN IS ACCEPTABLE FOR SPUTUM CULTURE Performed at [facility], [place] [address], [place], [place] [address]    Report Status 05/13/2021 FINAL  Final  Culture, Respiratory w Gram Stain     Status:  None   Collection Time: 05/13/21 10:00 AM  Result Value Ref Range Status   Specimen Description EXPECTORATED SPUTUM  Final   Special Requests Immunocompromised Reflexed from [id number]  Final   Gram Stain   Final    ABUNDANT WBC PRESENT, PREDOMINANTLY PMN RARE GRAM POSITIVE COCCI IN PAIRS RARE GRAM NEGATIVE RODS Performed at [facility], [place] [address] [place], [place] [address]    Culture FEW PSEUDOMONAS AERUGINOSA  Final   Report Status 05/15/2021 FINAL  Final   Organism ID, Bacteria PSEUDOMONAS AERUGINOSA  Final      Susceptibility   Pseudomonas aeruginosa - MIC*    CEFTAZIDIME 4 SENSITIVE Sensitive     CIPROFLOXACIN <=0.25 SENSITIVE Sensitive     GENTAMICIN <=1 SENSITIVE Sensitive     IMIPENEM 2 SENSITIVE Sensitive     PIP/TAZO 16 SENSITIVE Sensitive     CEFEPIME 2 SENSITIVE Sensitive     * FEW PSEUDOMONAS AERUGINOSA     Labs: BNP (last 3 results) Recent Labs    05/12/21 1355  BNP [id number]   Basic Metabolic Panel: Recent Labs  Lab 05/12/21 1353 05/14/21 0107 05/15/21 0128  NA 137 136  --   K 3.9 4.3  --   CL 105 103  --   CO2 23 23  --   GLUCOSE 137* 133*  --   BUN 17 14  --   CREATININE 0.79 0.74  --   CALCIUM 9.0 8.9  --   MG  --   --  2.0  PHOS  --   --  3.5   Liver Function Tests: Recent Labs  Lab 05/12/21 1353 05/14/21 0107 05/15/21 0128  AST 45* 52* 34  ALT 50* 57* 49*  ALKPHOS 123 94 92  BILITOT 0.5 0.7 0.6  PROT 8.1 6.4* 6.3*  ALBUMIN 4.2 3.1* 3.0*   No results for input(s): LIPASE, AMYLASE in the last 168 hours. No results for input(s): AMMONIA in the last 168 hours. CBC: Recent Labs  Lab 05/12/21 1353 05/14/21 0107 05/15/21 0128  WBC 8.6 7.5 9.4  NEUTROABS 7.3  --   --   HGB 14.9 13.5 13.2  HCT 44.7 40.2 39.4  MCV 93.1 91.6 91.0  PLT 378 379 392   Cardiac Enzymes: No results for input(s): CKTOTAL, CKMB, CKMBINDEX, TROPONINI in the last 168 hours. BNP: Invalid input(s): POCBNP CBG: No results for input(s): GLUCAP in the  last 168 hours. D-Dimer Recent Labs    05/15/21 0128  DDIMER 0.47   Hgb A1c No results for input(s): HGBA1C in the last 72 hours. Lipid Profile No results for input(s): CHOL, HDL, LDLCALC, TRIG, CHOLHDL, LDLDIRECT in the last 72 hours. Thyroid function studies No results for input(s): TSH, T4TOTAL, T3FREE, THYROIDAB in the last 72 hours.  Invalid input(s): FREET3 Anemia work up No results for input(s): VITAMINB12, FOLATE, FERRITIN, TIBC, IRON, RETICCTPCT in the last 72 hours. Urinalysis    Component Value Date/Time  COLORURINE DARK YELLOW 12/23/2020 1136   APPEARANCEUR Clear 03/28/2021 1441   LABSPEC 1.025 12/23/2020 1136   PHURINE 6.5 12/23/2020 1136   GLUCOSEU Negative 03/28/2021 1441   HGBUR 3+ (A) 12/23/2020 1136   BILIRUBINUR Negative 03/28/2021 1441   KETONESUR NEGATIVE 12/23/2020 1136   PROTEINUR Negative 03/28/2021 1441   PROTEINUR 2+ (A) 12/23/2020 1136   NITRITE Negative 03/28/2021 1441   NITRITE POSITIVE (A) 12/23/2020 1136   LEUKOCYTESUR Negative 03/28/2021 1441   LEUKOCYTESUR 3+ (A) 12/23/2020 1136   Sepsis Labs Invalid input(s): PROCALCITONIN,  WBC,  LACTICIDVEN Microbiology Recent Results (from the past 240 hour(s))  Resp Panel by RT-PCR (Flu A&B, Covid) Nasopharyngeal Swab     Status: Abnormal   Collection Time: 05/12/21  1:46 PM   Specimen: Nasopharyngeal Swab; Nasopharyngeal(NP) swabs in vial transport medium  Result Value Ref Range Status   SARS Coronavirus 2 by RT PCR POSITIVE (A) NEGATIVE Final    Comment: CRITICAL RESULT CALLED TO, READ BACK BY AND VERIFIED WITH: CRAWFORD,H AT 1538 ON 9.12.22 BY RUCINSKI,B (NOTE) SARS-CoV-2 target nucleic acids are DETECTED.  The SARS-CoV-2 RNA is generally detectable in upper respiratory specimens during the acute phase of infection. Positive results are indicative of the presence of the identified virus, but do not rule out bacterial infection or co-infection with other pathogens not detected by the test.  Clinical correlation with patient history and other diagnostic information is necessary to determine patient infection status. The expected result is Negative.  Fact Sheet for Patients: EntrepreneurPulse.com.au  Fact Sheet for Healthcare Providers: IncredibleEmployment.be  This test is not yet approved or cleared by the [place] FDA and  has been authorized for detection and/or diagnosis of SARS-CoV-2 by FDA under an Emergency Use Authorization (EUA).  This EUA will remain in effect (meani ng this test can be used) for the duration of  the COVID-19 declaration under Section 564(b)(1) of the Act, 21 U.S.C. section 360bbb-3(b)(1), unless the authorization is terminated or revoked sooner.     Influenza A by PCR NEGATIVE NEGATIVE Final   Influenza B by PCR NEGATIVE NEGATIVE Final    Comment: (NOTE) The Xpert Xpress SARS-CoV-2/FLU/RSV plus assay is intended as an aid in the diagnosis of influenza from Nasopharyngeal swab specimens and should not be used as a sole basis for treatment. Nasal washings and aspirates are unacceptable for Xpert Xpress SARS-CoV-2/FLU/RSV testing.  Fact Sheet for Patients: EntrepreneurPulse.com.au  Fact Sheet for Healthcare Providers: IncredibleEmployment.be  This test is not yet approved or cleared by the [place] FDA and has been authorized for detection and/or diagnosis of SARS-CoV-2 by FDA under an Emergency Use Authorization (EUA). This EUA will remain in effect (meaning this test can be used) for the duration of the COVID-19 declaration under Section 564(b)(1) of the Act, 21 U.S.C. section 360bbb-3(b)(1), unless the authorization is terminated or revoked.  Performed at [facility], [address], [place], [place] [address]   MRSA Next Gen by PCR, Nasal     Status: Abnormal   Collection Time: 05/13/21  1:00 AM   Specimen: Nasal Mucosa; Nasal Swab  Result Value Ref  Range Status   MRSA by PCR Next Gen DETECTED (A) NOT DETECTED Final    Comment: RESULT CALLED TO, READ BACK BY AND VERIFIED WITH: RN JESSICA WOLFE 05/13/21'@2'$ :17 BY TW (NOTE) The GeneXpert MRSA Assay (FDA approved for NASAL specimens only), is one component of a comprehensive MRSA colonization surveillance program. It is not intended to diagnose MRSA infection nor to guide or  monitor treatment for MRSA infections. Test performance is not FDA approved in patients less than 77 years old. Performed at [facility], [place] [address], [place], [place] [address]   Expectorated Sputum Assessment w Gram Stain, Rflx to Resp Cult     Status: None   Collection Time: 05/13/21 10:00 AM   Specimen: Expectorated Sputum  Result Value Ref Range Status   Specimen Description EXPECTORATED SPUTUM  Final   Special Requests Immunocompromised  Final   Sputum evaluation   Final    THIS SPECIMEN IS ACCEPTABLE FOR SPUTUM CULTURE Performed at [facility], [place] [address], [place], [place] [address]    Report Status 05/13/2021 FINAL  Final  Culture, Respiratory w Gram Stain     Status: None   Collection Time: 05/13/21 10:00 AM  Result Value Ref Range Status   Specimen Description EXPECTORATED SPUTUM  Final   Special Requests Immunocompromised Reflexed from [id number]  Final   Gram Stain   Final    ABUNDANT WBC PRESENT, PREDOMINANTLY PMN RARE GRAM POSITIVE COCCI IN PAIRS RARE GRAM NEGATIVE RODS Performed at [facility], [place] [address] [place], [place] [address]    Culture FEW PSEUDOMONAS AERUGINOSA  Final   Report Status 05/15/2021 FINAL  Final   Organism ID, Bacteria PSEUDOMONAS AERUGINOSA  Final      Susceptibility   Pseudomonas aeruginosa - MIC*    CEFTAZIDIME 4 SENSITIVE Sensitive     CIPROFLOXACIN <=0.25 SENSITIVE Sensitive     GENTAMICIN <=1 SENSITIVE Sensitive     IMIPENEM 2 SENSITIVE Sensitive     PIP/TAZO 16 SENSITIVE Sensitive     CEFEPIME 2 SENSITIVE Sensitive     * FEW  PSEUDOMONAS AERUGINOSA     Time coordinating discharge: 35 minutes  SIGNED:   Barb Merino, MD  [facility] 05/16/2021, 10:44 AM

## 2021-05-16 NOTE — Progress Notes (Addendum)
Physical Therapy Treatment Patient Details Name: Alexa Orozco MRN: [id number] DOB: [birth year] Today's Date: 05/16/2021   History of Present Illness Pt is a 75 y.o. female admitted 05/12/10 with c/o progressive SOB, cough, congestion, chest pressure; pt tested (+) COVID-19. Workup for elevated troponin, suspect either NSTEMI or demand ischemia secondary to COVID. PMH includes CAD, asthma, emphysema, HTN, COPD, bladder CA, osteoporosis, Raynaud's.   PT Comments    Pt progressing with mobility; able to ambulate throughout room with supervision for safety/lines. Noted improvement in SOB with mobility compared to initial evaluation; still with DOE 2/4 with ambulation. Reviewed educ re: activity recommendations, pursed lip breathing, energy conservation. Pt motivated for d/c home today, reports no further questions or concerns. Will d/c acute PT.     Recommendations for follow up therapy are one component of a multi-disciplinary discharge planning process, led by the attending physician.  Recommendations may be updated based on patient status, additional functional criteria and insurance authorization.  Follow Up Recommendations  No PT follow up     Equipment Recommendations  None recommended by PT    Recommendations for Other Services       Precautions / Restrictions Precautions Precautions: Fall Precaution Comments: SpO2 stable on RA Restrictions Weight Bearing Restrictions: No     Mobility  Bed Mobility Overal bed mobility: Independent             General bed mobility comments: Received sitting in recliner    Transfers Overall transfer level: Independent Equipment used: None Transfers: Sit to/from Stand Sit to Stand: Supervision Stand pivot transfers: Supervision          Ambulation/Gait Ambulation/Gait assistance: Supervision Gait Distance (Feet): 60 Feet Assistive device: None Gait Pattern/deviations: Step-through pattern;Decreased stride length Gait velocity:  Decreased   General Gait Details: Slow, steady gait without DME, ambulating multiple laps around room with supervision for safety/lines; DOE 2/4, significantly improved since prior session, remains limited by fatigue; SpO2 down to 95% on RA   Stairs             Wheelchair Mobility    Modified Rankin (Stroke Patients Only)       Balance Overall balance assessment: Needs assistance   Sitting balance-Leahy Scale: Good Sitting balance - Comments: sat on eob for dressing   Standing balance support: No upper extremity supported;During functional activity Standing balance-Leahy Scale: Good                              Cognition Arousal/Alertness: Awake/alert Behavior During Therapy: WFL for tasks assessed/performed Overall Cognitive Status: Within Functional Limits for tasks assessed                                 General Comments: able to recall 3/3 energy conservation techniques      Exercises      General Comments General comments (skin integrity, edema, etc.): Reviewed educ re: activity recommendations, pursed lip breathing, energy conservation, SpO2 stable on RA      Pertinent Vitals/Pain Pain Assessment: No/denies pain Pain Intervention(s): Monitored during session    Home Living                      Prior Function            PT Goals (current goals can now be found in the care plan section) Acute Rehab PT Goals Patient Stated  Goal: be able to go back home Progress towards PT goals: Progressing toward goals    Frequency    Min 3X/week      PT Plan Current plan remains appropriate    Co-evaluation              AM-PAC PT "6 Clicks" Mobility   Outcome Measure  Help needed turning from your back to your side while in a flat bed without using bedrails?: None Help needed moving from lying on your back to sitting on the side of a flat bed without using bedrails?: None Help needed moving to and from a bed  to a chair (including a wheelchair)?: None Help needed standing up from a chair using your arms (e.g., wheelchair or bedside chair)?: None Help needed to walk in hospital room?: A Little Help needed climbing 3-5 steps with a railing? : A Little 6 Click Score: 22    End of Session   Activity Tolerance: Patient tolerated treatment well Patient left: in chair;with call bell/phone within reach;with nursing/sitter in room Nurse Communication: Mobility status PT Visit Diagnosis: Other abnormalities of gait and mobility (R26.89)     Time: [id number] PT Time Calculation (min) (ACUTE ONLY): 20 min  Charges:  $Self Care/Home Management: 8-22                     Mabeline Caras, PT, DPT Acute Rehabilitation Services  Pager [phone] Office [place] 05/16/2021, 3:18 PM

## 2021-05-16 NOTE — Progress Notes (Signed)
SATURATION QUALIFICATIONS: (This note is used to comply with regulatory documentation for home oxygen)  Patient Saturations on Room Air at Rest = 97%  Patient Saturations on Room Air while Ambulating = 95%  Patient Saturations on -- Liters of oxygen while Ambulating = N/A  Please briefly explain why patient needs home oxygen: Patient does not require supplemental oxygen to maintain adequate saturations with mobility. Of note, pulse ox probe switched to ear with much more reliable pleth reading this session.  Mabeline Caras, PT, DPT Acute Rehabilitation Services  Pager [phone] Office [phone]

## 2021-05-19 ENCOUNTER — Telehealth: Payer: Self-pay

## 2021-05-19 ENCOUNTER — Other Ambulatory Visit: Payer: Self-pay | Admitting: Family Medicine

## 2021-05-19 MED ORDER — PREDNISONE 20 MG PO TABS: ORAL_TABLET | ORAL | 0 refills | Status: DC

## 2021-05-19 MED ORDER — LEVOTHYROXINE SODIUM 88 MCG PO TABS: 88.0000 ug | ORAL_TABLET | Freq: Every day | ORAL | 3 refills | Status: DC

## 2021-05-19 NOTE — Telephone Encounter (Signed)
Transition Care Management Follow-up Telephone Call Date of discharge and from where: 05/16/21 from [facility] Diagnosis: MI related to Covid How have you been since you were released from the hospital? Getting stronger each day per pt Any questions or concerns? No  Items Reviewed: Did the pt receive and understand the discharge instructions provided? No  Medications obtained and verified? Yes  Other? No  Any new allergies since your discharge? No  Dietary orders reviewed? Yes Do you have support at home? Yes   Home Care and Equipment/Supplies: Were home health services ordered? not applicable If so, what is the name of the agency? N/A  Has the agency set up a time to come to the patient's home? not applicable Were any new equipment or medical supplies ordered?  No What is the name of the medical supply agency? N/A Were you able to get the supplies/equipment? not applicable Do you have any questions related to the use of the equipment or supplies? No  Functional Questionnaire: (I = Independent and D = Dependent) ADLs: I  Bathing/Dressing- I  Meal Prep- I  Eating- I  Maintaining continence- I  Transferring/Ambulation- I  Managing Meds- I  Follow up appointments reviewed:  PCP Hospital f/u appt confirmed? Yes  Scheduled to see Dr. Dennard Schaumann on 05/22/21 @ 12:15 pm. [facility] f/u appt confirmed?  N/A   Are transportation arrangements needed? No  If their condition worsens, is the pt aware to call PCP or go to the Emergency Dept.? Yes Was the patient provided with contact information for the PCP's office or ED? Yes Was to pt encouraged to call back with questions or concerns? Yes

## 2021-05-19 NOTE — Telephone Encounter (Signed)
Called pt to do [facility] after hospital discharge. Pt states she is in need of a refill on her Levothyroxine 88 mcg.  Pt also states that an Rx for Prednisone was supposed to be sent in for her. In reviewing her meds, it looks like an Rx was ordered but I don't see a date for when. Can you double check this for me? Thank you!

## 2021-05-22 ENCOUNTER — Encounter: Payer: Self-pay | Admitting: Family Medicine

## 2021-05-22 ENCOUNTER — Other Ambulatory Visit: Payer: Self-pay

## 2021-05-22 ENCOUNTER — Ambulatory Visit (INDEPENDENT_AMBULATORY_CARE_PROVIDER_SITE_OTHER): Payer: Medicare Other | Admitting: Family Medicine

## 2021-05-22 VITALS — BP 120/64 | HR 70 | Temp 98.2°F | Resp 16 | Ht 62.0 in | Wt 167.0 lb

## 2021-05-22 DIAGNOSIS — J1282 Pneumonia due to coronavirus disease 2019: Secondary | ICD-10-CM | POA: Diagnosis not present

## 2021-05-22 DIAGNOSIS — U071 COVID-19: Secondary | ICD-10-CM

## 2021-05-22 LAB — COMPLETE METABOLIC PANEL WITH GFR
AG Ratio: 1.3 (calc) (ref 1.0–2.5)
ALT: 34 U/L — ABNORMAL HIGH (ref 6–29)
AST: 21 U/L (ref 10–35)
Albumin: 3.8 g/dL (ref 3.6–5.1)
Alkaline phosphatase (APISO): 94 U/L (ref 37–153)
BUN: 25 mg/dL (ref 7–25)
CO2: 26 mmol/L (ref 20–32)
Calcium: 9.7 mg/dL (ref 8.6–10.4)
Chloride: 105 mmol/L (ref 98–110)
Creat: 0.96 mg/dL (ref 0.60–1.00)
Globulin: 2.9 g/dL (calc) (ref 1.9–3.7)
Glucose, Bld: 87 mg/dL (ref 65–99)
Potassium: 5.1 mmol/L (ref 3.5–5.3)
Sodium: 141 mmol/L (ref 135–146)
Total Bilirubin: 0.4 mg/dL (ref 0.2–1.2)
Total Protein: 6.7 g/dL (ref 6.1–8.1)
eGFR: 62 mL/min/{1.73_m2} (ref >=60)

## 2021-05-22 LAB — CBC WITH DIFFERENTIAL/PLATELET
Absolute Monocytes: 423 cells/uL (ref 200–950)
Basophils Absolute: 28 cells/uL (ref 0–200)
Basophils Relative: 0.2 %
Eosinophils Absolute: 14 cells/uL — ABNORMAL LOW (ref 15–500)
Eosinophils Relative: 0.1 %
HCT: 35.2 % (ref 35.0–45.0)
Hemoglobin: 11.7 g/dL (ref 11.7–15.5)
Lymphs Abs: 1805 cells/uL (ref 850–3900)
MCH: 30.5 pg (ref 27.0–33.0)
MCHC: 33.2 g/dL (ref 32.0–36.0)
MCV: 91.9 fL (ref 80.0–100.0)
MPV: 10.4 fL (ref 7.5–12.5)
Monocytes Relative: 3 %
Neutro Abs: 11830 cells/uL — ABNORMAL HIGH (ref 1500–7800)
Neutrophils Relative %: 83.9 %
Platelets: 344 10*3/uL (ref 140–400)
RBC: 3.83 10*6/uL (ref 3.80–5.10)
RDW: 13.5 % (ref 11.0–15.0)
Total Lymphocyte: 12.8 %
WBC: 14.1 10*3/uL — ABNORMAL HIGH (ref 3.8–10.8)

## 2021-05-22 NOTE — Progress Notes (Signed)
Subjective:    Patient ID: Alexa Orozco, female    DOB: 04-29-1946, 75 y.o.   MRN: [id number]  HPI Patient is a very sweet 75 year old Caucasian female here today for follow-up.  Please see my last office visit.  Patient was coming in with a respiratory infection and a COPD exacerbation.  Differential diagnosis include bacterial versus COVID.  Patient had done a COVID test at home that was negative and we treated her presumptively for a COPD exacerbation with a Z-Pak and prednisone.  Unfortunately she continued to worsen and ultimately went to the hospital suffering from a non-ST elevation myocardial infarction due to strain from her underlying respiratory condition.  COVID test at the hospital was positive.  Therefore she was treated for bacterial pneumonia originally with Zosyn and ultimately finishing a Z-Pak, prednisone, and inhalers.  She is here today for follow-up.  She states that she is using Spiriva on a daily basis and also using albuterol as needed.  She states that her breathing is much better than it was although she still seems labored even when talking to me.  She is trying to rest at home.  She denies any chest pain. Past Medical History:  Diagnosis Date   Allergies    Allergy    Anxiety    Asthma    Bladder cancer ([place])    CAD S/P percutaneous coronary angioplasty 10/2005   Class III Angina --> Cath: 85% -- PCI 3.47mm x 31mm (4.0 mm) Cypher DES to RCA; Myoview October 2014: normal stress test: LOW RISK; mild anteroapical breast attenuation   Cataract    h/o bilat repair   Claudication ([place]) 06/01/2013   COPD (chronic obstructive pulmonary disease) ([place])    Depression 11/29/2013   Emphysema of lung ([facility])    GERD (gastroesophageal reflux disease)    History of palpitations    History of tobacco abuse    Hyperlipidemia    Hypertension    well-controlled   Hypothyroidism    Osteoporosis    Raynaud disease    Past Surgical History:  Procedure Laterality Date   ABDOMINAL  HYSTERECTOMY     h/o partila hysterectomy   BREAST BIOPSY  1988   CORONARY ANGIOPLASTY WITH STENT PLACEMENT  10/14/2005   cypher DES (3.73mmx18mm) to high grade RCA lesion   NM MYOVIEW LTD  July 2012 of October 2014   '12: dipyridamole; Normal, low risk study; 2014 - normal stress test: LOW RISK; mild anteroapical breast attenuation   TONSILLECTOMY  1959   TRANSTHORACIC ECHOCARDIOGRAM  05/22/2009   EF=>55%, normal LV systolic function; normal RV systolic function; mild MR/TR   TRANSURETHRAL RESECTION OF BLADDER TUMOR N/A 11/08/2020   Procedure: TRANSURETHRAL RESECTION OF BLADDER TUMOR (TURBT);  Surgeon: Billey Co, MD;  Location: [facility] ORS;  Service: Urology;  Laterality: N/A;   TRANSURETHRAL RESECTION OF BLADDER TUMOR N/A 12/13/2020   Procedure: TRANSURETHRAL RESECTION OF BLADDER TUMOR (TURBT);  Surgeon: Billey Co, MD;  Location: [facility] ORS;  Service: Urology;  Laterality: N/A;   Current Outpatient Medications on File Prior to Visit  Medication Sig Dispense Refill   acetaminophen (TYLENOL) 500 MG tablet Take 500-1,000 mg by mouth every 6 (six) hours as needed (pain).     albuterol (PROVENTIL) (2.5 MG/3ML) 0.083% nebulizer solution Take 3 mLs (2.5 mg total) by nebulization every 6 (six) hours as needed for wheezing or shortness of breath. 150 mL 1   albuterol (VENTOLIN HFA) 108 (90 Base) MCG/ACT inhaler Inhale 2 puffs into the lungs  every 6 (six) hours as needed for wheezing or shortness of breath. Patients insurance requires CFC-free aerosol.  Please dispense only that 8 g 2   aspirin EC 81 MG EC tablet Take 1 tablet (81 mg total) by mouth daily. Swallow whole. 30 tablet 11   clopidogrel (PLAVIX) 75 MG tablet Take 1 tablet (75 mg total) by mouth daily. 30 tablet 3   cromolyn (OPTICROM) 4 % ophthalmic solution Place 1 drop into both eyes 4 (four) times daily.     Eszopiclone 3 MG TABS TAKE 1 TABLET BY MOUTH IMMEDIATELY BEFORE BEDTIME (Patient taking differently: Take 3 mg by mouth at  bedtime.) 30 tablet 0   hydrALAZINE (APRESOLINE) 50 MG tablet Take 1 tablet (50 mg total) by mouth every 6 (six) hours. 120 tablet 0   levothyroxine (SYNTHROID) 88 MCG tablet Take 1 tablet (88 mcg total) by mouth daily before breakfast. 30 tablet 3   meclizine (ANTIVERT) 25 MG tablet Take 0.5 tablets (12.5 mg total) by mouth 3 (three) times daily as needed for dizziness. 30 tablet 0   montelukast (SINGULAIR) 10 MG tablet TAKE 1 TABLET BY MOUTH EVERYDAY AT BEDTIME 90 tablet 1   nitroGLYCERIN (NITROSTAT) 0.4 MG SL tablet Place 1 tablet (0.4 mg total) under the tongue every 5 (five) minutes as needed for chest pain. 25 tablet 6   pantoprazole (PROTONIX) 40 MG tablet Take 1 tablet (40 mg total) by mouth every morning. 30 tablet 3   pravastatin (PRAVACHOL) 20 MG tablet Take 1 tablet (20 mg total) by mouth every morning. 30 tablet 3   predniSONE (DELTASONE) 20 MG tablet 3 tabs daily for 2 days 2 tabs daily for 2 days  1 tab daily for 2 days 12 tablet 0   sertraline (ZOLOFT) 100 MG tablet Take 1.5 tablets (150 mg total) by mouth daily. 60 tablet 3   Tiotropium Bromide Monohydrate (SPIRIVA RESPIMAT) 2.5 MCG/ACT AERS Inhale 2 puffs into the lungs every morning. 4 g 3   No current facility-administered medications on file prior to visit.   Allergies  Allergen Reactions   Bystolic [Nebivolol Hcl] Hives   Cephalexin Hives   Effexor [Venlafaxine] Other (See Comments)    Made her face hot and red   Levaquin [Levofloxacin In D5w] Other (See Comments)    Caused face to turn red and patient became extremely hot    Social History   Socioeconomic History   Marital status: Widowed    Spouse name: Not on file   Number of children: 3   Years of education: 10th    Highest education level: Not on file  Occupational History   Occupation: Retired  Tobacco Use   Smoking status: Every Day    Packs/day: 0.50    Years: 35.00    Pack years: 17.50    Types: Cigarettes   Smokeless tobacco: Never   Tobacco  comments:    11/11/18 still at 1/2ppd  Vaping Use   Vaping Use: Never used  Substance and Sexual Activity   Alcohol use: No   Drug use: No   Sexual activity: Not on file  Other Topics Concern   Not on file  Social History Narrative   She is a widowed mother of 53, grandmother for, great-grandmother 87.  She still smokes about half pack a day.  She doesn't have about 35 years.  She does not drink.   Prior to her symptoms coming on, she used to do all kind of walking around and working in the  garden and other activities with her close friend.   Social Determinants of Health   Financial Resource Strain: Low Risk    Difficulty of Paying Living Expenses: Not hard at all  Food Insecurity: No Food Insecurity   Worried About Charity fundraiser in the Last Year: Never true   [place] in the Last Year: Never true  Transportation Needs: No Transportation Needs   Lack of Transportation (Medical): No   Lack of Transportation (Non-Medical): No  Physical Activity: Inactive   Days of Exercise per Week: 0 days   Minutes of Exercise per Session: 0 min  Stress: No Stress Concern Present   Feeling of Stress : Not at all  Social Connections: Moderately Isolated   Frequency of Communication with Friends and Family: More than three times a week   Frequency of Social Gatherings with Friends and Family: More than three times a week   Attends Religious Services: More than 4 times per year   Active Member of [employer] or Organizations: No   Attends Archivist Meetings: Never   Marital Status: Widowed  Human resources officer Violence: Not At Risk   Fear of Current or Ex-Partner: No   Emotionally Abused: No   Physically Abused: No   Sexually Abused: No      Review of Systems  All other systems reviewed and are negative.     Objective:   Physical Exam Vitals reviewed.  Constitutional:      General: She is not in acute distress.    Appearance: Normal appearance. She is normal weight. She  is not ill-appearing or toxic-appearing.  HENT:     Head: Normocephalic and atraumatic.     Right Ear: Tympanic membrane and ear canal normal.     Left Ear: Tympanic membrane and ear canal normal.  Cardiovascular:     Rate and Rhythm: Normal rate and regular rhythm.     Heart sounds: Normal heart sounds.  Pulmonary:     Effort: Pulmonary effort is normal. No respiratory distress.     Breath sounds: No stridor. Wheezing present. No rhonchi or rales.  Lymphadenopathy:     Cervical: No cervical adenopathy.  Neurological:     Mental Status: She is alert.          Assessment & Plan:  Pneumonia due to COVID-19 virus - Plan: CBC with Differential/Platelet, COMPLETE METABOLIC PANEL WITH GFR Thankfully patient is improving.  I would like to maximize therapy for COPD in an effort to try to reduce any cardiac strain.  Therefore I will place her on Trelegy 1 inhalation a day.  Of asked her to discontinue Spiriva.  She is not taking enalapril.  She had to discontinue hydralazine due to an allergic reaction.  However her blood pressure here today is excellent.  Therefore I do not feel that she needs to replace this with any other blood pressure medication at the present time.  She will check her blood pressure frequently at home and if greater than 140/90, I would start the patient back on an angiotensin receptor blocker such as losartan.  I will repeat a CBC and a CMP to show resolution of her transient transaminitis.  Strongly encourage the patient to quit smoking.  Also recommended that she get a COVID-vaccine in approximately 30 days

## 2021-05-23 ENCOUNTER — Other Ambulatory Visit: Payer: Self-pay | Admitting: *Deleted

## 2021-05-23 MED ORDER — PANTOPRAZOLE SODIUM 40 MG PO TBEC: 40.0000 mg | DELAYED_RELEASE_TABLET | ORAL | 3 refills | Status: DC

## 2021-05-26 ENCOUNTER — Other Ambulatory Visit: Payer: Self-pay | Admitting: *Deleted

## 2021-05-26 DIAGNOSIS — D72829 Elevated white blood cell count, unspecified: Secondary | ICD-10-CM

## 2021-05-27 ENCOUNTER — Ambulatory Visit
Admission: RE | Admit: 2021-05-27 | Discharge: 2021-05-27 | Disposition: A | Discharge status: Home / Self Care | Payer: Medicare Other | Source: Ambulatory Visit | Attending: Family Medicine | Admitting: Family Medicine

## 2021-05-27 ENCOUNTER — Other Ambulatory Visit: Payer: Medicare Other

## 2021-05-27 ENCOUNTER — Other Ambulatory Visit: Payer: Self-pay

## 2021-05-27 DIAGNOSIS — D72829 Elevated white blood cell count, unspecified: Secondary | ICD-10-CM

## 2021-05-27 DIAGNOSIS — R059 Cough, unspecified: Secondary | ICD-10-CM | POA: Diagnosis not present

## 2021-05-27 LAB — CBC WITH DIFFERENTIAL/PLATELET
Absolute Monocytes: 851 cells/uL (ref 200–950)
Basophils Absolute: 27 cells/uL (ref 0–200)
Basophils Relative: 0.2 %
Eosinophils Absolute: 135 cells/uL (ref 15–500)
Eosinophils Relative: 1 %
HCT: 35.4 % (ref 35.0–45.0)
Hemoglobin: 11.8 g/dL (ref 11.7–15.5)
Lymphs Abs: 3659 cells/uL (ref 850–3900)
MCH: 31.5 pg (ref 27.0–33.0)
MCHC: 33.3 g/dL (ref 32.0–36.0)
MCV: 94.4 fL (ref 80.0–100.0)
MPV: 10.4 fL (ref 7.5–12.5)
Monocytes Relative: 6.3 %
Neutro Abs: 8829 cells/uL — ABNORMAL HIGH (ref 1500–7800)
Neutrophils Relative %: 65.4 %
Platelets: 300 10*3/uL (ref 140–400)
RBC: 3.75 10*6/uL — ABNORMAL LOW (ref 3.80–5.10)
RDW: 13.8 % (ref 11.0–15.0)
Total Lymphocyte: 27.1 %
WBC: 13.5 10*3/uL — ABNORMAL HIGH (ref 3.8–10.8)

## 2021-05-28 ENCOUNTER — Other Ambulatory Visit: Payer: Self-pay | Admitting: Family Medicine

## 2021-05-28 NOTE — Telephone Encounter (Signed)
Last OV 05/22/21 Last refill 04/24/21 Next appt.--none  Please advise

## 2021-06-04 ENCOUNTER — Other Ambulatory Visit: Payer: Self-pay | Admitting: *Deleted

## 2021-06-04 ENCOUNTER — Telehealth: Payer: Self-pay | Admitting: *Deleted

## 2021-06-04 DIAGNOSIS — D72829 Elevated white blood cell count, unspecified: Secondary | ICD-10-CM

## 2021-06-04 DIAGNOSIS — R300 Dysuria: Secondary | ICD-10-CM

## 2021-06-04 NOTE — Telephone Encounter (Signed)
Received call from patient.   Reports that she forgot to mention she is having some burning with urination in addition to the elevated WBC.   Advised patient to stop by office for UA. Future orders placed.   Patient also reports that she is doing well on Trelegy and would like prescription sent to pharmacy. Ok to refill?

## 2021-06-05 ENCOUNTER — Other Ambulatory Visit: Payer: Self-pay

## 2021-06-05 ENCOUNTER — Other Ambulatory Visit: Payer: Medicare Other

## 2021-06-05 DIAGNOSIS — D72829 Elevated white blood cell count, unspecified: Secondary | ICD-10-CM | POA: Diagnosis not present

## 2021-06-05 DIAGNOSIS — R300 Dysuria: Secondary | ICD-10-CM | POA: Diagnosis not present

## 2021-06-05 MED ORDER — TRELEGY ELLIPTA 100-62.5-25 MCG/INH IN AEPB: 1.0000 | INHALATION_SPRAY | Freq: Every day | RESPIRATORY_TRACT | 11 refills | Status: DC

## 2021-06-06 ENCOUNTER — Other Ambulatory Visit: Payer: Self-pay | Admitting: Family Medicine

## 2021-06-06 MED ORDER — SULFAMETHOXAZOLE-TRIMETHOPRIM 800-160 MG PO TABS: 1.0000 | ORAL_TABLET | Freq: Two times a day (BID) | ORAL | 0 refills | Status: DC

## 2021-06-07 LAB — URINE CULTURE
MICRO NUMBER:: 12469965
SPECIMEN QUALITY:: ADEQUATE

## 2021-06-07 LAB — URINALYSIS, ROUTINE W REFLEX MICROSCOPIC
Bilirubin Urine: NEGATIVE
Glucose, UA: NEGATIVE
Hgb urine dipstick: NEGATIVE
Hyaline Cast: NONE SEEN /LPF
Ketones, ur: NEGATIVE
Nitrite: NEGATIVE
Protein, ur: NEGATIVE
Specific Gravity, Urine: 1.028 (ref 1.001–1.035)
Yeast: NONE SEEN /HPF
pH: 5.5 (ref 5.0–8.0)

## 2021-06-07 LAB — MICROSCOPIC MESSAGE

## 2021-06-09 ENCOUNTER — Other Ambulatory Visit: Payer: Self-pay | Admitting: Family Medicine

## 2021-06-09 MED ORDER — CIPROFLOXACIN HCL 500 MG PO TABS: 500.0000 mg | ORAL_TABLET | Freq: Two times a day (BID) | ORAL | 0 refills | Status: AC

## 2021-06-11 ENCOUNTER — Encounter: Payer: Self-pay | Admitting: Urology

## 2021-06-11 ENCOUNTER — Other Ambulatory Visit: Payer: Self-pay

## 2021-06-11 ENCOUNTER — Ambulatory Visit (INDEPENDENT_AMBULATORY_CARE_PROVIDER_SITE_OTHER): Payer: Medicare Other | Admitting: Urology

## 2021-06-11 VITALS — BP 122/72 | HR 60 | Ht 62.0 in

## 2021-06-11 DIAGNOSIS — C679 Malignant neoplasm of bladder, unspecified: Secondary | ICD-10-CM

## 2021-06-11 DIAGNOSIS — N3281 Overactive bladder: Secondary | ICD-10-CM

## 2021-06-11 MED ORDER — LIDOCAINE HCL URETHRAL/MUCOSAL 2 % EX GEL
1.0000 "application " | Freq: Once | CUTANEOUS | Status: AC
  Administered 2021-06-11: 1 via URETHRAL

## 2021-06-11 MED ORDER — TROSPIUM CHLORIDE ER 60 MG PO CP24: 60.0000 mg | ORAL_CAPSULE | Freq: Every day | ORAL | 11 refills | Status: DC

## 2021-06-11 NOTE — Progress Notes (Signed)
Bladder cancer surveillance note   UROLOGIC HISTORY Alexa Orozco is a 75 y.o. female who originally presented with gross hematuria and on CT was found to have a large 7+ centimeter bladder tumor at the right lateral wall, posterior wall, and anteriorly.  Initial Diagnosis of Bladder  Year: 10/2020 Pathology: High-grade T1 urothelial cell carcinoma  11/2020: Second look TURBT-pathology showing high-grade T1, muscle present and not involved   Treatments for Bladder Cancer Initial TURBT 10/2020 TURBT second look 11/2020 Induction BCG x6 completed July 2022  AUA Risk Category High  Cystoscopy Procedure Note:   After informed consent and discussion of the procedure and its risks, Madden Piazza was positioned and prepped in the standard fashion. Cystoscopy was performed with the a flexible cystoscope. The urethra, bladder neck and entire bladder was visualized in a standard fashion.  Urine cloudy which limited vision.  No definite tumors seen, no abnormalities on retroflexion.  The ureteral orifices were visualized in their normal location and orientation.  Cytology sent.  Call with cytology results, if negative continue with maintenance BCG Finish Cipro for UTI 1 week ago Trial of trospium 60 mg XL for OAB symptoms   [name], MD 06/11/2021

## 2021-06-11 NOTE — Addendum Note (Signed)
Addended by: Donalee Citrin on: 06/11/2021 03:03 PM   Modules accepted: Orders

## 2021-06-13 ENCOUNTER — Telehealth: Payer: Self-pay

## 2021-06-13 ENCOUNTER — Telehealth: Payer: Self-pay | Admitting: *Deleted

## 2021-06-13 LAB — CYTOLOGY - NON PAP

## 2021-06-13 NOTE — Telephone Encounter (Signed)
Patient had had x4 days of ABTx.   Per PCP, Sx sound more like nerve issue in arm than allergic reaction.   Advised to stop ABTx and schedule appointment with PCP if Sx do not resolve.   Call placed to patient and patient made aware.

## 2021-06-13 NOTE — Telephone Encounter (Signed)
-----   Message from Billey Co, MD sent at 06/13/2021  3:17 PM EDT ----- Good news, no cancer cells on urine specimen.  Please set her up for BCG x3 now, followed by cystoscopy in 3 months  [name], MD 06/13/2021

## 2021-06-13 NOTE — Telephone Encounter (Signed)
Received call from patient.   Reports that since she has been taking Cipro for UTI, she has been having nerve pain and numbness in her L arm. States that she will wake up without any issues, but after taking ABTx, she will notice pain, tingling and numbness in her fingertips radiating up her hand/ wrist.  Reports that this will improve until she takes 2cd dose for the day and Sx will return.   States that she woke up today and noted upper arm was very stiff and swollen. States that she had pain in upper L arm.   Patient denies neck or shoulder pain.   Please advise.

## 2021-06-13 NOTE — Telephone Encounter (Signed)
Patient notified and scheduled for Maint BCG treatments x3, cysto scheduled in 65months post BCG instillations

## 2021-06-16 ENCOUNTER — Other Ambulatory Visit: Payer: Self-pay

## 2021-06-16 MED ORDER — CLOPIDOGREL BISULFATE 75 MG PO TABS: 75.0000 mg | ORAL_TABLET | Freq: Every day | ORAL | 3 refills | Status: DC

## 2021-06-18 ENCOUNTER — Other Ambulatory Visit: Payer: Medicare Other | Admitting: Urology

## 2021-06-18 LAB — URINALYSIS, COMPLETE
Bilirubin, UA: NEGATIVE
Glucose, UA: NEGATIVE
Ketones, UA: NEGATIVE
Leukocytes,UA: NEGATIVE
Nitrite, UA: NEGATIVE
Protein,UA: NEGATIVE
RBC, UA: NEGATIVE
Specific Gravity, UA: 1.02 (ref 1.005–1.030)
Urobilinogen, Ur: 0.2 mg/dL (ref 0.2–1.0)
pH, UA: 5.5 (ref 5.0–7.5)

## 2021-06-18 LAB — MICROSCOPIC EXAMINATION
Bacteria, UA: NONE SEEN
Epithelial Cells (non renal): NONE SEEN /hpf (ref 0–10)

## 2021-06-19 ENCOUNTER — Other Ambulatory Visit: Payer: Self-pay

## 2021-06-19 ENCOUNTER — Other Ambulatory Visit: Payer: Self-pay | Admitting: *Deleted

## 2021-06-19 ENCOUNTER — Ambulatory Visit (INDEPENDENT_AMBULATORY_CARE_PROVIDER_SITE_OTHER): Payer: Medicare Other | Admitting: Physician Assistant

## 2021-06-19 DIAGNOSIS — J441 Chronic obstructive pulmonary disease with (acute) exacerbation: Secondary | ICD-10-CM

## 2021-06-19 DIAGNOSIS — J302 Other seasonal allergic rhinitis: Secondary | ICD-10-CM

## 2021-06-19 DIAGNOSIS — F339 Major depressive disorder, recurrent, unspecified: Secondary | ICD-10-CM

## 2021-06-19 DIAGNOSIS — N3281 Overactive bladder: Secondary | ICD-10-CM

## 2021-06-19 DIAGNOSIS — C679 Malignant neoplasm of bladder, unspecified: Secondary | ICD-10-CM

## 2021-06-19 MED ORDER — BCG LIVE 50 MG IS SUSR
3.2400 mL | Freq: Once | INTRAVESICAL | Status: AC
Start: 2021-06-19 — End: 2021-06-19
  Administered 2021-06-19: 81 mg via INTRAVESICAL

## 2021-06-19 MED ORDER — CLOPIDOGREL BISULFATE 75 MG PO TABS: 75.0000 mg | ORAL_TABLET | Freq: Every day | ORAL | 3 refills | Status: DC

## 2021-06-19 MED ORDER — GEMTESA 75 MG PO TABS: 75.0000 mg | ORAL_TABLET | Freq: Every day | ORAL | 0 refills | Status: DC

## 2021-06-19 MED ORDER — PANTOPRAZOLE SODIUM 40 MG PO TBEC: 40.0000 mg | DELAYED_RELEASE_TABLET | ORAL | 3 refills | Status: DC

## 2021-06-19 MED ORDER — TROSPIUM CHLORIDE ER 60 MG PO CP24: 60.0000 mg | ORAL_CAPSULE | Freq: Every day | ORAL | 11 refills | Status: DC

## 2021-06-19 MED ORDER — SERTRALINE HCL 100 MG PO TABS
150.0000 mg | ORAL_TABLET | Freq: Every day | ORAL | 3 refills | Status: DC
Start: 2021-06-19 — End: 2022-06-19

## 2021-06-19 MED ORDER — ALBUTEROL SULFATE (2.5 MG/3ML) 0.083% IN NEBU: 2.5000 mg | INHALATION_SOLUTION | Freq: Four times a day (QID) | RESPIRATORY_TRACT | 1 refills | Status: DC | PRN

## 2021-06-19 MED ORDER — PRAVASTATIN SODIUM 20 MG PO TABS: 20.0000 mg | ORAL_TABLET | ORAL | 3 refills | Status: DC

## 2021-06-19 MED ORDER — ALBUTEROL SULFATE HFA 108 (90 BASE) MCG/ACT IN AERS: 2.0000 | INHALATION_SPRAY | Freq: Four times a day (QID) | RESPIRATORY_TRACT | 2 refills | Status: DC | PRN

## 2021-06-19 MED ORDER — LEVOTHYROXINE SODIUM 88 MCG PO TABS: 88.0000 ug | ORAL_TABLET | Freq: Every day | ORAL | 3 refills | Status: DC

## 2021-06-19 MED ORDER — MONTELUKAST SODIUM 10 MG PO TABS: ORAL_TABLET | ORAL | 1 refills | Status: DC

## 2021-06-19 NOTE — Patient Instructions (Signed)
Right now through 5pm: Hold your urine and do your quarter turns every 15 minutes. 5pm-11pm today: Every time you urinate, pour 1/2 cup of bleach into the toilet and let it sit for 15 minutes prior to flushing. 11pm onward: Resume your normal routine.   Patient Education: (BCG) Into the Bladder (Intravesical Chemotherapy)  BCG is a vaccine which is used to prevent tuberculosis (TB).  But it's also a helpful treatment for some early bladder cancers.  When BCG goes directly into the bladder the treatment is described as intravesical.  BCG is a type of immunotherapy.  Immunotherapy stimulates the body's immune system to destroy cancer cells.  How it's given BCG treatment is given to you in an outpatient setting.  It takes a few minutes to administer and you can go home as soon as it's finished.  It might be a good idea to ask someone to bring you, particularly the fist time.  Unlike chemotherapy into the bladder, BCG treatment is never given immediately after surgery to remove bladder tumors.  There needs to be a delay usually of at least two weeks after surgery, before you can have it.  You won't be given treatment with BCG if you are unwell or have an infection in your urine.  You're usually asked to limit the amount you drink before your treatment.  This will help to increase the concentration of BCG in your bladder.  Drinking too much before your treatment may make your bladder feel uncomfortably full.  If you normally take water tablets (diuretics) take them later in the day after your treatment.  Your nurse or doctor will give you more advise about preparing for your treatment.  You will have a small tube (catheter) placed into your bladder.  Your doctor will then put the liquid vaccine directly into your bladder through the catheter and remove the catheter.  You will need to hold your urine for two hours afterwards.  This can be difficult but it's to give the treatment time to work.  You can  walk around during this time.  When the treatment is over you can go to the toilet.  After your treatment there are some precautions you'll need to take.  This is because BCG is a live vaccine and other people shouldn't be exposed to it.  For the next six hours, you'll need to avoid your urine splashing on the toilet seat and getting any urine on your hands.  It might be easer for men to sit down when they're using an ordinary toilet although using a stand up urinal should be alright.  The main this is to avoid splashing urine and spreading the vaccine.  You will also be asked to put 1/2 cup undiluted bleach into the toilet to destroy any live vaccine and leave it for 15 minutes until you flush.  Side Effects Because BCG goes directly into the bladder most of the side effects are linked with the bladder.  They usually go away within one to two days after your treatment.  The most common ones are: -needing to pass urine often -pain when you pass urine -blood in urine -flu-like symptoms (tiredness, general aching and raised temperature)  Theses side effects should settle down within a day or two.  If they don't get better contact your doctor.  Drinking lots of fluids can help flush the drug out of your bladder and reduce some of these effects.  Taking Ibuprofen or Aleve is encouraged unless you have a  condition that would make these medications unsafe to take (renal failure, diabetes, gerd)  Rare side effects can include a continuing high temperature (fever), pain in your joints and a cough.  If you have any of these symptoms, or if you feel generally unwell, contact your doctor.  These symptoms could be a sign of a more serious infection (due to BCG) that needs to be treated immediately.  If this happens you'll be treated with the same drugs (antibiotics) that are used to treat TB.  Contraception Men should use a condom during sex for the first 48 hours after their treatment.  If you are a women who  has had BCG treatment then your partner should use a condom.  Using a condom will protect your partner from any vaccine present in your semen or vaginal fluid.  We don't know how BCG may affect a developing fetus so it's not advisable to become pregnant or father a child while having it.  It is important to use effective contraception during your treatment and for six weeks afterwards.  You can discuss this with your doctor or specialist nurse.

## 2021-06-19 NOTE — Progress Notes (Signed)
BCG Bladder Instillation  BCG # 1 of 3  Due to Bladder Cancer patient is present today for a BCG treatment. Patient was cleaned and prepped in a sterile fashion with betadine. A 14FR catheter was inserted, urine return was noted 188ml, urine was yellow in color.  91ml of reconstituted BCG was instilled into the bladder. The catheter was then removed. Patient tolerated well, no complications were noted  Performed by: Debroah Loop, PA-C and Bradly Bienenstock, CMA  Additional notes: Sending previously-prescribed trospium to [facility]; patient reports [facility] will not fill her long-term prescriptions anymore. Also provided 1 week of Gemtesa samples today for combo therapy. Hope to provide additional samples next week if restocked by that time.  Follow up: 1 week for BCG #2 of 3

## 2021-06-20 ENCOUNTER — Other Ambulatory Visit: Payer: Self-pay | Admitting: *Deleted

## 2021-06-20 ENCOUNTER — Other Ambulatory Visit: Payer: Self-pay

## 2021-06-20 LAB — MICROSCOPIC EXAMINATION
Bacteria, UA: NONE SEEN
RBC, Urine: NONE SEEN /hpf (ref 0–2)

## 2021-06-20 LAB — URINALYSIS, COMPLETE
Bilirubin, UA: NEGATIVE
Glucose, UA: NEGATIVE
Leukocytes,UA: NEGATIVE
Nitrite, UA: NEGATIVE
Protein,UA: NEGATIVE
RBC, UA: NEGATIVE
Specific Gravity, UA: 1.02 (ref 1.005–1.030)
Urobilinogen, Ur: 0.2 mg/dL (ref 0.2–1.0)
pH, UA: 5.5 (ref 5.0–7.5)

## 2021-06-20 MED ORDER — ESZOPICLONE 3 MG PO TABS: ORAL_TABLET | ORAL | Status: DC

## 2021-06-20 MED ORDER — ALBUTEROL SULFATE HFA 108 (90 BASE) MCG/ACT IN AERS: 2.0000 | INHALATION_SPRAY | Freq: Four times a day (QID) | RESPIRATORY_TRACT | 2 refills | Status: DC | PRN

## 2021-06-20 NOTE — Telephone Encounter (Signed)
Received call from patient.   Patient is switching pharmacies and requested refill on Lunesta.   Ok to refill??  Last office visit 05/22/2021.  Last refill 05/29/2021.  Ok to add refills to prescription?

## 2021-06-26 ENCOUNTER — Other Ambulatory Visit: Payer: Self-pay | Admitting: *Deleted

## 2021-06-26 ENCOUNTER — Ambulatory Visit (INDEPENDENT_AMBULATORY_CARE_PROVIDER_SITE_OTHER): Payer: Medicare Other | Admitting: Physician Assistant

## 2021-06-26 ENCOUNTER — Other Ambulatory Visit: Payer: Self-pay

## 2021-06-26 VITALS — BP 137/82 | HR 62 | Wt 167.0 lb

## 2021-06-26 DIAGNOSIS — N3281 Overactive bladder: Secondary | ICD-10-CM

## 2021-06-26 DIAGNOSIS — C679 Malignant neoplasm of bladder, unspecified: Secondary | ICD-10-CM | POA: Diagnosis not present

## 2021-06-26 MED ORDER — BCG LIVE 50 MG IS SUSR
3.2400 mL | Freq: Once | INTRAVESICAL | Status: AC
  Administered 2021-06-26: 81 mg via INTRAVESICAL

## 2021-06-26 MED ORDER — GEMTESA 75 MG PO TABS: 75.0000 mg | ORAL_TABLET | Freq: Every day | ORAL | 0 refills | Status: DC

## 2021-06-26 NOTE — Progress Notes (Signed)
BCG Bladder Instillation  BCG # 2 of 3  Due to Bladder Cancer patient is present today for a BCG treatment. Patient was cleaned and prepped in a sterile fashion with betadine. A 14FR catheter was inserted, urine return was noted 124ml, urine was yellow in color.  29ml of reconstituted BCG was instilled into the bladder. The catheter was then removed. Patient tolerated well, no complications were noted  Performed by: Debroah Loop, PA-C and Kerman Passey, CMA  Additional notes: 2 weeks of Gemtesa samples provided today  Follow up: 1 week for BCG #3 of 3

## 2021-06-27 LAB — URINALYSIS, COMPLETE
Bilirubin, UA: NEGATIVE
Glucose, UA: NEGATIVE
Ketones, UA: NEGATIVE
Leukocytes,UA: NEGATIVE
Nitrite, UA: NEGATIVE
Protein,UA: NEGATIVE
RBC, UA: NEGATIVE
Specific Gravity, UA: 1.02 (ref 1.005–1.030)
Urobilinogen, Ur: 0.2 mg/dL (ref 0.2–1.0)
pH, UA: 6.5 (ref 5.0–7.5)

## 2021-06-27 LAB — MICROSCOPIC EXAMINATION
Bacteria, UA: NONE SEEN
RBC, Urine: NONE SEEN /hpf (ref 0–2)

## 2021-07-03 ENCOUNTER — Ambulatory Visit: Payer: Medicare Other | Admitting: Physician Assistant

## 2021-07-03 ENCOUNTER — Other Ambulatory Visit: Payer: Self-pay

## 2021-07-03 ENCOUNTER — Ambulatory Visit (INDEPENDENT_AMBULATORY_CARE_PROVIDER_SITE_OTHER): Payer: Medicare Other | Admitting: Physician Assistant

## 2021-07-03 DIAGNOSIS — C679 Malignant neoplasm of bladder, unspecified: Secondary | ICD-10-CM

## 2021-07-03 DIAGNOSIS — D494 Neoplasm of unspecified behavior of bladder: Secondary | ICD-10-CM | POA: Diagnosis not present

## 2021-07-03 MED ORDER — BCG LIVE 50 MG IS SUSR
3.2400 mL | Freq: Once | INTRAVESICAL | Status: AC
  Administered 2021-07-03: 81 mg via INTRAVESICAL

## 2021-07-03 NOTE — Progress Notes (Signed)
BCG Bladder Instillation  BCG # 3 of 3  Due to Bladder Cancer patient is present today for a BCG treatment. Patient was cleaned and prepped in a sterile fashion with betadine. A 14FR catheter was inserted, urine return was noted 54ml, urine was yellow in color.  80ml of reconstituted BCG was instilled into the bladder. The catheter was then removed. Patient tolerated well, no complications were noted  Performed by: Debroah Loop, PA-C and Bradly Bienenstock, CMA  Follow up/ Additional notes: 3 months for cystoscopy with Dr. Diamantina Providence

## 2021-07-04 LAB — MICROSCOPIC EXAMINATION: RBC, Urine: NONE SEEN /hpf (ref 0–2)

## 2021-07-04 LAB — URINALYSIS, COMPLETE
Bilirubin, UA: NEGATIVE
Glucose, UA: NEGATIVE
Ketones, UA: NEGATIVE
Leukocytes,UA: NEGATIVE
Nitrite, UA: NEGATIVE
Protein,UA: NEGATIVE
RBC, UA: NEGATIVE
Specific Gravity, UA: 1.025 (ref 1.005–1.030)
Urobilinogen, Ur: 0.2 mg/dL (ref 0.2–1.0)
pH, UA: 6 (ref 5.0–7.5)

## 2021-07-08 ENCOUNTER — Encounter: Payer: Self-pay | Admitting: Family Medicine

## 2021-07-08 ENCOUNTER — Ambulatory Visit: Payer: Medicare Other | Admitting: Physician Assistant

## 2021-07-08 ENCOUNTER — Other Ambulatory Visit: Payer: Self-pay

## 2021-07-08 ENCOUNTER — Ambulatory Visit (INDEPENDENT_AMBULATORY_CARE_PROVIDER_SITE_OTHER): Payer: Medicare Other | Admitting: Family Medicine

## 2021-07-08 VITALS — BP 134/88 | HR 60 | Temp 98.1°F | Resp 16 | Ht 62.0 in | Wt 170.0 lb

## 2021-07-08 DIAGNOSIS — R42 Dizziness and giddiness: Secondary | ICD-10-CM | POA: Diagnosis not present

## 2021-07-08 DIAGNOSIS — R300 Dysuria: Secondary | ICD-10-CM | POA: Diagnosis not present

## 2021-07-08 LAB — URINALYSIS, ROUTINE W REFLEX MICROSCOPIC
Bilirubin Urine: NEGATIVE
Glucose, UA: NEGATIVE
Ketones, ur: NEGATIVE
Nitrite: NEGATIVE
Specific Gravity, Urine: 1.025 (ref 1.001–1.035)
pH: 7 (ref 5.0–8.0)

## 2021-07-08 LAB — MICROSCOPIC MESSAGE

## 2021-07-08 MED ORDER — MECLIZINE HCL 25 MG PO TABS: 12.5000 mg | ORAL_TABLET | Freq: Three times a day (TID) | ORAL | 0 refills | Status: AC | PRN

## 2021-07-08 MED ORDER — DICLOFENAC SODIUM 1 % EX GEL: 2.0000 g | Freq: Four times a day (QID) | CUTANEOUS | 3 refills | Status: DC

## 2021-07-08 MED ORDER — SULFAMETHOXAZOLE-TRIMETHOPRIM 800-160 MG PO TABS: 1.0000 | ORAL_TABLET | Freq: Two times a day (BID) | ORAL | 0 refills | Status: DC

## 2021-07-08 NOTE — Progress Notes (Signed)
Subjective:    Patient ID: Alexa Orozco, female    DOB: [birth year], 75 y.o.   MRN: [id number]  HPI Patient has a history of bladder cancer.  She is currently receiving intravesical chemotherapy.  She has received 9 treatments.  After each treatment she does have mild dysuria.  However for the last week, she has had progressively worsening dysuria with frequency urgency and hesitancy.  She also reports low back pain but denies fevers.  Urinalysis today shows +2 blood, negative nitrates, +1 leukocyte esterase Past Medical History:  Diagnosis Date   Allergies    Allergy    Anxiety    Asthma    Bladder cancer ([place])    CAD S/P percutaneous coronary angioplasty 10/2005   Class III Angina --> Cath: 85% -- PCI 3.87mm x 42mm (4.0 mm) Cypher DES to RCA; Myoview October 2014: normal stress test: LOW RISK; mild anteroapical breast attenuation   Cataract    h/o bilat repair   Claudication ([place]) 06/01/2013   COPD (chronic obstructive pulmonary disease) ([place])    Depression 11/29/2013   Emphysema of lung ([facility])    GERD (gastroesophageal reflux disease)    History of palpitations    History of tobacco abuse    Hyperlipidemia    Hypertension    well-controlled   Hypothyroidism    Osteoporosis    Raynaud disease    Past Surgical History:  Procedure Laterality Date   ABDOMINAL HYSTERECTOMY     h/o partila hysterectomy   BREAST BIOPSY  1988   CORONARY ANGIOPLASTY WITH STENT PLACEMENT  10/14/2005   cypher DES (3.103mmx18mm) to high grade RCA lesion   NM MYOVIEW LTD  July 2012 of October 2014   '12: dipyridamole; Normal, low risk study; 2014 - normal stress test: LOW RISK; mild anteroapical breast attenuation   TONSILLECTOMY  1959   TRANSTHORACIC ECHOCARDIOGRAM  05/22/2009   EF=>55%, normal LV systolic function; normal RV systolic function; mild MR/TR   TRANSURETHRAL RESECTION OF BLADDER TUMOR N/A 11/08/2020   Procedure: TRANSURETHRAL RESECTION OF BLADDER TUMOR (TURBT);  Surgeon: Billey Co, MD;   Location: [facility] ORS;  Service: Urology;  Laterality: N/A;   TRANSURETHRAL RESECTION OF BLADDER TUMOR N/A 12/13/2020   Procedure: TRANSURETHRAL RESECTION OF BLADDER TUMOR (TURBT);  Surgeon: Billey Co, MD;  Location: [facility] ORS;  Service: Urology;  Laterality: N/A;   Current Outpatient Medications on File Prior to Visit  Medication Sig Dispense Refill   acetaminophen (TYLENOL) 500 MG tablet Take 500-1,000 mg by mouth every 6 (six) hours as needed (pain).     albuterol (PROVENTIL) (2.5 MG/3ML) 0.083% nebulizer solution Take 3 mLs (2.5 mg total) by nebulization every 6 (six) hours as needed for wheezing or shortness of breath. 150 mL 1   albuterol (VENTOLIN HFA) 108 (90 Base) MCG/ACT inhaler Inhale 2 puffs into the lungs every 6 (six) hours as needed for wheezing or shortness of breath. Please dispense as Proair 18 g 2   aspirin EC 81 MG EC tablet Take 1 tablet (81 mg total) by mouth daily. Swallow whole. 30 tablet 11   clopidogrel (PLAVIX) 75 MG tablet Take 1 tablet (75 mg total) by mouth daily. 90 tablet 3   cromolyn (OPTICROM) 4 % ophthalmic solution Place 1 drop into both eyes 4 (four) times daily.     Eszopiclone 3 MG TABS TAKE 1 TABLET BY MOUTH IMMEDIATELY BEFORE BEDTIME 30 tablet 03   hydrALAZINE (APRESOLINE) 50 MG tablet Take 1 tablet (50 mg total) by mouth every  6 (six) hours. 120 tablet 0   levothyroxine (SYNTHROID) 88 MCG tablet Take 1 tablet (88 mcg total) by mouth daily before breakfast. 90 tablet 3   montelukast (SINGULAIR) 10 MG tablet TAKE 1 TABLET BY MOUTH EVERYDAY AT BEDTIME 90 tablet 1   nitroGLYCERIN (NITROSTAT) 0.4 MG SL tablet Place 1 tablet (0.4 mg total) under the tongue every 5 (five) minutes as needed for chest pain. 25 tablet 6   pantoprazole (PROTONIX) 40 MG tablet Take 1 tablet (40 mg total) by mouth every morning. 90 tablet 3   pravastatin (PRAVACHOL) 20 MG tablet Take 1 tablet (20 mg total) by mouth every morning. 90 tablet 3   sertraline (ZOLOFT) 100 MG tablet Take  1.5 tablets (150 mg total) by mouth daily. 135 tablet 3   Tiotropium Bromide Monohydrate (SPIRIVA RESPIMAT) 2.5 MCG/ACT AERS Inhale into the lungs.     Trospium Chloride 60 MG CP24 Take 1 capsule (60 mg total) by mouth daily at 2 PM. 30 capsule 11   Vibegron (GEMTESA) 75 MG TABS Take 75 mg by mouth daily. 14 tablet 0   No current facility-administered medications on file prior to visit.   Allergies  Allergen Reactions   Bystolic [Nebivolol Hcl] Hives   Cephalexin Hives   Effexor [Venlafaxine] Other (See Comments)    Made her face hot and red   Levaquin [Levofloxacin In D5w] Other (See Comments)    Caused face to turn red and patient became extremely hot    Social History   Socioeconomic History   Marital status: Widowed    Spouse name: Not on file   Number of children: 3   Years of education: 10th    Highest education level: Not on file  Occupational History   Occupation: Retired  Tobacco Use   Smoking status: Every Day    Packs/day: 0.50    Years: 35.00    Pack years: 17.50    Types: Cigarettes   Smokeless tobacco: Never   Tobacco comments:    11/11/18 still at 1/2ppd  Vaping Use   Vaping Use: Never used  Substance and Sexual Activity   Alcohol use: No   Drug use: No   Sexual activity: Not on file  Other Topics Concern   Not on file  Social History Narrative   She is a widowed mother of 26, grandmother for, great-grandmother 67.  She still smokes about half pack a day.  She doesn't have about 35 years.  She does not drink.   Prior to her symptoms coming on, she used to do all kind of walking around and working in the garden and other activities with her close friend.   Social Determinants of Health   Financial Resource Strain: Low Risk    Difficulty of Paying Living Expenses: Not hard at all  Food Insecurity: No Food Insecurity   Worried About Charity fundraiser in the Last Year: Never true   [place] in the Last Year: Never true  Transportation Needs: No  Transportation Needs   Lack of Transportation (Medical): No   Lack of Transportation (Non-Medical): No  Physical Activity: Inactive   Days of Exercise per Week: 0 days   Minutes of Exercise per Session: 0 min  Stress: No Stress Concern Present   Feeling of Stress : Not at all  Social Connections: Moderately Isolated   Frequency of Communication with Friends and Family: More than three times a week   Frequency of Social Gatherings with Friends and  Family: More than three times a week   Attends Religious Services: More than 4 times per year   Active Member of Clubs or Organizations: No   Attends Archivist Meetings: Never   Marital Status: Widowed  Human resources officer Violence: Not At Risk   Fear of Current or Ex-Partner: No   Emotionally Abused: No   Physically Abused: No   Sexually Abused: No      Review of Systems  All other systems reviewed and are negative.     Objective:   Physical Exam Vitals reviewed.  Constitutional:      General: She is not in acute distress.    Appearance: Normal appearance. She is normal weight. She is not ill-appearing or toxic-appearing.  HENT:     Head: Normocephalic and atraumatic.     Right Ear: Tympanic membrane and ear canal normal.     Left Ear: Tympanic membrane and ear canal normal.  Cardiovascular:     Rate and Rhythm: Normal rate and regular rhythm.     Heart sounds: Normal heart sounds.  Pulmonary:     Effort: Pulmonary effort is normal.     Breath sounds: No wheezing, rhonchi or rales.  Abdominal:     General: Bowel sounds are normal.     Palpations: Abdomen is soft.     Tenderness: There is no abdominal tenderness.  Lymphadenopathy:     Cervical: No cervical adenopathy.  Neurological:     Mental Status: She is alert.          Assessment & Plan:  Dysuria - Plan: Urine Culture, CANCELED: DRUG MONITOR, PANEL 1, W/CONF, URINE  Vertigo - Plan: meclizine (ANTIVERT) 25 MG tablet I suspect the patient could be  having dysuria from the chemotherapy and bladder wall irritation although I cannot rule out a urinary tract infection.  I will send a culture to evaluate further.  I recommended the patient take Azo for symptomatic relief.  However the patient still elects to want to take an antibiotic because she feels that this is likely an infection.  So we will start Bactrim double strength tablets twice daily for 5 days and await the results of the culture.  If the culture is negative we will discontinue antibiotics.  She also complains of some pain at the base of her right thumb at the [facility] joint.  We will try treating this with topical Voltaren 4 times daily

## 2021-07-08 NOTE — Addendum Note (Signed)
Addended by: Sheral Flow on: 07/08/2021 04:34 PM   Modules accepted: Orders

## 2021-07-09 ENCOUNTER — Other Ambulatory Visit: Payer: Self-pay | Admitting: Family Medicine

## 2021-07-09 ENCOUNTER — Telehealth: Payer: Self-pay | Admitting: Physician Assistant

## 2021-07-09 DIAGNOSIS — E2839 Other primary ovarian failure: Secondary | ICD-10-CM

## 2021-07-09 NOTE — Telephone Encounter (Signed)
Patient is requesting a [facility] [place] [facility] [address].  She is asking if it could be sent for 90 days

## 2021-07-10 ENCOUNTER — Other Ambulatory Visit: Payer: Self-pay | Admitting: *Deleted

## 2021-07-10 ENCOUNTER — Other Ambulatory Visit: Payer: Self-pay | Admitting: Physician Assistant

## 2021-07-10 DIAGNOSIS — N3281 Overactive bladder: Secondary | ICD-10-CM

## 2021-07-10 DIAGNOSIS — C679 Malignant neoplasm of bladder, unspecified: Secondary | ICD-10-CM

## 2021-07-10 LAB — URINE CULTURE
MICRO NUMBER:: 12609078
SPECIMEN QUALITY:: ADEQUATE

## 2021-07-10 MED ORDER — TRELEGY ELLIPTA 100-62.5-25 MCG/ACT IN AEPB: 1.0000 | INHALATION_SPRAY | Freq: Every day | RESPIRATORY_TRACT | 11 refills | Status: DC

## 2021-07-10 MED ORDER — CLOPIDOGREL BISULFATE 75 MG PO TABS: 75.0000 mg | ORAL_TABLET | Freq: Every day | ORAL | 3 refills | Status: DC

## 2021-07-10 MED ORDER — TROSPIUM CHLORIDE ER 60 MG PO CP24: 60.0000 mg | ORAL_CAPSULE | Freq: Every day | ORAL | 3 refills | Status: DC

## 2021-07-10 NOTE — Telephone Encounter (Signed)
Patient notified

## 2021-07-11 ENCOUNTER — Telehealth: Payer: Self-pay

## 2021-07-11 ENCOUNTER — Inpatient Hospital Stay: Admission: RE | Admit: 2021-07-11 | Payer: Medicare Other | Source: Ambulatory Visit

## 2021-07-11 NOTE — Telephone Encounter (Signed)
Plavix has been sent to pharmacy.   Please see labs for more information.

## 2021-07-11 NOTE — Telephone Encounter (Signed)
Pt called in inquiring about lab results, and also to check on plavix. Please advise.  Cb#: [phone]

## 2021-07-11 NOTE — Telephone Encounter (Signed)
Called pt, no answer, LVM to return call.

## 2021-07-26 ENCOUNTER — Other Ambulatory Visit: Payer: Self-pay | Admitting: Family Medicine

## 2021-07-26 DIAGNOSIS — J441 Chronic obstructive pulmonary disease with (acute) exacerbation: Secondary | ICD-10-CM

## 2021-07-28 ENCOUNTER — Telehealth: Payer: Self-pay

## 2021-07-28 ENCOUNTER — Other Ambulatory Visit: Payer: Self-pay | Admitting: Family Medicine

## 2021-07-28 DIAGNOSIS — M19049 Primary osteoarthritis, unspecified hand: Secondary | ICD-10-CM

## 2021-07-28 NOTE — Telephone Encounter (Signed)
Pt called and asked which hand specialist you would recommend. Pt states you had advised she could see someone if the pain did not improve. Please advise, thanks!

## 2021-07-28 NOTE — Telephone Encounter (Signed)
Spoke with pt and advised of referral and that someone would be contacting her to schedule. Pt voiced understanding, nothing further needed.

## 2021-08-18 DIAGNOSIS — M79642 Pain in left hand: Secondary | ICD-10-CM | POA: Diagnosis not present

## 2021-08-18 DIAGNOSIS — M180 Bilateral primary osteoarthritis of first carpometacarpal joints: Secondary | ICD-10-CM | POA: Diagnosis not present

## 2021-08-18 DIAGNOSIS — M79641 Pain in right hand: Secondary | ICD-10-CM | POA: Diagnosis not present

## 2021-08-19 ENCOUNTER — Other Ambulatory Visit: Payer: Self-pay | Admitting: Family Medicine

## 2021-08-19 ENCOUNTER — Telehealth: Payer: Self-pay

## 2021-08-19 ENCOUNTER — Telehealth: Payer: Self-pay | Admitting: Family Medicine

## 2021-08-19 MED ORDER — CLONAZEPAM 0.5 MG PO TABS: 0.5000 mg | ORAL_TABLET | Freq: Two times a day (BID) | ORAL | 0 refills | Status: DC | PRN

## 2021-08-19 NOTE — Chronic Care Management (AMB) (Signed)
° °  Chronic Care Management   Outreach Note  08/19/2021 Name: Alexa Orozco MRN: [id number] DOB: October 30, 1945  Referred by: Susy Frizzle, MD Reason for referral : No chief complaint on file.   A second unsuccessful telephone outreach was attempted today. The patient was referred to pharmacist for assistance with care management and care coordination.  Follow Up Plan:   Tatjana Dellinger Upstream Scheduler

## 2021-08-19 NOTE — Progress Notes (Deleted)
Chronic Care Management Pharmacy Note  08/19/2021 Name:  Alexa Orozco MRN:  [id number] DOB:  1945/10/22  Summary: ***  Recommendations/Changes made from today's visit: ***  Plan: ***   Subjective: Alexa Orozco is an 75 y.o. year old female who is a primary patient of Pickard, Cammie Mcgee, MD.  The CCM team was consulted for assistance with disease management and care coordination needs.    Engaged with patient by telephone for initial visit in response to provider referral for pharmacy case management and/or care coordination services.   Consent to Services:  The patient was given the following information about Chronic Care Management services today, agreed to services, and gave verbal consent: 1. CCM service includes personalized support from designated clinical staff supervised by the primary care provider, including individualized plan of care and coordination with other care providers 2. 24/7 contact phone numbers for assistance for urgent and routine care needs. 3. Service will only be billed when office clinical staff spend 20 minutes or more in a month to coordinate care. 4. Only one practitioner may furnish and bill the service in a calendar month. 5.The patient may stop CCM services at any time (effective at the end of the month) by phone call to the office staff. 6. The patient will be responsible for cost sharing (co-pay) of up to 20% of the service fee (after annual deductible is met). Patient agreed to services and consent obtained.  Patient Care Team: Susy Frizzle, MD as PCP - General (Family Medicine) Leonie Man, MD as PCP - Cardiology (Cardiology) Edythe Clarity, [employer] (Pharmacist)  Recent office visits:  07/08/2021 OV (PCP) Susy Frizzle, MD; Bactrim twice daily for 5 days, Azo for symptom relief. Topical Voltaren gel 4 times daily for right thumb pain.   05/22/2021 OV (PCP) Susy Frizzle, MD;  would like to maximize therapy for COPD in an effort to try  to reduce any cardiac strain.  Therefore I will place her on Trelegy 1 inhalation a day.  Of asked her to discontinue Spiriva.  She is not taking enalapril.  She had to discontinue hydralazine due to an allergic reaction.  However her blood pressure here today is excellent.  Therefore I do not feel that she needs to replace this with any other blood pressure medication at the present time.   05/09/2021 OV (PCP) Susy Frizzle, MD;  Begin a prednisone taper pack coupled with albuterol 2 puffs every 4-6 hours as needed.  The question is whether this is bacterial induced versus COVID-pneumonia.  My COVID test will not come back until Monday.  The patient has a friend who has a home COVID test.  Therefore of asked her to go home and take the home COVID test.  If positive I will call out paxlovid in addition to the prednisone.  If negative, I want the patient to take a Z-Pak in addition to her prednisone   Recent consult visits:  08/18/2021 OV (Orthopedics) Sheran Luz, MD; no further information available.   Hospital visits:  05/12/2021 ED to Hospital Admission at [facility] for NSTEMI   Admit date: 05/12/2021 Discharge date: 05/16/2021   Stop taking these medications: Clonazepam 0.5 mg tablet Tizanidine 4 mg tablet Trospium Chloride 60 mg   Started on enalapril and hydralazine for hypertension   Objective:  Lab Results  Component Value Date   CREATININE 0.96 05/22/2021   BUN 25 05/22/2021   GFRNONAA >60 05/14/2021   GFRAA 67  02/06/2021   NA 141 05/22/2021   K 5.1 05/22/2021   CALCIUM 9.7 05/22/2021   CO2 26 05/22/2021   GLUCOSE 87 05/22/2021    No results found for: HGBA1C, FRUCTOSAMINE, GFR, MICROALBUR  Last diabetic Eye exam: No results found for: HMDIABEYEEXA  Last diabetic Foot exam: No results found for: HMDIABFOOTEX   Lab Results  Component Value Date   CHOL 204 (H) 05/13/2021   HDL 75 05/13/2021   LDLCALC 110 (H) 05/13/2021   TRIG 97  05/13/2021   CHOLHDL 2.7 05/13/2021    Hepatic Function Latest Ref Rng & Units 05/22/2021 05/15/2021 05/14/2021  Total Protein 6.1 - 8.1 g/dL 6.7 6.3(L) 6.4(L)  Albumin 3.5 - 5.0 g/dL - 3.0(L) 3.1(L)  AST 10 - 35 U/L 21 34 52(H)  ALT 6 - 29 U/L 34(H) 49(H) 57(H)  Alk Phosphatase 38 - 126 U/L - 92 94  Total Bilirubin 0.2 - 1.2 mg/dL 0.4 0.6 0.7  Bilirubin, Direct 0.0 - 0.2 mg/dL - <0.1 <0.1    Lab Results  Component Value Date/Time   TSH 3.82 02/06/2021 03:33 PM   TSH 3.31 05/31/2020 12:07 PM    CBC Latest Ref Rng & Units 05/27/2021 05/22/2021 05/15/2021  WBC 3.8 - 10.8 Thousand/uL 13.5(H) 14.1(H) 9.4  Hemoglobin 11.7 - 15.5 g/dL 11.8 11.7 13.2  Hematocrit 35.0 - 45.0 % 35.4 35.2 39.4  Platelets 140 - 400 Thousand/uL 300 344 392    Lab Results  Component Value Date/Time   VD25OH 19 (L) 02/08/2019 09:09 AM    Clinical ASCVD: {YES/NO:21197} The ASCVD Risk score (Arnett DK, et al., 2019) failed to calculate for the following reasons:   The patient has a prior MI or stroke diagnosis    Depression screen [facility] 2/9 01/02/2021 10/03/2020 03/09/2018  Decreased Interest 1 0 3  Down, Depressed, Hopeless 1 0 3  PHQ - 2 Score 2 0 6  Altered sleeping 3 - 3  Tired, decreased energy 3 - 3  Change in appetite 0 - 0  Feeling bad or failure about yourself  1 - 0  Trouble concentrating 0 - 3  Moving slowly or fidgety/restless 0 - 3  Suicidal thoughts 0 - 0  PHQ-9 Score 9 - 18  Difficult doing work/chores Not difficult at all - Not difficult at all  Some recent data might be hidden     ***Other: (CHADS2VASc if Afib, MMRC or CAT for COPD, ACT, DEXA)  Social History   Tobacco Use  Smoking Status Every Day   Packs/day: 0.50   Years: 35.00   Pack years: 17.50   Types: Cigarettes  Smokeless Tobacco Never  Tobacco Comments   11/11/18 still at 1/2ppd   BP Readings from Last 3 Encounters:  07/08/21 134/88  06/26/21 137/82  06/11/21 122/72   Pulse Readings from Last 3 Encounters:   07/08/21 60  06/26/21 62  06/11/21 60   Wt Readings from Last 3 Encounters:  07/08/21 170 lb (77.1 kg)  06/26/21 167 lb (75.8 kg)  05/22/21 167 lb (75.8 kg)   BMI Readings from Last 3 Encounters:  07/08/21 31.09 kg/m  06/26/21 30.54 kg/m  06/11/21 30.54 kg/m    Assessment/Interventions: Review of patient past medical history, allergies, medications, health status, including review of consultants reports, laboratory and other test data, was performed as part of comprehensive evaluation and provision of chronic care management services.   SDOH:  (Social Determinants of Health) assessments and interventions performed: {yes/no:20286}  SDOH Screenings   Alcohol Screen: Low Risk  Last Alcohol Screening Score (AUDIT): 0  Depression (PHQ2-9): Medium Risk   PHQ-2 Score: 9  Financial Resource Strain: Low Risk    Difficulty of Paying Living Expenses: Not hard at all  Food Insecurity: No Food Insecurity   Worried About Charity fundraiser in the Last Year: Never true   Ran Out of Food in the Last Year: Never true  Housing: Low Risk    Last Housing Risk Score: 0  Physical Activity: Inactive   Days of Exercise per Week: 0 days   Minutes of Exercise per Session: 0 min  Social Connections: Moderately Isolated   Frequency of Communication with Friends and Family: More than three times a week   Frequency of Social Gatherings with Friends and Family: More than three times a week   Attends Religious Services: More than 4 times per year   Active Member of [employer] or Organizations: No   Attends Archivist Meetings: Never   Marital Status: Widowed  Stress: No Stress Concern Present   Feeling of Stress : Not at all  Tobacco Use: High Risk   Smoking Tobacco Use: Every Day   Smokeless Tobacco Use: Never   Passive Exposure: Not on file  Transportation Needs: No Transportation Needs   Lack of Transportation (Medical): No   Lack of Transportation (Non-Medical): No    CCM Care  Plan  Allergies  Allergen Reactions   Bystolic [Nebivolol Hcl] Hives   Cephalexin Hives   Effexor [Venlafaxine] Other (See Comments)    Made her face hot and red   Levaquin [Levofloxacin In D5w] Other (See Comments)    Caused face to turn red and patient became extremely hot     Medications Reviewed Today     Reviewed by Six, Eden Lathe, LPN (Licensed Practical Nurse) on 07/08/21 at 1600  Med List Status: <None>   Medication Order Taking? Sig Documenting Provider Last Dose Status Informant  acetaminophen (TYLENOL) 500 MG tablet [id number] Yes Take 500-1,000 mg by mouth every 6 (six) hours as needed (pain). [provider] Taking Active Self  albuterol (PROVENTIL) (2.5 MG/3ML) 0.083% nebulizer solution [id number] Yes Take 3 mLs (2.5 mg total) by nebulization every 6 (six) hours as needed for wheezing or shortness of breath. Susy Frizzle, MD Taking Active   albuterol (VENTOLIN HFA) 108 (90 Base) MCG/ACT inhaler [id number] Yes Inhale 2 puffs into the lungs every 6 (six) hours as needed for wheezing or shortness of breath. Please dispense as Larwance Sachs, MD Taking Active   aspirin EC 81 MG EC tablet [id number] Yes Take 1 tablet (81 mg total) by mouth daily. Swallow whole. Barb Merino, MD Taking Active   clopidogrel (PLAVIX) 75 MG tablet [id number] Yes Take 1 tablet (75 mg total) by mouth daily. Susy Frizzle, MD Taking Active   cromolyn (OPTICROM) 4 % ophthalmic solution [id number] Yes Place 1 drop into both eyes 4 (four) times daily. [provider] Taking Active Self           Med Note Tamala Julian, Maurene Capes Jun 22, 2016  4:56 AM)    Eszopiclone 3 MG TABS [id number] Yes TAKE 1 TABLET BY MOUTH IMMEDIATELY BEFORE BEDTIME Susy Frizzle, MD Taking Active   Fluticasone-Umeclidin-Vilant (TRELEGY ELLIPTA) 100-62.5-25 MCG/INH AEPB [id number] Yes Inhale 1 puff into the lungs daily. Susy Frizzle, MD Taking Active   hydrALAZINE (APRESOLINE) 50 MG tablet  [id number]  Take 1 tablet (50 mg total) by mouth every 6 (six) hours.  Barb Merino, MD  Expired 06/15/21 2359   levothyroxine (SYNTHROID) 88 MCG tablet [id number] Yes Take 1 tablet (88 mcg total) by mouth daily before breakfast. Susy Frizzle, MD Taking Active   meclizine (ANTIVERT) 25 MG tablet [id number] Yes Take 0.5 tablets (12.5 mg total) by mouth 3 (three) times daily as needed for dizziness. Eulogio Bear, NP Taking Active   montelukast (SINGULAIR) 10 MG tablet [id number] Yes TAKE 1 TABLET BY MOUTH EVERYDAY AT BEDTIME Susy Frizzle, MD Taking Active   nitroGLYCERIN (NITROSTAT) 0.4 MG SL tablet [id number] Yes Place 1 tablet (0.4 mg total) under the tongue every 5 (five) minutes as needed for chest pain. Orlena Sheldon, PA-C Taking Active Self  pantoprazole (PROTONIX) 40 MG tablet [id number] Yes Take 1 tablet (40 mg total) by mouth every morning. Susy Frizzle, MD Taking Active   pravastatin (PRAVACHOL) 20 MG tablet [id number] Yes Take 1 tablet (20 mg total) by mouth every morning. Susy Frizzle, MD Taking Active   sertraline (ZOLOFT) 100 MG tablet [id number] Yes Take 1.5 tablets (150 mg total) by mouth daily. Susy Frizzle, MD Taking Active   Trospium Chloride 60 MG CP24 [id number] Yes Take 1 capsule (60 mg total) by mouth daily at 2 PM. Debroah Loop, PA-C Taking Active   Vibegron (GEMTESA) 75 MG TABS [id number] Yes Take 75 mg by mouth daily. Debroah Loop, PA-C Taking Active             Patient Active Problem List   Diagnosis Date Noted   NSTEMI (non-ST elevated myocardial infarction) ([place]) 05/12/2021   Bladder cancer ([place]) 05/12/2021   COVID-19 virus infection 05/12/2021   Acute on chronic respiratory failure with hypoxia ([place]) 05/12/2021   Insomnia 09/19/2014   Generalized anxiety disorder 09/19/2014   Depression 11/29/2013   Osteoporosis    Obesity (BMI 30-39.9) 05/31/2013   Stable angina ([place]) 05/31/2013   Claudication of left lower extremity  ([place]) 05/31/2013   CAD S/P percutaneous coronary angioplasty    Dyslipidemia    Hirsutism 03/03/2011   Essential hypertension    Hypothyroid    Raynaud's disease    COPD (chronic obstructive pulmonary disease) ([place])    Tobacco abuse     Immunization History  Administered Date(s) Administered   Fluad Quad(high Dose 65+) 05/16/2021   Influenza, High Dose Seasonal PF 05/24/2014, 05/11/2019   Influenza,inj,Quad PF,6+ Mos 06/20/2015   Pneumococcal Conjugate-13 11/29/2013   Pneumococcal Polysaccharide-23 12/23/2011   Tdap 12/23/2011   Zoster, Live 01/17/2013    Conditions to be addressed/monitored:  CAD, Angina, HTN, Hypothyroidism, Tobacco use, HLD, Depression, Insomnia, GAD  There are no care plans that you recently modified to display for this patient.    Medication Assistance: {MEDASSISTANCEINFO:25044}  Compliance/Adherence/Medication fill history: Care Gaps: ***  Star-Rating Drugs: ***  Patient's preferred pharmacy is:  [facility] [place] [facility] [address] [place] [address] Phone: [phone] Fax: [phone]  [id number] [address]: [phone]: [phone] Uses pill box? {Yes or If no, why not?:20788} Pt endorses ***% compliance  We discussed: {Pharmacy options:24294} Patient decided to: {US Pharmacy Plan:23885}  Care Plan and Follow Up Patient Decision:  {FOLLOWUP:24991}  Plan: {CM FOLLOW UP PTGPQ:98264} ***  Current Barriers:  {pharmacybarriers:24917}  Pharmacist Clinical Goal(s):  Patient will {PHARMACYGOALCHOICES:24921} through collaboration with PharmD and provider.  Interventions: 1:1 collaboration with Susy Frizzle, MD regarding development and update of comprehensive plan of care as evidenced by provider attestation and  co-signature Inter-disciplinary care team collaboration (see longitudinal plan of care) Comprehensive medication review performed; medication list updated in electronic medical record  Hypertension (BP goal {CHL HP UPSTREAM Pharmacist BP ranges:[phone]}) -{US controlled/uncontrolled:25276} -Current treatment: Hydralazine 57m q6h?? Enalapril?? -Medications previously tried: ***  -Current home readings: *** -Current dietary habits: *** -Current exercise habits: *** -{ACTIONS;DENIES/REPORTS:21021675::"Denies"} hypotensive/hypertensive symptoms -Educated on {CCM BP Counseling:25124} -Counseled to monitor BP at home ***, document, and provide log at future appointments -{CCMPHARMDINTERVENTION:25122}  Hyperlipidemia: (LDL goal < ***) -{US controlled/uncontrolled:25276} -Current treatment: Pravastatin 29mdaily -Medications previously tried: ***  -Current dietary patterns: *** -Current exercise habits: *** -Educated on {CCM HLD Counseling:25126} -{CCMPHARMDINTERVENTION:25122}  Depression/Anxiety (Goal: ***) -{US controlled/uncontrolled:25276} -Current treatment: Sertraline 100 mg daily -  1.5 tablets daily -Medications previously tried/failed: *** -PHQ9: *** -GAD7: *** -Connected with *** for mental health support -Educated on {CCM mental health counseling:25127} -{CCMPHARMDINTERVENTION:25122}  COPD (Goal: control symptoms and prevent exacerbations) -{US controlled/uncontrolled:25276} -Current treatment  Trelegy Ellipta 100-62.5-25 1 puff daily Spiriva 2.30m67m-Medications previously tried: ***  -Gold Grade: {CHL HP Upstream Pharm COPD Gold GraPNPYY:[id number]}urrent COPD Classification:  {CHL HP Upstream Pharm COPD Classification:[phone]} -MMRC/CAT score: *** -Pulmonary function testing: *** -Exacerbations requiring treatment in last 6 months: *** -Patient {Actions; denies-reports:120008} consistent use of maintenance inhaler -Frequency of rescue inhaler use:  *** -Counseled on {CCMINHALERCOUNSELING:25121} -{CCMPHARMDINTERVENTION:25122}  Insomnia (Goal: ***) -{US controlled/uncontrolled:25276} -Current treatment  Lunesta 3mg12mily -Medications previously tried: ***  -{CCMPHARMDINTERVENTION:25122}  Hypothyroidism (Goal: ***) -{US controlled/uncontrolled:25276} -Current treatment  Levothyroxine 88mc730medications previously tried: ***  -{CCMPHARMDINTERVENTION:25122}   Osteoporosis / Osteopenia Screening -Unknown -Last DEXA Scan: 2013  -Patient {is;is not an osteoporosis candidate:23886} -Current treatment  *** -Medications previously tried: ***  -{Osteoporosis Counseling:23892} DEXA scan already ordered -{CCMPHARMDINTERVENTION:25122}  Patient Goals/Self-Care Activities Patient will:  - {pharmacypatientgoals:24919}  Follow Up Plan: {CM FOLLOW UP PLAN:VAPO:14103}

## 2021-08-19 NOTE — Telephone Encounter (Signed)
Pt called to request refill on Clonazepam.  Per chart this is not a current medication for this pt.  Last rx was d/c'd  Discontinued by: Barb Merino, MD on 05/16/2021 16:58  Reason: Stop Taking at Discharge         Please advise, thanks!

## 2021-08-19 NOTE — Progress Notes (Signed)
°  Chronic Care Management   Note  08/19/2021 Name: Alexa Orozco MRN: [id number] DOB: [birth year]  Alexa Orozco is a 75 y.o. year old female who is a primary care patient of Dennard Schaumann, Cammie Mcgee, MD. I reached out to [name] by phone today in response to a referral sent by Ms. Lynne Logan PCP, Susy Frizzle, MD.   Alexa Orozco was given information about Chronic Care Management services today including:  CCM service includes personalized support from designated clinical staff supervised by her physician, including individualized plan of care and coordination with other care providers 24/7 contact phone numbers for assistance for urgent and routine care needs. Service will only be billed when office clinical staff spend 20 minutes or more in a month to coordinate care. Only one practitioner may furnish and bill the service in a calendar month. The patient may stop CCM services at any time (effective at the end of the month) by phone call to the office staff.   Patient agreed to services and verbal consent obtained.   Follow up plan: PT AWARE DEDUCTIBLE   [place]

## 2021-08-27 ENCOUNTER — Telehealth: Payer: Self-pay | Admitting: Pharmacist

## 2021-08-27 NOTE — Chronic Care Management (AMB) (Signed)
Chronic Care Management Pharmacy Assistant   Name: Alexa Orozco  MRN: [id number] DOB: [birth year]   Reason for Encounter: Chart Review For Initial Visit With Clinical Pharmacist   Conditions to be addressed/monitored: HTN, Raynaud's disease, CAD, COPD, Hypothyroid, Osteoprosis, OA, Bladder Cancer, HLD, Anxiety, Insomnia  Primary concerns for visit include: HTN, HLD, Hypothyroidism, Osteoporosis  Recent office visits:  07/08/2021 OV (PCP) Susy Frizzle, MD; Bactrim twice daily for 5 days, Azo for symptom relief. Topical Voltaren gel 4 times daily for right thumb pain.  05/22/2021 OV (PCP) Susy Frizzle, MD;  would like to maximize therapy for COPD in an effort to try to reduce any cardiac strain.  Therefore I will place her on Trelegy 1 inhalation a day.  Of asked her to discontinue Spiriva.  She is not taking enalapril.  She had to discontinue hydralazine due to an allergic reaction.  However her blood pressure here today is excellent.  Therefore I do not feel that she needs to replace this with any other blood pressure medication at the present time.  05/09/2021 OV (PCP) Susy Frizzle, MD;  Begin a prednisone taper pack coupled with albuterol 2 puffs every 4-6 hours as needed.  The question is whether this is bacterial induced versus COVID-pneumonia.  My COVID test will not come back until Monday.  The patient has a friend who has a home COVID test.  Therefore of asked her to go home and take the home COVID test.  If positive I will call out paxlovid in addition to the prednisone.  If negative, I want the patient to take a Z-Pak in addition to her prednisone  Recent consult visits:  08/18/2021 OV (Orthopedics) Sheran Luz, MD; no further information available.  Hospital visits:  05/12/2021 ED to Hospital Admission at [facility] for NSTEMI  Admit date: 05/12/2021 Discharge date: 05/16/2021  Stop taking these medications: Clonazepam 0.5 mg  tablet Tizanidine 4 mg tablet Trospium Chloride 60 mg  Started on enalapril and hydralazine for hypertension  Medications: Outpatient Encounter Medications as of 08/27/2021  Medication Sig   acetaminophen (TYLENOL) 500 MG tablet Take 500-1,000 mg by mouth every 6 (six) hours as needed (pain).   albuterol (PROVENTIL) (2.5 MG/3ML) 0.083% nebulizer solution INHALE THE CONTENTS OF 1 VIAL VIA NEBULIZER EVERY 6 HOURS AS NEEDED FOR WHEEZING OR SHORTNESS OF BREATH   albuterol (VENTOLIN HFA) 108 (90 Base) MCG/ACT inhaler Inhale 2 puffs into the lungs every 6 (six) hours as needed for wheezing or shortness of breath. Please dispense as Proair   aspirin EC 81 MG EC tablet Take 1 tablet (81 mg total) by mouth daily. Swallow whole.   clonazePAM (KLONOPIN) 0.5 MG tablet Take 1 tablet (0.5 mg total) by mouth 2 (two) times daily as needed for anxiety.   clopidogrel (PLAVIX) 75 MG tablet Take 1 tablet (75 mg total) by mouth daily.   cromolyn (OPTICROM) 4 % ophthalmic solution Place 1 drop into both eyes 4 (four) times daily.   diclofenac Sodium (VOLTAREN) 1 % GEL Apply 2 g topically 4 (four) times daily.   Eszopiclone 3 MG TABS TAKE 1 TABLET BY MOUTH IMMEDIATELY BEFORE BEDTIME   Fluticasone-Umeclidin-Vilant (TRELEGY ELLIPTA) 100-62.5-25 MCG/ACT AEPB Inhale 1 puff into the lungs daily.   hydrALAZINE (APRESOLINE) 50 MG tablet Take 1 tablet (50 mg total) by mouth every 6 (six) hours.   levothyroxine (SYNTHROID) 88 MCG tablet Take 1 tablet (88 mcg total) by mouth daily before breakfast.   meclizine (  ANTIVERT) 25 MG tablet Take 0.5 tablets (12.5 mg total) by mouth 3 (three) times daily as needed for dizziness.   montelukast (SINGULAIR) 10 MG tablet TAKE 1 TABLET BY MOUTH EVERYDAY AT BEDTIME   nitroGLYCERIN (NITROSTAT) 0.4 MG SL tablet Place 1 tablet (0.4 mg total) under the tongue every 5 (five) minutes as needed for chest pain.   pantoprazole (PROTONIX) 40 MG tablet Take 1 tablet (40 mg total) by mouth every  morning.   pravastatin (PRAVACHOL) 20 MG tablet Take 1 tablet (20 mg total) by mouth every morning.   sertraline (ZOLOFT) 100 MG tablet Take 1.5 tablets (150 mg total) by mouth daily.   sulfamethoxazole-trimethoprim (BACTRIM DS) 800-160 MG tablet Take 1 tablet by mouth 2 (two) times daily.   Tiotropium Bromide Monohydrate (SPIRIVA RESPIMAT) 2.5 MCG/ACT AERS Inhale into the lungs.   Trospium Chloride 60 MG CP24 Take 1 capsule (60 mg total) by mouth daily at 2 PM.   Vibegron (GEMTESA) 75 MG TABS Take 75 mg by mouth daily.   No facility-administered encounter medications on file as of 08/27/2021.   Current Medications: Clonazepam 0.5 mg last filled 08/19/2021 15 DS Albuterol 0.083% nebulizer solution last filled 07/28/2021 21 DS Trellegy Ellipta 100-62.5-25 mg last filled 06/05/2021 30 DS Plavix 75 mg last filled 07/09/2021 90 DS Trospium Chloride 60 mg last filled 07/31/2021 90 DS Bactrim DS last filled 07/08/2021 5 DS Voltaren 1% gel last filled 07/08/2021 12 DS Meclizine 25 mg last filled 07/08/2021 10 DS Spiriva Respimat 2.5 mcg/act last filled 12/24/2020 30 DS Vibegron 75 mg - not taking per patient  Albuterol 108 mcg/act Eszopiclone 3 mg last filled 07/31/2021 30 DS Sertraline 100 mg last filled 06/19/2021 90 DS Levothyroxine 88 mcg 06/19/2021 90 DS Montelukast 10 mg last filled 06/19/2021 90 DS Pantoprazole 40 mg last filled 06/19/2021 90 DS Pravastatin 20 mg last filled 06/19/2021 90 DS Aspirin 81 mg Hydralazine 50 mg last filled 05/16/2021 30 DS Acetaminophen 500 mg Nitroglycerin 0.4 mg Cromolyn 4% ophthalmic solution last filled 10/17/2019 25 DS  Patient Questions: Any changes in your medications or health? Patient denies any recent changes in her medications or health.  Any side effects from any medications?  Patient denies having any side effects from any of her medications.  Do you have any symptoms or problems not managed by your medications? Patient denies any  symptoms or problems that are not currently managed by her medications.  Any concerns about your health right now? Patient denies having any concerns with her health at this time.  Has your provider asked that you check blood pressure, blood sugar, or follow special diet at home? Patient does not check blood pressure or blood sugars at home. She does not follow any special diet.  Do you get any type of exercise on a regular basis? Patient states at times she will walk short distances.  Can you think of a goal you would like to reach for your health? "To be cancer free and never come back."  Do you have any problems getting your medications? "Maybe Vibegron?" Patient states she can not remember the name of the medication, but there was a prescription given for her "bladder cancer" that was too expensive so she didn't get it filled.  Is there anything that you would like to discuss during the appointment?  Patient states she would like to discuss her medications.  Please bring medications and supplements to appointment.  Care Gaps: Medicare Annual Wellness: Completed 01/02/2021 Hemoglobin A1C: n/a Colonoscopy:  Overdue since 09/01/2015 Dexa Scan: Ordered on 01/02/2021 Mammogram: Ordered on 12/25/2020  Future Appointments  Date Time Provider [place]  08/29/2021 10:00 AM [facility] PHARMACIST [facility] None  10/09/2021  1:30 PM Billey Co, MD [facility] None   Star Rating Drugs: Pravastatin 20 mg last filled 06/19/2021 90 DS  April D Calhoun, [place] Pharmacist Assistant [phone]

## 2021-08-27 NOTE — Telephone Encounter (Signed)
Rx sent in by Dr. Dennard Schaumann

## 2021-08-29 ENCOUNTER — Telehealth: Payer: 59

## 2021-09-19 ENCOUNTER — Telehealth: Payer: Self-pay | Admitting: Family Medicine

## 2021-09-19 NOTE — Telephone Encounter (Signed)
Please advise of possible alternative. Thanks!

## 2021-09-19 NOTE — Telephone Encounter (Signed)
Patient dropped off copy of letter received from [facility] to notify patient Rx of ESZOPICLONE TAB 3MG  will be temporarily supplied to give patient enough for 30 days. Letter advises patient to request a different drug.  Copy of letter placed in hanging folder at nurse's station.  Pharmacy confirmed as:  [id number] - [place], [place] [address] AT [facility]  [place], [place] [address]  Phone:  [phone]  Fax:  [phone]  DEA #:  [id number]  Please advise at [phone].

## 2021-09-22 ENCOUNTER — Other Ambulatory Visit: Payer: Self-pay | Admitting: Family Medicine

## 2021-09-22 MED ORDER — ZOLPIDEM TARTRATE 10 MG PO TABS: 10.0000 mg | ORAL_TABLET | Freq: Every evening | ORAL | 1 refills | Status: DC | PRN

## 2021-09-22 NOTE — Telephone Encounter (Signed)
Spoke with pt and she states she would be willing to try Ambien.   Please send in rx. Thanks!

## 2021-10-07 ENCOUNTER — Other Ambulatory Visit: Payer: Self-pay

## 2021-10-07 ENCOUNTER — Other Ambulatory Visit: Payer: Self-pay | Admitting: Family Medicine

## 2021-10-07 MED ORDER — ESZOPICLONE 3 MG PO TABS: ORAL_TABLET | ORAL | 3 refills | Status: DC

## 2021-10-07 MED ORDER — ESZOPICLONE 3 MG PO TABS: 3.0000 mg | ORAL_TABLET | Freq: Every day | ORAL | 2 refills | Status: DC

## 2021-10-07 NOTE — Telephone Encounter (Signed)
Pt called to report she has received a letter from her insurance co and they have [place]. Pt would like refills sent to pharmacy. Rx for Ambien canceled at pharmacy.  Please review, thanks!

## 2021-10-09 ENCOUNTER — Ambulatory Visit (INDEPENDENT_AMBULATORY_CARE_PROVIDER_SITE_OTHER): Payer: Medicare HMO | Admitting: Urology

## 2021-10-09 ENCOUNTER — Encounter: Payer: Self-pay | Admitting: Urology

## 2021-10-09 ENCOUNTER — Other Ambulatory Visit: Payer: Self-pay

## 2021-10-09 VITALS — BP 138/82 | HR 59 | Ht 62.0 in | Wt 174.0 lb

## 2021-10-09 DIAGNOSIS — C679 Malignant neoplasm of bladder, unspecified: Secondary | ICD-10-CM | POA: Diagnosis not present

## 2021-10-09 LAB — URINALYSIS, COMPLETE
Bilirubin, UA: NEGATIVE
Glucose, UA: NEGATIVE
Ketones, UA: NEGATIVE
Leukocytes,UA: NEGATIVE
Nitrite, UA: NEGATIVE
Protein,UA: NEGATIVE
RBC, UA: NEGATIVE
Specific Gravity, UA: 1.02 (ref 1.005–1.030)
Urobilinogen, Ur: 0.2 mg/dL (ref 0.2–1.0)
pH, UA: 7 (ref 5.0–7.5)

## 2021-10-09 LAB — MICROSCOPIC EXAMINATION: Bacteria, UA: NONE SEEN

## 2021-10-09 MED ORDER — SULFAMETHOXAZOLE-TRIMETHOPRIM 800-160 MG PO TABS
1.0000 | ORAL_TABLET | Freq: Once | ORAL | Status: AC
  Administered 2021-10-09: 1 via ORAL

## 2021-10-09 NOTE — Telephone Encounter (Signed)
Erroneous encounter. Please disregard.

## 2021-10-09 NOTE — Progress Notes (Signed)
Bladder cancer surveillance note     UROLOGIC HISTORY Alexa Orozco is a 76 y.o. female who originally presented with gross hematuria and on CT was found to have a large 7+ cm bladder tumor at the right lateral wall, posterior wall, and anteriorly.   Initial Diagnosis of Bladder  Year: 10/2020 Pathology: High-grade T1 urothelial cell carcinoma   11/2020: Second look TURBT-pathology showing high-grade T1, muscle present and not involved     Treatments for Bladder Cancer Initial TURBT 10/2020 TURBT second look 11/2020 Induction BCG x6 completed July 2022 mBCG #1 05/2021   AUA Risk Category High   Cystoscopy Procedure Note:   After informed consent and discussion of the procedure and its risks, Arianne Klinge was positioned and prepped in the standard fashion. Cystoscopy was performed with the a flexible cystoscope. The urethra, bladder neck and entire bladder was visualized in a standard fashion.  Bladder mucosa grossly normal throughout, no tumor seen.  No abnormalities on retroflexion.  Extensive scar at right side of the bladder.  She continues to smoke, and we again discussed smoking cessation and high risk of recurrence  -Call with cytology results, if negative continue with maintenance BCG(due now).  We discussed BCG shortage -Continue trospium for OAB, this has improved her symptoms -We reviewed the AUA guidelines regarding ongoing maintenance BCG and surveillance cystoscopy every 3 to 4 months for the first 2 years  [name], MD 10/09/2021

## 2021-10-13 ENCOUNTER — Ambulatory Visit: Payer: Medicare Other | Admitting: Family Medicine

## 2021-10-13 LAB — CYTOLOGY - NON PAP

## 2021-10-17 ENCOUNTER — Telehealth: Payer: Self-pay

## 2021-10-17 NOTE — Telephone Encounter (Signed)
Pt returned your call.  

## 2021-10-17 NOTE — Telephone Encounter (Signed)
LMOM for pt to return call to get maint BCG appts.

## 2021-10-20 NOTE — Telephone Encounter (Signed)
Returned pt's call, LMOM for pt to return call to get scheduled for maint BCG.

## 2021-10-20 NOTE — Telephone Encounter (Signed)
Pt scheduled for split dose maintenance BCG with 3 month cysto following. Pt notified, voiced understanding.

## 2021-10-21 ENCOUNTER — Ambulatory Visit (INDEPENDENT_AMBULATORY_CARE_PROVIDER_SITE_OTHER): Payer: Medicare HMO | Admitting: Family Medicine

## 2021-10-21 ENCOUNTER — Other Ambulatory Visit: Payer: Self-pay

## 2021-10-21 DIAGNOSIS — C679 Malignant neoplasm of bladder, unspecified: Secondary | ICD-10-CM | POA: Diagnosis not present

## 2021-10-21 MED ORDER — BCG LIVE 50 MG IS SUSR
3.2400 mL | Freq: Once | INTRAVESICAL | Status: AC
  Administered 2021-10-21: 81 mg via INTRAVESICAL

## 2021-10-21 NOTE — Progress Notes (Signed)
BCG Bladder Instillation  BCG # 1 of 3  Due to Bladder Cancer patient is present today for a BCG treatment. Patient was cleaned and prepped in a sterile fashion with betadine. A 14FR catheter was inserted, urine return was noted 169ml, urine was yellow in color.  29ml of reconstituted BCG was instilled into the bladder. The catheter was then removed. Patient tolerated well, no complications were noted  Performed by: Elberta Leatherwood, [place]. Gaspar Cola, CMA  Follow up/ Additional notes: 1 week #2

## 2021-10-22 LAB — URINALYSIS, COMPLETE
Bilirubin, UA: NEGATIVE
Glucose, UA: NEGATIVE
Ketones, UA: NEGATIVE
Leukocytes,UA: NEGATIVE
Nitrite, UA: NEGATIVE
Protein,UA: NEGATIVE
RBC, UA: NEGATIVE
Specific Gravity, UA: 1.02 (ref 1.005–1.030)
Urobilinogen, Ur: 0.2 mg/dL (ref 0.2–1.0)
pH, UA: 7 (ref 5.0–7.5)

## 2021-10-22 LAB — MICROSCOPIC EXAMINATION

## 2021-10-27 ENCOUNTER — Telehealth: Payer: Self-pay

## 2021-10-27 NOTE — Telephone Encounter (Signed)
Pt's BCG maintenance partner is sick this week. Canceled BCG appt for Tuesday Feb 28th, added 1 treatment to Tuesday Mar 14th. Pt notified and confirmed appts.

## 2021-10-28 ENCOUNTER — Ambulatory Visit: Payer: Medicare HMO

## 2021-11-02 ENCOUNTER — Other Ambulatory Visit: Payer: Self-pay | Admitting: Family Medicine

## 2021-11-04 ENCOUNTER — Ambulatory Visit (INDEPENDENT_AMBULATORY_CARE_PROVIDER_SITE_OTHER): Payer: Medicare HMO | Admitting: *Deleted

## 2021-11-04 ENCOUNTER — Other Ambulatory Visit: Payer: Self-pay

## 2021-11-04 DIAGNOSIS — N3281 Overactive bladder: Secondary | ICD-10-CM

## 2021-11-04 DIAGNOSIS — C679 Malignant neoplasm of bladder, unspecified: Secondary | ICD-10-CM | POA: Diagnosis not present

## 2021-11-04 MED ORDER — BCG LIVE 50 MG IS SUSR
3.2400 mL | Freq: Once | INTRAVESICAL | Status: AC
  Administered 2021-11-04: 81 mg via INTRAVESICAL

## 2021-11-04 NOTE — Progress Notes (Signed)
BCG Bladder Instillation ?  ?BCG # 12of 3 ?  ?Due to Bladder Cancer patient is present today for a BCG treatment. Patient was cleaned and prepped in a sterile fashion with betadine. A 14FR catheter was inserted, urine return was noted 73m, urine was yellow in color.  1067mof reconstituted BCG was instilled into the bladder. The catheter was then removed. Patient tolerated well, no complications were noted ?  ?Performed by: CaElberta LeatherwoodCMStratmoorJeZenaida NieceCMA ?  ?Follow up/ Additional notes: week #3 ?  ?

## 2021-11-05 LAB — URINALYSIS, COMPLETE
Bilirubin, UA: NEGATIVE
Glucose, UA: NEGATIVE
Ketones, UA: NEGATIVE
Leukocytes,UA: NEGATIVE
Nitrite, UA: NEGATIVE
Protein,UA: NEGATIVE
RBC, UA: NEGATIVE
Specific Gravity, UA: >1.03 — ABNORMAL HIGH (ref 1.005–1.030)
Urobilinogen, Ur: 0.2 mg/dL (ref 0.2–1.0)
pH, UA: 6 (ref 5.0–7.5)

## 2021-11-05 LAB — MICROSCOPIC EXAMINATION: Bacteria, UA: NONE SEEN

## 2021-11-11 ENCOUNTER — Other Ambulatory Visit: Payer: Self-pay

## 2021-11-11 ENCOUNTER — Ambulatory Visit: Payer: Medicare HMO | Admitting: Family Medicine

## 2021-11-11 DIAGNOSIS — C679 Malignant neoplasm of bladder, unspecified: Secondary | ICD-10-CM

## 2021-11-11 DIAGNOSIS — R310 Gross hematuria: Secondary | ICD-10-CM

## 2021-11-11 NOTE — Progress Notes (Signed)
Patient's urine looked bad today so BCG had to be cancelled.  ?

## 2021-11-13 LAB — URINALYSIS, COMPLETE
Bilirubin, UA: NEGATIVE
Glucose, UA: NEGATIVE
Ketones, UA: NEGATIVE
Nitrite, UA: NEGATIVE
Protein,UA: NEGATIVE
Specific Gravity, UA: 1.025 (ref 1.005–1.030)
Urobilinogen, Ur: 0.2 mg/dL (ref 0.2–1.0)
pH, UA: 6 (ref 5.0–7.5)

## 2021-11-13 LAB — MICROSCOPIC EXAMINATION: WBC, UA: >30 /hpf — AB (ref 0–5)

## 2021-11-14 ENCOUNTER — Telehealth: Payer: Self-pay

## 2021-11-14 NOTE — Telephone Encounter (Signed)
Pt LM on triage line requesting urine cx results. ? ?Called pt back advised pt that urine cx has not finalized , will call her as soon as results are available. Pt voiced understanding.  ?

## 2021-11-15 LAB — CULTURE, URINE COMPREHENSIVE

## 2021-11-17 ENCOUNTER — Other Ambulatory Visit: Payer: Self-pay | Admitting: *Deleted

## 2021-11-17 MED ORDER — SULFAMETHOXAZOLE-TRIMETHOPRIM 800-160 MG PO TABS: 1.0000 | ORAL_TABLET | Freq: Two times a day (BID) | ORAL | 0 refills | Status: AC

## 2021-11-17 NOTE — Telephone Encounter (Signed)
Please close encounter. Thank you.

## 2021-11-18 ENCOUNTER — Ambulatory Visit: Payer: Medicare HMO

## 2021-11-20 ENCOUNTER — Other Ambulatory Visit: Payer: Self-pay

## 2021-11-20 ENCOUNTER — Ambulatory Visit (INDEPENDENT_AMBULATORY_CARE_PROVIDER_SITE_OTHER): Payer: Medicare HMO

## 2021-11-20 DIAGNOSIS — C679 Malignant neoplasm of bladder, unspecified: Secondary | ICD-10-CM | POA: Diagnosis not present

## 2021-11-20 LAB — URINALYSIS, COMPLETE
Bilirubin, UA: NEGATIVE
Glucose, UA: NEGATIVE
Ketones, UA: NEGATIVE
Nitrite, UA: NEGATIVE
Protein,UA: NEGATIVE
Specific Gravity, UA: >1.03 — ABNORMAL HIGH (ref 1.005–1.030)
Urobilinogen, Ur: 0.2 mg/dL (ref 0.2–1.0)
pH, UA: 5.5 (ref 5.0–7.5)

## 2021-11-20 LAB — MICROSCOPIC EXAMINATION: Bacteria, UA: NONE SEEN

## 2021-11-20 MED ORDER — BCG LIVE 50 MG IS SUSR
1.6200 mL | Freq: Once | INTRAVESICAL | Status: AC
  Administered 2021-11-20: 40.5 mg via INTRAVESICAL

## 2021-11-20 NOTE — Progress Notes (Signed)
BCG Bladder Instillation ? ?mBCG # 3 ? ?Due to Bladder Cancer patient is present today for a BCG treatment. Patient was cleaned and prepped in a sterile fashion with betadine. A 14FR catheter was inserted, urine return was noted 66m, urine was yellow in color.  535mof reconstituted BCG was instilled into the bladder. The catheter was then removed. Patient tolerated well, no complications were noted ? ?Performed by: CrBradly BienenstockMA & ReDaryel GeraldMA ? ?Follow up/ Additional notes: RTC in June for scheduled follow up cysto. ?  ?

## 2021-11-21 ENCOUNTER — Encounter: Payer: Self-pay | Admitting: Family Medicine

## 2021-11-21 ENCOUNTER — Ambulatory Visit (INDEPENDENT_AMBULATORY_CARE_PROVIDER_SITE_OTHER): Payer: Medicare HMO | Admitting: Family Medicine

## 2021-11-21 VITALS — BP 122/72 | HR 62 | Temp 97.2°F | Ht 62.0 in | Wt 178.4 lb

## 2021-11-21 DIAGNOSIS — R5383 Other fatigue: Secondary | ICD-10-CM

## 2021-11-21 NOTE — Progress Notes (Signed)
? ?Subjective:  ? ? Patient ID: Alexa Orozco, female    DOB: Dec 13, 1945, 76 y.o.   MRN: [id number] ? ?HPI ?Patient is a very sweet 76 year old Caucasian female who presents today complaining of fatigue, weight gain, hair loss, dry skin.  Past medical history significant for bladder cancer.  She has been treated with intravesicular chemotherapy through urology.  She also has a history of coronary artery disease.  The last stress test that I can find in her chart was from 2014.  Review of systems today other than symptoms mentioned above is negative.  She denies any fevers or chills.  She denies any chest pain or cough or shortness of breath.  She denies any melena or hematochezia.  Daughter and family members have a history of hypothyroidism.  They would like to have her thyroid checked today.  Her most recent TSH was a year ago.  At that time it was within normal range. ?Past Medical History:  ?Diagnosis Date  ? Allergies   ? Allergy   ? Anxiety   ? Asthma   ? Bladder cancer ([place])   ? CAD S/P percutaneous coronary angioplasty 10/2005  ? Class III Angina --> Cath: 85% -- PCI 3.69m x 174m(4.0 mm) Cypher DES to RCA; Myoview October 2014: normal stress test: LOW RISK; mild anteroapical breast attenuation  ? Cataract   ? h/o bilat repair  ? Claud[place]  ? COPD (chronic obstructive pulmonary disease) ([place]  ? Depression 11/29/2013  ? Emphysema of lung ([facility]  ? GERD (gastroesophageal reflux disease)   ? History of palpitations   ? History of tobacco abuse   ? Hyperlipidemia   ? Hypertension   ? well-controlled  ? Hypothyroidism   ? Osteoporosis   ? Raynaud disease   ? ?Past Surgical History:  ?Procedure Laterality Date  ? ABDOMINAL HYSTERECTOMY    ? h/o partila hysterectomy  ? BREAST BIOPSY  1988  ? CORONARY ANGIOPLASTY WITH STENT PLACEMENT  10/14/2005  ? cypher DES (3.75m39m8mm) to high grade RCA lesion  ? NM MYOVIEW LTD  July 2012 of October 2014  ? '12: dipyridamole; Normal, low risk study; 2014 - normal  stress test: LOW RISK; mild anteroapical breast attenuation  ? TONSILLECTOMY  1959  ? TRANSTHORACIC ECHOCARDIOGRAM  05/22/2009  ? EF=>55%, normal LV systolic function; normal RV systolic function; mild MR/TR  ? TRANSURETHRAL RESECTION OF BLADDER TUMOR N/A 11/08/2020  ? Procedure: TRANSURETHRAL RESECTION OF BLADDER TUMOR (TURBT);  Surgeon: SniBilley CoD;  Location: [facility] ORS;  Service: Urology;  Laterality: N/A;  ? TRANSURETHRAL RESECTION OF BLADDER TUMOR N/A 12/13/2020  ? Procedure: TRANSURETHRAL RESECTION OF BLADDER TUMOR (TURBT);  Surgeon: SniBilley CoD;  Location: [facility] ORS;  Service: Urology;  Laterality: N/A;  ? ?Current Outpatient Medications on File Prior to Visit  ?Medication Sig Dispense Refill  ? acetaminophen (TYLENOL) 500 MG tablet Take 500-1,000 mg by mouth every 6 (six) hours as needed (pain).    ? albuterol (PROVENTIL) (2.5 MG/3ML) 0.083% nebulizer solution INHALE THE CONTENTS OF 1 VIAL VIA NEBULIZER EVERY 6 HOURS AS NEEDED FOR WHEEZING OR SHORTNESS OF BREATH 150 mL 1  ? albuterol (VENTOLIN HFA) 108 (90 Base) MCG/ACT inhaler Inhale 2 puffs into the lungs every 6 (six) hours as needed for wheezing or shortness of breath. Please dispense as Proair 18 g 2  ? aspirin EC 81 MG EC tablet Take 1 tablet (81 mg total) by mouth daily. Swallow whole. 30 tablet 11  ?  clonazePAM (KLONOPIN) 0.5 MG tablet Take 1 tablet (0.5 mg total) by mouth 2 (two) times daily as needed for anxiety. 30 tablet 0  ? clopidogrel (PLAVIX) 75 MG tablet Take 1 tablet (75 mg total) by mouth daily. 90 tablet 3  ? cromolyn (OPTICROM) 4 % ophthalmic solution Place 1 drop into both eyes 4 (four) times daily.    ? diclofenac Sodium (VOLTAREN) 1 % GEL Apply 2 g topically 4 (four) times daily. 100 g 3  ? eszopiclone 3 MG TABS Take 1 tablet (3 mg total) by mouth at bedtime. Take immediately before bedtime 30 tablet 2  ? Fluticasone-Umeclidin-Vilant (TRELEGY ELLIPTA) 100-62.5-25 MCG/ACT AEPB Inhale 1 puff into the lungs daily. 1 each 11   ? hydrALAZINE (APRESOLINE) 50 MG tablet Take 1 tablet (50 mg total) by mouth every 6 (six) hours. 120 tablet 0  ? levothyroxine (SYNTHROID) 88 MCG tablet Take 1 tablet (88 mcg total) by mouth daily before breakfast. 90 tablet 3  ? meclizine (ANTIVERT) 25 MG tablet Take 0.5 tablets (12.5 mg total) by mouth 3 (three) times daily as needed for dizziness. 30 tablet 0  ? montelukast (SINGULAIR) 10 MG tablet TAKE 1 TABLET BY MOUTH EVERYDAY AT BEDTIME 90 tablet 1  ? nitroGLYCERIN (NITROSTAT) 0.4 MG SL tablet Place 1 tablet (0.4 mg total) under the tongue every 5 (five) minutes as needed for chest pain. 25 tablet 6  ? pantoprazole (PROTONIX) 40 MG tablet Take 1 tablet (40 mg total) by mouth every morning. 90 tablet 3  ? pravastatin (PRAVACHOL) 20 MG tablet Take 1 tablet (20 mg total) by mouth every morning. 90 tablet 3  ? sertraline (ZOLOFT) 100 MG tablet Take 1.5 tablets (150 mg total) by mouth daily. 135 tablet 3  ? sulfamethoxazole-trimethoprim (BACTRIM DS) 800-160 MG tablet Take 1 tablet by mouth every 12 (twelve) hours for 7 days. 14 tablet 0  ? Tiotropium Bromide Monohydrate (SPIRIVA RESPIMAT) 2.5 MCG/ACT AERS Inhale into the lungs.    ? Trospium Chloride 60 MG CP24 Take 1 capsule (60 mg total) by mouth daily at 2 PM. 90 capsule 3  ? Vibegron (GEMTESA) 75 MG TABS Take 75 mg by mouth daily. 14 tablet 0  ? ?No current facility-administered medications on file prior to visit.  ? ?Allergies  ?Allergen Reactions  ? Bystolic [Nebivolol Hcl] Hives  ? Cephalexin Hives  ? Effexor [Venlafaxine] Other (See Comments)  ?  Made her face hot and red  ? Levaquin [Levofloxacin In D5w] Other (See Comments)  ?  Caused face to turn red and patient became extremely hot   ? ?Social History  ? ?Socioeconomic History  ? Marital status: Widowed  ?  Spouse name: Not on file  ? Number of children: 3  ? Years of education: 10th   ? Highest education level: Not on file  ?Occupational History  ? Occupation: Retired  ?Tobacco Use  ? Smoking  status: Every Day  ?  Packs/day: 0.50  ?  Years: 35.00  ?  Pack years: 17.50  ?  Types: Cigarettes  ?  Passive exposure: Never  ? Smokeless tobacco: Never  ? Tobacco comments:  ?  11/11/18 still at 1/2ppd  ?Vaping Use  ? Vaping Use: Never used  ?Substance and Sexual Activity  ? Alcohol use: No  ? Drug use: No  ? Sexual activity: Not on file  ?Other Topics Concern  ? Not on file  ?Social History Narrative  ? She is a widowed mother of 60, grandmother for, great-grandmother  1.  She still smokes about half pack a day.  She doesn't have about 35 years.  She does not drink.  ? Prior to her symptoms coming on, she used to do all kind of walking around and working in the garden and other activities with her close friend.  ? ?Social Determinants of Health  ? ?Financial Resource Strain: Low Risk   ? Difficulty of Paying Living Expenses: Not hard at all  ?Food Insecurity: No Food Insecurity  ? Worried About Charity fundraiser in the Last Year: Never true  ? Ran Out of Food in the Last Year: Never true  ?Transportation Needs: No Transportation Needs  ? Lack of Transportation (Medical): No  ? Lack of Transportation (Non-Medical): No  ?Physical Activity: Inactive  ? Days of Exercise per Week: 0 days  ? Minutes of Exercise per Session: 0 min  ?Stress: No Stress Concern Present  ? Feeling of Stress : Not at all  ?Social Connections: Moderately Isolated  ? Frequency of Communication with Friends and Family: More than three times a week  ? Frequency of Social Gatherings with Friends and Family: More than three times a week  ? Attends Religious Services: More than 4 times per year  ? Active Member of Clubs or Organizations: No  ? Attends Archivist Meetings: Never  ? Marital Status: Widowed  ?Intimate Partner Violence: Not At Risk  ? Fear of Current or Ex-Partner: No  ? Emotionally Abused: No  ? Physically Abused: No  ? Sexually Abused: No  ? ? ? ? ?Review of Systems  ?All other systems reviewed and are negative. ? ?    ?Objective:  ? Physical Exam ?Vitals reviewed.  ?Constitutional:   ?   General: She is not in acute distress. ?   Appearance: Normal appearance. She is normal weight. She is not ill-appearing or toxic-appearing.

## 2021-11-22 LAB — CBC WITH DIFFERENTIAL/PLATELET
Absolute Monocytes: 792 cells/uL (ref 200–950)
Basophils Absolute: 64 cells/uL (ref 0–200)
Basophils Relative: 0.8 %
Eosinophils Absolute: 264 cells/uL (ref 15–500)
Eosinophils Relative: 3.3 %
HCT: 38.7 % (ref 35.0–45.0)
Hemoglobin: 13 g/dL (ref 11.7–15.5)
Lymphs Abs: 2856 cells/uL (ref 850–3900)
MCH: 31.7 pg (ref 27.0–33.0)
MCHC: 33.6 g/dL (ref 32.0–36.0)
MCV: 94.4 fL (ref 80.0–100.0)
MPV: 10.3 fL (ref 7.5–12.5)
Monocytes Relative: 9.9 %
Neutro Abs: 4024 cells/uL (ref 1500–7800)
Neutrophils Relative %: 50.3 %
Platelets: 328 10*3/uL (ref 140–400)
RBC: 4.1 10*6/uL (ref 3.80–5.10)
RDW: 13.2 % (ref 11.0–15.0)
Total Lymphocyte: 35.7 %
WBC: 8 10*3/uL (ref 3.8–10.8)

## 2021-11-22 LAB — CORTISOL: Cortisol, Plasma: 4.6 ug/dL

## 2021-11-22 LAB — COMPLETE METABOLIC PANEL WITH GFR
AG Ratio: 1.6 (calc) (ref 1.0–2.5)
ALT: 20 U/L (ref 6–29)
AST: 24 U/L (ref 10–35)
Albumin: 4.1 g/dL (ref 3.6–5.1)
Alkaline phosphatase (APISO): 96 U/L (ref 37–153)
BUN/Creatinine Ratio: 11 (calc) (ref 6–22)
BUN: 15 mg/dL (ref 7–25)
CO2: 24 mmol/L (ref 20–32)
Calcium: 9.2 mg/dL (ref 8.6–10.4)
Chloride: 105 mmol/L (ref 98–110)
Creat: 1.31 mg/dL — ABNORMAL HIGH (ref 0.60–1.00)
Globulin: 2.6 g/dL (calc) (ref 1.9–3.7)
Glucose, Bld: 102 mg/dL — ABNORMAL HIGH (ref 65–99)
Potassium: 4.5 mmol/L (ref 3.5–5.3)
Sodium: 138 mmol/L (ref 135–146)
Total Bilirubin: 0.3 mg/dL (ref 0.2–1.2)
Total Protein: 6.7 g/dL (ref 6.1–8.1)
eGFR: 43 mL/min/{1.73_m2} — ABNORMAL LOW (ref >=60)

## 2021-11-22 LAB — TSH: TSH: 5.88 mIU/L — ABNORMAL HIGH (ref 0.40–4.50)

## 2021-11-22 LAB — T3, FREE: T3, Free: 2.2 pg/mL — ABNORMAL LOW (ref 2.3–4.2)

## 2021-11-22 LAB — T4, FREE: Free T4: 1 ng/dL (ref 0.8–1.8)

## 2021-11-28 ENCOUNTER — Other Ambulatory Visit: Payer: Self-pay

## 2021-11-28 MED ORDER — LEVOTHYROXINE SODIUM 100 MCG PO CAPS: 1.0000 | ORAL_CAPSULE | Freq: Every day | ORAL | 1 refills | Status: DC

## 2021-12-15 ENCOUNTER — Other Ambulatory Visit: Payer: Self-pay | Admitting: Family Medicine

## 2021-12-15 DIAGNOSIS — J302 Other seasonal allergic rhinitis: Secondary | ICD-10-CM

## 2021-12-15 DIAGNOSIS — J441 Chronic obstructive pulmonary disease with (acute) exacerbation: Secondary | ICD-10-CM

## 2021-12-16 ENCOUNTER — Other Ambulatory Visit: Payer: Self-pay

## 2022-01-05 ENCOUNTER — Other Ambulatory Visit: Payer: Self-pay | Admitting: Family Medicine

## 2022-01-06 NOTE — Telephone Encounter (Signed)
Requested Prescriptions  ?Pending Prescriptions Disp Refills  ?? levothyroxine (SYNTHROID) 100 MCG tablet [Pharmacy Med Name: LEVOTHYROXINE 100 MCG TABLET] 90 tablet 0  ?  Sig: TAKE 1 TABLET (100 MCG TOTAL) BY MOUTH DAILY BEFORE BREAKFAST.  ?  ? Endocrinology:  Hypothyroid Agents Failed - 01/05/2022  9:41 AM  ?  ?  Failed - TSH in normal range and within 360 days  ?  TSH  ?Date Value Ref Range Status  ?11/21/2021 5.88 (H) 0.40 - 4.50 mIU/L Final  ?   ?  ?  Passed - Valid encounter within last 12 months  ?  Recent Outpatient Visits   ?      ? 1 month ago Fatigue, unspecified type  ? [facility] [place], Cammie Mcgee, MD  ? 6 months ago Dysuria  ? [facility] [place], Cammie Mcgee, MD  ? 7 months ago Pneumonia due to COVID-19 virus  ? [facility] [place], Cammie Mcgee, MD  ? 8 months ago COPD exacerbation [facility])  ? [facility] [place], Cammie Mcgee, MD  ? 10 months ago COPD exacerbation [facility])  ? [place] Eulogio Bear, NP  ?  ?  ? ?  ?  ?  ? ? ?

## 2022-01-08 NOTE — Progress Notes (Signed)
Chronic Care Management Pharmacy Note  01/15/2022 Name:  Alexa Orozco MRN:  [id number] DOB:  14-Dec-1945  Summary: Initial visit with PharmD.  She was previously on Trelegy and it worked but had to stop due to cost.  Working to get TSH to normal level - reminded her she needs new labs to see if she is on correct dose.  Using albuterol multiple times per day -- she has SOB performing ADLs.  Recommendations/Changes made from today's visit: She was approved for [facility] - this could replace Spiriva and she can get at no cost. Make TSH lab appt.  Plan: FU 3 months   Subjective: Alexa Orozco is an 76 y.o. year old female who is a primary patient of Pickard, Cammie Mcgee, MD.  The CCM team was consulted for assistance with disease management and care coordination needs.    Engaged with patient by telephone for initial visit in response to provider referral for pharmacy case management and/or care coordination services.   Consent to Services:  The patient was given the following information about Chronic Care Management services today, agreed to services, and gave verbal consent: 1. CCM service includes personalized support from designated clinical staff supervised by the primary care provider, including individualized plan of care and coordination with other care providers 2. 24/7 contact phone numbers for assistance for urgent and routine care needs. 3. Service will only be billed when office clinical staff spend 20 minutes or more in a month to coordinate care. 4. Only one practitioner may furnish and bill the service in a calendar month. 5.The patient may stop CCM services at any time (effective at the end of the month) by phone call to the office staff. 6. The patient will be responsible for cost sharing (co-pay) of up to 20% of the service fee (after annual deductible is met). Patient agreed to services and consent obtained.  Patient Care Team: Susy Frizzle, MD as PCP - General (Family  Medicine) Leonie Man, MD as PCP - Cardiology (Cardiology) Edythe Clarity, [facility] (Pharmacist)  Recent office visits: 11/21/2021 Alexa Orozco) - patient is having extreme fatigue patient wants treated between 1-2.  Objective:  Lab Results  Component Value Date   CREATININE 1.31 (H) 11/21/2021   BUN 15 11/21/2021   EGFR 43 (L) 11/21/2021   GFRNONAA >60 05/14/2021   GFRAA 67 02/06/2021   NA 138 11/21/2021   K 4.5 11/21/2021   CALCIUM 9.2 11/21/2021   CO2 24 11/21/2021   GLUCOSE 102 (H) 11/21/2021    No results found for: HGBA1C, FRUCTOSAMINE, GFR, MICROALBUR  Last diabetic Eye exam: No results found for: HMDIABEYEEXA  Last diabetic Foot exam: No results found for: HMDIABFOOTEX   Lab Results  Component Value Date   CHOL 204 (H) 05/13/2021   HDL 75 05/13/2021   LDLCALC 110 (H) 05/13/2021   TRIG 97 05/13/2021   CHOLHDL 2.7 05/13/2021       Latest Ref Rng & Units 11/21/2021    4:06 PM 05/22/2021   12:45 PM 05/15/2021    1:28 AM  Hepatic Function  Total Protein 6.1 - 8.1 g/dL 6.7   6.7   6.3    Albumin 3.5 - 5.0 g/dL   3.0    AST 10 - 35 U/L 24   21   34    ALT 6 - 29 U/L 20   34   49    Alk Phosphatase 38 - 126 U/L   92    Total Bilirubin  0.2 - 1.2 mg/dL 0.3   0.4   0.6    Bilirubin, Direct 0.0 - 0.2 mg/dL   <0.1      Lab Results  Component Value Date/Time   TSH 5.88 (H) 11/21/2021 04:06 PM   TSH 3.82 02/06/2021 03:33 PM   FREET4 1.0 11/21/2021 04:06 PM       Latest Ref Rng & Units 11/21/2021    4:06 PM 05/27/2021   11:21 AM 05/22/2021   12:45 PM  CBC  WBC 3.8 - 10.8 Thousand/uL 8.0   13.5   14.1    Hemoglobin 11.7 - 15.5 g/dL 13.0   11.8   11.7    Hematocrit 35.0 - 45.0 % 38.7   35.4   35.2    Platelets 140 - 400 Thousand/uL 328   300   344      Lab Results  Component Value Date/Time   VD25OH 19 (L) 02/08/2019 09:09 AM    Clinical ASCVD: Yes  The ASCVD Risk score (Arnett DK, et al., 2019) failed to calculate for the following reasons:   The patient  has a prior MI or stroke diagnosis       01/02/2021   12:15 PM 10/03/2020   12:26 PM 03/09/2018    2:33 PM  Depression screen PHQ 2/9  Decreased Interest 1 0 3  Down, Depressed, Hopeless 1 0 3  PHQ - 2 Score 2 0 6  Altered sleeping 3  3  Tired, decreased energy 3  3  Change in appetite 0  0  Feeling bad or failure about yourself  1  0  Trouble concentrating 0  3  Moving slowly or fidgety/restless 0  3  Suicidal thoughts 0  0  PHQ-9 Score 9  18  Difficult doing work/chores Not difficult at all  Not difficult at all      Social History   Tobacco Use  Smoking Status Every Day   Packs/day: 0.50   Years: 35.00   Pack years: 17.50   Types: Cigarettes   Passive exposure: Never  Smokeless Tobacco Never  Tobacco Comments   11/11/18 still at 1/2ppd   BP Readings from Last 3 Encounters:  11/21/21 122/72  10/09/21 138/82  07/08/21 134/88   Pulse Readings from Last 3 Encounters:  11/21/21 62  10/09/21 (!) 59  07/08/21 60   Wt Readings from Last 3 Encounters:  11/21/21 178 lb 6.4 oz (80.9 kg)  10/09/21 174 lb (78.9 kg)  07/08/21 170 lb (77.1 kg)   BMI Readings from Last 3 Encounters:  11/21/21 32.63 kg/m  10/09/21 31.83 kg/m  07/08/21 31.09 kg/m    Assessment/Interventions: Review of patient past medical history, allergies, medications, health status, including review of consultants reports, laboratory and other test data, was performed as part of comprehensive evaluation and provision of chronic care management services.   SDOH:  (Social Determinants of Health) assessments and interventions performed: Yes  Financial Resource Strain: Low Risk    Difficulty of Paying Living Expenses: Not very hard   Food Insecurity: No Food Insecurity   Worried About Charity fundraiser in the Last Year: Never true   Ran Out of Food in the Last Year: Never true    SDOH Screenings   Alcohol Screen: Not on file  Depression (TLX7-2): Not on file  Financial Resource Strain: Low Risk     Difficulty of Paying Living Expenses: Not very hard  Food Insecurity: No Food Insecurity   Worried About Charity fundraiser  in the Last Year: Never true   Ran Out of Food in the Last Year: Never true  Housing: Not on file  Physical Activity: Not on file  Social Connections: Not on file  Stress: Not on file  Tobacco Use: High Risk   Smoking Tobacco Use: Every Day   Smokeless Tobacco Use: Never   Passive Exposure: Never  Transportation Needs: Not on file    [place]  Allergies  Allergen Reactions   Bystolic [Nebivolol Hcl] Hives   Cephalexin Hives   Effexor [Venlafaxine] Other (See Comments)    Made her face hot and red   Levaquin [Levofloxacin In D5w] Other (See Comments)    Caused face to turn red and patient became extremely hot     Medications Reviewed Today     Reviewed by Edythe Clarity, [facility] (Pharmacist) on 01/15/22 at 1132  Med List Status: <None>   Medication Order Taking? Sig Documenting Provider Last Dose Status Informant  acetaminophen (TYLENOL) 500 MG tablet [id number] Yes Take 500-1,000 mg by mouth every 6 (six) hours as needed (pain). [provider] Taking Active Self  albuterol (PROVENTIL) (2.5 MG/3ML) 0.083% nebulizer solution [id number] Yes INHALE THE CONTENTS OF 1 VIAL (3ML) VIA NEBULIZER EVERY 6 HOURS AS NEEDED FOR WHEEZING OR SHORTNESS OF Montel Clock, MD Taking Active   albuterol (VENTOLIN HFA) 108 (90 Base) MCG/ACT inhaler [id number] Yes USE 2 INHALATIONS ORALLY   EVERY 6 HOURS AS NEEDED FORWHEEZING OR SHORTNESS OF   Montel Clock, MD Taking Active   aspirin EC 81 MG EC tablet [id number] Yes Take 1 tablet (81 mg total) by mouth daily. Swallow whole. Barb Merino, MD Taking Active   clonazePAM Bobbye [place]) 0.5 MG tablet [id number] Yes Take 1 tablet (0.5 mg total) by mouth 2 (two) times daily as needed for anxiety. Susy Frizzle, MD Taking Active   clopidogrel (PLAVIX) 75 MG tablet [id number] Yes Take 1 tablet (75 mg  total) by mouth daily. Susy Frizzle, MD Taking Active   cromolyn (OPTICROM) 4 % ophthalmic solution [id number]  Place 1 drop into both eyes 4 (four) times daily. [provider]  Active Self           Med Note Tamala Julian, JEFFREY W   Mon Jun 22, 2016  4:56 AM)    diclofenac Sodium (VOLTAREN) 1 % GEL [id number]  Apply 2 g topically 4 (four) times daily. Susy Frizzle, MD  Active   eszopiclone 3 MG TABS [id number] Yes Take 1 tablet (3 mg total) by mouth at bedtime. Take immediately before bedtime Susy Frizzle, MD Taking Active   Fluticasone-Umeclidin-Vilant (TRELEGY ELLIPTA) 100-62.5-25 MCG/ACT AEPB [id number] No Inhale 1 puff into the lungs daily.  Patient not taking: Reported on 01/15/2022   Susy Frizzle, MD Not Taking Active   hydrALAZINE (APRESOLINE) 50 MG tablet [id number] No Take 1 tablet (50 mg total) by mouth every 6 (six) hours.  Patient not taking: Reported on 01/15/2022   Barb Merino, MD Not Taking Active   levothyroxine (SYNTHROID) 100 MCG tablet [id number] Yes TAKE 1 TABLET (100 MCG TOTAL) BY MOUTH DAILY BEFORE BREAKFAST. Susy Frizzle, MD Taking Active   meclizine (ANTIVERT) 25 MG tablet [id number] Yes Take 0.5 tablets (12.5 mg total) by mouth 3 (three) times daily as needed for dizziness. Susy Frizzle, MD Taking Active   montelukast (SINGULAIR) 10 MG tablet [id number] Yes TAKE 1 TABLET AT BEDTIME Susy Frizzle, MD Taking Active   nitroGLYCERIN (NITROSTAT)  0.4 MG SL tablet [id number] Yes Place 1 tablet (0.4 mg total) under the tongue every 5 (five) minutes as needed for chest pain. Orlena Sheldon, PA-C Taking Active Self  pantoprazole (PROTONIX) 40 MG tablet [id number] Yes Take 1 tablet (40 mg total) by mouth every morning. Susy Frizzle, MD Taking Active   pravastatin (PRAVACHOL) 20 MG tablet [id number] Yes Take 1 tablet (20 mg total) by mouth every morning. Susy Frizzle, MD Taking Active   sertraline (ZOLOFT) 100 MG tablet [id number] Yes Take 1.5  tablets (150 mg total) by mouth daily. Susy Frizzle, MD Taking Active   Tiotropium Bromide Monohydrate (SPIRIVA RESPIMAT) 2.5 MCG/ACT AERS [id number] Yes Inhale into the lungs. [provider] Taking Active   Trospium Chloride 60 MG CP24 [id number] Yes Take 1 capsule (60 mg total) by mouth daily at 2 PM. Vaillancourt, Samantha, PA-C Taking Active   Vibegron (GEMTESA) 75 MG TABS [id number] No Take 75 mg by mouth daily.  Patient not taking: Reported on 01/15/2022   Debroah Loop, PA-C Not Taking Active             Patient Active Problem List   Diagnosis Date Noted   Primary osteoarthritis of both first carpometacarpal joints 08/18/2021   Right hand pain 08/18/2021   NSTEMI (non-ST elevated myocardial infarction) ([place]) 05/12/2021   Bladder cancer ([place]) 05/12/2021   COVID-19 virus infection 05/12/2021   Acute on chronic respiratory failure with hypoxia ([place]) 05/12/2021   Insomnia 09/19/2014   Generalized anxiety disorder 09/19/2014   Depression 11/29/2013   Osteoporosis    Obesity (BMI 30-39.9) 05/31/2013   Stable angina ([place]) 05/31/2013   Claudication of left lower extremity ([place]) 05/31/2013   CAD S/P percutaneous coronary angioplasty    Dyslipidemia    Hirsutism 03/03/2011   Essential hypertension    Hypothyroid    Raynaud's disease    COPD (chronic obstructive pulmonary disease) ([place])    Tobacco abuse     Immunization History  Administered Date(s) Administered   Fluad Quad(high Dose 65+) 05/16/2021   Influenza, High Dose Seasonal PF 05/24/2014, 05/11/2019   Influenza,inj,Quad PF,6+ Mos 06/20/2015   Pneumococcal Conjugate-13 11/29/2013   Pneumococcal Polysaccharide-23 12/23/2011   Tdap 12/23/2011   Zoster, Live 01/17/2013    Conditions to be addressed/monitored:  HTN, CAD, COPD, Hypothyroidism, Dyslipidemia, Depression, Insomnia  Care Plan : General Pharmacy (Adult)  Updates made by Edythe Clarity, RPH since 01/15/2022 12:00 AM     Problem:  HTN, CAD, COPD, Hypothyroidism, Dyslipidemia, Depression, Insomnia   Priority: High  Onset Date: 01/15/2022     Long-Range Goal: Patient-Specific Goal   Start Date: 01/15/2022  Expected End Date: 07/18/2022  This Visit's Progress: On track  Priority: High  Note:   Current Barriers:  Suboptimal therapeutic regimen for COPD  Pharmacist Clinical Goal(s):  Patient will achieve improvement in breathing as evidenced by symptoms through collaboration with PharmD and provider.   Interventions: 1:1 collaboration with Susy Frizzle, MD regarding development and update of comprehensive plan of care as evidenced by provider attestation and co-signature Inter-disciplinary care team collaboration (see longitudinal plan of care) Comprehensive medication review performed; medication list updated in electronic medical record  Hypertension (BP goal <130/80) -Controlled -Current treatment: None -Medications previously tried: hydralazine  -Current home readings: unknown -Denies hypotensive/hypertensive symptoms -Educated on BP goals and benefits of medications for prevention of heart attack, stroke and kidney damage; Daily salt intake goal < 2300 mg; Importance of home blood pressure monitoring; Symptoms of hypotension and  importance of maintaining adequate hydration; -Counseled to monitor BP at home a few times per week, document, and provide log at future appointments -Recommended to continue current medication  Hyperlipidemia: (LDL goal < 70) -Uncontrolled -Current treatment: Pravastatin 62m daily Appropriate, Query effective, ,  -Medications previously tried: none noted  -Educated on Cholesterol goals;  Benefits of statin for ASCVD risk reduction; Importance of limiting foods high in cholesterol; -Recommended to continue current medication She needs to recheck her fasting lipid panel - if LDL still elevated at that time would recommend increasing statin intensity to high intensity  statin.  COPD (Goal: control symptoms and prevent exacerbations) -Uncontrolled -Current treatment  Albuterol HFA 913m Appropriate, Effective, Safe, Accessible Spiriva 2.45m78maer Appropriate, Query effective, ,  -Medications previously tried: Trelegy($)  -MMRC/CAT score:      View : No data to display.        -Pulmonary function testing: Pulmonary Functions Testing Results:  No results found for: FEV1, FVC, FEV1FVC, TLC, DLCO  -Exacerbations requiring treatment in last 6 months: none -Patient reports consistent use of maintenance inhaler -Frequency of rescue inhaler use: a few times per day -Counseled on Proper inhaler technique; Benefits of consistent maintenance inhaler use When to use rescue inhaler Differences between maintenance and rescue inhalers - She was using Trelegy and reports working well but she stopped due to cost.  Has since been on Spiriva.  Reports SOB when walking around the house performing ADLs. [place] today.  She will qualify for free Breztri through 08/30/22. -Recommended to continue current medication Will consult with PCP on getting [facility]  Depression/Anxiety (Goal: Rreduce symptoms) -Controlled -Current treatment: Sertraline 150m645mily Appropriate, Effective, Safe, Accessible Clonazepam 0.45mg 82mropriate, Effective, Safe, Accessible -Medications previously tried/failed: none noted -PHQ9:     01/02/2021   12:15 PM 10/03/2020   12:26 PM 03/09/2018    2:33 PM  PHQ9 SCORE ONLY  PHQ-9 Total Score 9 0 18   -GAD7:      View : No data to display.           -Educated on Benefits of medication for symptom control -Recommended to continue current medication She reports her living situation has changed and this has improved her mental state.  She does need a refill on clonazepam and uses those prn.   Thyroid (Goal TSH: 1-3 Lab Results   Component Value Date   TSH 5.88 (H) 11/21/2021  -Not ideally controlled -Current treatment: Levothyroxine 100mcg14mropriate, Effective, Safe, Accessible -Counseled to take medication on an empty stomach -Recommended to continue current medication It has been about 7 weeks since dose change.  Reminded her to call and schedule lab follow up.  She still feels tired all of the time.  Suspect some of this could be due to COPD and SOB. She takes medication at correct time.   Patient Goals/Self-Care Activities Patient will:  - take medications as prescribed as evidenced by patient report and record review focus on medication adherence by pill box collaborate with provider on medication access solutions  Follow Up Plan: The care management team will reach out to the patient again over the next 90 days.       Medication Assistance: Application for Breztri  medication assistance program. in process.  Anticipated assistance start date today.  See plan of care for additional detail.  Compliance/Adherence/Medication fill history: Care Gaps: Colonoscopy, mammogram  Star-Rating Drugs: Pravastatin 20mg49m  12/15/21 90ds  Patient's preferred pharmacy is:  [facility] [place] [facility] [address] [place] [address] Phone: [phone] Fax: [phone]  [id number] [address]: [phone]: [phone] Uses pill box? Yes Pt endorses 100% compliance  We discussed: Benefits of medication synchronization, packaging and delivery as well as enhanced pharmacist oversight with [employer]. Patient decided to: Continue current medication management strategy  Care Plan and Follow Up Patient Decision:  Patient agrees to Care Plan and Follow-up.  Plan: The care management team will reach out to the  patient again over the next 90 days.  CBeverly Milch PharmD, CPP Clinical Pharmacist Practitioner [facility]([phone]

## 2022-01-15 ENCOUNTER — Ambulatory Visit: Payer: Medicare HMO | Admitting: Pharmacist

## 2022-01-15 ENCOUNTER — Telehealth: Payer: Self-pay | Admitting: Pharmacist

## 2022-01-15 DIAGNOSIS — F339 Major depressive disorder, recurrent, unspecified: Secondary | ICD-10-CM

## 2022-01-15 DIAGNOSIS — E785 Hyperlipidemia, unspecified: Secondary | ICD-10-CM

## 2022-01-15 DIAGNOSIS — J439 Emphysema, unspecified: Secondary | ICD-10-CM

## 2022-01-15 DIAGNOSIS — E039 Hypothyroidism, unspecified: Secondary | ICD-10-CM

## 2022-01-15 DIAGNOSIS — J441 Chronic obstructive pulmonary disease with (acute) exacerbation: Secondary | ICD-10-CM

## 2022-01-15 DIAGNOSIS — I10 Essential (primary) hypertension: Secondary | ICD-10-CM

## 2022-01-15 MED ORDER — BREZTRI AEROSPHERE 160-9-4.8 MCG/ACT IN AERO: 2.0000 | INHALATION_SPRAY | Freq: Two times a day (BID) | RESPIRATORY_TRACT | 11 refills | Status: DC

## 2022-01-15 NOTE — Chronic Care Management (AMB) (Signed)
New rx for Breztri called in to [facility].  Patient aware to stop Spiriva once this arrives.

## 2022-01-15 NOTE — Telephone Encounter (Signed)
-----   Message from Susy Frizzle, MD sent at 01/15/2022  2:28 PM EDT ----- Sure that'd be great, 2 puffs twice a day.  ----- Message ----- From: Edythe Clarity, [facility] Sent: 01/15/2022  11:48 AM EDT To: Susy Frizzle, MD  Hey so she was having SOB all day - previously on Trelegy but had to stop due to cost.  I got her approved through the [facility] program. Would you be ok with sending that to replace Spiriva?  I will be happy to send for you if you are ok with it.  If you want to send  All they need is a signed rx faxed to [phone] or E-scribed to [facility] (NPI [id number]) or [facility] (NPI [id number]).  These pharmacies are located in [place], [place].   Gerald Stabs

## 2022-01-15 NOTE — Addendum Note (Signed)
Addended by: Edythe Clarity on: 01/15/2022 03:26 PM   Modules accepted: Orders

## 2022-01-15 NOTE — Patient Instructions (Addendum)
Visit Information   Goals Addressed             This Visit's Progress    Track and Manage My Symptoms-COPD       Timeframe:  Long-Range Goal Priority:  High Start Date:   01/15/22                          Expected End Date:  07/18/22                      Follow Up Date 04/17/22    Approved for triple therapy inhaler - Judithann Sauger   Why is this important?   Tracking your symptoms and other information about your health helps your doctor plan your care.  Write down the symptoms, the time of day, what you were doing and what medicine you are taking.  You will soon learn how to manage your symptoms.     Notes:        Patient Care Plan: General Pharmacy (Adult)     Problem Identified: HTN, CAD, COPD, Hypothyroidism, Dyslipidemia, Depression, Insomnia   Priority: High  Onset Date: 01/15/2022     Long-Range Goal: Patient-Specific Goal   Start Date: 01/15/2022  Expected End Date: 07/18/2022  This Visit's Progress: On track  Priority: High  Note:   Current Barriers:  Suboptimal therapeutic regimen for COPD  Pharmacist Clinical Goal(s):  Patient will achieve improvement in breathing as evidenced by symptoms through collaboration with PharmD and provider.   Interventions: 1:1 collaboration with Susy Frizzle, MD regarding development and update of comprehensive plan of care as evidenced by provider attestation and co-signature Inter-disciplinary care team collaboration (see longitudinal plan of care) Comprehensive medication review performed; medication list updated in electronic medical record  Hypertension (BP goal <130/80) -Controlled -Current treatment: None -Medications previously tried: hydralazine  -Current home readings: unknown -Denies hypotensive/hypertensive symptoms -Educated on BP goals and benefits of medications for prevention of heart attack, stroke and kidney damage; Daily salt intake goal < 2300 mg; Importance of home blood pressure  monitoring; Symptoms of hypotension and importance of maintaining adequate hydration; -Counseled to monitor BP at home a few times per week, document, and provide log at future appointments -Recommended to continue current medication  Hyperlipidemia: (LDL goal < 70) -Uncontrolled -Current treatment: Pravastatin '20mg'$  daily Appropriate, Query effective, ,  -Medications previously tried: none noted  -Educated on Cholesterol goals;  Benefits of statin for ASCVD risk reduction; Importance of limiting foods high in cholesterol; -Recommended to continue current medication She needs to recheck her fasting lipid panel - if LDL still elevated at that time would recommend increasing statin intensity to high intensity statin.  COPD (Goal: control symptoms and prevent exacerbations) -Uncontrolled -Current treatment  Albuterol HFA 32mg Appropriate, Effective, Safe, Accessible Spiriva 2.549m/aer Appropriate, Query effective, ,  -Medications previously tried: Trelegy($)  -MMRC/CAT score:      View : No data to display.        -Pulmonary function testing: Pulmonary Functions Testing Results:  No results found for: FEV1, FVC, FEV1FVC, TLC, DLCO  -Exacerbations requiring treatment in last 6 months: none -Patient reports consistent use of maintenance inhaler -Frequency of rescue inhaler use: a few times per day -Counseled on Proper inhaler technique; Benefits of consistent maintenance inhaler use When to use rescue inhaler Differences between maintenance and rescue inhalers - She was using Trelegy and reports working well but she stopped due to cost.  Has since been  on Spiriva.  Reports SOB when walking around the house performing ADLs. [place] and [place] today.  She will qualify for free Breztri through 08/30/22. -Recommended to continue current medication Will consult with PCP on getting new prescription sent in for [facility].  Would replace her  Spiriva.  Depression/Anxiety (Goal: Rreduce symptoms) -Controlled -Current treatment: Sertraline '150mg'$  daily Appropriate, Effective, Safe, Accessible Clonazepam 0.'5mg'$  Appropriate, Effective, Safe, Accessible -Medications previously tried/failed: none noted -PHQ9:     01/02/2021   12:15 PM 10/03/2020   12:26 PM 03/09/2018    2:33 PM  PHQ9 SCORE ONLY  PHQ-9 Total Score 9 0 18   -GAD7:      View : No data to display.           -Educated on Benefits of medication for symptom control -Recommended to continue current medication She reports her living situation has changed and this has improved her mental state.  She does need a refill on clonazepam and uses those prn.   Thyroid (Goal TSH: 1-3 Lab Results  Component Value Date   TSH 5.88 (H) 11/21/2021  -Not ideally controlled -Current treatment: Levothyroxine 148mg Appropriate, Effective, Safe, Accessible -Counseled to take medication on an empty stomach -Recommended to continue current medication It has been about 7 weeks since dose change.  Reminded her to call and schedule lab follow up.  She still feels tired all of the time.  Suspect some of this could be due to COPD and SOB. She takes medication at correct time.   Patient Goals/Self-Care Activities Patient will:  - take medications as prescribed as evidenced by patient report and record review focus on medication adherence by pill box collaborate with provider on medication access solutions  Follow Up Plan: The care management team will reach out to the patient again over the next 90 days.      Ms. WKimmeywas given information about Chronic Care Management services today including:  CCM service includes personalized support from designated clinical staff supervised by her physician, including individualized plan of care and coordination with other care providers 24/7 contact phone numbers for assistance for urgent and routine care needs. Standard insurance,  coinsurance, copays and deductibles apply for chronic care management only during months in which we provide at least 20 minutes of these services. Most insurances cover these services at 100%, however patients may be responsible for any copay, coinsurance and/or deductible if applicable. This service may help you avoid the need for more expensive face-to-face services. Only one practitioner may furnish and bill the service in a calendar month. The patient may stop CCM services at any time (effective at the end of the month) by phone call to the office staff.  Patient agreed to services and verbal consent obtained.   The patient verbalized understanding of instructions, educational materials, and care plan provided today and agreed to receive a mailed copy of patient instructions, educational materials, and care plan.  Telephone follow up appointment with pharmacy team member scheduled for: 3 months  CEdythe Clarity REllston PharmD, CLake ZurichClinical Pharmacist Practitioner [phone]

## 2022-01-22 ENCOUNTER — Other Ambulatory Visit: Payer: Medicare HMO

## 2022-01-22 DIAGNOSIS — E039 Hypothyroidism, unspecified: Secondary | ICD-10-CM

## 2022-01-23 ENCOUNTER — Ambulatory Visit (INDEPENDENT_AMBULATORY_CARE_PROVIDER_SITE_OTHER): Payer: Medicare HMO

## 2022-01-23 ENCOUNTER — Other Ambulatory Visit: Payer: Self-pay

## 2022-01-23 VITALS — Ht 62.0 in | Wt 178.0 lb

## 2022-01-23 DIAGNOSIS — Z0000 Encounter for general adult medical examination without abnormal findings: Secondary | ICD-10-CM | POA: Diagnosis not present

## 2022-01-23 DIAGNOSIS — Z720 Tobacco use: Secondary | ICD-10-CM

## 2022-01-23 DIAGNOSIS — J441 Chronic obstructive pulmonary disease with (acute) exacerbation: Secondary | ICD-10-CM | POA: Diagnosis not present

## 2022-01-23 DIAGNOSIS — Z1211 Encounter for screening for malignant neoplasm of colon: Secondary | ICD-10-CM | POA: Diagnosis not present

## 2022-01-23 DIAGNOSIS — Z1212 Encounter for screening for malignant neoplasm of rectum: Secondary | ICD-10-CM

## 2022-01-23 LAB — TSH: TSH: 0.45 mIU/L (ref 0.40–4.50)

## 2022-01-23 MED ORDER — LEVOTHYROXINE SODIUM 100 MCG PO TABS: ORAL_TABLET | ORAL | 3 refills | Status: DC

## 2022-01-23 NOTE — Patient Instructions (Signed)
Alexa Orozco , Thank you for taking time to come for your Medicare Wellness Visit. I appreciate your ongoing commitment to your health goals. Please review the following plan we discussed and let me know if I can assist you in the future.   Screening recommendations/referrals: Colonoscopy: Done 08/31/2005 Repeat in 10 years  Mammogram: Done 12/10/2017 Repeat annually  Bone Density: Done 02/15/2012 Repeat every 2 years  Recommended yearly ophthalmology/optometry visit for glaucoma screening and checkup Recommended yearly dental visit for hygiene and checkup  Vaccinations: Influenza vaccine: Done 05/16/2021 Repeat annually  Pneumococcal vaccine: Done 11/29/2013 and 12/23/2011 Tdap vaccine: Done 12/23/2011 Repeat in 10 years  Shingles vaccine: Done 01/17/2013. Shingrix available at local pharmacy.   Covid-19:Declined.   Advanced directives: Please bring a copy of your health care power of attorney and living will to the office to be added to your chart at your convenience.   Conditions/risks identified: Aim for 30 minutes of exercise or brisk walking, 6-8 glasses of water, and 5 servings of fruits and vegetables each day. KEEP UP THE GOOD WORK!  Next appointment: Follow up in one year for your annual wellness visit 2024.   Preventive Care 28 Years and Older, Female Preventive care refers to lifestyle choices and visits with your health care provider that can promote health and wellness. What does preventive care include? A yearly physical exam. This is also called an annual well check. Dental exams once or twice a year. Routine eye exams. Ask your health care provider how often you should have your eyes checked. Personal lifestyle choices, including: Daily care of your teeth and gums. Regular physical activity. Eating a healthy diet. Avoiding tobacco and drug use. Limiting alcohol use. Practicing safe sex. Taking low-dose aspirin every day. Taking vitamin and mineral supplements as  recommended by your health care provider. What happens during an annual well check? The services and screenings done by your health care provider during your annual well check will depend on your age, overall health, lifestyle risk factors, and family history of disease. Counseling  Your health care provider may ask you questions about your: Alcohol use. Tobacco use. Drug use. Emotional well-being. Home and relationship well-being. Sexual activity. Eating habits. History of falls. Memory and ability to understand (cognition). Work and work Statistician. Reproductive health. Screening  You may have the following tests or measurements: Height, weight, and BMI. Blood pressure. Lipid and cholesterol levels. These may be checked every 5 years, or more frequently if you are over 43 years old. Skin check. Lung cancer screening. You may have this screening every year starting at age 58 if you have a 30-pack-year history of smoking and currently smoke or have quit within the past 15 years. Fecal occult blood test (FOBT) of the stool. You may have this test every year starting at age 62. Flexible sigmoidoscopy or colonoscopy. You may have a sigmoidoscopy every 5 years or a colonoscopy every 10 years starting at age 69. Hepatitis C blood test. Hepatitis B blood test. Sexually transmitted disease (STD) testing. Diabetes screening. This is done by checking your blood sugar (glucose) after you have not eaten for a while (fasting). You may have this done every 1-3 years. Bone density scan. This is done to screen for osteoporosis. You may have this done starting at age 54. Mammogram. This may be done every 1-2 years. Talk to your health care provider about how often you should have regular mammograms. Talk with your health care provider about your test results, treatment options,  and if necessary, the need for more tests. Vaccines  Your health care provider may recommend certain vaccines, such  as: Influenza vaccine. This is recommended every year. Tetanus, diphtheria, and acellular pertussis (Tdap, Td) vaccine. You may need a Td booster every 10 years. Zoster vaccine. You may need this after age 75. Pneumococcal 13-valent conjugate (PCV13) vaccine. One dose is recommended after age 98. Pneumococcal polysaccharide (PPSV23) vaccine. One dose is recommended after age 60. Talk to your health care provider about which screenings and vaccines you need and how often you need them. This information is not intended to replace advice given to you by your health care provider. Make sure you discuss any questions you have with your health care provider. Document Released: 09/13/2015 Document Revised: 05/06/2016 Document Reviewed: 06/18/2015 Elsevier Interactive Patient Education  2017 [place] Prevention in the Home Falls can cause injuries. They can happen to people of all ages. There are many things you can do to make your home safe and to help prevent falls. What can I do on the outside of my home? Regularly fix the edges of walkways and driveways and fix any cracks. Remove anything that might make you trip as you walk through a door, such as a raised step or threshold. Trim any bushes or trees on the path to your home. Use bright outdoor lighting. Clear any walking paths of anything that might make someone trip, such as rocks or tools. Regularly check to see if handrails are loose or broken. Make sure that both sides of any steps have handrails. Any raised decks and porches should have guardrails on the edges. Have any leaves, snow, or ice cleared regularly. Use sand or salt on walking paths during winter. Clean up any spills in your garage right away. This includes oil or grease spills. What can I do in the bathroom? Use night lights. Install grab bars by the toilet and in the tub and shower. Do not use towel bars as grab bars. Use non-skid mats or decals in the tub or  shower. If you need to sit down in the shower, use a plastic, non-slip stool. Keep the floor dry. Clean up any water that spills on the floor as soon as it happens. Remove soap buildup in the tub or shower regularly. Attach bath mats securely with double-sided non-slip rug tape. Do not have throw rugs and other things on the floor that can make you trip. What can I do in the bedroom? Use night lights. Make sure that you have a light by your bed that is easy to reach. Do not use any sheets or blankets that are too big for your bed. They should not hang down onto the floor. Have a firm chair that has side arms. You can use this for support while you get dressed. Do not have throw rugs and other things on the floor that can make you trip. What can I do in the kitchen? Clean up any spills right away. Avoid walking on wet floors. Keep items that you use a lot in easy-to-reach places. If you need to reach something above you, use a strong step stool that has a grab bar. Keep electrical cords out of the way. Do not use floor polish or wax that makes floors slippery. If you must use wax, use non-skid floor wax. Do not have throw rugs and other things on the floor that can make you trip. What can I do with my stairs? Do not leave  any items on the stairs. Make sure that there are handrails on both sides of the stairs and use them. Fix handrails that are broken or loose. Make sure that handrails are as long as the stairways. Check any carpeting to make sure that it is firmly attached to the stairs. Fix any carpet that is loose or worn. Avoid having throw rugs at the top or bottom of the stairs. If you do have throw rugs, attach them to the floor with carpet tape. Make sure that you have a light switch at the top of the stairs and the bottom of the stairs. If you do not have them, ask someone to add them for you. What else can I do to help prevent falls? Wear shoes that: Do not have high heels. Have  rubber bottoms. Are comfortable and fit you well. Are closed at the toe. Do not wear sandals. If you use a stepladder: Make sure that it is fully opened. Do not climb a closed stepladder. Make sure that both sides of the stepladder are locked into place. Ask someone to hold it for you, if possible. Clearly mark and make sure that you can see: Any grab bars or handrails. First and last steps. Where the edge of each step is. Use tools that help you move around (mobility aids) if they are needed. These include: Canes. Walkers. Scooters. Crutches. Turn on the lights when you go into a dark area. Replace any light bulbs as soon as they burn out. Set up your furniture so you have a clear path. Avoid moving your furniture around. If any of your floors are uneven, fix them. If there are any pets around you, be aware of where they are. Review your medicines with your doctor. Some medicines can make you feel dizzy. This can increase your chance of falling. Ask your doctor what other things that you can do to help prevent falls. This information is not intended to replace advice given to you by your health care provider. Make sure you discuss any questions you have with your health care provider. Document Released: 06/13/2009 Document Revised: 01/23/2016 Document Reviewed: 09/21/2014 Elsevier Interactive Patient Education  2017 [employer].

## 2022-01-23 NOTE — Progress Notes (Signed)
Subjective:   Alexa Orozco is a 76 y.o. female who presents for Medicare Annual (Subsequent) preventive examination. Virtual Visit via Telephone Note  I connected with  [name] on 01/23/22 at 10:00 AM EDT by telephone and verified that I am speaking with the correct person using two identifiers.  Location: Patient: HOME Provider: [facility] Persons participating in the virtual visit: patient/Nurse Health Advisor   I discussed the limitations, risks, security and privacy concerns of performing an evaluation and management service by telephone and the availability of in person appointments. The patient expressed understanding and agreed to proceed.  Interactive audio and video telecommunications were attempted between this nurse and patient, however failed, due to patient having technical difficulties OR patient did not have access to video capability.  We continued and completed visit with audio only.  Some vital signs may be absent or patient reported.   Chriss Driver, LPN  Review of Systems     Cardiac Risk Factors include: advanced age (>50mn, >>46women);hypertension;dyslipidemia;smoking/ tobacco exposure;sedentary lifestyle;obesity (BMI >30kg/m2)     Objective:    Today's Vitals   01/23/22 1002  Weight: 178 lb (80.7 kg)  Height: '5\' 2"'$  (1.575 m)   Body mass index is 32.56 kg/m.     01/23/2022   10:07 AM 05/13/2021   12:00 AM 05/12/2021    1:20 PM 01/02/2021   12:14 PM 12/13/2020    9:56 AM 12/02/2020    5:02 PM 11/08/2020    8:27 AM  Advanced Directives  Does Patient Have a Medical Advance Directive? No Yes Yes Yes Yes Yes No  Type of Advance Directive  [place]   Does patient want to make changes to medical advance directive?  No - Patient declined  No - Patient declined No - Patient declined    Copy of [facility] [place] Chart?    No - copy requested No - copy requested     Would patient like information on creating a medical advance directive? No - Patient declined      No - Patient declined    Current Medications (verified) Outpatient Encounter Medications as of 01/23/2022  Medication Sig   acetaminophen (TYLENOL) 500 MG tablet Take 500-1,000 mg by mouth every 6 (six) hours as needed (pain).   albuterol (PROVENTIL) (2.5 MG/3ML) 0.083% nebulizer solution INHALE THE CONTENTS OF 1 VIAL (3ML) VIA NEBULIZER EVERY 6 HOURS AS NEEDED FOR WHEEZING OR SHORTNESS OF BREATH   albuterol (VENTOLIN HFA) 108 (90 Base) MCG/ACT inhaler USE 2 INHALATIONS ORALLY   EVERY 6 HOURS AS NEEDED FORWHEEZING OR SHORTNESS OF   BREATH   aspirin EC 81 MG EC tablet Take 1 tablet (81 mg total) by mouth daily. Swallow whole.   Budeson-Glycopyrrol-Formoterol (BREZTRI AEROSPHERE) 160-9-4.8 MCG/ACT AERO Inhale 2 puffs into the lungs 2 (two) times daily.   clonazePAM (KLONOPIN) 0.5 MG tablet Take 1 tablet (0.5 mg total) by mouth 2 (two) times daily as needed for anxiety.   clopidogrel (PLAVIX) 75 MG tablet Take 1 tablet (75 mg total) by mouth daily.   cromolyn (OPTICROM) 4 % ophthalmic solution Place 1 drop into both eyes 4 (four) times daily.   diclofenac Sodium (VOLTAREN) 1 % GEL Apply 2 g topically 4 (four) times daily.   eszopiclone 3 MG TABS Take 1 tablet (3 mg total) by mouth at bedtime. Take immediately before bedtime   hydrALAZINE (APRESOLINE) 50 MG tablet Take 1  tablet (50 mg total) by mouth every 6 (six) hours.   levothyroxine (SYNTHROID) 100 MCG tablet TAKE 1 TABLET (100 MCG TOTAL) BY MOUTH DAILY BEFORE BREAKFAST.   meclizine (ANTIVERT) 25 MG tablet Take 0.5 tablets (12.5 mg total) by mouth 3 (three) times daily as needed for dizziness.   montelukast (SINGULAIR) 10 MG tablet TAKE 1 TABLET AT BEDTIME   nitroGLYCERIN (NITROSTAT) 0.4 MG SL tablet Place 1 tablet (0.4 mg total) under the tongue every 5 (five) minutes as needed for chest pain.   pantoprazole (PROTONIX) 40 MG tablet Take 1 tablet  (40 mg total) by mouth every morning.   pravastatin (PRAVACHOL) 20 MG tablet Take 1 tablet (20 mg total) by mouth every morning.   sertraline (ZOLOFT) 100 MG tablet Take 1.5 tablets (150 mg total) by mouth daily.   Trospium Chloride 60 MG CP24 Take 1 capsule (60 mg total) by mouth daily at 2 PM.   Vibegron (GEMTESA) 75 MG TABS Take 75 mg by mouth daily.   No facility-administered encounter medications on file as of 01/23/2022.    Allergies (verified) Bystolic [nebivolol hcl], Cephalexin, Effexor [venlafaxine], and Levaquin [levofloxacin in d5w]   History: Past Medical History:  Diagnosis Date   Allergies    Allergy    Anxiety    Asthma    Bladder cancer ([place])    CAD S/P percutaneous coronary angioplasty 10/2005   Class III Angina --> Cath: 85% -- PCI 3.42m x 161m(4.0 mm) Cypher DES to RCA; Myoview October 2014: normal stress test: LOW RISK; mild anteroapical breast attenuation   Cataract    h/o bilat repair   Claudication ([facility]/10/2012   COPD (chronic obstructive pulmonary disease) ([facility]   Depression 11/29/2013   Emphysema of lung ([facility])    GERD (gastroesophageal reflux disease)    History of palpitations    History of tobacco abuse    Hyperlipidemia    Hypertension    well-controlled   Hypothyroidism    Osteoporosis    Raynaud disease    Past Surgical History:  Procedure Laterality Date   ABDOMINAL HYSTERECTOMY     h/o partila hysterectomy   BREAST BIOPSY  1988   CORONARY ANGIOPLASTY WITH STENT PLACEMENT  10/14/2005   cypher DES (3.92m64m8mm) to high grade RCA lesion   NM MYOVIEW LTD  July 2012 of October 2014   '12: dipyridamole; Normal, low risk study; 2014 - normal stress test: LOW RISK; mild anteroapical breast attenuation   TONSILLECTOMY  1959   TRANSTHORACIC ECHOCARDIOGRAM  05/22/2009   EF=>55%, normal LV systolic function; normal RV systolic function; mild MR/TR   TRANSURETHRAL RESECTION OF BLADDER TUMOR N/A 11/08/2020   Procedure: TRANSURETHRAL RESECTION OF  BLADDER TUMOR (TURBT);  Surgeon: SniBilley CoD;  Location: [facility] ORS;  Service: Urology;  Laterality: N/A;   TRANSURETHRAL RESECTION OF BLADDER TUMOR N/A 12/13/2020   Procedure: TRANSURETHRAL RESECTION OF BLADDER TUMOR (TURBT);  Surgeon: SniBilley CoD;  Location: [facility] ORS;  Service: Urology;  Laterality: N/A;   Family History  Problem Relation Age of Onset   Liver cancer Mother    Heart attack Mother    Heart Problems Sister    Lupus Sister    Liver cancer Brother    Asthma Brother    Breast cancer Neg Hx    Prostate cancer Neg Hx    Bladder Cancer Neg Hx    Kidney cancer Neg Hx    Social History   Socioeconomic History   Marital status: Widowed  Spouse name: Not on file   Number of children: 3   Years of education: 10th    Highest education level: Not on file  Occupational History   Occupation: Retired  Tobacco Use   Smoking status: Every Day    Packs/day: 0.50    Years: 35.00    Pack years: 17.50    Types: Cigarettes    Passive exposure: Never   Smokeless tobacco: Never   Tobacco comments:    11/11/18 still at 1/2ppd  Vaping Use   Vaping Use: Never used  Substance and Sexual Activity   Alcohol use: No   Drug use: No   Sexual activity: Not on file  Other Topics Concern   Not on file  Social History Narrative   She is a widowed mother of 73, grandmother for, great-grandmother 51.  She still smokes about half pack a day.  She doesn't have about 35 years.  She does not drink.   Prior to her symptoms coming on, she used to do all kind of walking around and working in the garden and other activities with her close friend.   Social Determinants of Health   Financial Resource Strain: Low Risk    Difficulty of Paying Living Expenses: Not very hard  Food Insecurity: No Food Insecurity   Worried About Charity fundraiser in the Last Year: Never true   Ran Out of Food in the Last Year: Never true  Transportation Needs: No Transportation Needs   Lack of  Transportation (Medical): No   Lack of Transportation (Non-Medical): No  Physical Activity: Insufficiently Active   Days of Exercise per Week: 3 days   Minutes of Exercise per Session: 30 min  Stress: No Stress Concern Present   Feeling of Stress : Not at all  Social Connections: Moderately Integrated   Frequency of Communication with Friends and Family: More than three times a week   Frequency of Social Gatherings with Friends and Family: More than three times a week   Attends Religious Services: More than 4 times per year   Active Member of [employer] or Organizations: Yes   Attends Archivist Meetings: More than 4 times per year   Marital Status: Widowed    Tobacco Counseling Ready to quit: Not Answered Counseling given: Not Answered Tobacco comments: 11/11/18 still at 1/2ppd   Clinical Intake:  Pre-visit preparation completed: Yes  Pain : No/denies pain     BMI - recorded: 32.56 Nutritional Status: BMI > 30  Obese Nutritional Risks: None Diabetes: No  How often do you need to have someone help you when you read instructions, pamphlets, or other written materials from your doctor or pharmacy?: 1 - Never  Diabetic?NO  Interpreter Needed?: No  Information entered by :: mj Bryttani Blew, lpn   Activities of Daily Living    01/23/2022   10:09 AM 05/13/2021   12:00 AM  In your present state of health, do you have any difficulty performing the following activities:  Hearing? 0 0  Vision? 0 0  Difficulty concentrating or making decisions? 0 0  Walking or climbing stairs? 0 0  Dressing or bathing? 0 0  Doing errands, shopping? 0 0  Preparing Food and eating ? N   Using the Toilet? N   In the past six months, have you accidently leaked urine? Y   Comment Currently due to bladder cancer.   Do you have problems with loss of bowel control? N   Managing your Medications? N  Managing your Finances? N   Housekeeping or managing your Housekeeping? N     Patient Care  Team: Susy Frizzle, MD as PCP - General (Family Medicine) Leonie Man, MD as PCP - Cardiology (Cardiology) Edythe Clarity, [facility] (Pharmacist)  Indicate any recent Medical Services you may have received from other than [facility] providers in the past year (date may be approximate).     Assessment:   This is a routine wellness examination for Amilah.  Hearing/Vision screen Hearing Screening - Comments:: No hearing issues.   Vision Screening - Comments:: Glasses. Dr. Katy Fitch. 2022.  Dietary issues and exercise activities discussed: Current Exercise Habits: Home exercise routine, Type of exercise: walking, Time (Minutes): 30, Frequency (Times/Week): 3, Weekly Exercise (Minutes/Week): 90, Intensity: Mild, Exercise limited by: cardiac condition(s);respiratory conditions(s);orthopedic condition(s)   Goals Addressed             This Visit's Progress    Exercise 3x per week (30 min per time)   On track    Quit smoking / using tobacco   Not on track    Cutting back on cigarettes little by little        Depression Screen    01/23/2022   10:05 AM 01/02/2021   12:15 PM 10/03/2020   12:26 PM 03/09/2018    2:33 PM 05/25/2017    2:13 PM 04/19/2017   12:19 PM 02/08/2017    2:10 PM  PHQ 2/9 Scores  PHQ - 2 Score 0 2 0 '6 4 2 6  '$ PHQ- 9 Score  '9  18 14 2 21    '$ Fall Risk    01/23/2022   10:08 AM 02/06/2021    3:10 PM 01/02/2021   12:15 PM 10/03/2020   12:25 PM 07/26/2019   10:25 AM  Fall Risk   Falls in the past year? 0 0 0 0 0  Comment     Emmi Telephone Survey: data to providers prior to load  Number falls in past yr: 0 0 0 0   Injury with Fall? 0 0 0 0   Risk for fall due to : No Fall Risks Impaired balance/gait No Fall Risks    Follow up Falls prevention discussed  Falls evaluation completed;Falls prevention discussed Falls evaluation completed     FALL RISK PREVENTION PERTAINING TO THE HOME:  Any stairs in or around the home? No  If so, are there any without handrails? No  Home  free of loose throw rugs in walkways, pet beds, electrical cords, etc? Yes  Adequate lighting in your home to reduce risk of falls? Yes   ASSISTIVE DEVICES UTILIZED TO PREVENT FALLS:  Life alert? No  Use of a cane, walker or w/c? No  Grab bars in the bathroom? Yes  Shower chair or bench in shower? No  Elevated toilet seat or a handicapped toilet? Yes   TIMED UP AND GO:  Was the test performed? No .  PHONE VISIT.  Cognitive Function:        01/23/2022   10:10 AM  6CIT Screen  What Year? 0 points  What month? 0 points  What time? 0 points  Count back from 20 0 points  Months in reverse 0 points  Repeat phrase 0 points  Total Score 0 points    Immunizations Immunization History  Administered Date(s) Administered   Fluad Quad(high Dose 65+) 05/16/2021   Influenza, High Dose Seasonal PF 05/24/2014, 05/11/2019   Influenza,inj,Quad PF,6+ Mos 06/20/2015   Pneumococcal Conjugate-13 11/29/2013  Pneumococcal Polysaccharide-23 12/23/2011   Tdap 12/23/2011   Zoster, Live 01/17/2013    TDAP status: Due, Education has been provided regarding the importance of this vaccine. Advised may receive this vaccine at local pharmacy or Health Dept. Aware to provide a copy of the vaccination record if obtained from local pharmacy or Health Dept. Verbalized acceptance and understanding.  Flu Vaccine status: Up to date  Pneumococcal vaccine status: Up to date  Covid-19 vaccine status: Declined, Education has been provided regarding the importance of this vaccine but patient still declined. Advised may receive this vaccine at local pharmacy or Health Dept.or vaccine clinic. Aware to provide a copy of the vaccination record if obtained from local pharmacy or Health Dept. Verbalized acceptance and understanding.  Qualifies for Shingles Vaccine? Yes   Zostavax completed Yes   Shingrix Completed?: No.    Education has been provided regarding the importance of this vaccine. Patient has been  advised to call insurance company to determine out of pocket expense if they have not yet received this vaccine. Advised may also receive vaccine at local pharmacy or Health Dept. Verbalized acceptance and understanding.  Screening Tests Health Maintenance  Topic Date Due   COVID-19 Vaccine (1) 02/08/2022 (Originally 06/03/1946)   Pneumonia Vaccine 57+ Years old (56 - PPSV23 if available, else PCV20) 08/28/2022 (Originally 12/22/2016)   TETANUS/TDAP  08/28/2022 (Originally 12/22/2021)   Zoster Vaccines- Shingrix (1 of 2) 08/28/2022 (Originally 12/03/1964)   MAMMOGRAM  01/24/2023 (Originally 12/11/2019)   COLONOSCOPY (Pts 45-32yr Insurance coverage will need to be confirmed)  01/24/2023 (Originally 09/01/2015)   INFLUENZA VACCINE  03/31/2022   DEXA SCAN  Completed   Hepatitis C Screening  Completed   HPV VACCINES  Aged Out    Health Maintenance  There are no preventive care reminders to display for this patient.   Colorectal cancer screening: Type of screening: Colonoscopy. Completed 08/31/2005. Repeat every 10 years  Mammogram status: Completed 12/10/2017. Repeat every year  Bone Density status: Completed 02/15/2012. Results reflect: Bone density results: OSTEOPOROSIS. Repeat every 2 years.  Lung Cancer Screening: (Low Dose CT Chest recommended if Age 74-80years, 30 pack-year currently smoking OR have quit w/in 15years.) does qualify.   Lung Cancer Screening Referral: ORDER PLACED.  Additional Screening:  Hepatitis C Screening: does qualify; Completed 08/11/2018  Vision Screening: Recommended annual ophthalmology exams for early detection of glaucoma and other disorders of the eye. Is the patient up to date with their annual eye exam?  Yes  Who is the provider or what is the name of the office in which the patient attends annual eye exams? Dr. GKaty FitchIf pt is not established with a provider, would they like to be referred to a provider to establish care? No .   Dental Screening:  Recommended annual dental exams for proper oral hygiene  Community Resource Referral / Chronic Care Management: CRR required this visit?  No   CCM required this visit?  No      Plan:     I have personally reviewed and noted the following in the patient's chart:   Medical and social history Use of alcohol, tobacco or illicit drugs  Current medications and supplements including opioid prescriptions.  Functional ability and status Nutritional status Physical activity Advanced directives List of other physicians Hospitalizations, surgeries, and ER visits in previous 12 months Vitals Screenings to include cognitive, depression, and falls Referrals and appointments  In addition, I have reviewed and discussed with patient certain preventive protocols, quality metrics, and best  practice recommendations. A written personalized care plan for preventive services as well as general preventive health recommendations were provided to patient.     Chriss Driver, LPN   12/25/621   Nurse Notes: Discussed Mammogram, Colonoscopy and DEXA. Pt declined all but is willing to do Cologuard. Order placed. Discussed Shingrix and Tdap and how to obtain.

## 2022-01-29 ENCOUNTER — Ambulatory Visit (INDEPENDENT_AMBULATORY_CARE_PROVIDER_SITE_OTHER): Payer: Medicare HMO | Admitting: Urology

## 2022-01-29 VITALS — BP 144/80 | HR 67 | Ht 62.0 in | Wt 178.0 lb

## 2022-01-29 DIAGNOSIS — Z8551 Personal history of malignant neoplasm of bladder: Secondary | ICD-10-CM

## 2022-01-29 DIAGNOSIS — N3281 Overactive bladder: Secondary | ICD-10-CM

## 2022-01-29 DIAGNOSIS — C679 Malignant neoplasm of bladder, unspecified: Secondary | ICD-10-CM

## 2022-01-29 NOTE — Progress Notes (Signed)
Bladder cancer surveillance note     UROLOGIC HISTORY Alexa Orozco is a 76 y.o. female who originally presented with gross hematuria and on CT was found to have a large 7+ cm bladder tumor at the right lateral wall, posterior wall, and anteriorly.   Initial Diagnosis of Bladder  Year: 10/2020 Pathology: High-grade T1 urothelial cell carcinoma   11/2020: Second look TURBT-pathology showing high-grade T1, muscle present and not involved     Treatments for Bladder Cancer Initial TURBT 10/2020 TURBT second look 11/2020 Induction BCG x6 completed July 2022 mBCG #1 05/2021 mBCG #2 10/2021   AUA Risk Category High   Cystoscopy Procedure Note:   After informed consent and discussion of the procedure and its risks, Alexa Orozco was positioned and prepped in the standard fashion. Cystoscopy was performed with the a flexible cystoscope. The urethra, bladder neck and entire bladder was visualized in a standard fashion.  Bladder mucosa grossly normal throughout, no tumor seen.  No abnormalities on retroflexion.  Extensive scar at right side of the bladder.  -Call with cytology results -Continue trospium for OAB -RTC cystoscopy 3 to 4 months, if normal next BCG due September 2023 -We reviewed the AUA guidelines regarding ongoing maintenance BCG and surveillance cystoscopy every 3 to 4 months for the first 2 years   [name], MD 01/29/2022

## 2022-01-29 NOTE — Addendum Note (Signed)
Addended by: Donalee Citrin on: 01/29/2022 04:04 PM   Modules accepted: Orders

## 2022-01-30 LAB — URINALYSIS, COMPLETE
Bilirubin, UA: NEGATIVE
Glucose, UA: NEGATIVE
Ketones, UA: NEGATIVE
Leukocytes,UA: NEGATIVE
Nitrite, UA: NEGATIVE
Protein,UA: NEGATIVE
RBC, UA: NEGATIVE
Specific Gravity, UA: 1.02 (ref 1.005–1.030)
Urobilinogen, Ur: 0.2 mg/dL (ref 0.2–1.0)
pH, UA: 5.5 (ref 5.0–7.5)

## 2022-01-30 LAB — MICROSCOPIC EXAMINATION: Bacteria, UA: NONE SEEN

## 2022-02-02 ENCOUNTER — Other Ambulatory Visit: Payer: Self-pay | Admitting: Family Medicine

## 2022-02-02 LAB — CYTOLOGY - NON PAP

## 2022-02-04 ENCOUNTER — Other Ambulatory Visit: Payer: Medicare HMO | Admitting: Urology

## 2022-02-12 ENCOUNTER — Other Ambulatory Visit: Payer: Self-pay

## 2022-02-12 ENCOUNTER — Other Ambulatory Visit: Payer: Self-pay | Admitting: Family Medicine

## 2022-02-12 MED ORDER — ESZOPICLONE 3 MG PO TABS
3.0000 mg | ORAL_TABLET | Freq: Every day | ORAL | 2 refills | Status: DC
Start: 2022-02-12 — End: 2022-02-12

## 2022-02-12 MED ORDER — ESZOPICLONE 3 MG PO TABS
3.0000 mg | ORAL_TABLET | Freq: Every day | ORAL | 2 refills | Status: DC
Start: 2022-02-12 — End: 2022-05-11

## 2022-02-12 MED ORDER — ESZOPICLONE 3 MG PO TABS: 3.0000 mg | ORAL_TABLET | Freq: Every day | ORAL | 2 refills | Status: DC

## 2022-02-12 NOTE — Progress Notes (Signed)
Error, pls disregard

## 2022-02-12 NOTE — Telephone Encounter (Signed)
LOV 11/21/21 Last refill 10/12/21, #30, 2 refills  Please review, thanks!

## 2022-02-13 LAB — COLOGUARD: COLOGUARD: POSITIVE — AB

## 2022-02-18 ENCOUNTER — Other Ambulatory Visit: Payer: Self-pay

## 2022-02-18 DIAGNOSIS — Z1211 Encounter for screening for malignant neoplasm of colon: Secondary | ICD-10-CM

## 2022-02-19 ENCOUNTER — Encounter: Payer: Self-pay | Admitting: *Deleted

## 2022-02-19 ENCOUNTER — Encounter: Payer: Self-pay | Admitting: Internal Medicine

## 2022-02-23 ENCOUNTER — Telehealth: Payer: Self-pay

## 2022-02-23 NOTE — Telephone Encounter (Signed)
Spoke with Alexa Orozco, wanted to confirm which dosage Alexa Orozco should be taking for Levothyroxine?  Told Alexa Orozco per Alexa Orozco med list/chart she should be taking the 111mg NOT the 827m.   Per Alexa Orozco she received the 8863mfrom her mail ordNVR Incold Alexa Orozco to disregard the 80m32mnd continue with the 100mc21mr her chart.  Told Alexa Orozco I will call [facility] to discontinue the refills for the 80mcg82mshe won't get it in the future.

## 2022-03-04 ENCOUNTER — Telehealth: Payer: Self-pay | Admitting: Pharmacist

## 2022-03-04 NOTE — Progress Notes (Unsigned)
Chronic Care Management Pharmacy Assistant   Name: Alexa Orozco  MRN: [id number] DOB: 12-04-1945   Reason for Encounter: General Adherence Call    Recent office visits:  None since last CPP visit  Recent consult visits:  02/18/2022 OV (Ortho Surg) Alexa Luz, MD; no medication changes indicated.  01/21/2022 OV (Ortho Surg) Alexa Luz, MD; no medication changes indicated.  Hospital visits:  None in previous 6 months  Medications: Outpatient Encounter Medications as of 03/04/2022  Medication Sig   acetaminophen (TYLENOL) 500 MG tablet Take 500-1,000 mg by mouth every 6 (six) hours as needed (pain).   albuterol (PROVENTIL) (2.5 MG/3ML) 0.083% nebulizer solution INHALE THE CONTENTS OF 1 VIAL (3ML) VIA NEBULIZER EVERY 6 HOURS AS NEEDED FOR WHEEZING OR SHORTNESS OF BREATH   albuterol (VENTOLIN HFA) 108 (90 Base) MCG/ACT inhaler USE 2 INHALATIONS ORALLY   EVERY 6 HOURS AS NEEDED FORWHEEZING OR SHORTNESS OF   BREATH   aspirin EC 81 MG EC tablet Take 1 tablet (81 mg total) by mouth daily. Swallow whole.   Budeson-Glycopyrrol-Formoterol (BREZTRI AEROSPHERE) 160-9-4.8 MCG/ACT AERO Inhale 2 puffs into the lungs 2 (two) times daily.   clonazePAM (KLONOPIN) 0.5 MG tablet Take 1 tablet (0.5 mg total) by mouth 2 (two) times daily as needed for anxiety.   clopidogrel (PLAVIX) 75 MG tablet Take 1 tablet (75 mg total) by mouth daily.   cromolyn (OPTICROM) 4 % ophthalmic solution Place 1 drop into both eyes 4 (four) times daily.   diclofenac Sodium (VOLTAREN) 1 % GEL Apply 2 g topically 4 (four) times daily.   eszopiclone 3 MG TABS Take 1 tablet (3 mg total) by mouth at bedtime. Take immediately before bedtime   hydrALAZINE (APRESOLINE) 50 MG tablet Take 1 tablet (50 mg total) by mouth every 6 (six) hours.   levothyroxine (SYNTHROID) 100 MCG tablet TAKE 1 TABLET (100 MCG TOTAL) BY MOUTH DAILY BEFORE BREAKFAST.   meclizine (ANTIVERT) 25 MG tablet Take 0.5 tablets (12.5 mg total) by mouth  3 (three) times daily as needed for dizziness.   montelukast (SINGULAIR) 10 MG tablet TAKE 1 TABLET AT BEDTIME   nitroGLYCERIN (NITROSTAT) 0.4 MG SL tablet Place 1 tablet (0.4 mg total) under the tongue every 5 (five) minutes as needed for chest pain.   pantoprazole (PROTONIX) 40 MG tablet Take 1 tablet (40 mg total) by mouth every morning.   pravastatin (PRAVACHOL) 20 MG tablet Take 1 tablet (20 mg total) by mouth every morning.   sertraline (ZOLOFT) 100 MG tablet Take 1.5 tablets (150 mg total) by mouth daily.   Trospium Chloride 60 MG CP24 Take 1 capsule (60 mg total) by mouth daily at 2 PM.   Vibegron (GEMTESA) 75 MG TABS Take 75 mg by mouth daily.   No facility-administered encounter medications on file as of 03/04/2022.   Contacted Alexa Orozco for General Review Call  [address] [place] [address] -hasn't scheduled physical yet -positive Cologuard has an upcoming appt with GI   Chart Review:  Have there been any documented new, changed, or discontinued medications since last visit? {yes/no:20286} (If yes, include name, dose, frequency, date) Has there been any documented recent hospitalizations or ED visits since last visit with Clinical Pharmacist? {yes/no:20286} Brief Summary (including medication and/or Diagnosis changes):   Adherence Review:  Does the Clinical Pharmacist Assistant have access to adherence rates? {yes/no:20286} Adherence rates for STAR metric medications (List medication(s)/day supply/ last 2 fill dates). Adherence rates for medications indicated for  disease state being reviewed (List medication(s)/day supply/ last 2 fill dates). Does the patient have >5 day gap between last estimated fill dates for any of the above medications or other medication gaps? {yes/no:20286} Reason for medication gaps.   Disease State Questions:  Able to connect with Patient? {yes/no:20286} Did patient have any problems with their health recently?  {yes/no:20286} Note problems and Concerns: Have you had any admissions or emergency room visits or worsening of your condition(s) since last visit? {yes/no:20286} Details of ED visit, hospital visit and/or worsening condition(s): Have you had any visits with new specialists or providers since your last visit? {yes/no:20286} Explain: Have you had any new health care problem(s) since your last visit? {yes/no:20286} New problem(s) reported: Have you run out of any of your medications since you last spoke with clinical pharmacist? {yes/no:20286} What caused you to run out of your medications? Are there any medications you are not taking as prescribed? {yes/no:20286} What kept you from taking your medications as prescribed? Are you having any issues or side effects with your medications? {yes/no:20286} Note of issues or side effects: Do you have any other health concerns or questions you want to discuss with your Clinical Pharmacist before your next visit? {yes/no:20286} Note additional concerns and questions from Patient. Are there any health concerns that you feel we can do a better job addressing? {yes/no:20286} Note Patient's response. Are you having any problems with any of the following since the last visit: (select all that apply)  {General Call:27390}  Details: 12. Any falls since last visit? {yes/no:20286}  Details: 13. Any increased or uncontrolled pain since last visit? {yes/no:20286}  Details: 14. Next visit Type: {Telephone/Office:25179}       Visit with:        Date:        Time:  24. Additional Details? {yes/no:20286}   Care Gaps: Medicare Annual Wellness: Completed 01/23/2022 Hemoglobin A1C: None Available Colonoscopy: Last completed 08/31/2005 Cologuard: Completed 02/01/2022 Dexa Scan: Completed Mammogram: Last completed 12/10/2017  Future Appointments  Date Time Provider Reading  03/26/2022 10:00 AM Alexa Needs, NP [facility]  04/16/2022  9:00 AM  [facility] PHARMACIST [facility]  06/04/2022  2:30 PM Alexa Co, MD [facility] None  01/29/2023 10:00 AM [facility]-NURSE HEALTH ADVISOR [facility]   Star Rating Drugs: Pravastatin 20 mg last filled 12/15/2021 90 DS  Alexa Orozco, [place] Pharmacist Assistant [phone]

## 2022-03-26 ENCOUNTER — Encounter: Payer: Self-pay | Admitting: *Deleted

## 2022-03-26 ENCOUNTER — Ambulatory Visit (INDEPENDENT_AMBULATORY_CARE_PROVIDER_SITE_OTHER): Payer: Medicare HMO | Admitting: Gastroenterology

## 2022-03-26 ENCOUNTER — Encounter: Payer: Self-pay | Admitting: Gastroenterology

## 2022-03-26 DIAGNOSIS — K5900 Constipation, unspecified: Secondary | ICD-10-CM | POA: Diagnosis not present

## 2022-03-26 DIAGNOSIS — R195 Other fecal abnormalities: Secondary | ICD-10-CM

## 2022-03-26 MED ORDER — PEG 3350-KCL-NA BICARB-NACL 420 G PO SOLR: 4000.0000 mL | Freq: Once | ORAL | 0 refills | Status: AC

## 2022-03-26 NOTE — Patient Instructions (Addendum)
Continue Benefiber daily. I would recommend taking stool softeners daily. On any given day you don't have a bowel movement, you can take 1 capful of Miralax. Please call if this is not helpful, as we want to make sure you are emptying well prior to the colonoscopy.   We are arranging a colonoscopy in the near future!  It was a pleasure to see you today. I want to create trusting relationships with patients to provide genuine, compassionate, and quality care. I value your feedback. If you receive a survey regarding your visit,  I greatly appreciate you taking time to fill this out.   Annitta Needs, PhD, ANP-BC [facility]

## 2022-03-26 NOTE — H&P (View-Only) (Signed)
Gastroenterology Office Note    Referring Provider: Susy Frizzle, MD Primary Care Physician:  Susy Frizzle, MD  Primary GI: Dr. Gala Romney    Chief Complaint   Chief Complaint  Patient presents with   New Patient (Initial Visit)    Positive cologuard     History of Present Illness   Alexa Orozco is a 76 y.o. female presenting today at the request of Susy Frizzle, MD due to recent positive Cologuard. Last colonoscopy 2006 by Dr. Olevia Perches with multiple polyps, hyperplastic path. Remote history of adenomatous polyps in 2001.   Takes stool softeners but not every day. Dealing with constipation for past few months. Has BM three times a week. Sometimes straining. Has been on Benefiber for about a week. No rectal bleeding. Feels some improvement with Benefiber.   No weight loss or lack of appetite. Solid food dysphagia. A few times a week. Declining EGD right now. Multiple family issues going on at this time.   Past Medical History:  Diagnosis Date   Allergies    Allergy    Anxiety    Asthma    Bladder cancer ([place])    CAD S/P percutaneous coronary angioplasty 10/2005   Class III Angina --> Cath: 85% -- PCI 3.52m x 172m(4.0 mm) Cypher DES to RCA; Myoview October 2014: normal stress test: LOW RISK; mild anteroapical breast attenuation   Cataract    h/o bilat repair   Claudication ([facility]/10/2012   COPD (chronic obstructive pulmonary disease) ([facility]   Depression 11/29/2013   Emphysema of lung ([facility])    GERD (gastroesophageal reflux disease)    History of palpitations    History of tobacco abuse    Hyperlipidemia    Hypertension    well-controlled   Hypothyroidism    Osteoporosis    Raynaud disease     Past Surgical History:  Procedure Laterality Date   ABDOMINAL HYSTERECTOMY     h/o partila hysterectomy   BREAST BIOPSY  08/31/1986   COLONOSCOPY  2006   Dr. BrOlevia Perchesmultiple hyperplastic polyps   COLONOSCOPY  2001   adenomatous polyps   CORONARY ANGIOPLASTY  WITH STENT PLACEMENT  10/14/2005   cypher DES (3.91m56m8mm) to high grade RCA lesion   NM MYOVIEW LTD  July 2012 of October 2014   '12: dipyridamole; Normal, low risk study; 2014 - normal stress test: LOW RISK; mild anteroapical breast attenuation   TONSILLECTOMY  08/31/1957   TRANSTHORACIC ECHOCARDIOGRAM  05/22/2009   EF=>55%, normal LV systolic function; normal RV systolic function; mild MR/TR   TRANSURETHRAL RESECTION OF BLADDER TUMOR N/A 11/08/2020   Procedure: TRANSURETHRAL RESECTION OF BLADDER TUMOR (TURBT);  Surgeon: SniBilley CoD;  Location: [facility] ORS;  Service: Urology;  Laterality: N/A;   TRANSURETHRAL RESECTION OF BLADDER TUMOR N/A 12/13/2020   Procedure: TRANSURETHRAL RESECTION OF BLADDER TUMOR (TURBT);  Surgeon: SniBilley CoD;  Location: [facility] ORS;  Service: Urology;  Laterality: N/A;    Current Outpatient Medications  Medication Sig Dispense Refill   acetaminophen (TYLENOL) 500 MG tablet Take 500-1,000 mg by mouth every 6 (six) hours as needed (pain).     albuterol (PROVENTIL) (2.5 MG/3ML) 0.083% nebulizer solution INHALE THE CONTENTS OF 1 VIAL (3ML) VIA NEBULIZER EVERY 6 HOURS AS NEEDED FOR WHEEZING OR SHORTNESS OF BREATH 150 mL 1   albuterol (VENTOLIN HFA) 108 (90 Base) MCG/ACT inhaler USE 2 INHALATIONS ORALLY   EVERY 6 HOURS AS NEEDED FORWHEEZING OR SHORTNESS OF   BREATH 17  g 2   aspirin EC 81 MG EC tablet Take 1 tablet (81 mg total) by mouth daily. Swallow whole. 30 tablet 11   Budeson-Glycopyrrol-Formoterol (BREZTRI AEROSPHERE) 160-9-4.8 MCG/ACT AERO Inhale 2 puffs into the lungs 2 (two) times daily. 10.7 g 11   clonazePAM (KLONOPIN) 0.5 MG tablet Take 1 tablet (0.5 mg total) by mouth 2 (two) times daily as needed for anxiety. 30 tablet 0   clopidogrel (PLAVIX) 75 MG tablet Take 1 tablet (75 mg total) by mouth daily. 90 tablet 3   cromolyn (OPTICROM) 4 % ophthalmic solution Place 1 drop into both eyes 4 (four) times daily.     diclofenac Sodium (VOLTAREN) 1 % GEL  Apply 2 g topically 4 (four) times daily. 100 g 3   eszopiclone 3 MG TABS Take 1 tablet (3 mg total) by mouth at bedtime. Take immediately before bedtime 30 tablet 2   levothyroxine (SYNTHROID) 100 MCG tablet TAKE 1 TABLET (100 MCG TOTAL) BY MOUTH DAILY BEFORE BREAKFAST. 90 tablet 3   meclizine (ANTIVERT) 25 MG tablet Take 0.5 tablets (12.5 mg total) by mouth 3 (three) times daily as needed for dizziness. 30 tablet 0   montelukast (SINGULAIR) 10 MG tablet TAKE 1 TABLET AT BEDTIME 90 tablet 1   nitroGLYCERIN (NITROSTAT) 0.4 MG SL tablet Place 1 tablet (0.4 mg total) under the tongue every 5 (five) minutes as needed for chest pain. 25 tablet 6   pantoprazole (PROTONIX) 40 MG tablet Take 1 tablet (40 mg total) by mouth every morning. 90 tablet 3   pravastatin (PRAVACHOL) 20 MG tablet Take 1 tablet (20 mg total) by mouth every morning. 90 tablet 3   sertraline (ZOLOFT) 100 MG tablet Take 1.5 tablets (150 mg total) by mouth daily. 135 tablet 3   Trospium Chloride 60 MG CP24 Take 1 capsule (60 mg total) by mouth daily at 2 PM. 90 capsule 3   No current facility-administered medications for this visit.    Allergies as of 03/26/2022 - Review Complete 03/26/2022  Allergen Reaction Noted   Bystolic [nebivolol hcl] Hives 05/30/2013   Cephalexin Hives    Effexor [venlafaxine] Other (See Comments) 04/25/2019   Levaquin [levofloxacin in d5w] Other (See Comments) 10/26/2016    Family History  Problem Relation Age of Onset   Liver cancer Mother    Heart attack Mother    Heart Problems Sister    Lupus Sister    Liver cancer Brother    Asthma Brother    Breast cancer Neg Hx    Prostate cancer Neg Hx    Bladder Cancer Neg Hx    Kidney cancer Neg Hx    Colon cancer Neg Hx     Social History   Socioeconomic History   Marital status: Widowed    Spouse name: Not on file   Number of children: 3   Years of education: 10th    Highest education level: Not on file  Occupational History    Occupation: Retired  Tobacco Use   Smoking status: Every Day    Packs/day: 0.50    Years: 35.00    Total pack years: 17.50    Types: Cigarettes    Passive exposure: Never   Smokeless tobacco: Never  Vaping Use   Vaping Use: Never used  Substance and Sexual Activity   Alcohol use: No   Drug use: No   Sexual activity: Not on file  Other Topics Concern   Not on file  Social History Narrative   She is  a widowed mother of 51, grandmother for, great-grandmother 52.  She still smokes about half pack a day.  She doesn't have about 35 years.  She does not drink.   Prior to her symptoms coming on, she used to do all kind of walking around and working in the garden and other activities with her close friend.   Social Determinants of Health   Financial Resource Strain: Low Risk  (01/23/2022)   Overall Financial Resource Strain (CARDIA)    Difficulty of Paying Living Expenses: Not very hard  Food Insecurity: No Food Insecurity (01/23/2022)   Hunger Vital Sign    Worried About Running Out of Food in the Last Year: Never true    Ran Out of Food in the Last Year: Never true  Transportation Needs: No Transportation Needs (01/23/2022)   PRAPARE - Hydrologist (Medical): No    Lack of Transportation (Non-Medical): No  Physical Activity: Insufficiently Active (01/23/2022)   Exercise Vital Sign    Days of Exercise per Week: 3 days    Minutes of Exercise per Session: 30 min  Stress: No Stress Concern Present (01/23/2022)   [place]    Feeling of Stress : Not at all  Social Connections: Moderately Integrated (01/23/2022)   Social Connection and Isolation Panel [NHANES]    Frequency of Communication with Friends and Family: More than three times a week    Frequency of Social Gatherings with Friends and Family: More than three times a week    Attends Religious Services: More than 4 times per year    Active  Member of [employer] or Organizations: Yes    Attends Archivist Meetings: More than 4 times per year    Marital Status: Widowed  Intimate Partner Violence: Not At Risk (01/23/2022)   Humiliation, Afraid, Rape, and Kick questionnaire    Fear of Current or Ex-Partner: No    Emotionally Abused: No    Physically Abused: No    Sexually Abused: No     Review of Systems   Gen: Denies any fever, chills, fatigue, weight loss, lack of appetite.  CV: Denies chest pain, heart palpitations, peripheral edema, syncope.  Resp: Denies shortness of breath at rest or with exertion. Denies wheezing or cough.  GI: see HPI GU : Denies urinary burning, urinary frequency, urinary hesitancy MS: Denies joint pain, muscle weakness, cramps, or limitation of movement.  Derm: Denies rash, itching, dry skin Psych: Denies depression, anxiety, memory loss, and confusion Heme: Denies bruising, bleeding, and enlarged lymph nodes.   Physical Exam   BP 134/82   Pulse (!) 57   Temp (!) 97.5 F (36.4 C)   Ht '5\' 2"'$  (1.575 m)   Wt 175 lb 3.2 oz (79.5 kg)   BMI 32.04 kg/m  General:   Alert and oriented. Pleasant and cooperative. Well-nourished and well-developed.  Head:  Normocephalic and atraumatic. Eyes:  Without icterus Ears:  Normal auditory acuity. Lungs:  Clear to auscultation bilaterally.  Heart:  S1, S2 present without murmurs appreciated.  Abdomen:  +BS, soft, non-tender and non-distended. No HSM noted. No guarding or rebound. No masses appreciated.  Rectal:  Deferred  Msk:  Symmetrical without gross deformities. Normal posture. Extremities:  Without edema. Neurologic:  Alert and  oriented x4;  grossly normal neurologically. Skin:  Intact without significant lesions or rashes. Psych:  Alert and cooperative. Normal mood and affect.   Assessment   Alexa Orozco is  a 76 y.o. female presenting today as a new patient at the request of Dr. Dennard Schaumann with recent positive Cologuard. History notable for  remote adenomas in 2001 and hyperplastic polyps in 2006.   She has mild constipation, which has slightly improved with Benefiber. We will add stool softeners more consistently and Miralax any given day no BM.   She does note solid food dysphagia as well ;however, she is declining an EGD at this time. Continue PPI daily. Recommend EGD when willing.   PLAN   Proceed with colonoscopy by Dr. Gala Romney in near future: the risks, benefits, and alternatives have been discussed with the patient in detail. The patient states understanding and desires to proceed. ASA 3. On Plavix.   Continue fiber. Add stool softeners daily, Miralax daily any given day no BM  Continue PPI  Call when willing to pursue EGD/dil   Annitta Needs, PhD, ANP-BC [facility]

## 2022-03-26 NOTE — Progress Notes (Signed)
Gastroenterology Office Note    Referring Provider: Susy Frizzle, MD Primary Care Physician:  Susy Frizzle, MD  Primary GI: Dr. Gala Romney    Chief Complaint   Chief Complaint  Patient presents with   New Patient (Initial Visit)    Positive cologuard     History of Present Illness   Alexa Orozco is a 76 y.o. female presenting today at the request of Susy Frizzle, MD due to recent positive Cologuard. Last colonoscopy 2006 by Dr. Olevia Perches with multiple polyps, hyperplastic path. Remote history of adenomatous polyps in 2001.   Takes stool softeners but not every day. Dealing with constipation for past few months. Has BM three times a week. Sometimes straining. Has been on Benefiber for about a week. No rectal bleeding. Feels some improvement with Benefiber.   No weight loss or lack of appetite. Solid food dysphagia. A few times a week. Declining EGD right now. Multiple family issues going on at this time.   Past Medical History:  Diagnosis Date   Allergies    Allergy    Anxiety    Asthma    Bladder cancer ([place])    CAD S/P percutaneous coronary angioplasty 10/2005   Class III Angina --> Cath: 85% -- PCI 3.42m x 140m(4.0 mm) Cypher DES to RCA; Myoview October 2014: normal stress test: LOW RISK; mild anteroapical breast attenuation   Cataract    h/o bilat repair   [place]   [place]   Depression 11/29/2013   Emphysema of lung ([facility])    GERD (gastroesophageal reflux disease)    History of palpitations    History of tobacco abuse    Hyperlipidemia    Hypertension    well-controlled   Hypothyroidism    Osteoporosis    Raynaud disease     Past Surgical History:  Procedure Laterality Date   ABDOMINAL HYSTERECTOMY     h/o partila hysterectomy   BREAST BIOPSY  08/31/1986   COLONOSCOPY  2006   Dr. BrOlevia Perchesmultiple hyperplastic polyps   COLONOSCOPY  2001   adenomatous polyps   CORONARY ANGIOPLASTY  WITH STENT PLACEMENT  10/14/2005   cypher DES (3.48m71m8mm) to high grade RCA lesion   NM MYOVIEW LTD  July 2012 of October 2014   '12: dipyridamole; Normal, low risk study; 2014 - normal stress test: LOW RISK; mild anteroapical breast attenuation   TONSILLECTOMY  08/31/1957   TRANSTHORACIC ECHOCARDIOGRAM  05/22/2009   EF=>55%, normal LV systolic function; normal RV systolic function; mild MR/TR   TRANSURETHRAL RESECTION OF BLADDER TUMOR N/A 11/08/2020   Procedure: TRANSURETHRAL RESECTION OF BLADDER TUMOR (TURBT);  Surgeon: SniBilley CoD;  Location: [facility] ORS;  Service: Urology;  Laterality: N/A;   TRANSURETHRAL RESECTION OF BLADDER TUMOR N/A 12/13/2020   Procedure: TRANSURETHRAL RESECTION OF BLADDER TUMOR (TURBT);  Surgeon: SniBilley CoD;  Location: [facility] ORS;  Service: Urology;  Laterality: N/A;    Current Outpatient Medications  Medication Sig Dispense Refill   acetaminophen (TYLENOL) 500 MG tablet Take 500-1,000 mg by mouth every 6 (six) hours as needed (pain).     albuterol (PROVENTIL) (2.5 MG/3ML) 0.083% nebulizer solution INHALE THE CONTENTS OF 1 VIAL (3ML) VIA NEBULIZER EVERY 6 HOURS AS NEEDED FOR WHEEZING OR SHORTNESS OF BREATH 150 mL 1   albuterol (VENTOLIN HFA) 108 (90 Base) MCG/ACT inhaler USE 2 INHALATIONS ORALLY   EVERY 6 HOURS AS NEEDED FORWHEEZING OR SHORTNESS OF   BREATH 17  g 2   aspirin EC 81 MG EC tablet Take 1 tablet (81 mg total) by mouth daily. Swallow whole. 30 tablet 11   Budeson-Glycopyrrol-Formoterol (BREZTRI AEROSPHERE) 160-9-4.8 MCG/ACT AERO Inhale 2 puffs into the lungs 2 (two) times daily. 10.7 g 11   clonazePAM (KLONOPIN) 0.5 MG tablet Take 1 tablet (0.5 mg total) by mouth 2 (two) times daily as needed for anxiety. 30 tablet 0   clopidogrel (PLAVIX) 75 MG tablet Take 1 tablet (75 mg total) by mouth daily. 90 tablet 3   cromolyn (OPTICROM) 4 % ophthalmic solution Place 1 drop into both eyes 4 (four) times daily.     diclofenac Sodium (VOLTAREN) 1 % GEL  Apply 2 g topically 4 (four) times daily. 100 g 3   eszopiclone 3 MG TABS Take 1 tablet (3 mg total) by mouth at bedtime. Take immediately before bedtime 30 tablet 2   levothyroxine (SYNTHROID) 100 MCG tablet TAKE 1 TABLET (100 MCG TOTAL) BY MOUTH DAILY BEFORE BREAKFAST. 90 tablet 3   meclizine (ANTIVERT) 25 MG tablet Take 0.5 tablets (12.5 mg total) by mouth 3 (three) times daily as needed for dizziness. 30 tablet 0   montelukast (SINGULAIR) 10 MG tablet TAKE 1 TABLET AT BEDTIME 90 tablet 1   nitroGLYCERIN (NITROSTAT) 0.4 MG SL tablet Place 1 tablet (0.4 mg total) under the tongue every 5 (five) minutes as needed for chest pain. 25 tablet 6   pantoprazole (PROTONIX) 40 MG tablet Take 1 tablet (40 mg total) by mouth every morning. 90 tablet 3   pravastatin (PRAVACHOL) 20 MG tablet Take 1 tablet (20 mg total) by mouth every morning. 90 tablet 3   sertraline (ZOLOFT) 100 MG tablet Take 1.5 tablets (150 mg total) by mouth daily. 135 tablet 3   Trospium Chloride 60 MG CP24 Take 1 capsule (60 mg total) by mouth daily at 2 PM. 90 capsule 3   No current facility-administered medications for this visit.    Allergies as of 03/26/2022 - Review Complete 03/26/2022  Allergen Reaction Noted   Bystolic [nebivolol hcl] Hives 05/30/2013   Cephalexin Hives    Effexor [venlafaxine] Other (See Comments) 04/25/2019   Levaquin [levofloxacin in d5w] Other (See Comments) 10/26/2016    Family History  Problem Relation Age of Onset   Liver cancer Mother    Heart attack Mother    Heart Problems Sister    Lupus Sister    Liver cancer Brother    Asthma Brother    Breast cancer Neg Hx    Prostate cancer Neg Hx    Bladder Cancer Neg Hx    Kidney cancer Neg Hx    Colon cancer Neg Hx     Social History   Socioeconomic History   Marital status: Widowed    Spouse name: Not on file   Number of children: 3   Years of education: 10th    Highest education level: Not on file  Occupational History    Occupation: Retired  Tobacco Use   Smoking status: Every Day    Packs/day: 0.50    Years: 35.00    Total pack years: 17.50    Types: Cigarettes    Passive exposure: Never   Smokeless tobacco: Never  Vaping Use   Vaping Use: Never used  Substance and Sexual Activity   Alcohol use: No   Drug use: No   Sexual activity: Not on file  Other Topics Concern   Not on file  Social History Narrative   She is  a widowed mother of 56, grandmother for, great-grandmother 29.  She still smokes about half pack a day.  She doesn't have about 35 years.  She does not drink.   Prior to her symptoms coming on, she used to do all kind of walking around and working in the garden and other activities with her close friend.   Social Determinants of Health   Financial Resource Strain: Low Risk  (01/23/2022)   Overall Financial Resource Strain (CARDIA)    Difficulty of Paying Living Expenses: Not very hard  Food Insecurity: No Food Insecurity (01/23/2022)   Hunger Vital Sign    Worried About Running Out of Food in the Last Year: Never true    Ran Out of Food in the Last Year: Never true  Transportation Needs: No Transportation Needs (01/23/2022)   PRAPARE - Hydrologist (Medical): No    Lack of Transportation (Non-Medical): No  Physical Activity: Insufficiently Active (01/23/2022)   Exercise Vital Sign    Days of Exercise per Week: 3 days    Minutes of Exercise per Session: 30 min  Stress: No Stress Concern Present (01/23/2022)   [place]    Feeling of Stress : Not at all  Social Connections: Moderately Integrated (01/23/2022)   Social Connection and Isolation Panel [NHANES]    Frequency of Communication with Friends and Family: More than three times a week    Frequency of Social Gatherings with Friends and Family: More than three times a week    Attends Religious Services: More than 4 times per year    Active  Member of [employer] or Organizations: Yes    Attends Archivist Meetings: More than 4 times per year    Marital Status: Widowed  Intimate Partner Violence: Not At Risk (01/23/2022)   Humiliation, Afraid, Rape, and Kick questionnaire    Fear of Current or Ex-Partner: No    Emotionally Abused: No    Physically Abused: No    Sexually Abused: No     Review of Systems   Gen: Denies any fever, chills, fatigue, weight loss, lack of appetite.  CV: Denies chest pain, heart palpitations, peripheral edema, syncope.  Resp: Denies shortness of breath at rest or with exertion. Denies wheezing or cough.  GI: see HPI GU : Denies urinary burning, urinary frequency, urinary hesitancy MS: Denies joint pain, muscle weakness, cramps, or limitation of movement.  Derm: Denies rash, itching, dry skin Psych: Denies depression, anxiety, memory loss, and confusion Heme: Denies bruising, bleeding, and enlarged lymph nodes.   Physical Exam   BP 134/82   Pulse (!) 57   Temp (!) 97.5 F (36.4 C)   Ht '5\' 2"'$  (1.575 m)   Wt 175 lb 3.2 oz (79.5 kg)   BMI 32.04 kg/m  General:   Alert and oriented. Pleasant and cooperative. Well-nourished and well-developed.  Head:  Normocephalic and atraumatic. Eyes:  Without icterus Ears:  Normal auditory acuity. Lungs:  Clear to auscultation bilaterally.  Heart:  S1, S2 present without murmurs appreciated.  Abdomen:  +BS, soft, non-tender and non-distended. No HSM noted. No guarding or rebound. No masses appreciated.  Rectal:  Deferred  Msk:  Symmetrical without gross deformities. Normal posture. Extremities:  Without edema. Neurologic:  Alert and  oriented x4;  grossly normal neurologically. Skin:  Intact without significant lesions or rashes. Psych:  Alert and cooperative. Normal mood and affect.   Assessment   Valine Ayotte is  a 76 y.o. female presenting today as a new patient at the request of Dr. Dennard Schaumann with recent positive Cologuard. History notable for  remote adenomas in 2001 and hyperplastic polyps in 2006.   She has mild constipation, which has slightly improved with Benefiber. We will add stool softeners more consistently and Miralax any given day no BM.   She does note solid food dysphagia as well ;however, she is declining an EGD at this time. Continue PPI daily. Recommend EGD when willing.   PLAN   Proceed with colonoscopy by Dr. Gala Romney in near future: the risks, benefits, and alternatives have been discussed with the patient in detail. The patient states understanding and desires to proceed. ASA 3. On Plavix.   Continue fiber. Add stool softeners daily, Miralax daily any given day no BM  Continue PPI  Call when willing to pursue EGD/dil   Annitta Needs, PhD, ANP-BC [facility]

## 2022-03-27 ENCOUNTER — Other Ambulatory Visit: Payer: Self-pay

## 2022-03-27 MED ORDER — LEVOTHYROXINE SODIUM 100 MCG PO TABS: ORAL_TABLET | ORAL | 2 refills | Status: DC

## 2022-03-27 NOTE — Telephone Encounter (Signed)
Pharmacy faxed a refill request for levothyroxine (SYNTHROID) 100 MCG tablet [[id number]]    Order Details Dose, Route, Frequency: As Directed  Dispense Quantity: 90 tablet Refills: 3        Sig: TAKE 1 TABLET (100 MCG TOTAL) BY MOUTH DAILY BEFORE BREAKFAST.       Start Date: 01/23/22 End Date: --  Written Date: 01/23/22 Expiration Date: 01/23/23

## 2022-03-27 NOTE — Telephone Encounter (Signed)
Requested Prescriptions  Pending Prescriptions Disp Refills  . levothyroxine (SYNTHROID) 100 MCG tablet 90 tablet 2    Sig: TAKE 1 TABLET (100 MCG TOTAL) BY MOUTH DAILY BEFORE BREAKFAST.     Endocrinology:  Hypothyroid Agents Passed - 03/27/2022 10:58 AM      Passed - TSH in normal range and within 360 days    TSH  Date Value Ref Range Status  01/22/2022 0.45 0.40 - 4.50 mIU/L Final         Passed - Valid encounter within last 12 months    Recent Outpatient Visits          4 months ago Fatigue, unspecified type   [place] Dennard Schaumann, Cammie Mcgee, MD   8 months ago [place] Susy Frizzle, MD   10 months ago Pneumonia due to COVID-19 virus   [place] Susy Frizzle, MD   10 months ago COPD exacerbation [facility])   [facility] Susy Frizzle, MD   1 year ago COPD exacerbation [facility])   [facility] Eulogio Bear, NP

## 2022-04-16 ENCOUNTER — Telehealth: Payer: Self-pay

## 2022-04-20 ENCOUNTER — Telehealth: Payer: Self-pay

## 2022-04-20 NOTE — Telephone Encounter (Signed)
Pt LM asking for Rx for nausea. Pt stated that she feels nausea after she eats or drink ie coffee.  Pls

## 2022-04-21 NOTE — Progress Notes (Signed)
Chronic Care Management Pharmacy Note  04/30/2022 Name:  Alexa Orozco MRN:  [id number] DOB:  Jul 20, 1946  Summary: PharmD FU visit.  She has gotten her Breztri from Pap program and has been using consistently.  She has been using less and less albuterol since the switch.  Denies any SOB or wheeze.  Due for fasting lipid panel.  Recommendations/Changes made from today's visit: Check lipids - consider increase statin intensity based on results  Plan: FU 3 months   Subjective: Alexa Orozco is an 76 y.o. year old female who is a primary patient of Pickard, Alexa Mcgee, MD.  The CCM team was consulted for assistance with disease management and care coordination needs.    Engaged with patient by telephone for follow up visit in response to provider referral for pharmacy case management and/or care coordination services.   Consent to Services:  The patient was given the following information about Chronic Care Management services today, agreed to services, and gave verbal consent: 1. CCM service includes personalized support from designated clinical staff supervised by the primary care provider, including individualized plan of care and coordination with other care providers 2. 24/7 contact phone numbers for assistance for urgent and routine care needs. 3. Service will only be billed when office clinical staff spend 20 minutes or more in a month to coordinate care. 4. Only one practitioner may furnish and bill the service in a calendar month. 5.The patient may stop CCM services at any time (effective at the end of the month) by phone call to the office staff. 6. The patient will be responsible for cost sharing (co-pay) of up to 20% of the service fee (after annual deductible is met). Patient agreed to services and consent obtained.  Patient Care Team: Alexa Frizzle, MD as PCP - General (Family Medicine) Leonie Man, MD as PCP - Cardiology (Cardiology) Edythe Clarity, [facility] (Pharmacist)  Recent  office visits: 11/21/2021 Dennard Schaumann) - patient is having extreme fatigue patient wants treated between 1-2.  Objective:  Lab Results  Component Value Date   CREATININE 1.31 (H) 11/21/2021   BUN 15 11/21/2021   EGFR 43 (L) 11/21/2021   GFRNONAA >60 05/14/2021   GFRAA 67 02/06/2021   NA 138 11/21/2021   K 4.5 11/21/2021   CALCIUM 9.2 11/21/2021   CO2 24 11/21/2021   GLUCOSE 102 (H) 11/21/2021    No results found for: "HGBA1C", "FRUCTOSAMINE", "GFR", "MICROALBUR"  Last diabetic Eye exam: No results found for: "HMDIABEYEEXA"  Last diabetic Foot exam: No results found for: "HMDIABFOOTEX"   Lab Results  Component Value Date   CHOL 204 (H) 05/13/2021   HDL 75 05/13/2021   LDLCALC 110 (H) 05/13/2021   TRIG 97 05/13/2021   CHOLHDL 2.7 05/13/2021       Latest Ref Rng & Units 11/21/2021    4:06 PM 05/22/2021   12:45 PM 05/15/2021    1:28 AM  Hepatic Function  Total Protein 6.1 - 8.1 g/dL 6.7  6.7  6.3   Albumin 3.5 - 5.0 g/dL   3.0   AST 10 - 35 U/L 24  21  34   ALT 6 - 29 U/L 20  34  49   Alk Phosphatase 38 - 126 U/L   92   Total Bilirubin 0.2 - 1.2 mg/dL 0.3  0.4  0.6   Bilirubin, Direct 0.0 - 0.2 mg/dL   <0.1     Lab Results  Component Value Date/Time   TSH 0.45 01/22/2022 12:45  PM   TSH 5.88 (H) 11/21/2021 04:06 PM   FREET4 1.0 11/21/2021 04:06 PM       Latest Ref Rng & Units 11/21/2021    4:06 PM 05/27/2021   11:21 AM 05/22/2021   12:45 PM  CBC  WBC 3.8 - 10.8 Thousand/uL 8.0  13.5  14.1   Hemoglobin 11.7 - 15.5 g/dL 13.0  11.8  11.7   Hematocrit 35.0 - 45.0 % 38.7  35.4  35.2   Platelets 140 - 400 Thousand/uL 328  300  344     Lab Results  Component Value Date/Time   VD25OH 19 (L) 02/08/2019 09:09 AM    Clinical ASCVD: Yes  The ASCVD Risk score (Arnett DK, et al., 2019) failed to calculate for the following reasons:   The patient has a prior MI or stroke diagnosis       01/23/2022   10:05 AM 01/02/2021   12:15 PM 10/03/2020   12:26 PM  Depression screen  PHQ 2/9  Decreased Interest 0 1 0  Down, Depressed, Hopeless 0 1 0  PHQ - 2 Score 0 2 0  Altered sleeping  3   Tired, decreased energy  3   Change in appetite  0   Feeling bad or failure about yourself   1   Trouble concentrating  0   Moving slowly or fidgety/restless  0   Suicidal thoughts  0   PHQ-9 Score  9   Difficult doing work/chores  Not difficult at all       Social History   Tobacco Use  Smoking Status Every Day   Packs/day: 0.50   Years: 35.00   Total pack years: 17.50   Types: Cigarettes   Passive exposure: Never  Smokeless Tobacco Never   BP Readings from Last 3 Encounters:  04/24/22 115/80  03/26/22 134/82  01/29/22 (!) 144/80   Pulse Readings from Last 3 Encounters:  04/24/22 62  03/26/22 (!) 57  01/29/22 67   Wt Readings from Last 3 Encounters:  04/24/22 171 lb (77.6 kg)  03/26/22 175 lb 3.2 oz (79.5 kg)  01/29/22 178 lb (80.7 kg)   BMI Readings from Last 3 Encounters:  04/24/22 31.28 kg/m  03/26/22 32.04 kg/m  01/29/22 32.56 kg/m    Assessment/Interventions: Review of patient past medical history, allergies, medications, health status, including review of consultants reports, laboratory and other test data, was performed as part of comprehensive evaluation and provision of chronic care management services.   SDOH:  (Social Determinants of Health) assessments and interventions performed: No, done within the year  Financial Resource Strain: Low Risk  (01/23/2022)   Overall Financial Resource Strain (CARDIA)    Difficulty of Paying Living Expenses: Not very hard   Food Insecurity: No Food Insecurity (01/23/2022)   Hunger Vital Sign    Worried About Running Out of Food in the Last Year: Never true    [place] in the Last Year: Never true    SDOH Screenings   Alcohol Screen: Low Risk  (01/23/2022)   Alcohol Screen    Last Alcohol Screening Score (AUDIT): 0  Depression (PHQ2-9): Low Risk  (01/23/2022)   Depression (PHQ2-9)    PHQ-2  Score: 0  Financial Resource Strain: Low Risk  (01/23/2022)   Overall Financial Resource Strain (CARDIA)    Difficulty of Paying Living Expenses: Not very hard  Food Insecurity: No Food Insecurity (01/23/2022)   Hunger Vital Sign    Worried About Running Out of Food in  the Last Year: Never true    [place] in the Last Year: Never true  Housing: Low Risk  (01/23/2022)   Housing    Last Housing Risk Score: 0  Physical Activity: Insufficiently Active (01/23/2022)   Exercise Vital Sign    Days of Exercise per Week: 3 days    Minutes of Exercise per Session: 30 min  Social Connections: Moderately Integrated (01/23/2022)   Social Connection and Isolation Panel [NHANES]    Frequency of Communication with Friends and Family: More than three times a week    Frequency of Social Gatherings with Friends and Family: More than three times a week    Attends Religious Services: More than 4 times per year    Active Member of [employer] or Organizations: Yes    Attends Archivist Meetings: More than 4 times per year    Marital Status: Widowed  Stress: No Stress Concern Present (01/23/2022)   [place]    Feeling of Stress : Not at all  Tobacco Use: High Risk (04/24/2022)   Patient History    Smoking Tobacco Use: Every Day    Smokeless Tobacco Use: Never    Passive Exposure: Never  Transportation Needs: No Transportation Needs (01/23/2022)   PRAPARE - Transportation    Lack of Transportation (Medical): No    Lack of Transportation (Non-Medical): No    CCM Care Plan  Allergies  Allergen Reactions   Bystolic [Nebivolol Hcl] Hives   Cephalexin Hives    Keflex*   Effexor [Venlafaxine] Other (See Comments)    Made her face hot and red   Levaquin [Levofloxacin In D5w] Other (See Comments)    Caused face to turn red and patient became extremely hot     Medications Reviewed Today     Reviewed by Edythe Clarity, [facility]  (Pharmacist) on 04/30/22 at [place] List Status: <None>   Medication Order Taking? Sig Documenting Provider Last Dose Status Informant  acetaminophen (TYLENOL) 500 MG tablet [id number] Yes Take 500-1,000 mg by mouth every 6 (six) hours as needed (pain). [provider] Taking Active Self           Med Note Wilmon Pali, MELISSA R   Fri Apr 17, 2022 11:43 AM)    albuterol (PROVENTIL) (2.5 MG/3ML) 0.083% nebulizer solution [id number] Yes INHALE THE CONTENTS OF 1 VIAL (3ML) VIA NEBULIZER EVERY 6 HOURS AS NEEDED FOR WHEEZING OR SHORTNESS OF Montel Clock, MD Taking Active Self  albuterol (VENTOLIN HFA) 108 (90 Base) MCG/ACT inhaler [id number] Yes USE 2 INHALATIONS ORALLY   EVERY 6 HOURS AS NEEDED FORWHEEZING OR SHORTNESS OF   Montel Clock, MD Taking Active Self  aspirin EC 81 MG EC tablet [id number] Yes Take 1 tablet (81 mg total) by mouth daily. Swallow whole. Barb Merino, MD Taking Active Self  Budeson-Glycopyrrol-Formoterol (BREZTRI AEROSPHERE) 160-9-4.8 MCG/ACT Hollie Salk [id number] Yes Inhale 2 puffs into the lungs 2 (two) times daily. Alexa Frizzle, MD Taking Active Self  clonazePAM Bobbye [place]) 0.5 MG tablet [id number] Yes Take 1 tablet (0.5 mg total) by mouth 2 (two) times daily as needed for anxiety. Alexa Frizzle, MD Taking Active Self           Med Note Wilmon Pali, MELISSA R   Fri Apr 17, 2022 11:43 AM)    clopidogrel (PLAVIX) 75 MG tablet [id number] Yes Take 1 tablet (75 mg total) by mouth daily. Alexa Frizzle, MD Taking Active  Self  cromolyn (OPTICROM) 4 % ophthalmic solution [id number] Yes Place 1 drop into both eyes 4 (four) times daily. [provider] Taking Active Self           Med Note Tamala Julian, Forest Gleason   Mon Jun 22, 2016  4:56 AM)    diclofenac Sodium (VOLTAREN) 1 % GEL [id number] Yes Apply 2 g topically 4 (four) times daily.  Patient taking differently: Apply 2 g topically 4 (four) times daily as needed (pain).   Alexa Frizzle, MD Taking  Active Self  eszopiclone 3 MG TABS [id number] Yes Take 1 tablet (3 mg total) by mouth at bedtime. Take immediately before bedtime Alexa Frizzle, MD Taking Active Self           Med Note Wilmon Pali, MELISSA R   Fri Apr 17, 2022 11:44 AM) [name]   levothyroxine (SYNTHROID) 100 MCG tablet [id number] Yes TAKE 1 TABLET (100 MCG TOTAL) BY MOUTH DAILY BEFORE BREAKFAST. Alexa Frizzle, MD Taking Active Self  meclizine (ANTIVERT) 25 MG tablet [id number] Yes Take 0.5 tablets (12.5 mg total) by mouth 3 (three) times daily as needed for dizziness. Alexa Frizzle, MD Taking Active Self           Med Note Wilmon Pali, MELISSA R   Fri Apr 17, 2022 11:43 AM)    montelukast (SINGULAIR) 10 MG tablet [id number] Yes TAKE 1 TABLET AT BEDTIME Alexa Frizzle, MD Taking Active Self  nitroGLYCERIN (NITROSTAT) 0.4 MG SL tablet [id number] Yes Place 1 tablet (0.4 mg total) under the tongue every 5 (five) minutes as needed for chest pain. Orlena Sheldon, PA-C Taking Active Self  ondansetron (ZOFRAN) 4 MG tablet [id number] Yes Take 1 tablet (4 mg total) by mouth every 8 (eight) hours as needed for nausea or vomiting. Alexa Frizzle, MD Taking Active   pantoprazole (PROTONIX) 40 MG tablet [id number] Yes Take 1 tablet (40 mg total) by mouth every morning. Alexa Frizzle, MD Taking Active Self  pravastatin (PRAVACHOL) 20 MG tablet [id number] Yes Take 1 tablet (20 mg total) by mouth every morning. Alexa Frizzle, MD Taking Active Self  sertraline (ZOLOFT) 100 MG tablet [id number] Yes Take 1.5 tablets (150 mg total) by mouth daily. Alexa Frizzle, MD Taking Active Self  Trospium Chloride 60 MG CP24 [id number] Yes Take 1 capsule (60 mg total) by mouth daily at 2 PM. Debroah Loop, PA-C Taking Active Self            Patient Active Problem List   Diagnosis Date Noted   Positive colorectal cancer screening using Cologuard test 03/26/2022   Constipation 03/26/2022   Primary osteoarthritis of both first  carpometacarpal joints 08/18/2021   Right hand pain 08/18/2021   NSTEMI (non-ST elevated myocardial infarction) ([place]) 05/12/2021   Bladder cancer ([place]) 05/12/2021   COVID-19 virus infection 05/12/2021   Acute on chronic respiratory failure with hypoxia ([place]) 05/12/2021   Insomnia 09/19/2014   Generalized anxiety disorder 09/19/2014   Depression 11/29/2013   Osteoporosis    Obesity (BMI 30-39.9) 05/31/2013   Stable angina ([place]) 05/31/2013   Claudication of left lower extremity ([place]) 05/31/2013   CAD S/P percutaneous coronary angioplasty    Dyslipidemia    Hirsutism 03/03/2011   Essential hypertension    Hypothyroid    Raynaud's disease    COPD (chronic obstructive pulmonary disease) ([place])    Tobacco abuse     Immunization History  Administered Date(s) Administered   Fluad Quad(high Dose 65+) 05/16/2021  Influenza, High Dose Seasonal PF 05/24/2014, 05/11/2019   Influenza,inj,Quad PF,6+ Mos 06/20/2015   Pneumococcal Conjugate-13 11/29/2013   Pneumococcal Polysaccharide-23 12/23/2011   Tdap 12/23/2011   Zoster, Live 01/17/2013    Conditions to be addressed/monitored:  HTN, CAD, COPD, Hypothyroidism, Dyslipidemia, Depression, Insomnia  Care Plan : General Pharmacy (Adult)  Updates made by Edythe Clarity, RPH since 04/30/2022 12:00 AM     Problem: HTN, CAD, COPD, Hypothyroidism, Dyslipidemia, Depression, Insomnia   Priority: High  Onset Date: 01/15/2022     Long-Range Goal: Patient-Specific Goal   Start Date: 01/15/2022  Expected End Date: 07/18/2022  Recent Progress: On track  Priority: High  Note:   Current Barriers:  Elevated LDL  Pharmacist Clinical Goal(s):  Patient will achieve improvement in breathing as evidenced by symptoms through collaboration with PharmD and provider.   Interventions: 1:1 collaboration with Alexa Frizzle, MD regarding development and update of comprehensive plan of care as evidenced by provider attestation and  co-signature Inter-disciplinary care team collaboration (see longitudinal plan of care) Comprehensive medication review performed; medication list updated in electronic medical record  Hypertension (BP goal <130/80) -Controlled, not assessed -Current treatment: None -Medications previously tried: hydralazine  -Current home readings: unknown -Denies hypotensive/hypertensive symptoms -Educated on BP goals and benefits of medications for prevention of heart attack, stroke and kidney damage; Daily salt intake goal < 2300 mg; Importance of home blood pressure monitoring; Symptoms of hypotension and importance of maintaining adequate hydration; -Counseled to monitor BP at home a few times per week, document, and provide log at future appointments -Recommended to continue current medication  Hyperlipidemia: (LDL goal < 70) 04/30/22 -Uncontrolled, most recent lipid panel September 2022 -Current treatment: Pravastatin 25m daily Appropriate, Query effective, ,  -Medications previously tried: none noted  -Educated on Cholesterol goals;  Benefits of statin for ASCVD risk reduction; Importance of limiting foods high in cholesterol; -Recommended to continue current medication -Patient with previous MI, on low intensity statin. -She is due for a fasting lipid panel next month, would recommend checking lipid panel.  If still elevated above goal, would recommend increasing statin intensity to at least moderate intensity.  She denies any previous adverse effects to any other statin. -Would recommend Atorvastatin 233mmoderate) or Atorvastatin 4045mhigh intensity). -Will FU after lipid panel.  COPD (Goal: control symptoms and prevent exacerbations) 09/31/23 -Controlled -Current treatment  Albuterol HFA 39m48mppropriate, Effective, Safe, Accessible Breztri 160-9-4.8mcg50mpuffs twice daily Appropriate, Effective, Safe, Accessible -Medications previously tried: Trelegy($)  -Pulmonary function  testing: Pulmonary Functions Testing Results:  No results found for: FEV1, FVC, FEV1FVC, TLC, DLCO -Exacerbations requiring treatment in last 6 months: none -Patient reports consistent use of maintenance inhaler -Frequency of rescue inhaler use: a few times per day -Counseled on Proper inhaler technique; Benefits of consistent maintenance inhaler use When to use rescue inhaler Differences between maintenance and rescue inhalers -Patient is now getting the [facility] through [place] [employer] patient assistance program.  She reports that she is using as directed and has cut down on her albuterol use by a good amount since starting it.  Denies any recent SOB or wheeze. No changes at this time, counseled on proper use and rinsing mouth out after each use.  Depression/Anxiety (Goal: Rreduce symptoms) -Controlled, not assessed -Current treatment: Sertraline 150mg 82my Appropriate, Effective, Safe, Accessible Clonazepam 0.5mg Ap59mpriate, Effective, Safe, Accessible -Medications previously tried/failed: none noted -PHQ9:     01/02/2021   12:15 PM 10/03/2020   12:26 PM 03/09/2018  2:33 PM  PHQ9 SCORE ONLY  PHQ-9 Total Score 9 0 18   -GAD7:      View : No data to display.           -Educated on Benefits of medication for symptom control -Recommended to continue current medication She reports her living situation has changed and this has improved her mental state.  She does need a refill on clonazepam and uses those prn.   Thyroid (Goal TSH: 1-3 Controlled, not assesed -Current treatment: Levothyroxine 134mg Appropriate, Effective, Safe, Accessible -Counseled to take medication on an empty stomach -Recommended to continue current medication It has been about 7 weeks since dose change.  Reminded her to call and schedule lab follow up.  She still feels tired all of the time.  Suspect some of this could be due to COPD and SOB. She takes medication at correct time.   Patient  Goals/Self-Care Activities Patient will:  - take medications as prescribed as evidenced by patient report and record review focus on medication adherence by pill box collaborate with provider on medication access solutions  Follow Up Plan: The care management team will reach out to the patient again over the next 90 days.           Medication Assistance: Application for Breztri  medication assistance program. in process.  Anticipated assistance start date today.  See plan of care for additional detail.  Compliance/Adherence/Medication fill history: Care Gaps: mammogram  Star-Rating Drugs: Pravastatin 267m07/16/23 90ds  [facility] [address] [id number]: [phone]: [id number]: [phone]: [phone] pill box? Yes Pt endorses 100% compliance  We discussed: Benefits of medication synchronization, packaging and delivery as well as enhanced pharmacist oversight with [employer]. Patient decided to: Continue current medication management strategy  Care Plan and Follow Up Patient Decision:  Patient agrees to Care Plan and Follow-up.  Plan: The care management team will reach out to the patient again over the next 90 days.  ChrBeverly MilchharmD, CPP Clinical Pharmacist Practitioner [phone]

## 2022-04-22 ENCOUNTER — Encounter (HOSPITAL_COMMUNITY)
Admission: RE | Admit: 2022-04-22 | Discharge: 2022-04-22 | Disposition: A | Discharge status: Home / Self Care | Payer: Medicare HMO | Source: Ambulatory Visit | Attending: Internal Medicine | Admitting: Internal Medicine

## 2022-04-23 ENCOUNTER — Telehealth: Payer: Self-pay

## 2022-04-23 NOTE — Telephone Encounter (Signed)
-----   Message from Evelina Bucy, [place] sent at 02/04/2022  2:24 PM EDT ----- Regarding: mBCG Please schedule maintenance BCG for early September, she has follow-up cystoscopy scheduled for October   [name], MD  02/03/2022

## 2022-04-23 NOTE — Telephone Encounter (Signed)
Pt scheduled for 3 mBCG appts, pt confirmed.

## 2022-04-24 ENCOUNTER — Ambulatory Visit (HOSPITAL_BASED_OUTPATIENT_CLINIC_OR_DEPARTMENT_OTHER): Payer: Medicare HMO | Admitting: Anesthesiology

## 2022-04-24 ENCOUNTER — Ambulatory Visit (HOSPITAL_COMMUNITY): Payer: Medicare HMO | Admitting: Anesthesiology

## 2022-04-24 ENCOUNTER — Encounter (HOSPITAL_COMMUNITY): Payer: Self-pay | Admitting: Internal Medicine

## 2022-04-24 ENCOUNTER — Ambulatory Visit (HOSPITAL_COMMUNITY)
Admission: RE | Admit: 2022-04-24 | Discharge: 2022-04-24 | Disposition: A | Discharge status: Home / Self Care | Payer: Medicare HMO | Attending: Internal Medicine | Admitting: Internal Medicine

## 2022-04-24 ENCOUNTER — Other Ambulatory Visit: Payer: Self-pay

## 2022-04-24 ENCOUNTER — Encounter (HOSPITAL_COMMUNITY)
Admission: RE | Disposition: A | Discharge status: Home / Self Care | Payer: Self-pay | Source: Home / Self Care | Attending: Internal Medicine

## 2022-04-24 DIAGNOSIS — K219 Gastro-esophageal reflux disease without esophagitis: Secondary | ICD-10-CM | POA: Insufficient documentation

## 2022-04-24 DIAGNOSIS — Z86010 Personal history of colonic polyps: Secondary | ICD-10-CM | POA: Diagnosis not present

## 2022-04-24 DIAGNOSIS — Z955 Presence of coronary angioplasty implant and graft: Secondary | ICD-10-CM | POA: Insufficient documentation

## 2022-04-24 DIAGNOSIS — R195 Other fecal abnormalities: Secondary | ICD-10-CM | POA: Insufficient documentation

## 2022-04-24 DIAGNOSIS — D123 Benign neoplasm of transverse colon: Secondary | ICD-10-CM | POA: Insufficient documentation

## 2022-04-24 DIAGNOSIS — I25119 Atherosclerotic heart disease of native coronary artery with unspecified angina pectoris: Secondary | ICD-10-CM

## 2022-04-24 DIAGNOSIS — I10 Essential (primary) hypertension: Secondary | ICD-10-CM | POA: Diagnosis not present

## 2022-04-24 DIAGNOSIS — Z639 Problem related to primary support group, unspecified: Secondary | ICD-10-CM | POA: Insufficient documentation

## 2022-04-24 DIAGNOSIS — Z800 Family history of malignant neoplasm of digestive organs: Secondary | ICD-10-CM | POA: Diagnosis not present

## 2022-04-24 DIAGNOSIS — F32A Depression, unspecified: Secondary | ICD-10-CM | POA: Diagnosis not present

## 2022-04-24 DIAGNOSIS — J449 Chronic obstructive pulmonary disease, unspecified: Secondary | ICD-10-CM | POA: Diagnosis not present

## 2022-04-24 DIAGNOSIS — K635 Polyp of colon: Secondary | ICD-10-CM

## 2022-04-24 DIAGNOSIS — Z8249 Family history of ischemic heart disease and other diseases of the circulatory system: Secondary | ICD-10-CM | POA: Diagnosis not present

## 2022-04-24 DIAGNOSIS — F17210 Nicotine dependence, cigarettes, uncomplicated: Secondary | ICD-10-CM

## 2022-04-24 DIAGNOSIS — D124 Benign neoplasm of descending colon: Secondary | ICD-10-CM | POA: Insufficient documentation

## 2022-04-24 DIAGNOSIS — Z1211 Encounter for screening for malignant neoplasm of colon: Secondary | ICD-10-CM | POA: Insufficient documentation

## 2022-04-24 DIAGNOSIS — R1310 Dysphagia, unspecified: Secondary | ICD-10-CM | POA: Diagnosis not present

## 2022-04-24 DIAGNOSIS — K5900 Constipation, unspecified: Secondary | ICD-10-CM | POA: Insufficient documentation

## 2022-04-24 DIAGNOSIS — E039 Hypothyroidism, unspecified: Secondary | ICD-10-CM | POA: Insufficient documentation

## 2022-04-24 DIAGNOSIS — F419 Anxiety disorder, unspecified: Secondary | ICD-10-CM | POA: Diagnosis not present

## 2022-04-24 DIAGNOSIS — K5730 Diverticulosis of large intestine without perforation or abscess without bleeding: Secondary | ICD-10-CM | POA: Insufficient documentation

## 2022-04-24 DIAGNOSIS — Z7902 Long term (current) use of antithrombotics/antiplatelets: Secondary | ICD-10-CM | POA: Diagnosis not present

## 2022-04-24 DIAGNOSIS — Z79899 Other long term (current) drug therapy: Secondary | ICD-10-CM | POA: Insufficient documentation

## 2022-04-24 HISTORY — PX: POLYPECTOMY: SHX5525

## 2022-04-24 HISTORY — PX: COLONOSCOPY WITH PROPOFOL: SHX5780

## 2022-04-24 SURGERY — COLONOSCOPY WITH PROPOFOL
Anesthesia: Monitor Anesthesia Care

## 2022-04-24 MED ORDER — PROPOFOL 500 MG/50ML IV EMUL
INTRAVENOUS | Status: AC
  Filled 2022-04-24: qty 50

## 2022-04-24 MED ORDER — LIDOCAINE HCL (CARDIAC) PF 100 MG/5ML IV SOSY
PREFILLED_SYRINGE | INTRAVENOUS | Status: DC | PRN
  Administered 2022-04-24: 50 mg via INTRAVENOUS

## 2022-04-24 MED ORDER — LIDOCAINE HCL (PF) 2 % IJ SOLN
INTRAMUSCULAR | Status: AC
  Filled 2022-04-24: qty 5

## 2022-04-24 MED ORDER — LACTATED RINGERS IV SOLN
INTRAVENOUS | Status: DC
Start: 2022-04-24 — End: 2022-04-24

## 2022-04-24 MED ORDER — PROPOFOL 500 MG/50ML IV EMUL
INTRAVENOUS | Status: DC | PRN
  Administered 2022-04-24: 150 ug/kg/min via INTRAVENOUS

## 2022-04-24 NOTE — Op Note (Signed)
[facility] Patient Name: Alexa Orozco Procedure Date: 04/24/2022 11:32 AM MRN: [id number] Date of Birth: [birth year] Attending MD: Norvel Richards , MD CSN: [id number] Age: 76 Admit Type: Outpatient Procedure:                Colonoscopy Indications:              Positive Cologuard test Providers:                Norvel Richards, MD, Janeece Riggers, RN, Caprice Kluver Referring MD:              Medicines:                Propofol per Anesthesia Complications:            No immediate complications. Estimated Blood Loss:     Estimated blood loss was minimal. Estimated blood                            loss was minimal. Procedure:                Pre-Anesthesia Assessment:                           - Prior to the procedure, a History and Physical                            was performed, and patient medications and                            allergies were reviewed. The patient's tolerance of                            previous anesthesia was also reviewed. The risks                            and benefits of the procedure and the sedation                            options and risks were discussed with the patient.                            All questions were answered, and informed consent                            was obtained. ASA Grade Assessment: III - A patient                            with severe systemic disease. After reviewing the                            risks and benefits, the patient was deemed in  satisfactory condition to undergo the procedure.                           After obtaining informed consent, the colonoscope                            was passed under direct vision. Throughout the                            procedure, the patient's blood pressure, pulse, and                            oxygen saturations were monitored continuously. The                            [id number]) scope was introduced  through the                            anus and advanced to the the cecum, identified by                            appendiceal orifice and ileocecal valve. The                            colonoscopy was performed without difficulty. The                            patient tolerated the procedure well. The quality                            of the bowel preparation was adequate. Scope In: 11:53:38 AM Scope Out: 12:10:38 PM Scope Withdrawal Time: 0 hours 9 minutes 42 seconds  Total Procedure Duration: 0 hours 17 minutes 0 seconds  Findings:      The perianal and digital rectal examinations were normal.      Three semi-pedunculated polyps were found in the descending colon and       hepatic flexure. The polyps were 5 to 7 mm in size. These polyps were       removed with a cold snare. Resection and retrieval were complete.       Estimated blood loss was minimal.      Scattered small and large-mouthed diverticula were found in the sigmoid       colon and descending colon.      The exam was otherwise without abnormality on direct and retroflexion       views. Impression:               - Three 5 to 7 mm polyps in the descending colon                            and at the hepatic flexure, removed with a cold                            snare. Resected and retrieved.                           -  Diverticulosis in the sigmoid colon and in the                            descending colon.                           - The examination was otherwise normal on direct                            and retroflexion views. Moderate Sedation:      Moderate (conscious) sedation was personally administered by an       anesthesia professional. The following parameters were monitored: oxygen       saturation, heart rate, blood pressure, respiratory rate, EKG, adequacy       of pulmonary ventilation, and response to care. Recommendation:           - Patient has a contact number available for                             emergencies. The signs and symptoms of potential                            delayed complications were discussed with the                            patient. Return to normal activities tomorrow.                            Written discharge instructions were provided to the                            patient.                           - Resume previous diet.                           - Repeat colonoscopy date to be determined after                            pending pathology results are reviewed for                            surveillance.                           - Return to GI office (date not yet determined). Procedure Code(s):        --- Professional ---                           [phone], Colonoscopy, flexible; with removal of                            tumor(s), polyp(s), or other lesion(s) by snare  technique Diagnosis Code(s):        --- Professional ---                           K63.5, Polyp of colon                           R19.5, Other fecal abnormalities                           K57.30, Diverticulosis of large intestine without                            perforation or abscess without bleeding CPT copyright 2019 [employer]. All rights reserved. The codes documented in this report are preliminary and upon coder review may  be revised to meet current compliance requirements. Cristopher Estimable. Leone Mobley, MD Norvel Richards, MD 04/24/2022 12:19:06 PM This report has been signed electronically. Number of Addenda: 0

## 2022-04-24 NOTE — Transfer of Care (Signed)
Immediate Anesthesia Transfer of Care Note  Patient: Alexa Orozco  Procedure(s) Performed: COLONOSCOPY WITH PROPOFOL POLYPECTOMY  Patient Location: PACU and Short Stay  Anesthesia Type:MAC  Level of Consciousness: awake  Airway & Oxygen Therapy: Patient Spontanous Breathing  Post-op Assessment: Report given to RN  Post vital signs: Reviewed  Last Vitals:  Vitals Value Taken Time  BP    Temp    Pulse    Resp    SpO2      Last Pain:  Vitals:   04/24/22 1145  TempSrc:   PainSc: 0-No pain         Complications: There were no known notable events for this encounter.

## 2022-04-24 NOTE — Anesthesia Preprocedure Evaluation (Signed)
Anesthesia Evaluation  Patient identified by MRN, date of birth, ID band Patient awake    Reviewed: Allergy & Precautions, H&P , NPO status , Patient's Chart, lab work & pertinent test results, reviewed documented beta blocker date and time   Airway Mallampati: II  TM Distance: >3 FB Neck ROM: full    Dental no notable dental hx.    Pulmonary asthma , COPD, Current Smoker,    Pulmonary exam normal breath sounds clear to auscultation       Cardiovascular Exercise Tolerance: Good hypertension, + angina + CAD and + Cardiac Stents   Rhythm:regular Rate:Normal     Neuro/Psych PSYCHIATRIC DISORDERS Anxiety Depression negative neurological ROS     GI/Hepatic Neg liver ROS, GERD  Medicated,  Endo/Other  Hypothyroidism   Renal/GU negative Renal ROS  negative genitourinary   Musculoskeletal   Abdominal   Peds  Hematology negative hematology ROS (+)   Anesthesia Other Findings   Reproductive/Obstetrics negative OB ROS                             Anesthesia Physical Anesthesia Plan  ASA: 3  Anesthesia Plan: General   Post-op Pain Management:    Induction:   PONV Risk Score and Plan: Propofol infusion  Airway Management Planned:   Additional Equipment:   Intra-op Plan:   Post-operative Plan:   Informed Consent: I have reviewed the patients History and Physical, chart, labs and discussed the procedure including the risks, benefits and alternatives for the proposed anesthesia with the patient or authorized representative who has indicated his/her understanding and acceptance.     Dental Advisory Given  Plan Discussed with: CRNA  Anesthesia Plan Comments:         Anesthesia Quick Evaluation

## 2022-04-24 NOTE — Interval H&P Note (Signed)
History and Physical Interval Note:  04/24/2022 11:40 AM  Alexa Orozco  has presented today for surgery, with the diagnosis of POSITIVE COLOGUARD.  The various methods of treatment have been discussed with the patient and family. After consideration of risks, benefits and other options for treatment, the patient has consented to  Procedure(s) with comments: COLONOSCOPY WITH PROPOFOL (N/A) - 1:00pm, asa 3 as a surgical intervention.  The patient's history has been reviewed, patient examined, no change in status, stable for surgery.  I have reviewed the patient's chart and labs.  Questions were answered to the patient's satisfaction.     Alexa Orozco  Patient seen and examined.  No change.  Plan for a colonoscopy today to further evaluate positive Cologuard. The risks, benefits, limitations, alternatives and imponderables have been reviewed with the patient. Questions have been answered. All parties are agreeable.

## 2022-04-24 NOTE — Discharge Instructions (Signed)
  Colonoscopy Discharge Instructions  Read the instructions outlined below and refer to this sheet in the next few weeks. These discharge instructions provide you with general information on caring for yourself after you leave the hospital. Your doctor may also give you specific instructions. While your treatment has been planned according to the most current medical practices available, unavoidable complications occasionally occur. If you have any problems or questions after discharge, call Dr. Gala Romney at [phone]. ACTIVITY You may resume your regular activity, but move at a slower pace for the next 24 hours.  Take frequent rest periods for the next 24 hours.  Walking will help get rid of the air and reduce the bloated feeling in your belly (abdomen).  No driving for 24 hours (because of the medicine (anesthesia) used during the test).   Do not sign any important legal documents or operate any machinery for 24 hours (because of the anesthesia used during the test).  NUTRITION Drink plenty of fluids.  You may resume your normal diet as instructed by your doctor.  Begin with a light meal and progress to your normal diet. Heavy or fried foods are harder to digest and may make you feel sick to your stomach (nauseated).  Avoid alcoholic beverages for 24 hours or as instructed.  MEDICATIONS You may resume your normal medications unless your doctor tells you otherwise.  WHAT YOU CAN EXPECT TODAY Some feelings of bloating in the abdomen.  Passage of more gas than usual.  Spotting of blood in your stool or on the toilet paper.  IF YOU HAD POLYPS REMOVED DURING THE COLONOSCOPY: No aspirin products for 7 days or as instructed.  No alcohol for 7 days or as instructed.  Eat a soft diet for the next 24 hours.  FINDING OUT THE RESULTS OF YOUR TEST Not all test results are available during your visit. If your test results are not back during the visit, make an appointment with your caregiver to find out the  results. Do not assume everything is normal if you have not heard from your caregiver or the medical facility. It is important for you to follow up on all of your test results.  SEEK IMMEDIATE MEDICAL ATTENTION IF: You have more than a spotting of blood in your stool.  Your belly is swollen (abdominal distention).  You are nauseated or vomiting.  You have a temperature over 101.  You have abdominal pain or discomfort that is severe or gets worse throughout the day.      3 polyps removed in your colon today   colon polyp and diverticulosis information provided   further recommendations to follow pending review of pathology report   at patient request, I called Juventino Slovak at [phone]-NIOE rolled to voicemail-left a message.

## 2022-04-25 NOTE — Anesthesia Postprocedure Evaluation (Signed)
Anesthesia Post Note  Patient: Alexa Orozco  Procedure(s) Performed: COLONOSCOPY WITH PROPOFOL POLYPECTOMY  Patient location during evaluation: Phase II Anesthesia Type: MAC Level of consciousness: awake Pain management: pain level controlled Vital Signs Assessment: post-procedure vital signs reviewed and stable Respiratory status: spontaneous breathing and respiratory function stable Cardiovascular status: blood pressure returned to baseline and stable Postop Assessment: no headache and no apparent nausea or vomiting Anesthetic complications: no Comments: Late entry   There were no known notable events for this encounter.   Last Vitals:  Vitals:   04/24/22 1111 04/24/22 1221  BP: (!) 140/69 115/80  Pulse: 65 62  Resp: 13 18  Temp: 36.9 C   SpO2: 100% 99%    Last Pain:  Vitals:   04/24/22 1221  TempSrc:   PainSc: 0-No pain                 Louann Sjogren

## 2022-04-27 LAB — SURGICAL PATHOLOGY

## 2022-04-28 ENCOUNTER — Other Ambulatory Visit: Payer: Self-pay | Admitting: Family Medicine

## 2022-04-28 MED ORDER — ONDANSETRON HCL 4 MG PO TABS: 4.0000 mg | ORAL_TABLET | Freq: Three times a day (TID) | ORAL | 0 refills | Status: DC | PRN

## 2022-04-28 NOTE — Telephone Encounter (Signed)
Spoke w/pt, aware Rx sent to pharmacy.   Nothing further.

## 2022-04-29 ENCOUNTER — Encounter: Payer: Self-pay | Admitting: Internal Medicine

## 2022-04-30 ENCOUNTER — Ambulatory Visit: Payer: Medicare HMO | Admitting: Pharmacist

## 2022-04-30 ENCOUNTER — Encounter (HOSPITAL_COMMUNITY): Payer: Self-pay | Admitting: Internal Medicine

## 2022-04-30 DIAGNOSIS — J439 Emphysema, unspecified: Secondary | ICD-10-CM

## 2022-04-30 DIAGNOSIS — E785 Hyperlipidemia, unspecified: Secondary | ICD-10-CM

## 2022-04-30 NOTE — Patient Instructions (Addendum)
Visit Information   Goals Addressed             This Visit's Progress    Track and Manage My Symptoms-COPD   On track    Timeframe:  Long-Range Goal Priority:  High Start Date:   01/15/22                          Expected End Date:  07/18/22                      Follow Up Date 04/17/22    Approved for triple therapy inhaler - Judithann Sauger   Why is this important?   Tracking your symptoms and other information about your health helps your doctor plan your care.  Write down the symptoms, the time of day, what you were doing and what medicine you are taking.  You will soon learn how to manage your symptoms.     Notes:        Patient Care Plan: General Pharmacy (Adult)     Problem Identified: HTN, CAD, COPD, Hypothyroidism, Dyslipidemia, Depression, Insomnia   Priority: High  Onset Date: 01/15/2022     Long-Range Goal: Patient-Specific Goal   Start Date: 01/15/2022  Expected End Date: 07/18/2022  Recent Progress: On track  Priority: High  Note:   Current Barriers:  Elevated LDL  Pharmacist Clinical Goal(s):  Patient will achieve improvement in breathing as evidenced by symptoms through collaboration with PharmD and provider.   Interventions: 1:1 collaboration with Susy Frizzle, MD regarding development and update of comprehensive plan of care as evidenced by provider attestation and co-signature Inter-disciplinary care team collaboration (see longitudinal plan of care) Comprehensive medication review performed; medication list updated in electronic medical record  Hypertension (BP goal <130/80) -Controlled, not assessed -Current treatment: None -Medications previously tried: hydralazine  -Current home readings: unknown -Denies hypotensive/hypertensive symptoms -Educated on BP goals and benefits of medications for prevention of heart attack, stroke and kidney damage; Daily salt intake goal < 2300 mg; Importance of home blood pressure monitoring; Symptoms of  hypotension and importance of maintaining adequate hydration; -Counseled to monitor BP at home a few times per week, document, and provide log at future appointments -Recommended to continue current medication  Hyperlipidemia: (LDL goal < 70) 04/30/22 -Uncontrolled, most recent lipid panel September 2022 -Current treatment: Pravastatin '20mg'$  daily Appropriate, Query effective, ,  -Medications previously tried: none noted  -Educated on Cholesterol goals;  Benefits of statin for ASCVD risk reduction; Importance of limiting foods high in cholesterol; -Recommended to continue current medication -Patient with previous MI, on low intensity statin. -She is due for a fasting lipid panel next month, would recommend checking lipid panel.  If still elevated above goal, would recommend increasing statin intensity to at least moderate intensity.  She denies any previous adverse effects to any other statin. -Would recommend Atorvastatin 57m(moderate) or Atorvastatin '40mg'$  (high intensity). -Will FU after lipid panel.  COPD (Goal: control symptoms and prevent exacerbations) 09/31/23 -Controlled -Current treatment  Albuterol HFA 9574m Appropriate, Effective, Safe, Accessible Breztri 160-9-4.74m53m2 puffs twice daily Appropriate, Effective, Safe, Accessible -Medications previously tried: Trelegy($)  -Pulmonary function testing: Pulmonary Functions Testing Results:  No results found for: FEV1, FVC, FEV1FVC, TLC, DLCO -Exacerbations requiring treatment in last 6 months: none -Patient reports consistent use of maintenance inhaler -Frequency of rescue inhaler use: a few times per day -Counseled on Proper inhaler technique; Benefits of consistent maintenance inhaler use When  to use rescue inhaler Differences between maintenance and rescue inhalers -Patient is now getting the [facility] for free through [place] [employer] patient assistance program.  She reports that she is using as directed and has cut down on her  albuterol use by a good amount since starting it.  Denies any recent SOB or wheeze. No changes at this time, counseled on proper use and rinsing mouth out after each use.  Depression/Anxiety (Goal: Rreduce symptoms) -Controlled, not assessed -Current treatment: Sertraline '150mg'$  daily Appropriate, Effective, Safe, Accessible Clonazepam 0.'5mg'$  Appropriate, Effective, Safe, Accessible -Medications previously tried/failed: none noted -PHQ9:     01/02/2021   12:15 PM 10/03/2020   12:26 PM 03/09/2018    2:33 PM  PHQ9 SCORE ONLY  PHQ-9 Total Score 9 0 18   -GAD7:      View : No data to display.           -Educated on Benefits of medication for symptom control -Recommended to continue current medication She reports her living situation has changed and this has improved her mental state.  She does need a refill on clonazepam and uses those prn.   Thyroid (Goal TSH: 1-3 Controlled, not assesed -Current treatment: Levothyroxine 168mg Appropriate, Effective, Safe, Accessible -Counseled to take medication on an empty stomach -Recommended to continue current medication It has been about 7 weeks since dose change.  Reminded her to call and schedule lab follow up.  She still feels tired all of the time.  Suspect some of this could be due to COPD and SOB. She takes medication at correct time.   Patient Goals/Self-Care Activities Patient will:  - take medications as prescribed as evidenced by patient report and record review focus on medication adherence by pill box collaborate with provider on medication access solutions  Follow Up Plan: The care management team will reach out to the patient again over the next 90 days.          The patient verbalized understanding of instructions, educational materials, and care plan provided today and DECLINED offer to receive copy of patient instructions, educational materials, and care plan.  Telephone follow up appointment with pharmacy team  member scheduled for: 3 months  CEdythe Clarity RLaporte PharmD, CSkagitClinical Pharmacist Practitioner BTrevose([phone]

## 2022-05-05 ENCOUNTER — Ambulatory Visit (INDEPENDENT_AMBULATORY_CARE_PROVIDER_SITE_OTHER): Payer: Medicare HMO | Admitting: Physician Assistant

## 2022-05-05 ENCOUNTER — Other Ambulatory Visit: Payer: Self-pay | Admitting: Family Medicine

## 2022-05-05 ENCOUNTER — Telehealth: Payer: Self-pay

## 2022-05-05 DIAGNOSIS — D494 Neoplasm of unspecified behavior of bladder: Secondary | ICD-10-CM

## 2022-05-05 DIAGNOSIS — N3000 Acute cystitis without hematuria: Secondary | ICD-10-CM | POA: Diagnosis not present

## 2022-05-05 LAB — URINALYSIS, COMPLETE
Bilirubin, UA: NEGATIVE
Glucose, UA: NEGATIVE
Ketones, UA: NEGATIVE
Nitrite, UA: NEGATIVE
Protein,UA: NEGATIVE
RBC, UA: NEGATIVE
Specific Gravity, UA: 1.015 (ref 1.005–1.030)
Urobilinogen, Ur: 0.2 mg/dL (ref 0.2–1.0)
pH, UA: 5 (ref 5.0–7.5)

## 2022-05-05 LAB — MICROSCOPIC EXAMINATION

## 2022-05-05 MED ORDER — SULFAMETHOXAZOLE-TRIMETHOPRIM 800-160 MG PO TABS: 1.0000 | ORAL_TABLET | Freq: Two times a day (BID) | ORAL | 0 refills | Status: AC

## 2022-05-05 NOTE — Telephone Encounter (Signed)
Pt called asking if her colonoscopy results have been reviewed by Dr. Dennard Schaumann yet? If you will review, I will be glad to call the patient back with her results. Thank you.

## 2022-05-05 NOTE — Progress Notes (Signed)
Patient presented to the clinic today for a scheduled BCG instillation.  Urine grossly infected on UA, culture pending.  Patient reports dysuria.  Will treat for acute UTI today.  Prescription for Bactrim DS BID x5 days sent to pharmacy.  Patient to return to clinic next week for next scheduled BCG treatment.  We will add an additional treatment to her scheduled series to make up for today's missed instillation.  Debroah Loop, PA-C 05/05/22 2:25 PM

## 2022-05-07 ENCOUNTER — Other Ambulatory Visit: Payer: Self-pay | Admitting: Family Medicine

## 2022-05-07 MED ORDER — LINACLOTIDE 145 MCG PO CAPS: 145.0000 ug | ORAL_CAPSULE | Freq: Every day | ORAL | 3 refills | Status: DC

## 2022-05-09 LAB — CULTURE, URINE COMPREHENSIVE

## 2022-05-11 ENCOUNTER — Other Ambulatory Visit: Payer: Self-pay | Admitting: Family Medicine

## 2022-05-12 ENCOUNTER — Ambulatory Visit (INDEPENDENT_AMBULATORY_CARE_PROVIDER_SITE_OTHER): Payer: Medicare HMO | Admitting: Physician Assistant

## 2022-05-12 DIAGNOSIS — R310 Gross hematuria: Secondary | ICD-10-CM

## 2022-05-12 DIAGNOSIS — C679 Malignant neoplasm of bladder, unspecified: Secondary | ICD-10-CM | POA: Diagnosis not present

## 2022-05-12 LAB — URINALYSIS, COMPLETE
Bilirubin, UA: NEGATIVE
Glucose, UA: NEGATIVE
Ketones, UA: NEGATIVE
Leukocytes,UA: NEGATIVE
Nitrite, UA: NEGATIVE
Protein,UA: NEGATIVE
Specific Gravity, UA: 1.025 (ref 1.005–1.030)
Urobilinogen, Ur: 0.2 mg/dL (ref 0.2–1.0)
pH, UA: 5 (ref 5.0–7.5)

## 2022-05-12 LAB — MICROSCOPIC EXAMINATION: Bacteria, UA: NONE SEEN

## 2022-05-12 MED ORDER — BCG LIVE 50 MG IS SUSR
3.2400 mL | Freq: Once | INTRAVESICAL | Status: AC
  Administered 2022-05-12: 81 mg via INTRAVESICAL

## 2022-05-12 NOTE — Progress Notes (Signed)
BCG Bladder Instillation  BCG # 1 of 3  Due to Bladder Cancer patient is present today for a BCG treatment. Patient was cleaned and prepped in a sterile fashion with betadine. A 14FR catheter was inserted, urine return was noted 25ml, urine was yellow in color.  50ml of reconstituted BCG was instilled into the bladder. The catheter was then removed. Patient tolerated well, no complications were noted  Performed by: Eniola Cerullo, PA-C and Tarsha Valentine, CMA  Follow up/ Additional notes: 1 week for BCG #2 of 3   

## 2022-05-12 NOTE — Patient Instructions (Addendum)
Your Timeline for Today:  Right now through 4:05pm: Hold your urine and do your quarter turns every 15 minutes. 4:05pm-10:05pm today: Every time you urinate, pour 1/2 cup of bleach into the toilet and let it sit for 15 minutes prior to flushing. 10:05pm onward: Resume your normal routine.   Patient Education: (BCG) Into the Bladder (Intravesical Chemotherapy)  BCG is a vaccine which is used to prevent tuberculosis (TB).  But it's also a helpful treatment for some early bladder cancers.  When BCG goes directly into the bladder the treatment is described as intravesical.  BCG is a type of immunotherapy.  Immunotherapy stimulates the body's immune system to destroy cancer cells.  How it's given BCG treatment is given to you in an outpatient setting.  It takes a few minutes to administer and you can go home as soon as it's finished.  It might be a good idea to ask someone to bring you, particularly the fist time.  Unlike chemotherapy into the bladder, BCG treatment is never given immediately after surgery to remove bladder tumors.  There needs to be a delay usually of at least two weeks after surgery, before you can have it.  You won't be given treatment with BCG if you are unwell or have an infection in your urine.  You're usually asked to limit the amount you drink before your treatment.  This will help to increase the concentration of BCG in your bladder.  Drinking too much before your treatment may make your bladder feel uncomfortably full.  If you normally take water tablets (diuretics) take them later in the day after your treatment.  Your nurse or doctor will give you more advise about preparing for your treatment.  You will have a small tube (catheter) placed into your bladder.  Your doctor will then put the liquid vaccine directly into your bladder through the catheter and remove the catheter.  You will need to hold your urine for two hours afterwards.  Rotating every 15 minutes from side to  side. This can be difficult but it's to give the treatment time to work.  When the treatment is over you can go to the toilet.  After your treatment there are some precautions you'll need to take.  This is because BCG is a live vaccine and other people shouldn't be exposed to it.  For the next six hours, you'll need to avoid your urine splashing on the toilet seat and getting any urine on your hands.  It might be easer for men to sit down when they're using an ordinary toilet although using a stand up urinal should be alright.  The main this is to avoid splashing urine and spreading the vaccine.  You will also be asked to put 1/2 cup undiluted bleach into the toilet to destroy any live vaccine and leave it for 15 minutes until you flush for the next 6 voids.  Side Effects Because BCG goes directly into the bladder most of the side effects are linked with the bladder.  They usually go away within one to two days after your treatment.  The most common ones are: -needing to pass urine often -pain when you pass urine -blood in urine -flu-like symptoms (tiredness, general aching and raised temperature)  Theses side effects should settle down within a day or two.  If they don't get better contact your doctor.  Drinking lots of fluids can help flush the drug out of your bladder and reduce some of these effects.  Taking Ibuprofen or Aleve is encouraged unless you have a condition that would make these medications unsafe to take (renal failure, diabetes, gerd)  Rare side effects can include a continuing high temperature (fever), pain in your joints and a cough.  If you have any of these symptoms, or if you feel generally unwell, contact your doctor.  These symptoms could be a sign of a more serious infection (due to BCG) that needs to be treated immediately.  If this happens you'll be treated with the same drugs (antibiotics) that are used to treat TB.  Contraception Men should use a condom during sex for  the first 48 hours after their treatment.  If you are a women who has had BCG treatment then your partner should use a condom.  Using a condom will protect your partner from any vaccine present in your semen or vaginal fluid.  We don't know how BCG may affect a developing fetus so it's not advisable to become pregnant or father a child while having it.  It is important to use effective contraception during your treatment and for six weeks afterwards.  You can discuss this with your doctor or specialist nurse.

## 2022-05-19 ENCOUNTER — Ambulatory Visit (INDEPENDENT_AMBULATORY_CARE_PROVIDER_SITE_OTHER): Payer: Medicare HMO | Admitting: Physician Assistant

## 2022-05-19 DIAGNOSIS — R310 Gross hematuria: Secondary | ICD-10-CM | POA: Diagnosis not present

## 2022-05-19 DIAGNOSIS — D494 Neoplasm of unspecified behavior of bladder: Secondary | ICD-10-CM

## 2022-05-19 DIAGNOSIS — Z87440 Personal history of urinary (tract) infections: Secondary | ICD-10-CM | POA: Diagnosis not present

## 2022-05-19 DIAGNOSIS — C679 Malignant neoplasm of bladder, unspecified: Secondary | ICD-10-CM

## 2022-05-19 LAB — URINALYSIS, COMPLETE
Bilirubin, UA: NEGATIVE
Glucose, UA: NEGATIVE
Ketones, UA: NEGATIVE
Nitrite, UA: POSITIVE — AB
Protein,UA: NEGATIVE
Specific Gravity, UA: 1.015 (ref 1.005–1.030)
Urobilinogen, Ur: 0.2 mg/dL (ref 0.2–1.0)
pH, UA: 5.5 (ref 5.0–7.5)

## 2022-05-19 LAB — MICROSCOPIC EXAMINATION

## 2022-05-19 MED ORDER — SULFAMETHOXAZOLE-TRIMETHOPRIM 800-160 MG PO TABS: 1.0000 | ORAL_TABLET | Freq: Two times a day (BID) | ORAL | 0 refills | Status: DC

## 2022-05-19 MED ORDER — NITROFURANTOIN MONOHYD MACRO 100 MG PO CAPS: 100.0000 mg | ORAL_CAPSULE | Freq: Every day | ORAL | 0 refills | Status: AC

## 2022-05-19 NOTE — Patient Instructions (Signed)
Start Bactrim (sulfamethoxazole-trimethoprim) twice daily for 5 days to treat a UTI. The day after you finish it, start taking Macrobid (nitrofurantoin) once daily for UTI prevention. You can stop the prevention antibiotic after we complete your BCG treatments.

## 2022-05-19 NOTE — Progress Notes (Signed)
Patient presented to the clinic today for a scheduled BCG instillation.  Urine grossly infected on UA, culture pending.  Patient reports no irritative voiding symptoms.  Will treat for acute UTI today.  Prescription for Bactrim DS BID x5 days to be followed by Macrobid '100mg'$  daily for UTI prevention sent to pharmacy.  Patient to return to clinic next week for next scheduled BCG treatment.  We will add an additional treatment to her scheduled series to make up for today's missed instillation.  Debroah Loop, PA-C 05/19/22 2:16 PM

## 2022-05-22 ENCOUNTER — Other Ambulatory Visit: Payer: Self-pay | Admitting: Physician Assistant

## 2022-05-22 DIAGNOSIS — Z87440 Personal history of urinary (tract) infections: Secondary | ICD-10-CM

## 2022-05-22 LAB — CULTURE, URINE COMPREHENSIVE

## 2022-05-22 MED ORDER — NITROFURANTOIN MONOHYD MACRO 100 MG PO CAPS: 100.0000 mg | ORAL_CAPSULE | Freq: Two times a day (BID) | ORAL | 0 refills | Status: AC

## 2022-05-26 ENCOUNTER — Ambulatory Visit (INDEPENDENT_AMBULATORY_CARE_PROVIDER_SITE_OTHER): Payer: Medicare HMO | Admitting: Physician Assistant

## 2022-05-26 ENCOUNTER — Encounter: Payer: Self-pay | Admitting: Physician Assistant

## 2022-05-26 DIAGNOSIS — C679 Malignant neoplasm of bladder, unspecified: Secondary | ICD-10-CM

## 2022-05-26 DIAGNOSIS — D494 Neoplasm of unspecified behavior of bladder: Secondary | ICD-10-CM

## 2022-05-26 LAB — MICROSCOPIC EXAMINATION: Bacteria, UA: NONE SEEN

## 2022-05-26 LAB — URINALYSIS, COMPLETE
Bilirubin, UA: NEGATIVE
Glucose, UA: NEGATIVE
Leukocytes,UA: NEGATIVE
Nitrite, UA: NEGATIVE
Protein,UA: NEGATIVE
RBC, UA: NEGATIVE
Specific Gravity, UA: 1.025 (ref 1.005–1.030)
Urobilinogen, Ur: 0.2 mg/dL (ref 0.2–1.0)
pH, UA: 5.5 (ref 5.0–7.5)

## 2022-05-26 MED ORDER — BCG LIVE 50 MG IS SUSR
3.2400 mL | Freq: Once | INTRAVESICAL | Status: AC
  Administered 2022-05-26: 81 mg via INTRAVESICAL

## 2022-05-26 NOTE — Progress Notes (Signed)
BCG Bladder Instillation  BCG # 2 of 3  Due to Bladder Cancer patient is present today for a BCG treatment. Patient was cleaned and prepped in a sterile fashion with betadine. A 14FR catheter was inserted, urine return was noted 43m, urine was yellow in color.  524mof reconstituted BCG was instilled into the bladder. The catheter was then removed. Patient tolerated well, no complications were noted  Performed by: SaDebroah LoopPA-C and JeGaspar ColaCMA  Follow up/ Additional notes: 1 week for BCG #3 of 3

## 2022-06-01 ENCOUNTER — Ambulatory Visit (INDEPENDENT_AMBULATORY_CARE_PROVIDER_SITE_OTHER): Payer: Medicare HMO | Admitting: Family Medicine

## 2022-06-01 VITALS — BP 112/62 | HR 57 | Temp 98.9°F | Ht 62.0 in | Wt 175.2 lb

## 2022-06-01 DIAGNOSIS — Z0000 Encounter for general adult medical examination without abnormal findings: Secondary | ICD-10-CM

## 2022-06-01 DIAGNOSIS — E039 Hypothyroidism, unspecified: Secondary | ICD-10-CM

## 2022-06-01 DIAGNOSIS — Z23 Encounter for immunization: Secondary | ICD-10-CM | POA: Diagnosis not present

## 2022-06-01 DIAGNOSIS — C679 Malignant neoplasm of bladder, unspecified: Secondary | ICD-10-CM | POA: Diagnosis not present

## 2022-06-01 DIAGNOSIS — F17200 Nicotine dependence, unspecified, uncomplicated: Secondary | ICD-10-CM

## 2022-06-01 DIAGNOSIS — Z1231 Encounter for screening mammogram for malignant neoplasm of breast: Secondary | ICD-10-CM

## 2022-06-01 DIAGNOSIS — E785 Hyperlipidemia, unspecified: Secondary | ICD-10-CM | POA: Diagnosis not present

## 2022-06-01 DIAGNOSIS — J439 Emphysema, unspecified: Secondary | ICD-10-CM

## 2022-06-01 DIAGNOSIS — I2510 Atherosclerotic heart disease of native coronary artery without angina pectoris: Secondary | ICD-10-CM

## 2022-06-01 DIAGNOSIS — I10 Essential (primary) hypertension: Secondary | ICD-10-CM

## 2022-06-01 NOTE — Progress Notes (Signed)
Subjective:    Patient ID: Alexa Orozco, female    DOB: Aug 16, 1946, 76 y.o.   MRN: [id number]  Patient is a very pleasant 76 year old Caucasian female here today for complete physical exam.  Past medical history significant for coronary artery disease, bladder cancer currently undergoing intravesicular chemotherapy, smoking and COPD, hyperlipidemia, hypothyroidism.  She continues to smoke and has no plans to quit.  She is due for a flu shot, COVID booster, and Shingrix.  She received her flu shot today.  She denies any falls, memory loss, or depression.  She has her last treatment for bladder cancer tomorrow.  She is due for lung cancer screening.  She just had a colonoscopy earlier this year that showed polyps but no malignancy.  She does not require Pap smear.  She is overdue for a bone density Past Medical History:  Diagnosis Date   Allergies    Allergy    Anxiety    Asthma    Bladder cancer ([place])    CAD S/P percutaneous coronary angioplasty 10/2005   Class III Angina --> Cath: 85% -- PCI 3.31m x 129m(4.0 mm) Cypher DES to RCA; Myoview October 2014: normal stress test: LOW RISK; mild anteroapical breast attenuation   Cataract    h/o bilat repair   Claud[place] ([facility]   COPD [place]ronic obstructive pulmonary disease) [place]   Depression 11/29/2013   Emphysema of lung ([facility])    GERD (gastroesophageal reflux disease)    History of palpitations    History of tobacco abuse    Hyperlipidemia    Hypertension    well-controlled   Hypothyroidism    Osteoporosis    Raynaud disease    Past Surgical History:  Procedure Laterality Date   ABDOMINAL HYSTERECTOMY     h/o partila hysterectomy   BREAST BIOPSY  08/31/1986   COLONOSCOPY  2006   Dr. BrOlevia Perchesmultiple hyperplastic polyps   COLONOSCOPY  2001   adenomatous polyps   COLONOSCOPY WITH PROPOFOL N/A 04/24/2022   Procedure: COLONOSCOPY WITH PROPOFOL;  Surgeon: RoDaneil DolinMD;  [facility];  Service: Endoscopy;   Laterality: N/A;  1:00pm, asa 3   CORONARY ANGIOPLASTY WITH STENT PLACEMENT  10/14/2005   cypher DES (3.35m47m8mm) to high grade RCA lesion   NM MYOVIEW LTD  July 2012 of October 2014   '12: dipyridamole; Normal, low risk study; 2014 - normal stress test: LOW RISK; mild anteroapical breast attenuation   POLYPECTOMY  04/24/2022   Procedure: POLYPECTOMY;  Surgeon: RouDaneil DolinD;  [facility]: [facility];  Service: Endoscopy;;   TONSILLECTOMY  08/31/1957   TRANSTHORACIC ECHOCARDIOGRAM  05/22/2009   EF=>55%, normal LV systolic function; normal RV systolic function; mild MR/TR   TRANSURETHRAL RESECTION OF BLADDER TUMOR N/A 11/08/2020   Procedure: TRANSURETHRAL RESECTION OF BLADDER TUMOR (TURBT);  Surgeon: SniBilley CoD;  Location: [facility] ORS;  Service: Urology;  Laterality: N/A;   TRANSURETHRAL RESECTION OF BLADDER TUMOR N/A 12/13/2020   Procedure: TRANSURETHRAL RESECTION OF BLADDER TUMOR (TURBT);  Surgeon: SniBilley CoD;  Location: [facility] ORS;  Service: Urology;  Laterality: N/A;   Current Outpatient Medications on File Prior to Visit  Medication Sig Dispense Refill   acetaminophen (TYLENOL) 500 MG tablet Take 500-1,000 mg by mouth every 6 (six) hours as needed (pain).     albuterol (PROVENTIL) (2.5 MG/3ML) 0.083% nebulizer solution INHALE THE CONTENTS OF 1 VIAL (3ML) VIA NEBULIZER EVERY 6 HOURS AS NEEDED FOR WHEEZING OR SHORTNESS OF BREATH 150  mL 1   albuterol (VENTOLIN HFA) 108 (90 Base) MCG/ACT inhaler USE 2 INHALATIONS ORALLY   EVERY 6 HOURS AS NEEDED FORWHEEZING OR SHORTNESS OF   BREATH 17 g 2   aspirin EC 81 MG EC tablet Take 1 tablet (81 mg total) by mouth daily. Swallow whole. 30 tablet 11   Budeson-Glycopyrrol-Formoterol (BREZTRI AEROSPHERE) 160-9-4.8 MCG/ACT AERO Inhale 2 puffs into the lungs 2 (two) times daily. 10.7 g 11   clonazePAM (KLONOPIN) 0.5 MG tablet TAKE 1 TABLET BY MOUTH 2 TIMES DAILY AS NEEDED FOR ANXIETY. 30 tablet 0   clopidogrel (PLAVIX) 75 MG tablet Take 1  tablet (75 mg total) by mouth daily. 90 tablet 3   cromolyn (OPTICROM) 4 % ophthalmic solution Place 1 drop into both eyes 4 (four) times daily.     diclofenac Sodium (VOLTAREN) 1 % GEL Apply 2 g topically 4 (four) times daily. (Patient taking differently: Apply 2 g topically 4 (four) times daily as needed (pain).) 100 g 3   Eszopiclone 3 MG TABS TAKE 1 TABLET BY MOUTH AT BEDTIME TAKE IMMEDIATELY BEFORE BEDTIME 30 tablet 2   levothyroxine (SYNTHROID) 100 MCG tablet TAKE 1 TABLET (100 MCG TOTAL) BY MOUTH DAILY BEFORE BREAKFAST. 90 tablet 2   meclizine (ANTIVERT) 25 MG tablet Take 0.5 tablets (12.5 mg total) by mouth 3 (three) times daily as needed for dizziness. 30 tablet 0   montelukast (SINGULAIR) 10 MG tablet TAKE 1 TABLET AT BEDTIME 90 tablet 1   nitrofurantoin, macrocrystal-monohydrate, (MACROBID) 100 MG capsule Take 1 capsule (100 mg total) by mouth daily for 14 days. 14 capsule 0   nitroGLYCERIN (NITROSTAT) 0.4 MG SL tablet Place 1 tablet (0.4 mg total) under the tongue every 5 (five) minutes as needed for chest pain. 25 tablet 6   ondansetron (ZOFRAN) 4 MG tablet Take 1 tablet (4 mg total) by mouth every 8 (eight) hours as needed for nausea or vomiting. 20 tablet 0   pantoprazole (PROTONIX) 40 MG tablet Take 1 tablet (40 mg total) by mouth every morning. 90 tablet 3   pravastatin (PRAVACHOL) 20 MG tablet Take 1 tablet (20 mg total) by mouth every morning. 90 tablet 3   sertraline (ZOLOFT) 100 MG tablet Take 1.5 tablets (150 mg total) by mouth daily. 135 tablet 3   Trospium Chloride 60 MG CP24 Take 1 capsule (60 mg total) by mouth daily at 2 PM. 90 capsule 3   linaclotide (LINZESS) 145 MCG CAPS capsule Take 1 capsule (145 mcg total) by mouth daily before breakfast. (Patient not taking: Reported on 06/01/2022) 90 capsule 3   No current facility-administered medications on file prior to visit.   Allergies  Allergen Reactions   Bystolic [Nebivolol Hcl] Hives   Cephalexin Hives    Keflex*    Effexor [Venlafaxine] Other (See Comments)    Made her face hot and red   Levaquin [Levofloxacin In D5w] Other (See Comments)    Caused face to turn red and patient became extremely hot    Social History   Socioeconomic History   Marital status: Widowed    Spouse name: Not on file   Number of children: 3   Years of education: 10th    Highest education level: Not on file  Occupational History   Occupation: Retired  Tobacco Use   Smoking status: Every Day    Packs/day: 0.50    Years: 35.00    Total pack years: 17.50    Types: Cigarettes    Passive exposure: Never  Smokeless tobacco: Never  Vaping Use   Vaping Use: Never used  Substance and Sexual Activity   Alcohol use: No   Drug use: No   Sexual activity: Not on file  Other Topics Concern   Not on file  Social History Narrative   She is a widowed mother of 72, grandmother for, great-grandmother 7.  She still smokes about half pack a day.  She doesn't have about 35 years.  She does not drink.   Prior to her symptoms coming on, she used to do all kind of walking around and working in the garden and other activities with her close friend.   Social Determinants of Health   Financial Resource Strain: Low Risk  (01/23/2022)   Overall Financial Resource Strain (CARDIA)    Difficulty of Paying Living Expenses: Not very hard  Food Insecurity: No Food Insecurity (01/23/2022)   Hunger Vital Sign    Worried About Running Out of Food in the Last Year: Never true    Ran Out of Food in the Last Year: Never true  Transportation Needs: No Transportation Needs (01/23/2022)   PRAPARE - Hydrologist (Medical): No    Lack of Transportation (Non-Medical): No  Physical Activity: Insufficiently Active (01/23/2022)   Exercise Vital Sign    Days of Exercise per Week: 3 days    Minutes of Exercise per Session: 30 min  Stress: No Stress Concern Present (01/23/2022)   [place]    Feeling of Stress : Not at all  Social Connections: Moderately Integrated (01/23/2022)   Social Connection and Isolation Panel [NHANES]    Frequency of Communication with Friends and Family: More than three times a week    Frequency of Social Gatherings with Friends and Family: More than three times a week    Attends Religious Services: More than 4 times per year    Active Member of [employer] or Organizations: Yes    Attends Archivist Meetings: More than 4 times per year    Marital Status: Widowed  Intimate Partner Violence: Not At Risk (01/23/2022)   Humiliation, Afraid, Rape, and Kick questionnaire    Fear of Current or Ex-Partner: No    Emotionally Abused: No    Physically Abused: No    Sexually Abused: No      Review of Systems  Respiratory:  Positive for cough.   All other systems reviewed and are negative.      Objective:   Physical Exam Vitals reviewed.  Constitutional:      General: She is not in acute distress.    Appearance: Normal appearance. She is normal weight. She is not ill-appearing or toxic-appearing.  HENT:     Head: Normocephalic and atraumatic.     Right Ear: Tympanic membrane and ear canal normal.     Left Ear: Tympanic membrane and ear canal normal.  Cardiovascular:     Rate and Rhythm: Normal rate and regular rhythm.     Heart sounds: Normal heart sounds.  Pulmonary:     Effort: Pulmonary effort is normal.     Breath sounds: No wheezing, rhonchi or rales.  Abdominal:     General: Bowel sounds are normal.     Palpations: Abdomen is soft.     Tenderness: There is no abdominal tenderness.  Lymphadenopathy:     Cervical: No cervical adenopathy.  Neurological:     Mental Status: She is alert.   Patient  has a tender area at the inferior pole of her right parotid gland.  I do not appreciate a mass.  It is tender in that area but there is no lymphadenopathy.  She is also tender to palpation around her liver  although there is no liver enlargement.  She did have a treatment yesterday for her cancer so is possible that this could be causing some of her pelvic discomfort.        Assessment & Plan:  Encounter for screening mammogram for malignant neoplasm of breast - Plan: MM Digital Screening  Dyslipidemia - Plan: CBC with Differential/Platelet, Lipid panel, COMPLETE METABOLIC PANEL WITH GFR, TSH  Hypothyroidism, unspecified type - Plan: CBC with Differential/Platelet, Lipid panel, COMPLETE METABOLIC PANEL WITH GFR, TSH  Smoker unmotivated to quit - Plan: CT CHEST LUNG CA SCREEN LOW DOSE W/O CM  Pulmonary emphysema, unspecified emphysema type ([place])  Encounter for Medicare annual wellness exam  Essential hypertension  Malignant neoplasm of urinary bladder, unspecified site [facility])  Coronary artery disease involving native coronary artery of native heart, unspecified whether angina present I physical exam today is normal except for wheezing associated with her emphysema.  Recommended smoking cessation.  Patient received a flu shot.  I recommended Shingrix.  I recommended a COVID booster.  Schedule the patient for lung cancer screening with a CT scan.  Schedule the patient for mammogram.  Recommended a bone density which she declines.  Check a CBC, CMP, lipid panel.  Goal LDL cholesterol is less than 70.  Check a TSH to monitor the management of her hypothyroidism.  Recommended calcium 1200 mg a day and vitamin D 1000 units a day

## 2022-06-01 NOTE — Addendum Note (Signed)
Addended by: Randal Buba K on: 06/01/2022 11:05 AM   Modules accepted: Orders

## 2022-06-02 ENCOUNTER — Encounter: Payer: Self-pay | Admitting: Physician Assistant

## 2022-06-02 ENCOUNTER — Ambulatory Visit (INDEPENDENT_AMBULATORY_CARE_PROVIDER_SITE_OTHER): Payer: Medicare HMO | Admitting: Physician Assistant

## 2022-06-02 DIAGNOSIS — C679 Malignant neoplasm of bladder, unspecified: Secondary | ICD-10-CM

## 2022-06-02 DIAGNOSIS — Z87440 Personal history of urinary (tract) infections: Secondary | ICD-10-CM | POA: Diagnosis not present

## 2022-06-02 DIAGNOSIS — D494 Neoplasm of unspecified behavior of bladder: Secondary | ICD-10-CM

## 2022-06-02 LAB — COMPLETE METABOLIC PANEL WITH GFR
AG Ratio: 1.1 (calc) (ref 1.0–2.5)
ALT: 19 U/L (ref 6–29)
AST: 19 U/L (ref 10–35)
Albumin: 3.8 g/dL (ref 3.6–5.1)
Alkaline phosphatase (APISO): 105 U/L (ref 37–153)
BUN: 17 mg/dL (ref 7–25)
CO2: 24 mmol/L (ref 20–32)
Calcium: 9.3 mg/dL (ref 8.6–10.4)
Chloride: 103 mmol/L (ref 98–110)
Creat: 0.83 mg/dL (ref 0.60–1.00)
Globulin: 3.5 g/dL (calc) (ref 1.9–3.7)
Glucose, Bld: 86 mg/dL (ref 65–99)
Potassium: 4.9 mmol/L (ref 3.5–5.3)
Sodium: 138 mmol/L (ref 135–146)
Total Bilirubin: 0.3 mg/dL (ref 0.2–1.2)
Total Protein: 7.3 g/dL (ref 6.1–8.1)
eGFR: 74 mL/min/{1.73_m2} (ref >=60)

## 2022-06-02 LAB — CBC WITH DIFFERENTIAL/PLATELET
Absolute Monocytes: 827 cells/uL (ref 200–950)
Basophils Absolute: 62 cells/uL (ref 0–200)
Basophils Relative: 0.7 %
Eosinophils Absolute: 431 cells/uL (ref 15–500)
Eosinophils Relative: 4.9 %
HCT: 36.7 % (ref 35.0–45.0)
Hemoglobin: 12.2 g/dL (ref 11.7–15.5)
Lymphs Abs: 2350 cells/uL (ref 850–3900)
MCH: 29.3 pg (ref 27.0–33.0)
MCHC: 33.2 g/dL (ref 32.0–36.0)
MCV: 88.2 fL (ref 80.0–100.0)
MPV: 9.8 fL (ref 7.5–12.5)
Monocytes Relative: 9.4 %
Neutro Abs: 5130 cells/uL (ref 1500–7800)
Neutrophils Relative %: 58.3 %
Platelets: 432 10*3/uL — ABNORMAL HIGH (ref 140–400)
RBC: 4.16 10*6/uL (ref 3.80–5.10)
RDW: 13.8 % (ref 11.0–15.0)
Total Lymphocyte: 26.7 %
WBC: 8.8 10*3/uL (ref 3.8–10.8)

## 2022-06-02 LAB — URINALYSIS, COMPLETE
Bilirubin, UA: NEGATIVE
Glucose, UA: NEGATIVE
Ketones, UA: NEGATIVE
Leukocytes,UA: NEGATIVE
Nitrite, UA: NEGATIVE
Protein,UA: NEGATIVE
RBC, UA: NEGATIVE
Specific Gravity, UA: 1.015 (ref 1.005–1.030)
Urobilinogen, Ur: 0.2 mg/dL (ref 0.2–1.0)
pH, UA: 6 (ref 5.0–7.5)

## 2022-06-02 LAB — MICROSCOPIC EXAMINATION: Bacteria, UA: NONE SEEN

## 2022-06-02 LAB — LIPID PANEL
Cholesterol: 190 mg/dL (ref <200)
HDL: 69 mg/dL (ref >=50)
LDL Cholesterol (Calc): 101 mg/dL (calc) — ABNORMAL HIGH
Non-HDL Cholesterol (Calc): 121 mg/dL (calc) (ref <130)
Total CHOL/HDL Ratio: 2.8 (calc) (ref <5.0)
Triglycerides: 106 mg/dL (ref <150)

## 2022-06-02 LAB — TSH: TSH: 0.7 mIU/L (ref 0.40–4.50)

## 2022-06-02 MED ORDER — BCG LIVE 50 MG IS SUSR
3.2400 mL | Freq: Once | INTRAVESICAL | Status: AC
  Administered 2022-06-02: 81 mg via INTRAVESICAL

## 2022-06-02 NOTE — Progress Notes (Signed)
BCG Bladder Instillation  BCG # 3 of 3  Due to Bladder Cancer patient is present today for a BCG treatment. Patient was cleaned and prepped in a sterile fashion with betadine. A 14FR catheter was inserted, urine return was noted 40m, urine was yellow in color.  388mof reconstituted BCG was instilled into the bladder. The catheter was then removed. Patient tolerated well, no complications were noted  Performed by: SaDebroah LoopPA-C and   Follow up/ Additional notes: cysto end of October with Dr. SnDiamantina Providence

## 2022-06-03 ENCOUNTER — Other Ambulatory Visit: Payer: Medicare HMO | Admitting: Urology

## 2022-06-04 ENCOUNTER — Other Ambulatory Visit: Payer: Self-pay

## 2022-06-04 ENCOUNTER — Other Ambulatory Visit: Payer: Medicare HMO | Admitting: Urology

## 2022-06-04 DIAGNOSIS — E785 Hyperlipidemia, unspecified: Secondary | ICD-10-CM

## 2022-06-04 DIAGNOSIS — I10 Essential (primary) hypertension: Secondary | ICD-10-CM

## 2022-06-04 MED ORDER — PRAVASTATIN SODIUM 20 MG PO TABS: 40.0000 mg | ORAL_TABLET | ORAL | 3 refills | Status: DC

## 2022-06-08 ENCOUNTER — Other Ambulatory Visit: Payer: Self-pay | Admitting: Physician Assistant

## 2022-06-08 DIAGNOSIS — C679 Malignant neoplasm of bladder, unspecified: Secondary | ICD-10-CM

## 2022-06-08 DIAGNOSIS — N3281 Overactive bladder: Secondary | ICD-10-CM

## 2022-06-09 IMAGING — CT CT ABD-PEL WO/W CM
3 of 12 series · 12 of 46 positions shown, 18 images · IV contrast (iopamidol)
Comparison: None.

CLINICAL DATA: Gross hematuria, lower back pain and burning with
urination x6 weeks.

EXAM:
CT ABDOMEN AND PELVIS WITHOUT AND WITH CONTRAST
TECHNIQUE: Multidetector CT imaging of the abdomen and pelvis was performed
following the standard protocol before and following the bolus
administration of intravenous contrast.
CONTRAST:  125mL 4CG6TM-7LL IOPAMIDOL (4CG6TM-7LL) INJECTION 61%

[Series 2: without pre 5.00 br40 s3 · axial · non-contrast · 0.75mm/px · z∈[+1405,+1525]mm · 3 of 84 slices shown]
[im 12/84  soft-tissue]
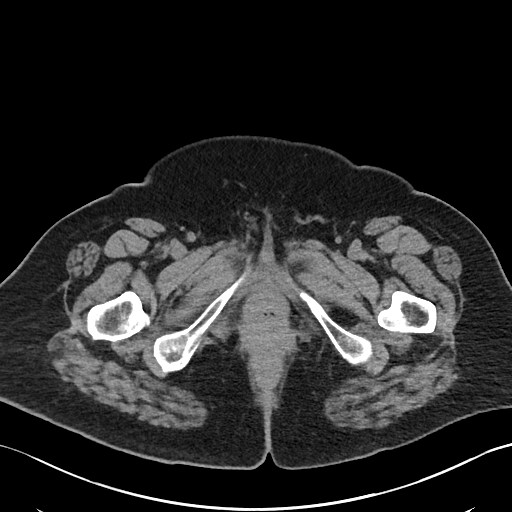
[im 24/84  soft-tissue]
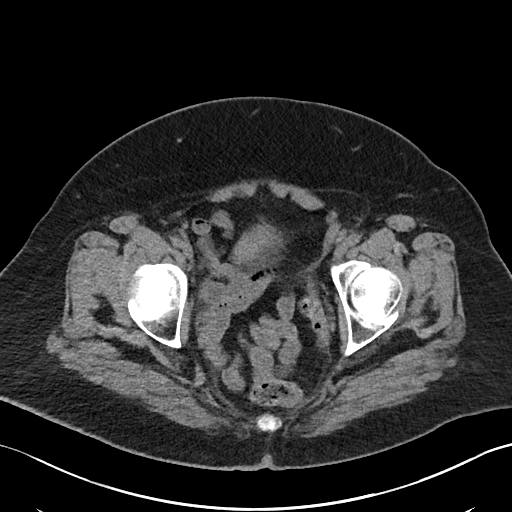
[im 36/84  soft-tissue]
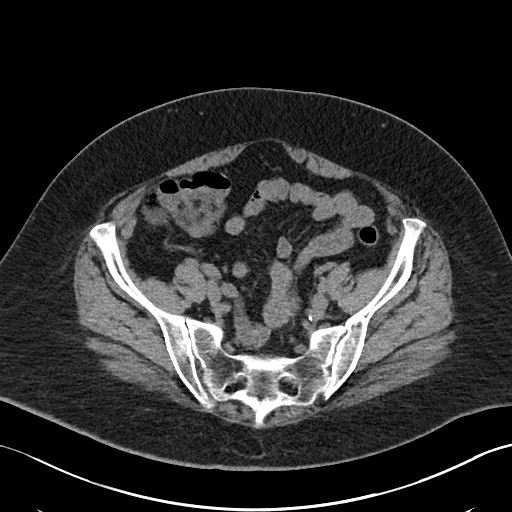

[Series 5: without pre 2.00 br40 s3 cor without · coronal · non-contrast · 0.75mm/px · 2 of 191 slices shown, 3 images]
[im 64/191  soft-tissue]
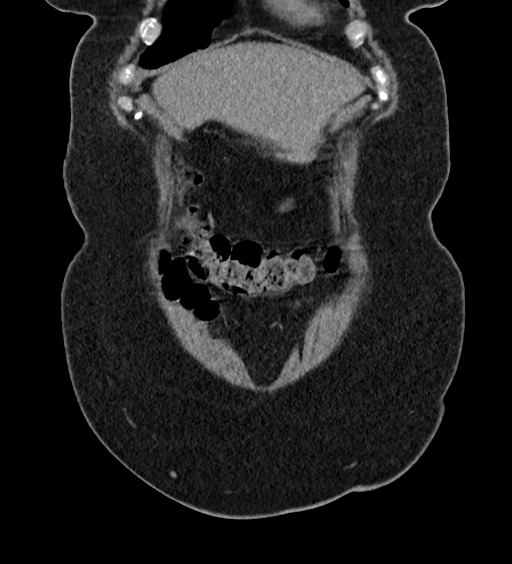
[im 64/191  bone]
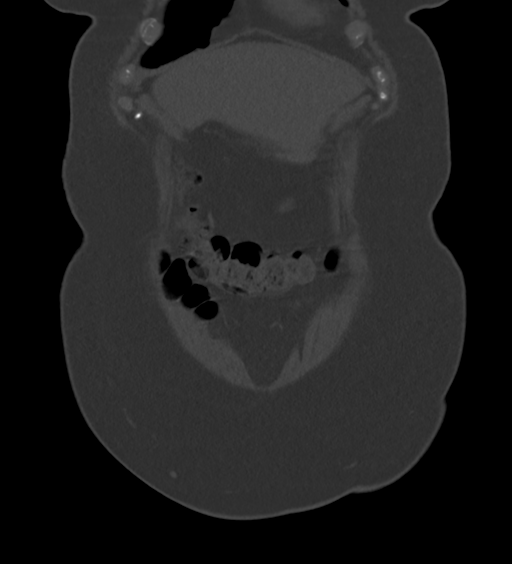
[im 127/191  soft-tissue]
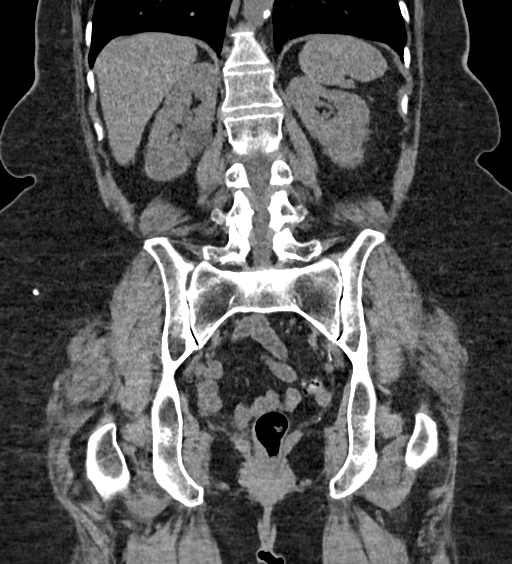

[Series 17: delay prone 5.00 br40 s3 prone axial delay · axial · delayed · 0.75mm/px · z∈[+1478,+1838]mm · 7 of 96 slices shown, 12 images]
[im 12/96  soft-tissue]
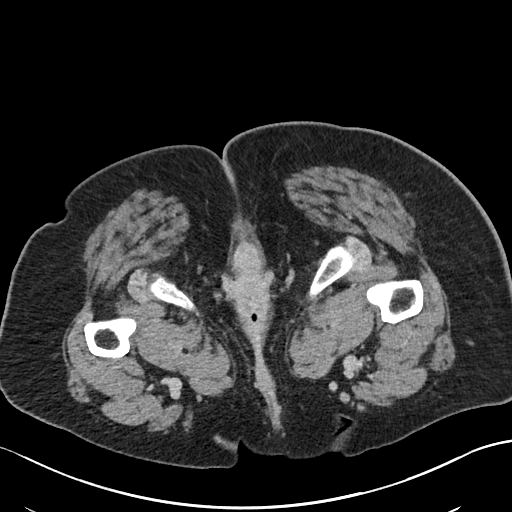
[im 12/96  bone]
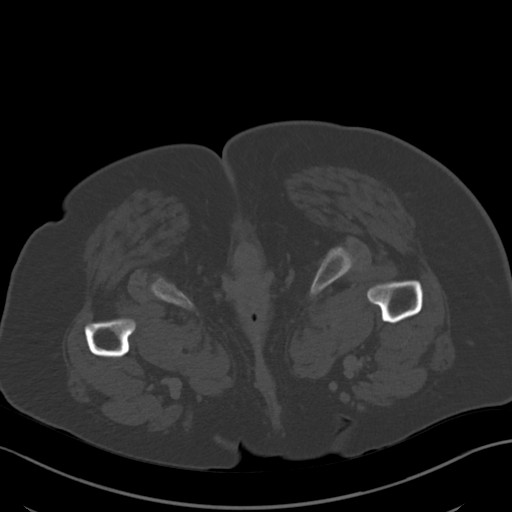
[im 24/96  soft-tissue]
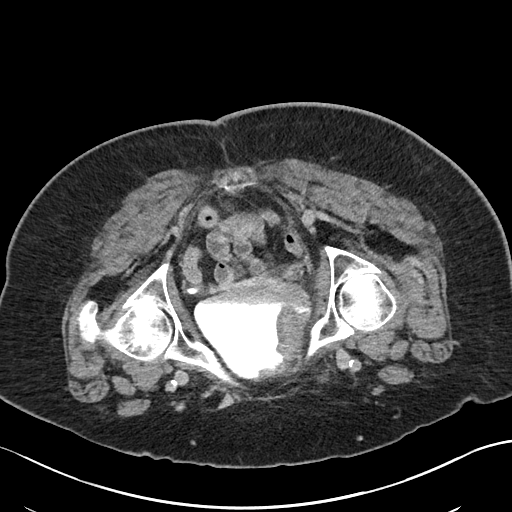
[im 36/96  soft-tissue]
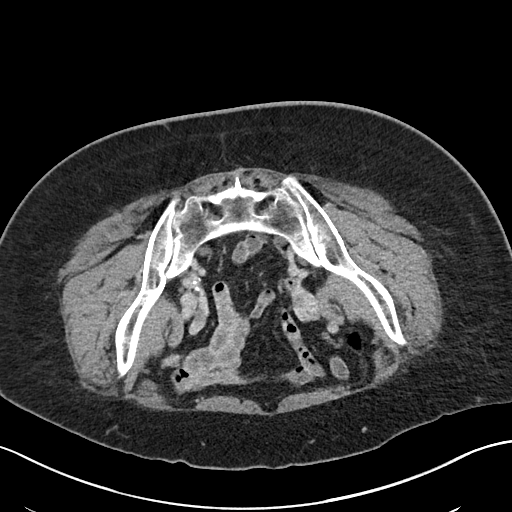
[im 48/96  soft-tissue]
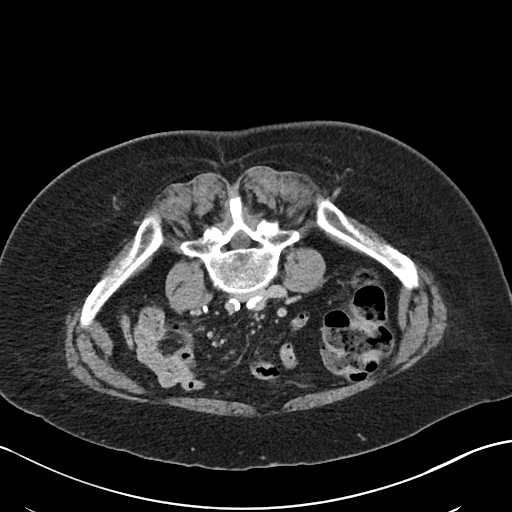
[im 48/96  lung]
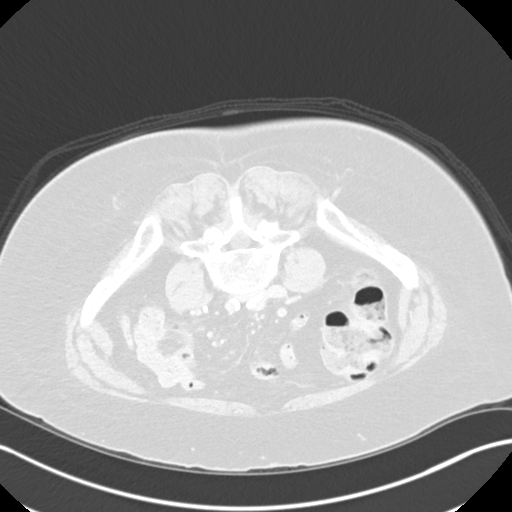
[im 60/96  soft-tissue]
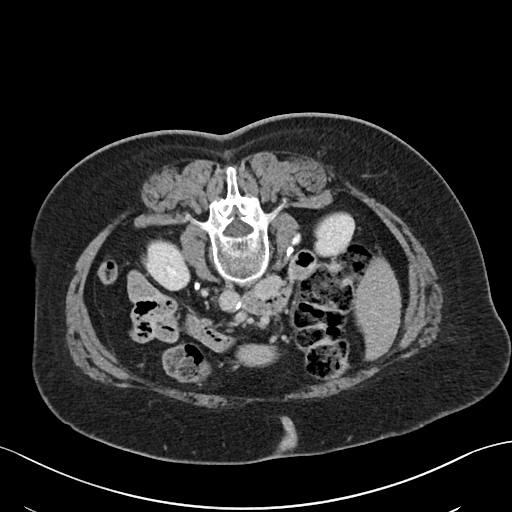
[im 60/96  lung]
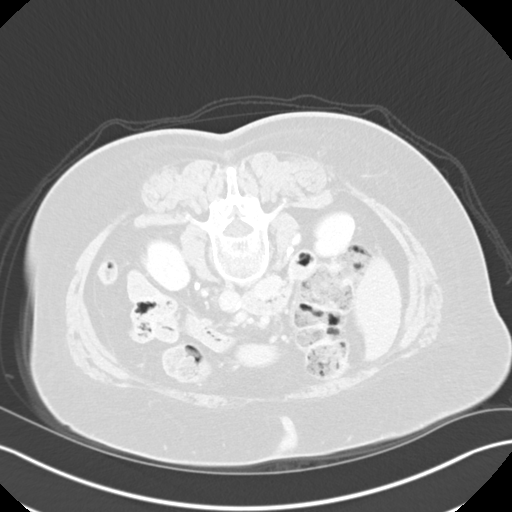
[im 72/96  soft-tissue]
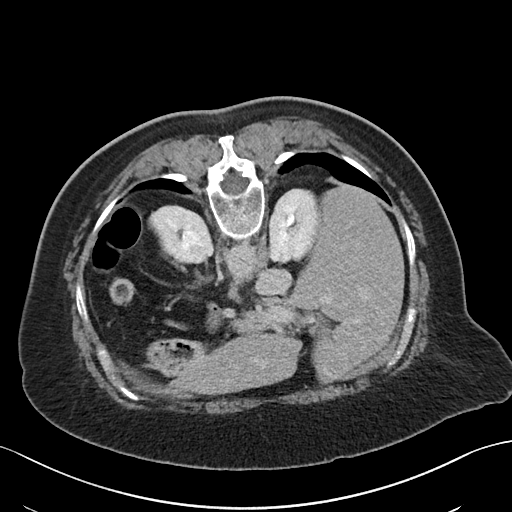
[im 72/96  lung]
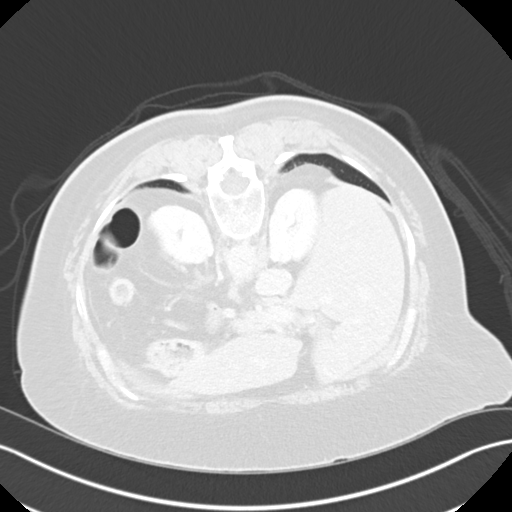
[im 84/96  soft-tissue]
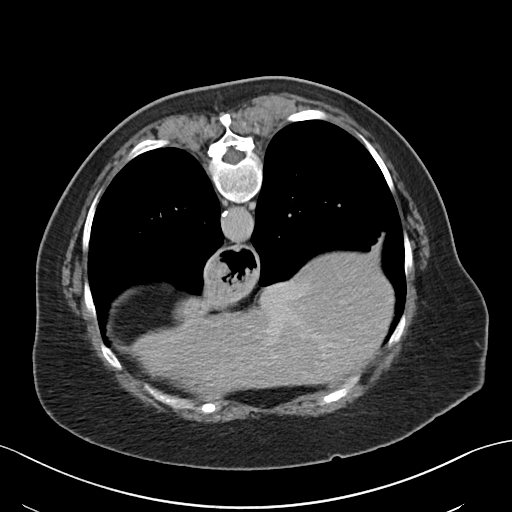
[im 84/96  lung]
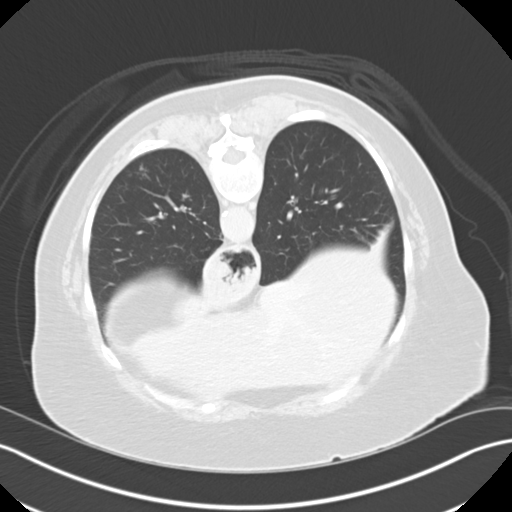

[12 of 46 positions shown; findings below may reference images not displayed]

FINDINGS: Lower chest: 6 mm left lower lobe pulmonary nodule on image [DATE].
Fat containing right Bochdalek hernia. Normal size heart. No
pericardial effusion. Moderate hiatal hernia.

Hepatobiliary: No suspicious hepatic masses. Cholelithiasis without
findings of cholecystitis. No biliary ductal dilatation.

Pancreas: Unremarkable. No pancreatic ductal dilatation or
surrounding inflammatory changes.

Spleen: Normal in size without focal abnormality.

Adrenals/Urinary Tract: Adrenal glands are unremarkable. No
nephrolithiasis. No hydronephrosis. Right-sided extrarenal pelvis.
No suspicious renal masses. Multilobulated right-sided bladder wall
mass measuring approximately 2.2 cm in maximal thickness on image
73/17, and extends posteriorly over the right UVJ. There is
suspicious soft tissue thickening extending beyond the bladder wall
on the right best seen on image 65/9 and 71/17. No visualized
filling defect in the left ureter or filling system on delayed
imaging. The mid and distal right ureter are not opacified on
delayed imaging limiting the assessment for filling defects.

Stomach/Bowel: Moderate hiatal hernia otherwise the stomach is
unremarkable. No suspicious small bowel wall thickening or dilation.
The appendix and terminal ileum appear unremarkable. Small volume
formed stool throughout the colon.

Vascular/Lymphatic: No pathologically enlarged abdominal or pelvic
lymph nodes.

Reproductive: Status post hysterectomy. No adnexal masses.

Other: No abdominopelvic ascites.

Musculoskeletal: No suspicious lytic or blastic lesions of bone. No
acute osseous abnormality. L5-S1 discogenic disease.
IMPRESSION: 1. Multilobulated large right-sided bladder wall mass which extends
posteriorly over the right UVJ with suspicious extraluminal soft
tissue thickening extending beyond the bladder wall on the right.
Findings are highly concerning for bladder malignancy.
2. 6 mm left lower lobe pulmonary nodule, which is nonspecific.
3. No pathologically enlarged abdominopelvic lymph nodes.
4. Moderate hiatal hernia.
5. Cholelithiasis without findings of cholecystitis.

These results will be called to the ordering clinician or
representative by the Radiologist Assistant, and communication
documented in the PACS or [REDACTED].

## 2022-06-16 ENCOUNTER — Other Ambulatory Visit: Payer: Self-pay | Admitting: Family Medicine

## 2022-06-16 NOTE — Telephone Encounter (Signed)
Requested Prescriptions  Pending Prescriptions Disp Refills  . diclofenac Sodium (VOLTAREN) 1 % GEL [Pharmacy Med Name: DICLOFENAC SODIUM 1% GEL] 100 g 0    Sig: Apply 2 g topically 4 (four) times daily as needed (pain).     Analgesics:  Topicals Failed - 06/16/2022  9:41 AM      Failed - Manual Review: Labs are only required if the patient has taken medication for more than 8 weeks.      Failed - PLT in normal range and within 360 days    Platelets  Date Value Ref Range Status  06/01/2022 432 (H) 140 - 400 Thousand/uL Final         Passed - HGB in normal range and within 360 days    Hemoglobin  Date Value Ref Range Status  06/01/2022 12.2 11.7 - 15.5 g/dL Final         Passed - HCT in normal range and within 360 days    HCT  Date Value Ref Range Status  06/01/2022 36.7 35.0 - 45.0 % Final         Passed - Cr in normal range and within 360 days    Creat  Date Value Ref Range Status  06/01/2022 0.83 0.60 - 1.00 mg/dL Final         Passed - eGFR is 30 or above and within 360 days    GFR, Est African American  Date Value Ref Range Status  02/06/2021 67 > OR = 60 mL/min/1.48m Final   GFR, Est Non African American  Date Value Ref Range Status  02/06/2021 58 (L) > OR = 60 mL/min/1.735mFinal   GFR, Estimated  Date Value Ref Range Status  05/14/2021 >60 >60 mL/min Final    Comment:    (NOTE) Calculated using the CKD-EPI Creatinine Equation (2021)    eGFR  Date Value Ref Range Status  06/01/2022 74 > OR = 60 mL/min/1.7369minal         Passed - Patient is not pregnant      Passed - Valid encounter within last 12 months    Recent Outpatient Visits          6 months ago Fatigue, unspecified type   [facility], [facility]   11 months ago [facility] Mcgee[facility]   1 year ago Pneumonia due to COVID-19 virus   [facility]   [facility]   [facility] BearP

## 2022-06-18 ENCOUNTER — Telehealth: Payer: Self-pay | Admitting: *Deleted

## 2022-06-18 NOTE — Telephone Encounter (Signed)
Pt calling stating that she may be having another UTI? Pt's symptom is burning with urination, I advised that it may be some residual from BCG and to push water. Pt is scheduled for 06/25/22 for Cysto.  Please advise

## 2022-06-18 NOTE — Telephone Encounter (Signed)
OK to schedule a UTI visit with me.

## 2022-06-19 ENCOUNTER — Other Ambulatory Visit: Payer: Self-pay | Admitting: Family Medicine

## 2022-06-19 DIAGNOSIS — J302 Other seasonal allergic rhinitis: Secondary | ICD-10-CM

## 2022-06-19 DIAGNOSIS — E785 Hyperlipidemia, unspecified: Secondary | ICD-10-CM

## 2022-06-19 DIAGNOSIS — F339 Major depressive disorder, recurrent, unspecified: Secondary | ICD-10-CM

## 2022-06-19 DIAGNOSIS — J441 Chronic obstructive pulmonary disease with (acute) exacerbation: Secondary | ICD-10-CM

## 2022-06-19 NOTE — Telephone Encounter (Signed)
Requested Prescriptions  Pending Prescriptions Disp Refills  . sertraline (ZOLOFT) 100 MG tablet [Pharmacy Med Name: SERTRALINE TAB '100MG'$ ] 135 tablet 3    Sig: TAKE 1 AND 1/2 TABLETS     DAILY     Psychiatry:  Antidepressants - SSRI - sertraline Failed - 06/19/2022 11:55 AM      Failed - Valid encounter within last 6 months    Recent Outpatient Visits          7 months ago Fatigue, unspecified type   [place] Pickard, Cammie Mcgee, MD   11 months ago [place] Dennard Schaumann, Cammie Mcgee, MD   1 year ago Pneumonia due to COVID-19 virus   [place] Pickard, Cammie Mcgee, MD   1 year ago COPD exacerbation [facility])   [place] Susy Frizzle, MD   1 year ago COPD exacerbation [facility])   [facility] Eulogio Bear, NP             Passed - AST in normal range and within 360 days    AST  Date Value Ref Range Status  06/01/2022 19 10 - 35 U/L Final         Passed - ALT in normal range and within 360 days    ALT  Date Value Ref Range Status  06/01/2022 19 6 - 29 U/L Final         Passed - Completed PHQ-2 or PHQ-9 in the last 360 days      . clopidogrel (PLAVIX) 75 MG tablet [Pharmacy Med Name: CLOPIDOGREL  TAB '75MG'$ ] 90 tablet 3    Sig: TAKE 1 TABLET DAILY     Hematology: Antiplatelets - clopidogrel Failed - 06/19/2022 11:55 AM      Failed - PLT in normal range and within 180 days    Platelets  Date Value Ref Range Status  06/01/2022 432 (H) 140 - 400 Thousand/uL Final         Failed - Valid encounter within last 6 months    Recent Outpatient Visits          7 months ago Fatigue, unspecified type   [place] Pickard, Cammie Mcgee, MD   11 months ago [place] Susy Frizzle, MD   1 year ago Pneumonia due to COVID-19 virus   [place] Susy Frizzle, MD   1 year ago COPD exacerbation ([place])   [place] Susy Frizzle, MD   1 year ago COPD exacerbation [facility])   [place] Eulogio Bear, NP             Passed - HCT in normal range and within 180 days    HCT  Date Value Ref Range Status  06/01/2022 36.7 35.0 - 45.0 % Final         Passed - HGB in normal range and within 180 days    Hemoglobin  Date Value Ref Range Status  06/01/2022 12.2 11.7 - 15.5 g/dL Final         Passed - Cr in normal range and within 360 days    Creat  Date Value Ref Range Status  06/01/2022 0.83 0.60 - 1.00 mg/dL Final         . montelukast (SINGULAIR) 10 MG tablet [Pharmacy Med Name: MONTELUKAST  TAB '10MG'$ ] 90 tablet 1    Sig:  TAKE 1 TABLET AT BEDTIME     Pulmonology:  Leukotriene Inhibitors Passed - 06/19/2022 11:55 AM      Passed - Valid encounter within last 12 months    Recent Outpatient Visits          7 months ago Fatigue, unspecified type   [place] Pickard, Cammie Mcgee, MD   11 months ago [place] Dennard Schaumann, Cammie Mcgee, MD   1 year ago Pneumonia due to COVID-19 virus   [place] Pickard, Cammie Mcgee, MD   1 year ago COPD exacerbation [facility])   [place] [facility] Denn[place] Schaumann, Cammie Mcgee, MD   1 year ago COPD exacerbation [facility])   [facility] Eulogio Bear, NP             . pantoprazole ([place]) 40 MG tablet [Pharmacy Med Name: PANTOPRAZOLE TAB '40MG'$  DR] 90 tablet 3    Sig: TAKE 1 TABLET EVERY MORNING     Gastroenterology: Proton Pump Inhibitors Passed - 06/19/2022 11:55 AM      Passed - Valid encounter within last 12 months    Recent Outpatient Visits          7 months ago Fatigue, unspecified type   [place] Pickard, Cammie Mcgee, MD   11 months ago [place] Dennard Schaumann, Cammie Mcgee, MD   1 year ago Pneumonia due to COVID-19 virus   [place] Pickard, Cammie Mcgee, MD   1 year ago COPD exacerbation  ([place])   [place] [facility] Dennard Schaumann, Cammie Mcgee, MD   1 year ago COPD exacerbation [facility])   [place], Jessica A, NP             . pravastatin (PRAVACHOL) 20 MG tablet [Pharmacy Med Name: PRAVASTATIN  TAB '20MG'$ ] 90 tablet 3    Sig: TAKE 1 TABLET EVERY MORNING     Cardiovascular:  Antilipid - Statins Failed - 06/19/2022 11:55 AM      Failed - Lipid Panel in normal range within the last 12 months    Cholesterol  Date Value Ref Range Status  06/01/2022 190 <200 mg/dL Final   LDL Cholesterol (Calc)  Date Value Ref Range Status  06/01/2022 101 (H) mg/dL (calc) Final    Comment:    Reference range: <100 . Desirable range <100 mg/dL for primary prevention;   <70 mg/dL for patients with CHD or diabetic patients  with > or = 2 CHD risk factors. Mar[place] Kitchen LDL-C is now calculated using the Martin-Hopkins  calculation, which is a validated novel method providing  better accuracy than the Friedewald equation in the  estimation of LDL-C.  Cresenciano Genre et al. Annamaria Helling. 0539;767(34): 2061-2068  (http://education.[employer].com/faq/FAQ164)    HDL  Date Value Ref Range Status  06/01/2022 69 > OR = 50 mg/dL Final   Triglycerides  Date Value Ref Range Status  06/01/2022 106 <150 mg/dL Final         Passed - Patient is not pregnant      Passed - Valid encounter within last 12 months    Recent Outpatient Visits          7 months ago Fatigue, unspecified type   [place] Pickard, Cammie Mcgee, MD   11 months ago [place], Warren T, MD   1 year ago Pneumonia due to COVID-19 virus  [place] Pickard, Cammie Mcgee, MD   1 year ago COPD exacerbation [facility])   [place] [facility] Dennard Schaumann, Cammie Mcgee, MD   1 year ago COPD exacerbation [facility])   [facility] Eulogio Bear, NP             . levothyroxine (SYNTHROID) 88 MCG tablet [Pharmacy Med Name: LEVOTHYROXIN  TAB 88MCG] 90 tablet 3    Sig: TAKE 1 TABLET DAILY BEFORE BREAKFAST     Endocrinology:  Hypothyroid Agents Passed - 06/19/2022 11:55 AM      Passed - TSH in normal range and within 360 days    TSH  Date Value Ref Range Status  06/01/2022 0.70 0.40 - 4.50 mIU/L Final         Passed - Valid encounter within last 12 months    Recent Outpatient Visits          7 months ago Fatigue, unspecified type   [place] Pickard, Cammie Mcgee, MD   11 months ago [place] Dennard Schaumann, Cammie Mcgee, MD   1 year ago Pneumonia due to COVID-19 virus   [place] Pickard, Cammie Mcgee, MD   1 year ago COPD exacerbation [facility])   [place] [facility] Pickard, Cammie Mcgee, MD   1 year ago COPD exacerbation [facility])   [facility] Eulogio Bear, NP

## 2022-06-19 NOTE — Telephone Encounter (Signed)
Spoke with pt and she will wait until her 06/25/22 visit with Dr. Diamantina Providence

## 2022-06-23 ENCOUNTER — Other Ambulatory Visit: Payer: Self-pay | Admitting: Family Medicine

## 2022-06-23 MED ORDER — PRAVASTATIN SODIUM 40 MG PO TABS: 40.0000 mg | ORAL_TABLET | Freq: Every day | ORAL | 3 refills | Status: DC

## 2022-06-25 ENCOUNTER — Other Ambulatory Visit: Payer: Medicare HMO | Admitting: Urology

## 2022-07-02 ENCOUNTER — Ambulatory Visit (INDEPENDENT_AMBULATORY_CARE_PROVIDER_SITE_OTHER): Payer: Medicare HMO | Admitting: Urology

## 2022-07-02 VITALS — BP 141/79 | HR 62 | Ht 62.0 in | Wt 173.0 lb

## 2022-07-02 DIAGNOSIS — R350 Frequency of micturition: Secondary | ICD-10-CM

## 2022-07-02 LAB — MICROSCOPIC EXAMINATION

## 2022-07-02 LAB — URINALYSIS, COMPLETE
Bilirubin, UA: NEGATIVE
Glucose, UA: NEGATIVE
Ketones, UA: NEGATIVE
Nitrite, UA: NEGATIVE
Protein,UA: NEGATIVE
Specific Gravity, UA: 1.015 (ref 1.005–1.030)
Urobilinogen, Ur: 0.2 mg/dL (ref 0.2–1.0)
pH, UA: 5.5 (ref 5.0–7.5)

## 2022-07-02 MED ORDER — NITROFURANTOIN MONOHYD MACRO 100 MG PO CAPS: 100.0000 mg | ORAL_CAPSULE | Freq: Two times a day (BID) | ORAL | 0 refills | Status: DC

## 2022-07-02 NOTE — Progress Notes (Signed)
Urinalysis today suspicious for infection, started nitrofurantoin 100 mg twice daily x7 days, follow-up culture, reschedule cystoscopy in 1 to 2 weeks  [name], MD 07/02/2022

## 2022-07-06 LAB — CULTURE, URINE COMPREHENSIVE

## 2022-07-09 ENCOUNTER — Encounter: Payer: Self-pay | Admitting: Urology

## 2022-07-09 ENCOUNTER — Ambulatory Visit (INDEPENDENT_AMBULATORY_CARE_PROVIDER_SITE_OTHER): Payer: Medicare HMO | Admitting: Urology

## 2022-07-09 ENCOUNTER — Other Ambulatory Visit: Payer: Medicare HMO | Admitting: Urology

## 2022-07-09 VITALS — BP 131/81 | HR 64 | Ht 62.0 in | Wt 173.0 lb

## 2022-07-09 DIAGNOSIS — R310 Gross hematuria: Secondary | ICD-10-CM

## 2022-07-09 DIAGNOSIS — C679 Malignant neoplasm of bladder, unspecified: Secondary | ICD-10-CM

## 2022-07-09 DIAGNOSIS — R350 Frequency of micturition: Secondary | ICD-10-CM | POA: Diagnosis not present

## 2022-07-09 DIAGNOSIS — D494 Neoplasm of unspecified behavior of bladder: Secondary | ICD-10-CM | POA: Diagnosis not present

## 2022-07-09 MED ORDER — GEMTESA 75 MG PO TABS: 1.0000 | ORAL_TABLET | Freq: Every day | ORAL | 11 refills | Status: DC

## 2022-07-09 NOTE — Addendum Note (Signed)
Addended by: Despina Hidden on: 07/09/2022 02:37 PM   Modules accepted: Orders

## 2022-07-09 NOTE — Progress Notes (Signed)
Bladder cancer surveillance note     UROLOGIC HISTORY Alexa Orozco is a 76 y.o. female who originally presented with gross hematuria and on CT was found to have a large 7+ cm bladder tumor at the right lateral wall, posterior wall, and anteriorly.   Initial Diagnosis of Bladder  Year: 10/2020 Pathology: High-grade T1 urothelial cell carcinoma   11/2020: Second look TURBT-pathology showing high-grade T1, muscle present and not involved     Treatments for Bladder Cancer Initial TURBT 10/2020 TURBT second look 11/2020 Induction BCG x6 completed July 2022 mBCG #1 05/2021 mBCG #2 10/2021 mBCG#3 05/2022   AUA Risk Category High   Cystoscopy Procedure Note:   After informed consent and discussion of the procedure and its risks, Cristian Grieves was positioned and prepped in the standard fashion. Cystoscopy was performed with the a flexible cystoscope. The urethra, bladder neck and entire bladder was visualized in a standard fashion.  Bladder mucosa grossly normal throughout, no tumor seen.  No abnormalities on retroflexion.  Extensive scar at right side of the bladder.  -Call with cytology results -We will try to get her back on Gemtesa for OAB, that medication was working much better than the trospium -RTC cystoscopy 3 to 4 months, if normal next BCG due May 2024 -We reviewed the AUA guidelines regarding ongoing maintenance BCG and surveillance cystoscopy every 3 to 4 months for the first 2 years   [name], MD 07/09/2022

## 2022-07-10 LAB — CYTOLOGY - NON PAP

## 2022-07-13 ENCOUNTER — Telehealth: Payer: Self-pay

## 2022-07-13 ENCOUNTER — Other Ambulatory Visit: Payer: Self-pay | Admitting: Family Medicine

## 2022-07-13 NOTE — Telephone Encounter (Signed)
-----   Message from Billey Co, MD sent at 07/10/2022  4:12 PM EST ----- Good news, no cancer cells on urine specimen.  Please schedule follow-up with me in 3 to 4 months for cystoscopy.  We were also trying to get her back on Gemtesa for her OAB, that was working much better than the trospium she is on currently, can you see if there is a way to get that authorized?  Thanks  [name], MD 07/10/2022

## 2022-07-13 NOTE — Telephone Encounter (Signed)
  Prescription Request  07/13/2022  Is this a "Controlled Substance" medicine? No  LOV: 06/01/2022   What is the name of the medication or equipment?   clopidogrel (PLAVIX) 75 MG tablet   Have you contacted your pharmacy to request a refill? Yes   Which pharmacy would you like this sent to?  [id number] [place] [address] [address]Phone: [phone]: [phone]  Patient notified that their request is being sent to the clinical staff for review and that they should receive a response within 2 business days.   Please advise pharmacist at [phone] **Requesting refill or new script**

## 2022-07-13 NOTE — Telephone Encounter (Signed)
Called pt informed her of the information below. Pt gave verbal understanding, appointment was previously scheduled.

## 2022-07-14 NOTE — Telephone Encounter (Signed)
Unable to refill per protocol, Rx request is too soon. Last refill 06/19/22 for 90 and 3 RF. Will refuse.  Requested Prescriptions  Pending Prescriptions Disp Refills   clopidogrel (PLAVIX) 75 MG tablet 90 tablet 3    Sig: Take 1 tablet (75 mg total) by mouth daily.     Hematology: Antiplatelets - clopidogrel Failed - 07/13/2022 10:31 AM      Failed - PLT in normal range and within 180 days    Platelets  Date Value Ref Range Status  06/01/2022 432 (H) 140 - 400 Thousand/uL Final         Failed - Valid encounter within last 6 months    Recent Outpatient Visits           7 months ago Fatigue, unspecified type   [place] Pickard, Cammie Mcgee, MD   1 year ago [place] Susy Frizzle, MD   1 year ago Pneumonia due to COVID-19 virus   [place] Susy Frizzle, MD   1 year ago COPD exacerbation [facility])   [place] Susy Frizzle, MD   1 year ago COPD exacerbation [facility])   [place] Eulogio Bear, NP              Passed - HCT in normal range and within 180 days    HCT  Date Value Ref Range Status  06/01/2022 36.7 35.0 - 45.0 % Final         Passed - HGB in normal range and within 180 days    Hemoglobin  Date Value Ref Range Status  06/01/2022 12.2 11.7 - 15.5 g/dL Final         Passed - Cr in normal range and within 360 days    Creat  Date Value Ref Range Status  06/01/2022 0.83 0.60 - 1.00 mg/dL Final

## 2022-07-16 ENCOUNTER — Ambulatory Visit (HOSPITAL_COMMUNITY)
Admission: RE | Admit: 2022-07-16 | Discharge: 2022-07-16 | Disposition: A | Discharge status: Home / Self Care | Payer: Medicare HMO | Source: Ambulatory Visit | Attending: Family Medicine | Admitting: Family Medicine

## 2022-07-16 DIAGNOSIS — K449 Diaphragmatic hernia without obstruction or gangrene: Secondary | ICD-10-CM | POA: Insufficient documentation

## 2022-07-16 DIAGNOSIS — F17210 Nicotine dependence, cigarettes, uncomplicated: Secondary | ICD-10-CM | POA: Insufficient documentation

## 2022-07-16 DIAGNOSIS — I700 Atherosclerosis of aorta: Secondary | ICD-10-CM | POA: Diagnosis not present

## 2022-07-16 DIAGNOSIS — F17200 Nicotine dependence, unspecified, uncomplicated: Secondary | ICD-10-CM

## 2022-07-16 DIAGNOSIS — I2510 Atherosclerotic heart disease of native coronary artery without angina pectoris: Secondary | ICD-10-CM | POA: Insufficient documentation

## 2022-07-16 DIAGNOSIS — Z122 Encounter for screening for malignant neoplasm of respiratory organs: Secondary | ICD-10-CM | POA: Insufficient documentation

## 2022-07-16 DIAGNOSIS — J439 Emphysema, unspecified: Secondary | ICD-10-CM | POA: Diagnosis not present

## 2022-07-16 DIAGNOSIS — K8020 Calculus of gallbladder without cholecystitis without obstruction: Secondary | ICD-10-CM | POA: Diagnosis not present

## 2022-07-16 DIAGNOSIS — R911 Solitary pulmonary nodule: Secondary | ICD-10-CM | POA: Diagnosis not present

## 2022-07-16 NOTE — Progress Notes (Signed)
Chronic Care Management Pharmacy Note  07/30/2022 Name:  Alexa Orozco MRN:  [id number] DOB:  11/16/1945  Summary: PharmD FU visit.  Taking her increased statin dose and tolerating well.  Rarely using her rescue inhaler and no recent SOB.  Still gets Breztri through PAP program.  Should re-enroll automatically for 2024.  Recommendations/Changes made from today's visit: Recheck lipids at next visit - if elevated would recommend switch to Atorvastatin 31m.  Plan: FU 6 months   Subjective: Alexa Demiccois an 76y.o. year old female who is a primary patient of Pickard, WCammie Mcgee MD.  The CCM team was consulted for assistance with disease management and care coordination needs.    Engaged with patient by telephone for follow up visit in response to provider referral for pharmacy case management and/or care coordination services.   Consent to Services:  The patient was given the following information about Chronic Care Management services today, agreed to services, and gave verbal consent: 1. CCM service includes personalized support from designated clinical staff supervised by the primary care provider, including individualized plan of care and coordination with other care providers 2. 24/7 contact phone numbers for assistance for urgent and routine care needs. 3. Service will only be billed when office clinical staff spend 20 minutes or more in a month to coordinate care. 4. Only one practitioner may furnish and bill the service in a calendar month. 5.The patient may stop CCM services at any time (effective at the end of the month) by phone call to the office staff. 6. The patient will be responsible for cost sharing (co-pay) of up to 20% of the service fee (after annual deductible is met). Patient agreed to services and consent obtained.  Patient Care Team: PSusy Frizzle MD as PCP - General (Family Medicine) HLeonie Man MD as PCP - Cardiology (Cardiology) DEdythe Clarity [facility] (Pharmacist)  Recent office visits: 11/21/2021 (Dennard Schaumann - patient is having extreme fatigue patient wants treated between 1-2.  Objective:  Lab Results  Component Value Date   CREATININE 0.83 06/01/2022   BUN 17 06/01/2022   EGFR 74 06/01/2022   GFRNONAA >60 05/14/2021   GFRAA 67 02/06/2021   NA 138 06/01/2022   K 4.9 06/01/2022   CALCIUM 9.3 06/01/2022   CO2 24 06/01/2022   GLUCOSE 86 06/01/2022    No results found for: "HGBA1C", "FRUCTOSAMINE", "GFR", "MICROALBUR"  Last diabetic Eye exam: No results found for: "HMDIABEYEEXA"  Last diabetic Foot exam: No results found for: "HMDIABFOOTEX"   Lab Results  Component Value Date   CHOL 190 06/01/2022   HDL 69 06/01/2022   LDLCALC 101 (H) 06/01/2022   TRIG 106 06/01/2022   CHOLHDL 2.8 06/01/2022       Latest Ref Rng & Units 06/01/2022   10:48 AM 11/21/2021    4:06 PM 05/22/2021   12:45 PM  Hepatic Function  Total Protein 6.1 - 8.1 g/dL 7.3  6.7  6.7   AST 10 - 35 U/L _0 ALT 6 - 29 U/L 19  20  34   Total Bilirubin 0.2 - 1.2 mg/dL 0.3  0.3  0.4     Lab Results  Component Value Date/Time   TSH 0.70 06/01/2022 10:48 AM   TSH 0.45 01/22/2022 12:45 PM   FREET4 1.0 11/21/2021 04:06 PM       Latest Ref Rng & Units 06/01/2022   10:48 AM 11/21/2021    4:06 PM 05/27/2021  11:21 AM  CBC  WBC 3.8 - 10.8 Thousand/uL 8.8  8.0  13.5   Hemoglobin 11.7 - 15.5 g/dL 12.2  13.0  11.8   Hematocrit 35.0 - 45.0 % 36.7  38.7  35.4   Platelets 140 - 400 Thousand/uL 432  328  300     Lab Results  Component Value Date/Time   VD25OH 19 (L) 02/08/2019 09:09 AM    Clinical ASCVD: Yes  The ASCVD Risk score (Arnett DK, et al., 2019) failed to calculate for the following reasons:   The patient has a prior MI or stroke diagnosis       06/01/2022   10:17 AM 01/23/2022   10:05 AM 01/02/2021   12:15 PM  Depression screen PHQ 2/9  Decreased Interest 1 0 1  Down, Depressed, Hopeless 1 0 1  PHQ - 2 Score 2 0 2  Altered sleeping  0  3  Tired, decreased energy 3  3  Change in appetite 3  0  Feeling bad or failure about yourself  0  1  Trouble concentrating 0  0  Moving slowly or fidgety/restless 0  0  Suicidal thoughts 0  0  PHQ-9 Score 8  9  Difficult doing work/chores Somewhat difficult  Not difficult at all      Social History   Tobacco Use  Smoking Status Every Day   Packs/day: 0.50   Years: 35.00   Total pack years: 17.50   Types: Cigarettes   Passive exposure: Never  Smokeless Tobacco Never   BP Readings from Last 3 Encounters:  07/09/22 131/81  07/02/22 (!) 141/79  06/01/22 112/62   Pulse Readings from Last 3 Encounters:  07/09/22 64  07/02/22 62  06/01/22 (!) 57   Wt Readings from Last 3 Encounters:  07/09/22 173 lb (78.5 kg)  07/02/22 173 lb (78.5 kg)  06/01/22 175 lb 3.2 oz (79.5 kg)   BMI Readings from Last 3 Encounters:  07/09/22 31.64 kg/m  07/02/22 31.64 kg/m  06/01/22 32.04 kg/m    Assessment/Interventions: Review of patient past medical history, allergies, medications, health status, including review of consultants reports, laboratory and other test data, was performed as part of comprehensive evaluation and provision of chronic care management services.   SDOH:  (Social Determinants of Health) assessments and interventions performed: No, done within the year SDOH Interventions    Flowsheet Row Clinical Support from 01/23/2022 in [place] from 01/02/2021 in [place] Interventions    Food Insecurity Interventions Intervention Not Indicated Intervention Not Indicated  Housing Interventions Intervention Not Indicated Intervention Not Indicated  Transportation Interventions Intervention Not Indicated Intervention Not Indicated  Depression Interventions/Treatment  -- Currently on Treatment  Financial Strain Interventions Intervention Not Indicated Intervention Not Indicated  Physical Activity Interventions  Intervention Not Indicated Other (Comments)  [Patient states that she will try to walk daily]  Stress Interventions Intervention Not Indicated Intervention Not Indicated  Social Connections Interventions Intervention Not Indicated Intervention Not Indicated      Financial Resource Strain: Low Risk  (01/23/2022)   Overall Financial Resource Strain (CARDIA)    Difficulty of Paying Living Expenses: Not very hard   Food Insecurity: No Food Insecurity (01/23/2022)   Hunger Vital Sign    Worried About Running Out of Food in the Last Year: Never true    Ran Out of Food in the Last Year: Never true    [place]: No Food Insecurity (01/23/2022)  Housing: Low Risk  (01/23/2022)  Transportation Needs: No Transportation Needs (01/23/2022)  Alcohol Screen: Low Risk  (01/23/2022)  Depression (PHQ2-9): Medium Risk (06/01/2022)  Financial Resource Strain: Low Risk  (01/23/2022)  Physical Activity: Insufficiently Active (01/23/2022)  Social Connections: Moderately Integrated (01/23/2022)  Stress: No Stress Concern Present (01/23/2022)  Tobacco Use: High Risk (07/09/2022)    CCM Care Plan  Allergies  Allergen Reactions   Bystolic [Nebivolol Hcl] Hives   Cephalexin Hives    Keflex*   Effexor [Venlafaxine] Other (See Comments)    Made her face hot and red   Levaquin [Levofloxacin In D5w] Other (See Comments)    Caused face to turn red and patient became extremely hot     Medications Reviewed Today     Reviewed by Edythe Clarity, [facility] (Pharmacist) on 07/30/22 at [place] List Status: <None>   Medication Order Taking? Sig Documenting Provider Last Dose Status Informant  acetaminophen (TYLENOL) 500 MG tablet [id number] Yes Take 500-1,000 mg by mouth every 6 (six) hours as needed (pain). [provider] Taking Active Self           Med Note Wilmon Pali, MELISSA R   Fri Apr 17, 2022 11:43 AM)    albuterol (PROVENTIL) (2.5 MG/3ML) 0.083% nebulizer solution [id number] Yes  INHALE THE CONTENTS OF 1 VIAL (3ML) VIA NEBULIZER EVERY 6 HOURS AS NEEDED FOR WHEEZING OR SHORTNESS OF Montel Clock, MD Taking Active Self  albuterol (VENTOLIN HFA) 108 (90 Base) MCG/ACT inhaler [id number] Yes USE 2 INHALATIONS ORALLY   EVERY 6 HOURS AS NEEDED FORWHEEZING OR SHORTNESS OF   Montel Clock, MD Taking Active Self  aspirin EC 81 MG EC tablet [id number] Yes Take 1 tablet (81 mg total) by mouth daily. Swallow whole. Barb Merino, MD Taking Active Self  Budeson-Glycopyrrol-Formoterol (BREZTRI AEROSPHERE) 160-9-4.8 MCG/ACT Hollie Salk [id number] Yes Inhale 2 puffs into the lungs 2 (two) times daily. Susy Frizzle, MD Taking Active Self  clonazePAM (KLONOPIN) 0.5 MG tablet [id number] Yes TAKE 1 TABLET BY MOUTH 2 TIMES DAILY AS NEEDED FOR ANXIETY. Susy Frizzle, MD Taking Active   clopidogrel (PLAVIX) 75 MG tablet [id number] Yes TAKE 1 TABLET DAILY Susy Frizzle, MD Taking Active   cromolyn (OPTICROM) 4 % ophthalmic solution [id number] Yes Place 1 drop into both eyes 4 (four) times daily. [provider] Taking Active Self           Med Note Tamala Julian, Forest Gleason   Mon Jun 22, 2016  4:56 AM)    diclofenac Sodium (VOLTAREN) 1 % GEL [id number] Yes APPLY 2 G TOPICALLY 4 (FOUR) TIMES DAILY AS NEEDED (PAIN). Susy Frizzle, MD Taking Active   Eszopiclone 3 MG TABS [id number] Yes TAKE 1 TABLET BY MOUTH AT BEDTIME TAKE IMMEDIATELY BEFORE BEDTIME Susy Frizzle, MD Taking Active   levothyroxine (SYNTHROID) 100 MCG tablet [id number] Yes TAKE 1 TABLET (100 MCG TOTAL) BY MOUTH DAILY BEFORE BREAKFAST. Susy Frizzle, MD Taking Active Self  linaclotide Rolan Lipa) 145 MCG CAPS capsule [id number] Yes Take 1 capsule (145 mcg total) by mouth daily before breakfast. Susy Frizzle, MD Taking Active   meclizine (ANTIVERT) 25 MG tablet [id number] Yes Take 0.5 tablets (12.5 mg total) by mouth 3 (three) times daily as needed for dizziness. Susy Frizzle, MD Taking Active Self            Med Note Wilmon Pali, MELISSA R   Fri Apr 17, 2022 11:43 AM)    montelukast (SINGULAIR) 10  MG tablet [id number] Yes TAKE 1 TABLET AT BEDTIME Susy Frizzle, MD Taking Active   nitrofurantoin, macrocrystal-monohydrate, (MACROBID) 100 MG capsule [id number] Yes Take 1 capsule (100 mg total) by mouth every 12 (twelve) hours. Billey Co, MD Taking Active   nitroGLYCERIN (NITROSTAT) 0.4 MG SL tablet [id number] Yes Place 1 tablet (0.4 mg total) under the tongue every 5 (five) minutes as needed for chest pain. Orlena Sheldon, PA-C Taking Active Self  ondansetron (ZOFRAN) 4 MG tablet [id number] Yes Take 1 tablet (4 mg total) by mouth every 8 (eight) hours as needed for nausea or vomiting. Susy Frizzle, MD Taking Active   pantoprazole (PROTONIX) 40 MG tablet [id number] Yes TAKE 1 TABLET EVERY MORNING Susy Frizzle, MD Taking Active   pravastatin (PRAVACHOL) 40 MG tablet [id number] Yes Take 1 tablet (40 mg total) by mouth daily. Susy Frizzle, MD Taking Active   sertraline (ZOLOFT) 100 MG tablet [id number] Yes TAKE 1 AND 1/2 TABLETS     DAILY Susy Frizzle, MD Taking Active   Trospium Chloride 60 MG CP24 [id number] Yes TAKE 1 CAPSULE DAILY AT Yvone Neu, MD Taking Active   Vibegron [facility]) 75 MG TABS [id number] Yes Take 1 tablet by mouth daily. Billey Co, MD Taking Active             Patient Active Problem List   Diagnosis Date Noted   Positive colorectal cancer screening using Cologuard test 03/26/2022   Constipation 03/26/2022   Primary osteoarthritis of both first carpometacarpal joints 08/18/2021   Right hand pain 08/18/2021   NSTEMI (non-ST elevated myocardial infarction) ([place]) 05/12/2021   Bladder cancer ([place]) 05/12/2021   COVID-19 virus infection 05/12/2021   Acute on chronic respiratory failure with hypoxia ([place]) 05/12/2021   Insomnia 09/19/2014   Generalized anxiety disorder 09/19/2014   Depression 11/29/2013   Osteoporosis    Obesity (BMI 30-39.9)  05/31/2013   Stable angina 05/31/2013   Claudication of left lower extremity ([place]) 05/31/2013   CAD S/P percutaneous coronary angioplasty    Dyslipidemia    Hirsutism 03/03/2011   Essential hypertension    Hypothyroid    Raynaud's disease    COPD (chronic obstructive pulmonary disease) ([place])    Tobacco abuse     Immunization History  Administered Date(s) Administered   Fluad Quad(high Dose 65+) 05/16/2021   Influenza, High Dose Seasonal PF 05/24/2014, 05/11/2019, 06/01/2022   Influenza,inj,Quad PF,6+ Mos 06/20/2015   Pneumococcal Conjugate-13 11/29/2013   Pneumococcal Polysaccharide-23 12/23/2011   Tdap 12/23/2011   Zoster, Live 01/17/2013    Conditions to be addressed/monitored:  HTN, CAD, COPD, Hypothyroidism, Dyslipidemia, Depression, Insomnia  Care Plan : General Pharmacy (Adult)  Updates made by Edythe Clarity, RPH since 07/30/2022 12:00 AM     Problem: HTN, CAD, COPD, Hypothyroidism, Dyslipidemia, Depression, Insomnia   Priority: High  Onset Date: 01/15/2022     Long-Range Goal: Patient-Specific Goal   Start Date: 01/15/2022  Expected End Date: 07/18/2022  Recent Progress: On track  Priority: High  Note:   Current Barriers:  Elevated LDL  Pharmacist Clinical Goal(s):  Patient will achieve improvement in breathing as evidenced by symptoms through collaboration with PharmD and provider.   Interventions: 1:1 collaboration with Susy Frizzle, MD regarding development and update of comprehensive plan of care as evidenced by provider attestation and co-signature Inter-disciplinary care team collaboration (see longitudinal plan of care) Comprehensive medication review performed; medication list updated in electronic medical record  Hypertension (BP goal <130/80) -Controlled,  not assessed -Current treatment: None -Medications previously tried: hydralazine  -Current home readings: unknown -Denies hypotensive/hypertensive symptoms -Educated on BP goals  and benefits of medications for prevention of heart attack, stroke and kidney damage; Daily salt intake goal < 2300 mg; Importance of home blood pressure monitoring; Symptoms of hypotension and importance of maintaining adequate hydration; -Counseled to monitor BP at home a few times per week, document, and provide log at future appointments -Recommended to continue current medication  Hyperlipidemia: (LDL goal < 70) 07/30/22 -Uncontrolled, most recent lipid panel LDL was 101 -Current treatment: Pravastatin 50m daily Appropriate, Query effective, ,  -Medications previously tried: none noted  -Educated on Cholesterol goals;  Benefits of statin for ASCVD risk reduction; Importance of limiting foods high in cholesterol; -Recommended to continue current medication -Patient with previous MI, on low intensity statin. -She did get a lipid panel which was elevated at 101 above her goal < 70. She did start her new dose of statin medication - tolerating it well. Recommend recheck lipids at 6 weeks to determine efficacy. No changes at this time - if continues to be elevated would recommend switch to Atorvastatin 45m  COPD (Goal: control symptoms and prevent exacerbations) 07/30/22 -Controlled -Current treatment  Albuterol HFA 9047mAppropriate, Effective, Safe, Accessible Breztri 160-9-4.8mc26m puffs twice daily Appropriate, Effective, Safe, Accessible -Medications previously tried: Trelegy($)  -Pulmonary function testing: Pulmonary Functions Testing Results: No results found for: FEV1, FVC, FEV1FVC, TLC, DLCO -Exacerbations requiring treatment in last 6 months: none -Patient reports consistent use of maintenance inhaler -Frequency of rescue inhaler use: a few times per day Still getting her Breztri from PAP program.  She will need renewal this year.   Reports using correctly and no recent SOB episodes. COPD well controlled at this time. No recent exacerbations.  Depression/Anxiety  (Goal: Rreduce symptoms) -Controlled, not assessed -Current treatment: Sertraline 150mg68mly Appropriate, Effective, Safe, Accessible Clonazepam 0.5mg A89mopriate, Effective, Safe, Accessible -Medications previously tried/failed: none noted -PHQ9:     01/02/2021   12:15 PM 10/03/2020   12:26 PM 03/09/2018    2:33 PM  PHQ9 SCORE ONLY  PHQ-9 Total Score 9 0 18   -GAD7:      View : No data to display.           -Educated on Benefits of medication for symptom control -Recommended to continue current medication She reports her living situation has changed and this has improved her mental state.  She does need a refill on clonazepam and uses those prn.   Thyroid (Goal TSH: 1-3 Controlled, not assesed -Current treatment: Levothyroxine 100mcg 11mopriate, Effective, Safe, Accessible -Counseled to take medication on an empty stomach -Recommended to continue current medication It has been about 7 weeks since dose change.  Reminded her to call and schedule lab follow up.  She still feels tired all of the time.  Suspect some of this could be due to COPD and SOB. She takes medication at correct time.   Patient Goals/Self-Care Activities Patient will:  - take medications as prescribed as evidenced by patient report and record review focus on medication adherence by pill box collaborate with provider on medication access solutions  Follow Up Plan: The care management team will reach out to the patient again over the next 180 days.               Medication Assistance: Application for Breztri  medication assistance program. in process.  Anticipated assistance start date today.  See plan of care for additional  detail.  Compliance/Adherence/Medication fill history: Care Gaps: mammogram  Star-Rating Drugs: Pravastatin 4m 06/23/22 90ds  Patient's [facility] pharmacy [facility]  [id number] [place]: [phone]: [phone] [place] Registered Caremark Sites [place]: [phone]: [phone] pill box? Yes Pt endorses 100% compliance  We discussed: Benefits of medication synchronization, packaging and delivery as well as enhanced pharmacist oversight with [employer]. Patient decided to: Continue current medication management strategy  Care Plan and Follow Up Patient Decision:  Patient agrees to Care Plan and Follow-up.  Plan: The care management team will reach out to the patient again over the next 90 days.  ChBeverly MilchPharmD, CPP Clinical Pharmacist Practitioner [phone]

## 2022-07-17 ENCOUNTER — Other Ambulatory Visit: Payer: Self-pay | Admitting: Physician Assistant

## 2022-07-17 ENCOUNTER — Telehealth: Payer: Self-pay | Admitting: *Deleted

## 2022-07-17 DIAGNOSIS — C679 Malignant neoplasm of bladder, unspecified: Secondary | ICD-10-CM

## 2022-07-17 DIAGNOSIS — N3281 Overactive bladder: Secondary | ICD-10-CM

## 2022-07-17 NOTE — Telephone Encounter (Signed)
Pt was calling to see if we got the [place] approved and per pt's insurance the medication is covered, it's the co-pay that is so much, [facility] $204.30 for a 30 day supply.  Spoke with patient and advised cost and she will stay on trospium .

## 2022-07-17 NOTE — Telephone Encounter (Signed)
90 day supply is more, pt will stay with trospium.

## 2022-07-20 ENCOUNTER — Other Ambulatory Visit: Payer: Self-pay | Admitting: Family Medicine

## 2022-07-20 DIAGNOSIS — R918 Other nonspecific abnormal finding of lung field: Secondary | ICD-10-CM

## 2022-07-21 ENCOUNTER — Encounter: Payer: Self-pay | Admitting: Urology

## 2022-07-27 ENCOUNTER — Ambulatory Visit (HOSPITAL_COMMUNITY)
Admission: RE | Admit: 2022-07-27 | Discharge: 2022-07-27 | Disposition: A | Discharge status: Home / Self Care | Payer: Medicare HMO | Source: Ambulatory Visit | Attending: Family Medicine | Admitting: Family Medicine

## 2022-07-27 DIAGNOSIS — Z1231 Encounter for screening mammogram for malignant neoplasm of breast: Secondary | ICD-10-CM | POA: Insufficient documentation

## 2022-07-30 ENCOUNTER — Ambulatory Visit: Payer: Medicare HMO | Admitting: Pharmacist

## 2022-07-30 DIAGNOSIS — I10 Essential (primary) hypertension: Secondary | ICD-10-CM

## 2022-07-30 DIAGNOSIS — J439 Emphysema, unspecified: Secondary | ICD-10-CM

## 2022-07-30 NOTE — Patient Instructions (Addendum)
Visit Information   Goals Addressed             This Visit's Progress    Track and Manage My Symptoms-COPD   On track    Timeframe:  Long-Range Goal Priority:  High Start Date:   01/15/22                          Expected End Date:  07/18/22                      Follow Up Date 04/17/22    Approved for triple therapy inhaler - Judithann Sauger   Why is this important?   Tracking your symptoms and other information about your health helps your doctor plan your care.  Write down the symptoms, the time of day, what you were doing and what medicine you are taking.  You will soon learn how to manage your symptoms.     Notes:        Patient Care Plan: General Pharmacy (Adult)     Problem Identified: HTN, CAD, COPD, Hypothyroidism, Dyslipidemia, Depression, Insomnia   Priority: High  Onset Date: 01/15/2022     Long-Range Goal: Patient-Specific Goal   Start Date: 01/15/2022  Expected End Date: 07/18/2022  Recent Progress: On track  Priority: High  Note:   Current Barriers:  Elevated LDL  Pharmacist Clinical Goal(s):  Patient will achieve improvement in breathing as evidenced by symptoms through collaboration with PharmD and provider.   Interventions: 1:1 collaboration with Susy Frizzle, MD regarding development and update of comprehensive plan of care as evidenced by provider attestation and co-signature Inter-disciplinary care team collaboration (see longitudinal plan of care) Comprehensive medication review performed; medication list updated in electronic medical record  Hypertension (BP goal <130/80) -Controlled, not assessed -Current treatment: None -Medications previously tried: hydralazine  -Current home readings: unknown -Denies hypotensive/hypertensive symptoms -Educated on BP goals and benefits of medications for prevention of heart attack, stroke and kidney damage; Daily salt intake goal < 2300 mg; Importance of home blood pressure monitoring; Symptoms of  hypotension and importance of maintaining adequate hydration; -Counseled to monitor BP at home a few times per week, document, and provide log at future appointments -Recommended to continue current medication  Hyperlipidemia: (LDL goal < 70) 07/30/22 -Uncontrolled, most recent lipid panel LDL was 101 -Current treatment: Pravastatin '40mg'$  daily Appropriate, Query effective, ,  -Medications previously tried: none noted  -Educated on Cholesterol goals;  Benefits of statin for ASCVD risk reduction; Importance of limiting foods high in cholesterol; -Recommended to continue current medication -Patient with previous MI, on low intensity statin. -She did get a lipid panel which was elevated at 101 above her goal < 70. She did start her new dose of statin medication - tolerating it well. Recommend recheck lipids at 6 weeks to determine efficacy. No changes at this time - if continues to be elevated would recommend switch to Atorvastatin '40mg'$ .  COPD (Goal: control symptoms and prevent exacerbations) 07/30/22 -Controlled -Current treatment  Albuterol HFA 69mg Appropriate, Effective, Safe, Accessible Breztri 160-9-4.8105m 2 puffs twice daily Appropriate, Effective, Safe, Accessible -Medications previously tried: Trelegy($)  -Pulmonary function testing: Pulmonary Functions Testing Results: No results found for: FEV1, FVC, FEV1FVC, TLC, DLCO -Exacerbations requiring treatment in last 6 months: none -Patient reports consistent use of maintenance inhaler -Frequency of rescue inhaler use: a few times per day Still getting her Breztri from PAP program.  She will need renewal this year.  Reports using correctly and no recent SOB episodes. COPD well controlled at this time. No recent exacerbations.  Depression/Anxiety (Goal: Rreduce symptoms) -Controlled, not assessed -Current treatment: Sertraline '150mg'$  daily Appropriate, Effective, Safe, Accessible Clonazepam 0.'5mg'$  Appropriate, Effective, Safe,  Accessible -Medications previously tried/failed: none noted -PHQ9:     01/02/2021   12:15 PM 10/03/2020   12:26 PM 03/09/2018    2:33 PM  PHQ9 SCORE ONLY  PHQ-9 Total Score 9 0 18   -GAD7:      View : No data to display.           -Educated on Benefits of medication for symptom control -Recommended to continue current medication She reports her living situation has changed and this has improved her mental state.  She does need a refill on clonazepam and uses those prn.   Thyroid (Goal TSH: 1-3 Controlled, not assesed -Current treatment: Levothyroxine 155mg Appropriate, Effective, Safe, Accessible -Counseled to take medication on an empty stomach -Recommended to continue current medication It has been about 7 weeks since dose change.  Reminded her to call and schedule lab follow up.  She still feels tired all of the time.  Suspect some of this could be due to COPD and SOB. She takes medication at correct time.   Patient Goals/Self-Care Activities Patient will:  - take medications as prescribed as evidenced by patient report and record review focus on medication adherence by pill box collaborate with provider on medication access solutions  Follow Up Plan: The care management team will reach out to the patient again over the next 180 days.             The patient verbalized understanding of instructions, educational materials, and care plan provided today and DECLINED offer to receive copy of patient instructions, educational materials, and care plan.  Telephone follow up appointment with pharmacy team member scheduled for: 6 months  CEdythe Clarity RGreens [place]ing PharmD, CScottvilleClinical Pharmacist Practitioner BBri[place]cliffe [place]([phone]

## 2022-08-11 ENCOUNTER — Other Ambulatory Visit: Payer: Self-pay | Admitting: Family Medicine

## 2022-08-11 NOTE — Telephone Encounter (Signed)
Requested medication (s) are due for refill today - yes  Requested medication (s) are on the active medication list -yes  Future visit scheduled -no  Last refill: 05/11/22 #30 2RF  Notes to clinic: non delegated Rx  Requested Prescriptions  Pending Prescriptions Disp Refills   Eszopiclone 3 MG TABS [Pharmacy Med Name: ESZOPICLONE 3 MG TABLET] 30 tablet 2    Sig: TAKE 1 TABLET BY MOUTH AT BEDTIME TAKE IMMEDIATELY BEFORE BEDTIME     Not Delegated - Psychiatry:  Anxiolytics/Hypnotics Failed - 08/11/2022  9:19 AM      Failed - This refill cannot be delegated      Failed - Urine Drug Screen completed in last 360 days      Failed - Valid encounter within last 6 months    Recent Outpatient Visits           8 months ago Fatigue, unspecified type   [place] Pickard, Cammie Mcgee, MD   1 year ago [place] Susy Frizzle, MD   1 year ago Pneumonia due to COVID-19 virus   [place] Pickard, Cammie Mcgee, MD   1 year ago COPD exacerbation [facility])   [place] [facility] Dennard Schaumann, Cammie Mcgee, MD   1 year ago COPD exacerbation [facility])   [place] Eulogio Bear, NP                 Requested Prescriptions  Pending Prescriptions Disp Refills   Eszopiclone 3 MG TABS [Pharmacy Med Name: ESZOPICLONE 3 MG TABLET] 30 tablet 2    Sig: TAKE 1 TABLET BY MOUTH AT BEDTIME TAKE IMMEDIATELY BEFORE BEDTIME     Not Delegated - Psychiatry:  Anxiolytics/Hypnotics Failed - 08/11/2022  9:19 AM      Failed - This refill cannot be delegated      Failed - Urine Drug Screen completed in last 360 days      Failed - Valid encounter within last 6 months    Recent Outpatient Visits           8 months ago Fatigue, unspecified type   [place] Susy Frizzle, MD   1 year ago [place] Susy Frizzle, MD   1 year ago Pneumonia due to COVID-19 virus   [place] Pickard, Cammie Mcgee, MD   1 year ago COPD exacerbation [facility])   [place] Pickard, Cammie Mcgee, MD   1 year ago COPD exacerbation [facility])   [place] Eulogio Bear, NP

## 2022-08-13 ENCOUNTER — Other Ambulatory Visit: Payer: Self-pay | Admitting: Family Medicine

## 2022-08-13 NOTE — Telephone Encounter (Signed)
Requested Prescriptions  Pending Prescriptions Disp Refills   clopidogrel (PLAVIX) 75 MG tablet 90 tablet 3    Sig: Take 1 tablet (75 mg total) by mouth daily.     Hematology: Antiplatelets - clopidogrel Failed - 08/13/2022  9:20 AM      Failed - PLT in normal range and within 180 days    Platelets  Date Value Ref Range Status  06/01/2022 432 (H) 140 - 400 Thousand/uL Final         Failed - Valid encounter within last 6 months    Recent Outpatient Visits           8 months ago Fatigue, unspecified type   [place] Pickard, Cammie Mcgee, MD   1 year ago [place] Susy Frizzle, MD   1 year ago Pneumonia due to COVID-19 virus   [place] Susy Frizzle, MD   1 year ago COPD exacerbation [facility])   [place] Susy Frizzle, MD   1 year ago COPD exacerbation [facility])   [place] Eulogio Bear, NP              Passed - HCT in normal range and within 180 days    HCT  Date Value Ref Range Status  06/01/2022 36.7 35.0 - 45.0 % Final         Passed - HGB in normal range and within 180 days    Hemoglobin  Date Value Ref Range Status  06/01/2022 12.2 11.7 - 15.5 g/dL Final         Passed - Cr in normal range and within 360 days    Creat  Date Value Ref Range Status  06/01/2022 0.83 0.60 - 1.00 mg/dL Final

## 2022-08-13 NOTE — Telephone Encounter (Signed)
[facility] [place]  Prescription Request  08/13/2022  Is this a "Controlled Substance" medicine? No  LOV: 06/01/2022  What is the name of the medication or equipment? Clopidogrel 75 mg 90 with 3 refills requested  Have you contacted your pharmacy to request a refill? Yes   Which pharmacy would you like this sent to?  [id number] [place] [address] [address]Phone: [phone]: [phone] [facility] [address] [address] [place]Phone: [phone]: [phone]   Patient notified that their request is being sent to the clinical staff for review and that they should receive a response within 2 business days.   Please advise at [phone]

## 2022-08-14 ENCOUNTER — Other Ambulatory Visit: Payer: Self-pay | Admitting: *Deleted

## 2022-08-14 NOTE — Telephone Encounter (Signed)
  Media Information  Document Information  AMB Outside Consult Note  REFILL  08/14/2022 08:42  Attached To:  [place], Provider, MD

## 2022-08-14 NOTE — Telephone Encounter (Signed)
Refilled 06/19/2022 #90 3 rf - confirmed by pharmacy. Requested Prescriptions  Pending Prescriptions Disp Refills   clopidogrel (PLAVIX) 75 MG tablet 90 tablet 3    Sig: Take 1 tablet (75 mg total) by mouth daily.     Hematology: Antiplatelets - clopidogrel Failed - 08/14/2022  3:11 PM      Failed - PLT in normal range and within 180 days    Platelets  Date Value Ref Range Status  06/01/2022 432 (H) 140 - 400 Thousand/uL Final         Failed - Valid encounter within last 6 months    Recent Outpatient Visits           8 months ago Fatigue, unspecified type   [place] Pickard, Cammie Mcgee, MD   1 year ago [place] Susy Frizzle, MD   1 year ago Pneumonia due to COVID-19 virus   [place] Susy Frizzle, MD   1 year ago COPD exacerbation [facility])   [place] Susy Frizzle, MD   1 year ago COPD exacerbation [facility])   [place] Eulogio Bear, NP              Passed - HCT in normal range and within 180 days    HCT  Date Value Ref Range Status  06/01/2022 36.7 35.0 - 45.0 % Final         Passed - HGB in normal range and within 180 days    Hemoglobin  Date Value Ref Range Status  06/01/2022 12.2 11.7 - 15.5 g/dL Final         Passed - Cr in normal range and within 360 days    Creat  Date Value Ref Range Status  06/01/2022 0.83 0.60 - 1.00 mg/dL Final

## 2022-08-19 ENCOUNTER — Telehealth: Payer: Self-pay

## 2022-08-19 NOTE — Telephone Encounter (Signed)
Patient calling complaining of dysuria.  Left message advising patient she needs to be evaluated in order to get an antibiotic.

## 2022-08-28 ENCOUNTER — Other Ambulatory Visit: Payer: Self-pay

## 2022-08-28 MED ORDER — CLOPIDOGREL BISULFATE 75 MG PO TABS: 75.0000 mg | ORAL_TABLET | Freq: Every day | ORAL | 3 refills | Status: DC

## 2022-10-07 ENCOUNTER — Other Ambulatory Visit: Payer: Medicare HMO | Admitting: Urology

## 2022-10-08 ENCOUNTER — Other Ambulatory Visit: Payer: Medicare HMO | Admitting: Urology

## 2022-10-28 ENCOUNTER — Encounter: Payer: Self-pay | Admitting: Urology

## 2022-10-28 ENCOUNTER — Ambulatory Visit (INDEPENDENT_AMBULATORY_CARE_PROVIDER_SITE_OTHER): Payer: Medicare HMO | Admitting: Urology

## 2022-10-28 VITALS — BP 136/76 | HR 66 | Ht 62.0 in | Wt 171.0 lb

## 2022-10-28 DIAGNOSIS — Z8551 Personal history of malignant neoplasm of bladder: Secondary | ICD-10-CM | POA: Diagnosis not present

## 2022-10-28 MED ORDER — LIDOCAINE HCL URETHRAL/MUCOSAL 2 % EX GEL
1.0000 | Freq: Once | CUTANEOUS | Status: AC
  Administered 2022-10-28: 1 via URETHRAL

## 2022-10-28 MED ORDER — NITROFURANTOIN MONOHYD MACRO 100 MG PO CAPS
100.0000 mg | ORAL_CAPSULE | Freq: Once | ORAL | Status: AC
  Administered 2022-10-28: 100 mg via ORAL

## 2022-10-28 NOTE — Progress Notes (Signed)
Bladder cancer surveillance note     UROLOGIC HISTORY Alexa Orozco is a 77 y.o. female who originally presented with gross hematuria and on CT was found to have a large 7+ cm bladder tumor at the right lateral wall, posterior wall, and anteriorly.   Initial Diagnosis of Bladder  Year: 10/2020 Pathology: High-grade T1 urothelial cell carcinoma   11/2020: Second look TURBT-pathology showing high-grade T1, muscle present and not involved     Treatments for Bladder Cancer Initial TURBT 10/2020 TURBT second look 11/2020 Induction BCG x6 completed July 2022 mBCG #1 05/2021 mBCG #2 10/2021 mBCG#3 05/2022   AUA Risk Category High   Cystoscopy Procedure Note:   After informed consent and discussion of the procedure and its risks, Alexa Orozco was positioned and prepped in the standard fashion. Cystoscopy was performed with the a flexible cystoscope. The urethra, bladder neck and entire bladder was visualized in a standard fashion.  Bladder mucosa grossly normal throughout, no tumor seen.  No abnormalities on retroflexion.  Extensive scar at right side of the bladder.  She continues to smoke.  Smoking cessation was discussed again, and we reviewed this is the highest risk factor for bladder cancer recurrence.  Continues on trospium with only moderate improvement in her OAB symptoms, has not been able to get on the beta 3 agonist secondary to insurance issues, but did well on Gemtesa previously.  -Follow-up cytology, if atypical/negative schedule maintenance BCG for May 2024, followed by cystoscopy in June 2024 -CT urogram ordered for routine surveillance with history of bladder cancer, call with results   [name], MD 10/28/2022

## 2022-10-30 LAB — CYTOLOGY - NON PAP

## 2022-11-02 ENCOUNTER — Other Ambulatory Visit: Payer: Self-pay

## 2022-11-02 ENCOUNTER — Telehealth: Payer: Self-pay

## 2022-11-02 DIAGNOSIS — Z8551 Personal history of malignant neoplasm of bladder: Secondary | ICD-10-CM

## 2022-11-02 NOTE — Telephone Encounter (Signed)
-----   Message from Billey Co, MD sent at 10/30/2022  5:30 PM EST ----- Good news, no cancer cells on cytology.  Please coordinate maintenance BCG x 3 in May 2024 followed by cystoscopy end of June.  Also needs a CT hematuria ordered for routine surveillance, thanks  [name], MD 10/30/2022

## 2022-11-02 NOTE — Telephone Encounter (Signed)
Called pt informed her of the information below. Pt voiced understanding. CT ordered. Cysto scheduled.

## 2022-11-09 ENCOUNTER — Other Ambulatory Visit: Payer: Self-pay | Admitting: Family Medicine

## 2022-12-16 ENCOUNTER — Telehealth: Payer: Self-pay

## 2022-12-16 MED ORDER — DICLOFENAC SODIUM 1 % EX GEL: 2.0000 g | Freq: Four times a day (QID) | CUTANEOUS | 0 refills | Status: DC | PRN

## 2022-12-16 NOTE — Addendum Note (Signed)
Addended by: Arta Silence on: 12/16/2022 02:05 PM   Modules accepted: Orders

## 2022-12-16 NOTE — Telephone Encounter (Signed)
Prescription Request  12/16/2022  LOV: 06/01/22  What is the name of the medication or equipment? diclofenac Sodium (VOLTAREN) 1 % GEL [[id number]]   Have you contacted your pharmacy to request a refill? Yes   Which pharmacy would you like this sent to?  [id number] [place] [facility] [address] AT [facility] [address] [place] [address] Phone: [phone] Fax: [phone]    Patient notified that their request is being sent to the clinical staff for review and that they should receive a response within 2 business days.   Please advise at [facility] [phone]

## 2022-12-21 ENCOUNTER — Ambulatory Visit (HOSPITAL_COMMUNITY)
Admission: RE | Admit: 2022-12-21 | Discharge: 2022-12-21 | Disposition: A | Discharge status: Home / Self Care | Payer: Medicare HMO | Source: Ambulatory Visit | Attending: Urology | Admitting: Urology

## 2022-12-21 DIAGNOSIS — Z8551 Personal history of malignant neoplasm of bladder: Secondary | ICD-10-CM | POA: Diagnosis present

## 2022-12-21 LAB — POCT I-STAT CREATININE: Creatinine, Ser: 0.9 mg/dL (ref 0.44–1.00)

## 2022-12-21 MED ORDER — IOHEXOL 300 MG/ML  SOLN: 125.0000 mL | Freq: Once | INTRAMUSCULAR | Status: DC | PRN

## 2022-12-23 ENCOUNTER — Telehealth: Payer: Self-pay

## 2022-12-23 NOTE — Telephone Encounter (Signed)
-----   Message from Sondra Come, MD sent at 12/23/2022 12:21 PM EDT ----- Randie Heinz news, no abnormal findings on CT, continue BCG and surveillance cystoscopy as scheduled  Legrand Rams, MD 12/23/2022

## 2022-12-23 NOTE — Telephone Encounter (Signed)
Called pt no answer. LM informing pt of normal results.  

## 2022-12-28 ENCOUNTER — Other Ambulatory Visit: Payer: Self-pay | Admitting: Family Medicine

## 2022-12-29 NOTE — Telephone Encounter (Signed)
Requested medication (s) are due for refill today: Yes  Requested medication (s) are on the active medication list: Yes  Last refill:  05/06/23  Future visit scheduled: No  Notes to clinic:  See request.    Requested Prescriptions  Pending Prescriptions Disp Refills   clonazePAM (KLONOPIN) 0.5 MG tablet [Pharmacy Med Name: CLONAZEPAM 0.5 MG TABLET] 30 tablet     Sig: TAKE 1 TABLET BY MOUTH TWICE A DAY AS NEEDED FOR ANXIETY     Not Delegated - Psychiatry: Anxiolytics/Hypnotics 2 Failed - 12/28/2022  9:41 AM      Failed - This refill cannot be delegated      Failed - Urine Drug Screen completed in last 360 days      Failed - Valid encounter within last 6 months    Recent Outpatient Visits           1 year ago Fatigue, unspecified type   [facility] Donita Brooks, MD   1 year ago Dysuria   [facility] Donita Brooks, MD   1 year ago Pneumonia due to COVID-19 virus   [facility] Pickard, Priscille Heidelberg, MD   1 year ago COPD exacerbation [facility])   [facility] Tanya Nones, Priscille Heidelberg, MD   1 year ago COPD exacerbation [facility])   [facility] Valentino Nose, NP       Future Appointments             In 2 weeks McGowan, Wellington Hampshire, PA-C [facility] [place]   In 3 weeks McGowan, Wellington Hampshire, PA-C [facility] [place]   In 4 weeks McGowan, Elana Alm [facility] - Patient is not pregnant

## 2022-12-30 ENCOUNTER — Other Ambulatory Visit: Payer: Self-pay

## 2022-12-30 DIAGNOSIS — J439 Emphysema, unspecified: Secondary | ICD-10-CM

## 2022-12-30 MED ORDER — ALBUTEROL SULFATE HFA 108 (90 BASE) MCG/ACT IN AERS
INHALATION_SPRAY | RESPIRATORY_TRACT | 1 refills | Status: DC
Start: 2022-12-30 — End: 2023-01-11

## 2023-01-08 ENCOUNTER — Encounter: Payer: Self-pay | Admitting: Family Medicine

## 2023-01-08 ENCOUNTER — Ambulatory Visit (INDEPENDENT_AMBULATORY_CARE_PROVIDER_SITE_OTHER): Payer: Medicare HMO | Admitting: Family Medicine

## 2023-01-08 VITALS — BP 130/70 | HR 62 | Temp 98.4°F | Ht 62.0 in | Wt 167.0 lb

## 2023-01-08 DIAGNOSIS — I209 Angina pectoris, unspecified: Secondary | ICD-10-CM | POA: Diagnosis not present

## 2023-01-08 DIAGNOSIS — R5383 Other fatigue: Secondary | ICD-10-CM | POA: Diagnosis not present

## 2023-01-08 DIAGNOSIS — J441 Chronic obstructive pulmonary disease with (acute) exacerbation: Secondary | ICD-10-CM

## 2023-01-08 LAB — CBC WITH DIFFERENTIAL/PLATELET
Basophils Absolute: 41 cells/uL (ref 0–200)
Eosinophils Absolute: 288 cells/uL (ref 15–500)
Hemoglobin: 11.3 g/dL — ABNORMAL LOW (ref 11.7–15.5)
Platelets: 358 10*3/uL (ref 140–400)
RDW: 14.5 % (ref 11.0–15.0)
WBC: 10.3 10*3/uL (ref 3.8–10.8)

## 2023-01-08 MED ORDER — AZITHROMYCIN 250 MG PO TABS
ORAL_TABLET | ORAL | 0 refills | Status: DC
Start: 2023-01-08 — End: 2023-01-13

## 2023-01-08 MED ORDER — TRELEGY ELLIPTA 100-62.5-25 MCG/ACT IN AEPB
1.0000 | INHALATION_SPRAY | Freq: Every day | RESPIRATORY_TRACT | 11 refills | Status: DC
Start: 2023-01-08 — End: 2024-03-29

## 2023-01-08 MED ORDER — NITROGLYCERIN 0.4 MG SL SUBL
0.4000 mg | SUBLINGUAL_TABLET | SUBLINGUAL | 6 refills | Status: AC | PRN
Start: 2023-01-08 — End: ?

## 2023-01-08 MED ORDER — PREDNISONE 20 MG PO TABS
ORAL_TABLET | ORAL | 0 refills | Status: DC
Start: 2023-01-08 — End: 2023-02-18

## 2023-01-08 NOTE — Progress Notes (Signed)
Subjective:    Patient ID: Alexa Orozco, female    DOB: 11/19/1945, 77 y.o.   MRN: [id number]  HPI Patient has 2 big concerns.  First she has had a cough for more than 2 weeks.  Cough is now productive of yellowish-brown sputum.  She feels short of breath.  She feels tightness in her chest and congestion.  She has a history of COPD and she also continues to smoke.  She also has a history of allergies.  She is using Spiriva daily.  She also reports fatigue.  She states that she will start to walk around and her legs become extremely tired and weak.  She will have to lie down to rest in order to get her strength back.  She denies any chest pain or angina.  She has chronic shortness of breath.  She denies any claudication.  She has palpable dorsalis pedis and posterior tibialis pulses bilaterally.  She denies any neuropathy numbness or burning or any pain in her legs.  She states the fatigue will start of her legs and then wash over her entire body.  She denies any fevers or chills Past Medical History:  Diagnosis Date   Allergies    Allergy    Anxiety    Asthma    Bladder cancer (HCC)    CAD S/P percutaneous coronary angioplasty 10/2005   Class III Angina --> Cath: 85% -- PCI 3.29mm x 18mm (4.0 mm) Cypher DES to RCA; Myoview October 2014: normal stress test: LOW RISK; mild anteroapical breast attenuation   Cataract    h/o bilat repair   Claudication (HCC) 06/01/2013   COPD (chronic obstructive pulmonary disease) (HCC)    Depression 11/29/2013   Emphysema of lung (HCC)    GERD (gastroesophageal reflux disease)    History of palpitations    History of tobacco abuse    Hyperlipidemia    Hypertension    well-controlled   Hypothyroidism    Osteoporosis    Raynaud disease    Past Surgical History:  Procedure Laterality Date   ABDOMINAL HYSTERECTOMY     h/o partila hysterectomy   BREAST BIOPSY  08/31/1986   COLONOSCOPY  2006   Dr. Juanda Chance: multiple hyperplastic polyps   COLONOSCOPY  2001    adenomatous polyps   COLONOSCOPY WITH PROPOFOL N/A 04/24/2022   Procedure: COLONOSCOPY WITH PROPOFOL;  Surgeon: Corbin Ade, MD;  [facility];  Service: Endoscopy;  Laterality: N/A;  1:00pm, asa 3   CORONARY ANGIOPLASTY WITH STENT PLACEMENT  10/14/2005   cypher DES (3.64mmx18mm) to high grade RCA lesion   NM MYOVIEW LTD  July 2012 of October 2014   '12: dipyridamole; Normal, low risk study; 2014 - normal stress test: LOW RISK; mild anteroapical breast attenuation   POLYPECTOMY  04/24/2022   Procedure: POLYPECTOMY;  Surgeon: Corbin Ade, MD;  [facility];  Service: Endoscopy;;   TONSILLECTOMY  08/31/1957   TRANSTHORACIC ECHOCARDIOGRAM  05/22/2009   EF=>55%, normal LV systolic function; normal RV systolic function; mild MR/TR   TRANSURETHRAL RESECTION OF BLADDER TUMOR N/A 11/08/2020   Procedure: TRANSURETHRAL RESECTION OF BLADDER TUMOR (TURBT);  Surgeon: Sondra Come, MD;  Location: [facility];  Service: Urology;  Laterality: N/A;   TRANSURETHRAL RESECTION OF BLADDER TUMOR N/A 12/13/2020   Procedure: TRANSURETHRAL RESECTION OF BLADDER TUMOR (TURBT);  Surgeon: Sondra Come, MD;  Location: [facility] ORS;  Service: Urology;  Laterality: N/A;   Current Outpatient Medications on File Prior to Visit  Medication Sig Dispense Refill   acetaminophen (TYLENOL) 500 MG tablet Take 500-1,000 mg by mouth every 6 (six) hours as needed (pain).     albuterol (VENTOLIN HFA) 108 (90 Base) MCG/ACT inhaler USE 2 INHALATIONS ORALLY   EVERY 6 HOURS AS NEEDED FORWHEEZING OR SHORTNESS OF   BREATH 18 g 1   aspirin EC 81 MG EC tablet Take 1 tablet (81 mg total) by mouth daily. Swallow whole. 30 tablet 11   Budeson-Glycopyrrol-Formoterol (BREZTRI AEROSPHERE) 160-9-4.8 MCG/ACT AERO Inhale 2 puffs into the lungs 2 (two) times daily. 10.7 g 11   clonazePAM (KLONOPIN) 0.5 MG tablet TAKE 1 TABLET BY MOUTH TWICE A DAY AS NEEDED FOR ANXIETY 30 tablet 1   clopidogrel (PLAVIX) 75 MG tablet Take 1  tablet (75 mg total) by mouth daily. 90 tablet 3   cromolyn (OPTICROM) 4 % ophthalmic solution Place 1 drop into both eyes 4 (four) times daily.     diclofenac Sodium (VOLTAREN) 1 % GEL Apply 2 g topically 4 (four) times daily as needed (pain). 100 g 0   enalapril (VASOTEC) 2.5 MG tablet Take 2.5 mg by mouth daily.     Eszopiclone 3 MG TABS TAKE 1 TABLET BY MOUTH AT BEDTIME TAKE IMMEDIATELY BEFORE BEDTIME 30 tablet 2   levothyroxine (SYNTHROID) 100 MCG tablet TAKE 1 TABLET (100 MCG TOTAL) BY MOUTH DAILY BEFORE BREAKFAST. 90 tablet 2   linaclotide (LINZESS) 145 MCG CAPS capsule Take 1 capsule (145 mcg total) by mouth daily before breakfast. 90 capsule 3   meclizine (ANTIVERT) 25 MG tablet Take 0.5 tablets (12.5 mg total) by mouth 3 (three) times daily as needed for dizziness. 30 tablet 0   montelukast (SINGULAIR) 10 MG tablet TAKE 1 TABLET AT BEDTIME 90 tablet 3   nitroGLYCERIN (NITROSTAT) 0.4 MG SL tablet Place 1 tablet (0.4 mg total) under the tongue every 5 (five) minutes as needed for chest pain. 25 tablet 6   ondansetron (ZOFRAN) 4 MG tablet Take 1 tablet (4 mg total) by mouth every 8 (eight) hours as needed for nausea or vomiting. 20 tablet 0   pantoprazole (PROTONIX) 40 MG tablet TAKE 1 TABLET EVERY MORNING 90 tablet 3   pravastatin (PRAVACHOL) 40 MG tablet Take 1 tablet (40 mg total) by mouth daily. 90 tablet 3   sertraline (ZOLOFT) 100 MG tablet TAKE 1 AND 1/2 TABLETS     DAILY 135 tablet 3   Tiotropium Bromide Monohydrate (SPIRIVA RESPIMAT) 2.5 MCG/ACT AERS      Trospium Chloride 60 MG CP24 TAKE 1 CAPSULE DAILY AT 2PM 30 capsule 11   No current facility-administered medications on file prior to visit.   Allergies  Allergen Reactions   Bystolic [Nebivolol Hcl] Hives   Cephalexin Hives    Keflex*   Effexor [Venlafaxine] Other (See Comments)    Made her face hot and red   Levaquin [Levofloxacin In D5w] Other (See Comments)    Caused face to turn red and patient became extremely hot     Social History   Socioeconomic History   Marital status: Widowed    Spouse name: Not on file   Number of children: 3   Years of education: 10th    Highest education level: Not on file  Occupational History   Occupation: Retired  Tobacco Use   Smoking status: Every Day    Packs/day: 0.50    Years: 35.00    Additional pack years: 0.00    Total pack years: 17.50    Types: Cigarettes  Passive exposure: Never   Smokeless tobacco: Never  Vaping Use   Vaping Use: Never used  Substance and Sexual Activity   Alcohol use: No   Drug use: No   Sexual activity: Not on file  Other Topics Concern   Not on file  Social History Narrative   She is a widowed mother of 3, grandmother for, great-grandmother 1.  She still smokes about half pack a day.  She doesn't have about 35 years.  She does not drink.   Prior to her symptoms coming on, she used to do all kind of walking around and working in the garden and other activities with her close friend.   Social Determinants of Health   Financial Resource Strain: Low Risk  (01/23/2022)   Overall Financial Resource Strain (CARDIA)    Difficulty of Paying Living Expenses: Not very hard  Food Insecurity: No Food Insecurity (01/23/2022)   Hunger Vital Sign    Worried About Running Out of Food in the Last Year: Never true    Ran Out of Food in the Last Year: Never true  Transportation Needs: No Transportation Needs (01/23/2022)   PRAPARE - Administrator, Civil Service (Medical): No    Lack of Transportation (Non-Medical): No  Physical Activity: Insufficiently Active (01/23/2022)   Exercise Vital Sign    Days of Exercise per Week: 3 days    Minutes of Exercise per Session: 30 min  Stress: No Stress Concern Present (01/23/2022)   [employer] of Occupational Health - Occupational Stress Questionnaire    Feeling of Stress : Not at all  Social Connections: Moderately Integrated (01/23/2022)   Social Connection and Isolation Panel  [NHANES]    Frequency of Communication with Friends and Family: More than three times a week    Frequency of Social Gatherings with Friends and Family: More than three times a week    Attends Religious Services: More than 4 times per year    Active Member of [employer] or Organizations: Yes    Attends Banker Meetings: More than 4 times per year    Marital Status: Widowed  Intimate Partner Violence: Not At Risk (01/23/2022)   Humiliation, Afraid, Rape, and Kick questionnaire    Fear of Current or Ex-Partner: No    Emotionally Abused: No    Physically Abused: No    Sexually Abused: No      Review of Systems  All other systems reviewed and are negative.      Objective:   Physical Exam Vitals reviewed.  Constitutional:      General: She is not in acute distress.    Appearance: Normal appearance. She is normal weight. She is not ill-appearing or toxic-appearing.  HENT:     Head: Normocephalic and atraumatic.  Cardiovascular:     Rate and Rhythm: Normal rate and regular rhythm.     Pulses:          Dorsalis pedis pulses are 2+ on the right side and 2+ on the left side.       Posterior tibial pulses are 2+ on the right side and 2+ on the left side.     Heart sounds: Normal heart sounds.  Pulmonary:     Effort: Pulmonary effort is normal.     Breath sounds: Decreased air movement present. Decreased breath sounds and wheezing present. No rhonchi or rales.  Abdominal:     General: Bowel sounds are normal.     Palpations: Abdomen is soft.  Tenderness: There is no abdominal tenderness.  Lymphadenopathy:     Cervical: No cervical adenopathy.  Neurological:     Mental Status: She is alert.         Assessment & Plan:  Other fatigue - Plan: Vitamin B12, CBC with Differential/Platelet, COMPLETE METABOLIC PANEL WITH GFR, TSH, CK  Angina, class II (HCC) - Plan: nitroGLYCERIN (NITROSTAT) 0.4 MG SL tablet  COPD exacerbation (HCC) Patient is having a COPD exacerbation.   Begin a prednisone taper pack.  Use albuterol 2 puffs every 6 hours and add a Z-Pak due to the purulent sputum.  Switch Spiriva to Trelegy once daily.  Regarding the fatigue I will check a B12, CBC, CMP, TSH, CK level.

## 2023-01-09 LAB — CBC WITH DIFFERENTIAL/PLATELET
Absolute Monocytes: 876 cells/uL (ref 200–950)
Basophils Relative: 0.4 %
Eosinophils Relative: 2.8 %
HCT: 35.5 % (ref 35.0–45.0)
Lymphs Abs: 2925 cells/uL (ref 850–3900)
MCH: 27.6 pg (ref 27.0–33.0)
MCHC: 31.8 g/dL — ABNORMAL LOW (ref 32.0–36.0)
MCV: 86.8 fL (ref 80.0–100.0)
MPV: 10.5 fL (ref 7.5–12.5)
Monocytes Relative: 8.5 %
Neutro Abs: 6170 cells/uL (ref 1500–7800)
Neutrophils Relative %: 59.9 %
RBC: 4.09 10*6/uL (ref 3.80–5.10)
Total Lymphocyte: 28.4 %

## 2023-01-09 LAB — COMPLETE METABOLIC PANEL WITH GFR
AG Ratio: 1.4 (calc) (ref 1.0–2.5)
ALT: 15 U/L (ref 6–29)
AST: 19 U/L (ref 10–35)
Albumin: 4.2 g/dL (ref 3.6–5.1)
Alkaline phosphatase (APISO): 89 U/L (ref 37–153)
BUN: 18 mg/dL (ref 7–25)
CO2: 24 mmol/L (ref 20–32)
Calcium: 9.6 mg/dL (ref 8.6–10.4)
Chloride: 107 mmol/L (ref 98–110)
Creat: 0.82 mg/dL (ref 0.60–1.00)
Globulin: 2.9 g/dL (calc) (ref 1.9–3.7)
Glucose, Bld: 98 mg/dL (ref 65–99)
Potassium: 5.4 mmol/L — ABNORMAL HIGH (ref 3.5–5.3)
Sodium: 140 mmol/L (ref 135–146)
Total Bilirubin: 0.3 mg/dL (ref 0.2–1.2)
Total Protein: 7.1 g/dL (ref 6.1–8.1)
eGFR: 75 mL/min/{1.73_m2} (ref >=60)

## 2023-01-09 LAB — TSH: TSH: 0.34 mIU/L — ABNORMAL LOW (ref 0.40–4.50)

## 2023-01-09 LAB — VITAMIN B12: Vitamin B-12: 1611 pg/mL — ABNORMAL HIGH (ref 200–1100)

## 2023-01-09 LAB — CK: Total CK: 64 U/L (ref 29–143)

## 2023-01-11 ENCOUNTER — Other Ambulatory Visit: Payer: Self-pay

## 2023-01-11 DIAGNOSIS — J439 Emphysema, unspecified: Secondary | ICD-10-CM

## 2023-01-11 MED ORDER — LEVOTHYROXINE SODIUM 88 MCG PO TABS: 88.0000 ug | ORAL_TABLET | Freq: Every day | ORAL | 3 refills | Status: DC

## 2023-01-11 NOTE — Telephone Encounter (Signed)
[facility] called in to request a 90 day supply refill of this med:  albuterol (VENTOLIN HFA) 108 (90 Base) MCG/ACT inhaler [[id number]]  Ref number: [id number]  Please call [phone] if this med can be refilled please

## 2023-01-12 ENCOUNTER — Other Ambulatory Visit: Payer: Self-pay | Admitting: Family Medicine

## 2023-01-12 DIAGNOSIS — I2510 Atherosclerotic heart disease of native coronary artery without angina pectoris: Secondary | ICD-10-CM

## 2023-01-12 DIAGNOSIS — R5383 Other fatigue: Secondary | ICD-10-CM

## 2023-01-12 MED ORDER — ALBUTEROL SULFATE HFA 108 (90 BASE) MCG/ACT IN AERS
INHALATION_SPRAY | RESPIRATORY_TRACT | 1 refills | Status: DC
Start: 2023-01-12 — End: 2023-03-01

## 2023-01-12 NOTE — Progress Notes (Unsigned)
BCG Bladder Instillation  BCG # 1/3  Due to Bladder Cancer patient is present today for a BCG treatment. Patient was cleaned and prepped in a sterile fashion with betadine. A 14 FR catheter was inserted, urine return was noted 60 ml, urine was yellow in color.  50ml of reconstituted BCG was instilled into the bladder. The catheter was then removed. Patient tolerated well, no complications were noted  Performed by: Michiel Cowboy, PA-C and Honor Loh, CMA  Follow up/ Additional notes: One week for #2/3 BCG.  Cysto June 20th with Dr. Richardo Hanks

## 2023-01-12 NOTE — Telephone Encounter (Signed)
Requests different pharmacy. Requested Prescriptions  Pending Prescriptions Disp Refills   albuterol (VENTOLIN HFA) 108 (90 Base) MCG/ACT inhaler 18 g 1    Sig: USE 2 INHALATIONS ORALLY   EVERY 6 HOURS AS NEEDED FORWHEEZING OR SHORTNESS OF   BREATH     Pulmonology:  Beta Agonists 2 Failed - 01/11/2023  4:36 PM      Failed - Valid encounter within last 12 months    Recent Outpatient Visits           1 year ago Fatigue, unspecified type   [facility] Pickard, Priscille Heidelberg, MD   1 year ago Dysuria   [facility] Donita Brooks, MD   1 year ago Pneumonia due to COVID-19 virus   [facility] Pickard, Priscille Heidelberg, MD   1 year ago COPD exacerbation [facility])   [facility] Tanya Nones, Priscille Heidelberg, MD   1 year ago COPD exacerbation [facility])   [facility] Valentino Nose, NP       Future Appointments             Tomorrow Hulan Fray [facility] [place]   In 1 week McGowan, Elana Alm [facility] [place]   In 2 weeks McGowan, Elana Alm [facility] [place]            Passed - Last BP in normal range    BP Readings from Last 1 Encounters:  01/08/23 130/70         Passed - Last Heart Rate in normal range    Pulse Readings from Last 1 Encounters:  01/08/23 62

## 2023-01-13 ENCOUNTER — Ambulatory Visit (INDEPENDENT_AMBULATORY_CARE_PROVIDER_SITE_OTHER): Payer: Medicare HMO | Admitting: Urology

## 2023-01-13 DIAGNOSIS — C679 Malignant neoplasm of bladder, unspecified: Secondary | ICD-10-CM | POA: Diagnosis not present

## 2023-01-13 LAB — URINALYSIS, COMPLETE
Bilirubin, UA: NEGATIVE
Glucose, UA: NEGATIVE
Ketones, UA: NEGATIVE
Leukocytes,UA: NEGATIVE
Nitrite, UA: NEGATIVE
Protein,UA: NEGATIVE
RBC, UA: NEGATIVE
Specific Gravity, UA: 1.02 (ref 1.005–1.030)
Urobilinogen, Ur: 0.2 mg/dL (ref 0.2–1.0)
pH, UA: 5.5 (ref 5.0–7.5)

## 2023-01-13 LAB — MICROSCOPIC EXAMINATION

## 2023-01-13 MED ORDER — BCG LIVE 50 MG IS SUSR
3.2400 mL | Freq: Once | INTRAVESICAL | Status: AC
Start: 2023-01-13 — End: 2023-01-13
  Administered 2023-01-13: 81 mg via INTRAVESICAL

## 2023-01-19 NOTE — Progress Notes (Unsigned)
BCG Bladder Instillation  BCG # 2/3  Due to Bladder Cancer patient is present today for a BCG treatment. Patient was cleaned and prepped in a sterile fashion with betadine. A 14 FR catheter was inserted, urine return was noted 30 ml, urine was yellow clear in color.  50ml of reconstituted BCG was instilled into the bladder. The catheter was then removed. Patient tolerated well, no complications were noted  Performed by: Michiel Cowboy, PA-C and Wynona Dove, RN   Follow up/ Additional notes: One week for #3/3 BCG. Cysto June 20th with Dr. Richardo Hanks

## 2023-01-20 ENCOUNTER — Encounter: Payer: Self-pay | Admitting: Urology

## 2023-01-20 ENCOUNTER — Ambulatory Visit (INDEPENDENT_AMBULATORY_CARE_PROVIDER_SITE_OTHER): Payer: Medicare HMO | Admitting: Urology

## 2023-01-20 VITALS — BP 106/70 | HR 64 | Temp 97.9°F | Ht 62.0 in | Wt 163.0 lb

## 2023-01-20 DIAGNOSIS — C679 Malignant neoplasm of bladder, unspecified: Secondary | ICD-10-CM

## 2023-01-20 LAB — URINALYSIS, COMPLETE
Bilirubin, UA: NEGATIVE
Glucose, UA: NEGATIVE
Ketones, UA: NEGATIVE
Nitrite, UA: POSITIVE — AB
Protein,UA: NEGATIVE
Specific Gravity, UA: 1.02 (ref 1.005–1.030)
Urobilinogen, Ur: 0.2 mg/dL (ref 0.2–1.0)
pH, UA: 6 (ref 5.0–7.5)

## 2023-01-20 LAB — MICROSCOPIC EXAMINATION: WBC, UA: >30 /hpf — AB (ref 0–5)

## 2023-01-20 MED ORDER — BCG LIVE 50 MG IS SUSR
3.2400 mL | Freq: Once | INTRAVESICAL | Status: AC
Start: 2023-01-20 — End: 2023-01-20
  Administered 2023-01-20: 81 mg via INTRAVESICAL

## 2023-01-22 ENCOUNTER — Other Ambulatory Visit: Payer: Self-pay | Admitting: Family Medicine

## 2023-01-26 NOTE — Progress Notes (Signed)
BCG Bladder Instillation  BCG # 3/3  Due to Bladder Cancer patient is present today for a BCG treatment. Patient was cleaned and prepped in a sterile fashion with betadine. A 14 FR catheter was inserted, urine return was noted 10 ml, urine was yellow in color.  50ml of reconstituted BCG was instilled into the bladder. The catheter was then removed. Patient tolerated well, complications were noted as: she spasmed after the instillation and voided most of the BCG solution out.  I offered her another treatment next week, but she deferred.   Performed by: Michiel Cowboy, PA-C and Honor Loh, CMA  Follow up/ Additional notes: One week for #3/3 BCG. Cysto June 20th with Dr. Richardo Hanks

## 2023-01-27 ENCOUNTER — Ambulatory Visit (INDEPENDENT_AMBULATORY_CARE_PROVIDER_SITE_OTHER): Payer: Medicare HMO | Admitting: Urology

## 2023-01-27 DIAGNOSIS — R3129 Other microscopic hematuria: Secondary | ICD-10-CM

## 2023-01-27 DIAGNOSIS — C679 Malignant neoplasm of bladder, unspecified: Secondary | ICD-10-CM

## 2023-01-27 LAB — URINALYSIS, COMPLETE
Bilirubin, UA: NEGATIVE
Glucose, UA: NEGATIVE
Ketones, UA: NEGATIVE
Nitrite, UA: NEGATIVE
Protein,UA: NEGATIVE
Specific Gravity, UA: >1.03 — ABNORMAL HIGH (ref 1.005–1.030)
Urobilinogen, Ur: 0.2 mg/dL (ref 0.2–1.0)
pH, UA: 5.5 (ref 5.0–7.5)

## 2023-01-27 LAB — MICROSCOPIC EXAMINATION: WBC, UA: >30 /hpf — AB (ref 0–5)

## 2023-01-27 MED ORDER — BCG LIVE 50 MG IS SUSR
3.2400 mL | Freq: Once | INTRAVESICAL | Status: AC
Start: 2023-01-27 — End: 2023-01-27
  Administered 2023-01-27: 81 mg via INTRAVESICAL

## 2023-01-29 LAB — CULTURE, URINE COMPREHENSIVE

## 2023-02-01 LAB — CULTURE, URINE COMPREHENSIVE

## 2023-02-02 ENCOUNTER — Telehealth: Payer: Self-pay | Admitting: Family Medicine

## 2023-02-02 MED ORDER — GEMTESA 75 MG PO TABS: 1.0000 | ORAL_TABLET | Freq: Every day | ORAL | 11 refills | Status: DC

## 2023-02-02 MED ORDER — NITROFURANTOIN MONOHYD MACRO 100 MG PO CAPS: 100.0000 mg | ORAL_CAPSULE | Freq: Two times a day (BID) | ORAL | 0 refills | Status: DC

## 2023-02-02 NOTE — Telephone Encounter (Signed)
Spoke to patient and sent ABX to pharmacy. she also states the Trospium has not been working. She wants to switch back to [place], see how much it will cost.

## 2023-02-02 NOTE — Telephone Encounter (Signed)
-----   Message from Harle Battiest, PA-C sent at 02/02/2023 12:10 PM EDT ----- Macrobid 100 mg twice daily for seven days.  ----- Message ----- From: Honor Loh, CMA Sent: 02/02/2023  11:44 AM EDT To: Harle Battiest, PA-C  Patient states he is having UTI symptoms. She would like and antibiotic and is requesting no Sulfa medication, it breaks her out in a rash.

## 2023-02-06 ENCOUNTER — Other Ambulatory Visit: Payer: Self-pay | Admitting: Family Medicine

## 2023-02-08 NOTE — Telephone Encounter (Signed)
Requested medication (s) are due for refill today: yes  Requested medication (s) are on the active medication list: yes  Last refill:  11/09/22 #30/2  Future visit scheduled: no  Notes to clinic:  Unable to refill per protocol, cannot delegate. LOV 01/08/23     Requested Prescriptions  Pending Prescriptions Disp Refills   Eszopiclone 3 MG TABS [Pharmacy Med Name: ESZOPICLONE 3 MG TABLET] 30 tablet 2    Sig: TAKE 1 TABLET BY MOUTH AT BEDTIME TAKE IMMEDIATELY BEFORE BEDTIME     Not Delegated - Psychiatry:  Anxiolytics/Hypnotics Failed - 02/06/2023 11:02 AM      Failed - This refill cannot be delegated      Failed - Urine Drug Screen completed in last 360 days      Failed - Valid encounter within last 6 months    Recent Outpatient Visits           1 year ago Fatigue, unspecified type   [facility] Donita Brooks, MD   1 year ago Dysuria   [facility] Donita Brooks, MD   1 year ago Pneumonia due to COVID-19 virus   [facility] Pickard, Priscille Heidelberg, MD   1 year ago COPD exacerbation [facility])   [facility] Donita Brooks, MD   1 year ago COPD exacerbation [facility])   [facility] Medicine Valentino Nose, NP

## 2023-02-18 ENCOUNTER — Ambulatory Visit (INDEPENDENT_AMBULATORY_CARE_PROVIDER_SITE_OTHER): Payer: Medicare HMO | Admitting: Urology

## 2023-02-18 ENCOUNTER — Encounter: Payer: Self-pay | Admitting: Urology

## 2023-02-18 VITALS — BP 122/75 | HR 67 | Ht 62.0 in | Wt 163.0 lb

## 2023-02-18 DIAGNOSIS — Z8551 Personal history of malignant neoplasm of bladder: Secondary | ICD-10-CM | POA: Diagnosis not present

## 2023-02-18 DIAGNOSIS — C672 Malignant neoplasm of lateral wall of bladder: Secondary | ICD-10-CM

## 2023-02-18 NOTE — Progress Notes (Signed)
Bladder cancer surveillance note     UROLOGIC HISTORY Alexa Orozco is a 77 y.o. female who originally presented with gross hematuria and on CT was found to have a large 7+ cm bladder tumor at the right lateral wall, posterior wall, and anteriorly.   Initial Diagnosis of Bladder  Year: 10/2020 Pathology: High-grade T1 urothelial cell carcinoma   11/2020: Second look TURBT-pathology showing high-grade T1, muscle present and not involved     Treatments for Bladder Cancer Initial TURBT 10/2020 TURBT second look 11/2020 Induction BCG x6 completed July 2022 mBCG #1 05/2021 mBCG #2 10/2021 mBCG#3 05/2022 mBCG#4 12/2022   AUA Risk Category High   Cystoscopy Procedure Note:   After informed consent and discussion of the procedure and its risks, Alexa Orozco was positioned and prepped in the standard fashion. Cystoscopy was performed with the a flexible cystoscope. The urethra, bladder neck and entire bladder was visualized in a standard fashion.  Bladder mucosa grossly normal throughout, no tumor seen.  No abnormalities on retroflexion.  Extensive scar at right side of the bladder.  She continues to smoke, but has cut back somewhat.  Smoking cessation was discussed again, and we reviewed this is the highest risk factor for bladder cancer recurrence.  Continues on trospium with only moderate improvement in her OAB symptoms, has not been able to get on the beta 3 agonist secondary to insurance issues, but did well on Gemtesa previously.  Samples of Myrbetriq 50 mg daily given, if helpful can send in prescription and try to get prior Auth.  CT urogram 12/21/2022 reviewed, no evidence of recurrence  -Maintenance BCG November 2024 x 3, followed by cystoscopy in January 2025 -Myrbetriq samples given, okay to fill prescription if helpful, will likely need prior Darrold Span, MD 02/18/2023

## 2023-02-24 ENCOUNTER — Telehealth: Payer: Self-pay

## 2023-02-24 MED ORDER — MIRABEGRON ER 25 MG PO TB24: 25.0000 mg | ORAL_TABLET | Freq: Every day | ORAL | 5 refills | Status: DC

## 2023-02-24 NOTE — Telephone Encounter (Signed)
Called left on VM- [id number]  Pt states [facility] gave her samples of Myrbetriq. It is working well however she thinks it is causing her back pain and stomach cramps. She has been taking med for 7 days and noticed these sx after a few days.   Per CMA these are not common s/e. Pt aware. She states she will d/c for the next 5 days to see if her sx decrease. She would like a rx sent it as the med is working very well for her.   [facility]

## 2023-02-25 ENCOUNTER — Ambulatory Visit (HOSPITAL_COMMUNITY)
Admission: RE | Admit: 2023-02-25 | Discharge: 2023-02-25 | Disposition: A | Discharge status: Home / Self Care | Payer: Medicare HMO | Source: Ambulatory Visit | Attending: Family Medicine | Admitting: Family Medicine

## 2023-02-25 DIAGNOSIS — E785 Hyperlipidemia, unspecified: Secondary | ICD-10-CM | POA: Diagnosis not present

## 2023-02-25 DIAGNOSIS — I2510 Atherosclerotic heart disease of native coronary artery without angina pectoris: Secondary | ICD-10-CM | POA: Insufficient documentation

## 2023-02-25 DIAGNOSIS — J449 Chronic obstructive pulmonary disease, unspecified: Secondary | ICD-10-CM | POA: Insufficient documentation

## 2023-02-25 DIAGNOSIS — I10 Essential (primary) hypertension: Secondary | ICD-10-CM | POA: Insufficient documentation

## 2023-02-25 DIAGNOSIS — R5383 Other fatigue: Secondary | ICD-10-CM

## 2023-02-25 LAB — ECHOCARDIOGRAM COMPLETE
AR max vel: 2.88 cm2
AV Area VTI: 3.31 cm2
AV Area mean vel: 2.82 cm2
AV Mean grad: 4 mmHg
AV Peak grad: 8.8 mmHg
Ao pk vel: 1.49 m/s
Area-P 1/2: 2.66 cm2
Calc EF: 62.3 %
S' Lateral: 2.3 cm
Single Plane A2C EF: 59.7 %
Single Plane A4C EF: 66.8 %

## 2023-02-26 ENCOUNTER — Other Ambulatory Visit: Payer: Self-pay | Admitting: Family Medicine

## 2023-02-26 DIAGNOSIS — J439 Emphysema, unspecified: Secondary | ICD-10-CM

## 2023-03-01 NOTE — Telephone Encounter (Signed)
Requested medication (s) are due for refill today: last refill 02/06/23  Requested medication (s) are on the active medication list: yes   Last refill:  01/12/23 #18g 1 refills  Future visit scheduled: no   Notes to clinic:  last OV 01/08/23. Last refill 02/06/23. No refills remain do you want to refill Rx?     Requested Prescriptions  Pending Prescriptions Disp Refills   albuterol (VENTOLIN HFA) 108 (90 Base) MCG/ACT inhaler [Pharmacy Med Name: ALBUTEROL HFA (VENTOLIN) INH] 18 each 1    Sig: USE 2 INHALATIONS ORALLY   EVERY 6 HOURS AS NEEDED FORWHEEZING OR SHORTNESS OF   BREATH     Pulmonology:  Beta Agonists 2 Failed - 02/26/2023  9:46 AM      Failed - Valid encounter within last 12 months    Recent Outpatient Visits           1 year ago Fatigue, unspecified type   [facility] Pickard, Priscille Heidelberg, MD   1 year ago Dysuria   [facility] Donita Brooks, MD   1 year ago Pneumonia due to COVID-19 virus   [facility] Medicine Pickard, Priscille Heidelberg, MD   1 year ago COPD exacerbation [facility])   [facility] Donita Brooks, MD   2 years ago COPD exacerbation [facility])   [facility] Medicine Cathlean Marseilles A, NP              Passed - Last BP in normal range    BP Readings from Last 1 Encounters:  02/18/23 122/75         Passed - Last Heart Rate in normal range    Pulse Readings from Last 1 Encounters:  02/18/23 67

## 2023-03-02 MED ORDER — MIRABEGRON ER 50 MG PO TB24: 50.0000 mg | ORAL_TABLET | Freq: Every day | ORAL | 5 refills | Status: DC

## 2023-03-02 NOTE — Addendum Note (Signed)
Addended by: Consuella Lose on: 03/02/2023 01:56 PM   Modules accepted: Orders

## 2023-03-02 NOTE — Telephone Encounter (Signed)
Patient left message on triage line stating she received RX for Myrbetriq 25 mg but she has been on 50 mg samples. Confirmed dose per LOV note on 02/18/23 and it is 50 mg. Called patient and left detailed message apologizing for the error and new RX with the correct dose has been sent to the pharmacy.

## 2023-03-08 ENCOUNTER — Telehealth: Payer: Self-pay | Admitting: Family Medicine

## 2023-03-08 NOTE — Telephone Encounter (Signed)
Patient called in asking if she can come by on Wednesday since she has another doctor's appt that day to leave a urine sample. Her SX are lower back pain, burning during urination.  CB# [phone]

## 2023-03-09 ENCOUNTER — Other Ambulatory Visit: Payer: Self-pay | Admitting: Family Medicine

## 2023-03-09 MED ORDER — SULFAMETHOXAZOLE-TRIMETHOPRIM 800-160 MG PO TABS: 1.0000 | ORAL_TABLET | Freq: Two times a day (BID) | ORAL | 0 refills | Status: DC

## 2023-03-16 ENCOUNTER — Other Ambulatory Visit: Payer: Self-pay

## 2023-03-16 MED ORDER — MIRABEGRON ER 50 MG PO TB24: 50.0000 mg | ORAL_TABLET | Freq: Every day | ORAL | 2 refills | Status: DC

## 2023-03-16 NOTE — Telephone Encounter (Signed)
Patient left a message asking for [facility] RX sent in and patient advised.

## 2023-04-01 ENCOUNTER — Other Ambulatory Visit: Payer: Self-pay | Admitting: Family Medicine

## 2023-04-01 NOTE — Telephone Encounter (Signed)
Requested medications are due for refill today.  Pt is requesting that rx be sent to a different pharmacy  Requested medications are on the active medications list.  yes  Last refill. 01/11/2023 #90 3 rf  Future visit scheduled.   no  Notes to clinic.  Pt is requesting that rx be sent to a different pharmacy. Abnormal labs.    Requested Prescriptions  Pending Prescriptions Disp Refills   levothyroxine (SYNTHROID) 88 MCG tablet 90 tablet 3    Sig: Take 1 tablet (88 mcg total) by mouth daily.     Endocrinology:  Hypothyroid Agents Failed - 04/01/2023  9:21 AM      Failed - TSH in normal range and within 360 days    TSH  Date Value Ref Range Status  01/08/2023 0.34 (L) 0.40 - 4.50 mIU/L Final         Failed - Valid encounter within last 12 months    Recent Outpatient Visits           1 year ago Fatigue, unspecified type   [facility] Priscille Heidelberg, MD   1 year ago Dysuria   [facility] Donita Brooks, MD   1 year ago Pneumonia due to COVID-19 virus   [facility] Pickard, Priscille Heidelberg, MD   1 year ago COPD exacerbation [facility])   [facility] Donita Brooks, MD   2 years ago COPD exacerbation [facility])   [facility] Valentino Nose, NP

## 2023-04-01 NOTE — Telephone Encounter (Signed)
Prescription Request  04/01/2023  LOV: 01/08/2023  What is the name of the medication or equipment?   levothyroxine (SYNTHROID) 88 MCG tablet  **90 day supply requested**  Have you contacted your pharmacy to request a refill? Yes   Which pharmacy would you like this sent to?  [id number] [place], [place] - [address] AT [facility]ENTER [address] [place] [address] Phone: [phone] Fax: [phone]    Patient notified that their request is being sent to the clinical staff for review and that they should receive a response within 2 business days.   Please advise pharmacist.

## 2023-04-05 MED ORDER — LEVOTHYROXINE SODIUM 88 MCG PO TABS: 88.0000 ug | ORAL_TABLET | Freq: Every day | ORAL | 3 refills | Status: DC

## 2023-05-09 ENCOUNTER — Other Ambulatory Visit: Payer: Self-pay | Admitting: Family Medicine

## 2023-05-09 DIAGNOSIS — J439 Emphysema, unspecified: Secondary | ICD-10-CM

## 2023-05-10 NOTE — Telephone Encounter (Signed)
Prescription Request  05/10/2023  LOV: 01/08/2023  What is the name of the medication or equipment? levothyroxine (SYNTHROID) 88 MCG tablet [[id number]]  Have you contacted your pharmacy to request a refill? Yes   Which pharmacy would you like this sent to?  [id number] [place], [place] - [address] AT [facility] CENTER [address] [place]  [address] Phone: [phone] Fax: [phone]    Patient notified that their request is being sent to the clinical staff for review and that they should receive a response within 2 business days.   Please advise at [facility] [phone]

## 2023-05-11 NOTE — Telephone Encounter (Signed)
Requested medication (s) are due for refill today: Yes  Requested medication (s) are on the active medication list: Yes  Last refill:  02/11/23  Future visit scheduled:   Notes to clinic:  Not delegated.    Requested Prescriptions  Pending Prescriptions Disp Refills   Eszopiclone 3 MG TABS [Pharmacy Med Name: ESZOPICLONE 3 MG TABLET] 30 tablet 2    Sig: TAKE 1 TABLET BY MOUTH AT BEDTIME TAKE IMMEDIATELY BEFORE BEDTIME     Not Delegated - Psychiatry:  Anxiolytics/Hypnotics Failed - 05/10/2023 11:57 AM      Failed - This refill cannot be delegated      Failed - Urine Drug Screen completed in last 360 days      Failed - Valid encounter within last 6 months    Recent Outpatient Visits           1 year ago Fatigue, unspecified type   [facility] Pickard, Priscille Heidelberg, MD   1 year ago Dysuria   [facility] Tanya Nones, Priscille Heidelberg, MD   1 year ago Pneumonia due to COVID-19 virus   [facility] Donita Brooks, MD   2 years ago COPD exacerbation (HCC)   [facility] Tanya Nones, Priscille Heidelberg, MD   2 years ago COPD exacerbation (HCC)   [facility] Valentino Nose, NP              Signed Prescriptions Disp Refills   albuterol (VENTOLIN HFA) 108 (90 Base) MCG/ACT inhaler 18 each 1    Sig: USE 2 INHALATIONS ORALLY EVERY 6 HOURS AS NEEDED FORWHEEZING OR SHORTNESS OF BREATH     Pulmonology:  Beta Agonists 2 Failed - 05/10/2023 11:57 AM      Failed - Valid encounter within last 12 months    Recent Outpatient Visits           1 year ago Fatigue, unspecified type   [facility] Medicine Pickard, Priscille Heidelberg, MD   1 year ago Dysuria   [facility] Tanya Nones, Priscille Heidelberg, MD   1 year ago Pneumonia due to COVID-19 virus   [facility] Donita Brooks, MD   2 years ago COPD exacerbation [facility])   [facility] Donita Brooks, MD   2 years ago COPD exacerbation  [facility])   [facility] Medicine Cathlean Marseilles A, NP              Passed - Last BP in normal range    BP Readings from Last 1 Encounters:  02/18/23 122/75         Passed - Last Heart Rate in normal range    Pulse Readings from Last 1 Encounters:  02/18/23 67

## 2023-05-11 NOTE — Telephone Encounter (Signed)
Requested Prescriptions  Pending Prescriptions Disp Refills   Eszopiclone 3 MG TABS [Pharmacy Med Name: ESZOPICLONE 3 MG TABLET] 30 tablet 2    Sig: TAKE 1 TABLET BY MOUTH AT BEDTIME TAKE IMMEDIATELY BEFORE BEDTIME     Not Delegated - Psychiatry:  Anxiolytics/Hypnotics Failed - 05/10/2023 11:57 AM      Failed - This refill cannot be delegated      Failed - Urine Drug Screen completed in last 360 days      Failed - Valid encounter within last 6 months    Recent Outpatient Visits           1 year ago Fatigue, unspecified type   [facility] Pickard, Priscille Heidelberg, MD   1 year ago Dysuria   [facility] Tanya Nones, Priscille Heidelberg, MD   1 year ago Pneumonia due to COVID-19 virus   [facility] Tanya Nones, Priscille Heidelberg, MD   2 years ago COPD exacerbation (HCC)   [facility] Tanya Nones, Priscille Heidelberg, MD   2 years ago COPD exacerbation (HCC)   [facility] Valentino Nose, NP               albuterol (VENTOLIN HFA) 108 (90 Base) MCG/ACT inhaler [Pharmacy Med Name: ALBUTEROL HFA (VENTOLIN) INH] 18 each 1    Sig: USE 2 INHALATIONS ORALLY EVERY 6 HOURS AS NEEDED FORWHEEZING OR SHORTNESS OF BREATH     Pulmonology:  Beta Agonists 2 Failed - 05/10/2023 11:57 AM      Failed - Valid encounter within last 12 months    Recent Outpatient Visits           1 year ago Fatigue, unspecified type   [facility] Medicine Pickard, Priscille Heidelberg, MD   1 year ago Dysuria   [facility] Medicine Tanya Nones, Priscille Heidelberg, MD   1 year ago Pneumonia due to COVID-19 virus   [facility] Medicine Donita Brooks, MD   2 years ago COPD exacerbation [facility])   [facility] Donita Brooks, MD   2 years ago COPD exacerbation [facility])   [facility] Medicine Cathlean Marseilles A, NP              Passed - Last BP in normal range    BP Readings from Last 1 Encounters:  02/18/23 122/75         Passed - Last Heart  Rate in normal range    Pulse Readings from Last 1 Encounters:  02/18/23 67

## 2023-05-11 NOTE — Telephone Encounter (Signed)
Requested medication (s) are due for refill today: Yes  Requested medication (s) are on the active medication list: yes  Last refill:  02/11/23  Future visit scheduled:   Notes to clinic:  Not delegated.    Requested Prescriptions  Pending Prescriptions Disp Refills   Eszopiclone 3 MG TABS [Pharmacy Med Name: ESZOPICLONE 3 MG TABLET] 30 tablet 2    Sig: TAKE 1 TABLET BY MOUTH AT BEDTIME TAKE IMMEDIATELY BEFORE BEDTIME     Not Delegated - Psychiatry:  Anxiolytics/Hypnotics Failed - 05/10/2023 11:57 AM      Failed - This refill cannot be delegated      Failed - Urine Drug Screen completed in last 360 days      Failed - Valid encounter within last 6 months    Recent Outpatient Visits           1 year ago Fatigue, unspecified type   [facility] Medicine Pickard, Priscille Heidelberg, MD   1 year ago Dysuria   [facility] Medicine Tanya Nones, Priscille Heidelberg, MD   1 year ago Pneumonia due to COVID-19 virus   [facility] Medicine Donita Brooks, MD   2 years ago COPD exacerbation (HCC)   [facility] Tanya Nones, Priscille Heidelberg, MD   2 years ago COPD exacerbation (HCC)   [facility] Valentino Nose, NP              Signed Prescriptions Disp Refills   albuterol (VENTOLIN HFA) 108 (90 Base) MCG/ACT inhaler 18 each 1    Sig: USE 2 INHALATIONS ORALLY EVERY 6 HOURS AS NEEDED FORWHEEZING OR SHORTNESS OF BREATH     Pulmonology:  Beta Agonists 2 Failed - 05/10/2023 11:57 AM      Failed - Valid encounter within last 12 months    Recent Outpatient Visits           1 year ago Fatigue, unspecified type   [facility] Medicine Pickard, Priscille Heidelberg, MD   1 year ago Dysuria   [facility] Medicine Tanya Nones, Priscille Heidelberg, MD   1 year ago Pneumonia due to COVID-19 virus   [facility] Donita Brooks, MD   2 years ago COPD exacerbation [facility])   [facility] Donita Brooks, MD   2 years ago COPD exacerbation  [facility])   [facility] Medicine Cathlean Marseilles A, NP              Passed - Last BP in normal range    BP Readings from Last 1 Encounters:  02/18/23 122/75         Passed - Last Heart Rate in normal range    Pulse Readings from Last 1 Encounters:  02/18/23 67

## 2023-05-12 NOTE — Telephone Encounter (Signed)
Pharmacy requesting refills of   Eszopiclone 3 MG TABS [[id number]]   levothyroxine (SYNTHROID) 88 MCG tablet - **90 day script requested**  [facility]acy:   [id number] [place], [place] - [address] AT [facility] [address], [place] [address] Phone: [phone]  Fax: [phone] DEA #: [id number]   **LOV: 01/08/2023  Please advise patient at [phone]

## 2023-05-12 NOTE — Telephone Encounter (Signed)
Requested medication (s) are due for refill today: Due 05/14/23  Requested medication (s) are on the active medication list: yes    Last refill: 11/11/22  #30   2 refills  Future visit scheduled no  Notes to clinic:Not delegated, please review. Thank you.  Requested Prescriptions  Pending Prescriptions Disp Refills   Eszopiclone 3 MG TABS [Pharmacy Med Name: ESZOPICLONE 3 MG TABLET] 30 tablet 2    Sig: TAKE 1 TABLET BY MOUTH AT BEDTIME TAKE IMMEDIATELY BEFORE BEDTIME     Not Delegated - Psychiatry:  Anxiolytics/Hypnotics Failed - 05/12/2023  4:20 PM      Failed - This refill cannot be delegated      Failed - Urine Drug Screen completed in last 360 days      Failed - Valid encounter within last 6 months    Recent Outpatient Visits           1 year ago Fatigue, unspecified type   [facility] Pickard, Priscille Heidelberg, MD   1 year ago Dysuria   [facility] Tanya Nones, Priscille Heidelberg, MD   1 year ago Pneumonia due to COVID-19 virus   [facility] Medicine Tanya Nones, Priscille Heidelberg, MD   2 years ago COPD exacerbation (HCC)   [facility] Tanya Nones, Priscille Heidelberg, MD   2 years ago COPD exacerbation (HCC)   [facility] Valentino Nose, NP              Signed Prescriptions Disp Refills   albuterol (VENTOLIN HFA) 108 (90 Base) MCG/ACT inhaler 18 each 1    Sig: USE 2 INHALATIONS ORALLY EVERY 6 HOURS AS NEEDED FORWHEEZING OR SHORTNESS OF BREATH     Pulmonology:  Beta Agonists 2 Failed - 05/10/2023 11:57 AM      Failed - Valid encounter within last 12 months    Recent Outpatient Visits           1 year ago Fatigue, unspecified type   [facility] Medicine Pickard, Priscille Heidelberg, MD   1 year ago Dysuria   [facility] Tanya Nones, Priscille Heidelberg, MD   1 year ago Pneumonia due to COVID-19 virus   [facility] Donita Brooks, MD   2 years ago COPD exacerbation [facility])   [facility] Donita Brooks, MD   2 years ago COPD exacerbation [facility])   [facility] Medicine Cathlean Marseilles A, NP              Passed - Last BP in normal range    BP Readings from Last 1 Encounters:  02/18/23 122/75         Passed - Last Heart Rate in normal range    Pulse Readings from Last 1 Encounters:  02/18/23 67

## 2023-05-13 NOTE — Telephone Encounter (Signed)
Patient called to check the status  of the refill for her eszopiclone 3 mg tab.  CB# [phone]

## 2023-05-29 ENCOUNTER — Other Ambulatory Visit: Payer: Self-pay | Admitting: Family Medicine

## 2023-05-29 DIAGNOSIS — J302 Other seasonal allergic rhinitis: Secondary | ICD-10-CM

## 2023-05-29 DIAGNOSIS — J441 Chronic obstructive pulmonary disease with (acute) exacerbation: Secondary | ICD-10-CM

## 2023-05-29 DIAGNOSIS — F339 Major depressive disorder, recurrent, unspecified: Secondary | ICD-10-CM

## 2023-05-31 NOTE — Telephone Encounter (Signed)
Pantoprazole 90/3 06/19/22   pravastatin: 90/3 06/24/23 Montelukast: 90/3 06/19/22    sertraline: 135/3 06/19/22  Requested Prescriptions  Refused Prescriptions Disp Refills   pantoprazole (PROTONIX) 40 MG tablet [Pharmacy Med Name: PANTOPRAZOLE TAB 40MG  DR] 90 tablet 3    Sig: TAKE 1 TABLET EVERY MORNING     Gastroenterology: Proton Pump Inhibitors Failed - 05/29/2023  2:08 AM      Failed - Valid encounter within last 12 months    Recent Outpatient Visits           1 year ago Fatigue, unspecified type   [facility] Donita Brooks, MD   1 year ago Dysuria   [facility] Donita Brooks, MD   2 years ago Pneumonia due to COVID-19 virus   [facility] Tanya Nones, Priscille Heidelberg, MD   2 years ago COPD exacerbation (HCC)   [facility] Donita Brooks, MD   2 years ago COPD exacerbation (HCC)   [facility] Valentino Nose, NP               pravastatin (PRAVACHOL) 40 MG tablet [Pharmacy Med Name: PRAVASTATIN  TAB 40MG ] 90 tablet 3    Sig: TAKE 1 TABLET DAILY     Cardiovascular:  Antilipid - Statins Failed - 05/29/2023  2:08 AM      Failed - Valid encounter within last 12 months    Recent Outpatient Visits           1 year ago Fatigue, unspecified type   [facility] Pickard, Priscille Heidelberg, MD   1 year ago Dysuria   [facility] Donita Brooks, MD   2 years ago Pneumonia due to COVID-19 virus   [facility] Medicine Donita Brooks, MD   2 years ago COPD exacerbation [facility])   [facility] Tanya Nones, Priscille Heidelberg, MD   2 years ago COPD exacerbation [facility])   [facility] Medicine Cathlean Marseilles A, NP              Failed - Lipid Panel in normal range within the last 12 months    Cholesterol  Date Value Ref Range Status  06/01/2022 190 <200 mg/dL Final   LDL Cholesterol (Calc)  Date Value Ref Range Status  06/01/2022 101 (H)  mg/dL (calc) Final    Comment:    Reference range: <100 . Desirable range <100 mg/dL for primary prevention;   <70 mg/dL for patients with CHD or diabetic patients  with > or = 2 CHD risk factors. Marland Kitchen LDL-C is now calculated using the Martin-Hopkins  calculation, which is a validated novel method providing  better accuracy than the Friedewald equation in the  estimation of LDL-C.  Horald Pollen et al. Lenox Ahr. 6962;952(84): 2061-2068  (http://education.[employer].com/faq/FAQ164)    HDL  Date Value Ref Range Status  06/01/2022 69 > OR = 50 mg/dL Final   Triglycerides  Date Value Ref Range Status  06/01/2022 106 <150 mg/dL Final         Passed - Patient is not pregnant       montelukast (SINGULAIR) 10 MG tablet [Pharmacy Med Name: MONTELUKAST  TAB 10MG ] 90 tablet 3    Sig: TAKE 1 TABLET AT BEDTIME     Pulmonology:  Leukotriene Inhibitors Failed - 05/29/2023  2:08 AM      Failed - Valid encounter within last 12 months    Recent Outpatient Visits  1 year ago Fatigue, unspecified type   [facility] Medicine Pickard, Priscille Heidelberg, MD   1 year ago Dysuria   [facility] Tanya Nones, Priscille Heidelberg, MD   2 years ago Pneumonia due to COVID-19 virus   [facility] Medicine Tanya Nones, Priscille Heidelberg, MD   2 years ago COPD exacerbation [facility])   [facility] Medicine Tanya Nones, Priscille Heidelberg, MD   2 years ago COPD exacerbation [facility])   [facility] Valentino Nose, NP               sertraline (ZOLOFT) 100 MG tablet [Pharmacy Med Name: SERTRALINE TAB 100MG ] 135 tablet 3    Sig: TAKE 1 AND 1/2 TABLETS     DAILY     Psychiatry:  Antidepressants - SSRI - sertraline Failed - 05/29/2023  2:08 AM      Failed - Valid encounter within last 6 months    Recent Outpatient Visits           1 year ago Fatigue, unspecified type   [facility] Pickard, Priscille Heidelberg, MD   1 year ago Dysuria   [facility] Donita Brooks, MD   2 years ago Pneumonia due to COVID-19 virus   [facility] Medicine Donita Brooks, MD   2 years ago COPD exacerbation [facility])   [facility] Donita Brooks, MD   2 years ago COPD exacerbation [facility])   [facility] Valentino Nose, NP              Passed - AST in normal range and within 360 days    AST  Date Value Ref Range Status  01/08/2023 19 10 - 35 U/L Final         Passed - ALT in normal range and within 360 days    ALT  Date Value Ref Range Status  01/08/2023 15 6 - 29 U/L Final         Passed - Completed PHQ-2 or PHQ-9 in the last 360 days

## 2023-06-09 ENCOUNTER — Telehealth: Payer: Self-pay | Admitting: Family Medicine

## 2023-06-09 ENCOUNTER — Other Ambulatory Visit: Payer: Self-pay

## 2023-06-09 DIAGNOSIS — R5383 Other fatigue: Secondary | ICD-10-CM

## 2023-06-09 DIAGNOSIS — E039 Hypothyroidism, unspecified: Secondary | ICD-10-CM

## 2023-06-09 MED ORDER — LEVOTHYROXINE SODIUM 88 MCG PO TABS
88.0000 ug | ORAL_TABLET | Freq: Every day | ORAL | 1 refills | Status: DC
Start: 2023-06-09 — End: 2023-07-15

## 2023-06-09 NOTE — Telephone Encounter (Signed)
Prescription Request  06/09/2023  LOV: 01/08/2023  What is the name of the medication or equipment?   levothyroxine (SYNTHROID) 88 MCG tablet [[id number]]  **90 day script requested**  Have you contacted your pharmacy to request a refill? Yes   Which pharmacy would you like this sent to?  [id number] [place], [place] - [address] AT [facility] [address] [place]  [address] Phone: [phone] Fax: [phone]    Patient notified that their request is being sent to the clinical staff for review and that they should receive a response within 2 business days.   Please advise pharmacist.

## 2023-06-09 NOTE — Telephone Encounter (Signed)
Patient called after being told by her pharmacy these were denied. She was seen in May, she would like to know if they can be called in.  CB# [phone]

## 2023-06-25 ENCOUNTER — Telehealth: Payer: Self-pay

## 2023-06-25 DIAGNOSIS — J302 Other seasonal allergic rhinitis: Secondary | ICD-10-CM

## 2023-06-25 DIAGNOSIS — F339 Major depressive disorder, recurrent, unspecified: Secondary | ICD-10-CM

## 2023-06-25 DIAGNOSIS — J441 Chronic obstructive pulmonary disease with (acute) exacerbation: Secondary | ICD-10-CM

## 2023-06-25 MED ORDER — PANTOPRAZOLE SODIUM 40 MG PO TBEC: 40.0000 mg | DELAYED_RELEASE_TABLET | Freq: Every morning | ORAL | 0 refills | Status: DC

## 2023-06-25 MED ORDER — MONTELUKAST SODIUM 10 MG PO TABS
ORAL_TABLET | ORAL | 0 refills | Status: AC
Start: 2023-06-25 — End: ?

## 2023-06-25 MED ORDER — SERTRALINE HCL 100 MG PO TABS
150.0000 mg | ORAL_TABLET | Freq: Every day | ORAL | 0 refills | Status: DC
Start: 2023-06-25 — End: 2023-07-23

## 2023-06-25 MED ORDER — PRAVASTATIN SODIUM 40 MG PO TABS: 40.0000 mg | ORAL_TABLET | Freq: Every day | ORAL | 0 refills | Status: DC

## 2023-06-25 NOTE — Telephone Encounter (Signed)
Pt called in to request refills of these meds:   montelukast (SINGULAIR) 10 MG tablet [[id number]] pravastatin (PRAVACHOL) 40 MG tablet [[id number]] pantoprazole (PROTONIX) 40 MG tablet [[id number]] sertraline (ZOLOFT) 100 MG tablet [[id number]]  LOV: 01/08/23  [facility] [place], [place] - [address] [facility] AT Portal to Registered [address] [place], [place] [address] Phone: [phone]  Fax: [phone]  CB#: [phone]

## 2023-07-05 ENCOUNTER — Other Ambulatory Visit (HOSPITAL_COMMUNITY): Payer: Self-pay | Admitting: Family Medicine

## 2023-07-05 DIAGNOSIS — Z1231 Encounter for screening mammogram for malignant neoplasm of breast: Secondary | ICD-10-CM

## 2023-07-09 ENCOUNTER — Other Ambulatory Visit: Payer: Self-pay

## 2023-07-09 DIAGNOSIS — J439 Emphysema, unspecified: Secondary | ICD-10-CM

## 2023-07-09 MED ORDER — ALBUTEROL SULFATE HFA 108 (90 BASE) MCG/ACT IN AERS
INHALATION_SPRAY | RESPIRATORY_TRACT | 0 refills | Status: DC
Start: 2023-07-09 — End: 2023-08-09

## 2023-07-09 NOTE — Telephone Encounter (Signed)
Prescription Request  07/09/2023  LOV: 01/08/23  What is the name of the medication or equipment? albuterol (VENTOLIN HFA) 108 (90 Base) MCG/ACT inhaler [[id number]]  Have you contacted your pharmacy to request a refill? Yes   Which pharmacy would you like this sent to?  [id number] [place], [place] - [address] AT [facility] [address] [place] [address] Phone: [phone] Fax: [phone]    Patient notified that their request is being sent to the clinical staff for review and that they should receive a response within 2 business days.   Please advise at [facility] [phone]

## 2023-07-09 NOTE — Telephone Encounter (Signed)
Requested Prescriptions  Pending Prescriptions Disp Refills   albuterol (VENTOLIN HFA) 108 (90 Base) MCG/ACT inhaler 18 each 0    Sig: USE 2 INHALATIONS ORALLY   EVERY 6 HOURS AS NEEDED FORWHEEZING OR SHORTNESS OF   BREATH     Pulmonology:  Beta Agonists 2 Failed - 07/09/2023  9:57 AM      Failed - Valid encounter within last 12 months    Recent Outpatient Visits           1 year ago Fatigue, unspecified type   [facility] Medicine Pickard, Priscille Heidelberg, MD   2 years ago Dysuria   [facility] Donita Brooks, MD   2 years ago Pneumonia due to COVID-19 virus   [facility] Donita Brooks, MD   2 years ago COPD exacerbation [facility])   [facility] Donita Brooks, MD   2 years ago COPD exacerbation [facility])   [facility] Medicine Cathlean Marseilles A, NP              Passed - Last BP in normal range    BP Readings from Last 1 Encounters:  02/18/23 122/75         Passed - Last Heart Rate in normal range    Pulse Readings from Last 1 Encounters:  02/18/23 67

## 2023-07-12 ENCOUNTER — Other Ambulatory Visit: Payer: Self-pay | Admitting: Family Medicine

## 2023-07-12 DIAGNOSIS — J302 Other seasonal allergic rhinitis: Secondary | ICD-10-CM

## 2023-07-12 DIAGNOSIS — J441 Chronic obstructive pulmonary disease with (acute) exacerbation: Secondary | ICD-10-CM

## 2023-07-13 NOTE — Telephone Encounter (Signed)
Requested medication (s) are due for refill today:   Yes for all 3  Requested medication (s) are on the active medication list:   Yes for all 3  Future visit scheduled:   No  LOV 01/08/2023  Per note needs OV for CPE for further refills   Last ordered: 06/25/2023 #30, 0 refills each  Returned because she needs OV for CPE.   Courtesy refills given in Oct.     Requested Prescriptions  Pending Prescriptions Disp Refills   pantoprazole (PROTONIX) 40 MG tablet [Pharmacy Med Name: PANTOPRAZOLE TAB 40MG  DR] 30 tablet 0    Sig: TAKE 1 TABLET EVERY MORNING     Gastroenterology: Proton Pump Inhibitors Failed - 07/12/2023  2:06 AM      Failed - Valid encounter within last 12 months    Recent Outpatient Visits           1 year ago Fatigue, unspecified type   [facility] Pickard, Priscille Heidelberg, MD   2 years ago Dysuria   [facility] Donita Brooks, MD   2 years ago Pneumonia due to COVID-19 virus   [facility] Tanya Nones, Priscille Heidelberg, MD   2 years ago COPD exacerbation (HCC)   [facility] Tanya Nones, Priscille Heidelberg, MD   2 years ago COPD exacerbation (HCC)   [facility] Medicine Valentino Nose, NP               montelukast (SINGULAIR) 10 MG tablet [Pharmacy Med Name: MONTELUKAST  TAB 10MG ] 30 tablet 0    Sig: TAKE 1 TABLET AT BEDTIME     Pulmonology:  Leukotriene Inhibitors Failed - 07/12/2023  2:06 AM      Failed - Valid encounter within last 12 months    Recent Outpatient Visits           1 year ago Fatigue, unspecified type   [facility] Pickard, Priscille Heidelberg, MD   2 years ago Dysuria   [facility] Donita Brooks, MD   2 years ago Pneumonia due to COVID-19 virus   [facility] Medicine Tanya Nones, Priscille Heidelberg, MD   2 years ago COPD exacerbation (HCC)   [facility] Medicine Tanya Nones, Priscille Heidelberg, MD   2 years ago COPD exacerbation (HCC)   [facility] Medicine  Valentino Nose, NP               pravastatin (PRAVACHOL) 40 MG tablet [Pharmacy Med Name: PRAVASTATIN  TAB 40MG ] 30 tablet 0    Sig: TAKE 1 TABLET DAILY     Cardiovascular:  Antilipid - Statins Failed - 07/12/2023  2:06 AM      Failed - Valid encounter within last 12 months    Recent Outpatient Visits           1 year ago Fatigue, unspecified type   [facility] Medicine Donita Brooks, MD   2 years ago Dysuria   [facility] Donita Brooks, MD   2 years ago Pneumonia due to COVID-19 virus   [facility] Medicine Donita Brooks, MD   2 years ago COPD exacerbation [facility])   [facility] Donita Brooks, MD   2 years ago COPD exacerbation [facility])   [facility] Medicine Cathlean Marseilles A, NP              Failed - Lipid Panel in normal  range within the last 12 months    Cholesterol  Date Value Ref Range Status  06/01/2022 190 <200 mg/dL Final   LDL Cholesterol (Calc)  Date Value Ref Range Status  06/01/2022 101 (H) mg/dL (calc) Final    Comment:    Reference range: <100 . Desirable range <100 mg/dL for primary prevention;   <70 mg/dL for patients with CHD or diabetic patients  with > or = 2 CHD risk factors. Marland Kitchen LDL-C is now calculated using the Martin-Hopkins  calculation, which is a validated novel method providing  better accuracy than the Friedewald equation in the  estimation of LDL-C.  Horald Pollen et al. Lenox Ahr. 4098;119(14): 2061-2068  (http://education.[facility].com/faq/FAQ164)    HDL  Date Value Ref Range Status  06/01/2022 69 > OR = 50 mg/dL Final   Triglycerides  Date Value Ref Range Status  06/01/2022 106 <150 mg/dL Final         Passed - Patient is not pregnant

## 2023-07-15 ENCOUNTER — Other Ambulatory Visit: Payer: Self-pay

## 2023-07-15 ENCOUNTER — Telehealth: Payer: Self-pay | Admitting: Family Medicine

## 2023-07-15 DIAGNOSIS — R5383 Other fatigue: Secondary | ICD-10-CM

## 2023-07-15 DIAGNOSIS — E039 Hypothyroidism, unspecified: Secondary | ICD-10-CM

## 2023-07-15 MED ORDER — LEVOTHYROXINE SODIUM 88 MCG PO TABS
88.0000 ug | ORAL_TABLET | Freq: Every day | ORAL | 0 refills | Status: DC
Start: 2023-07-15 — End: 2023-08-06

## 2023-07-15 NOTE — Telephone Encounter (Signed)
Prescription Request  07/15/2023  LOV: 01/08/2023  What is the name of the medication or equipment? levothyroxine (SYNTHROID) 88 MCG tablet   Have you contacted your pharmacy to request a refill? Yes   Which pharmacy would you like this sent to?  [id number] [place], [place] - [address] AT [facility] [address] [place] [address] Phone: [phone] Fax: [phone]    Patient notified that their request is being sent to the clinical staff for review and that they should receive a response within 2 business days.   Please advise at [facility] [phone]

## 2023-07-20 ENCOUNTER — Other Ambulatory Visit: Payer: Self-pay

## 2023-07-20 ENCOUNTER — Telehealth: Payer: Self-pay

## 2023-07-20 NOTE — Telephone Encounter (Signed)
Copied from CRM [id number]. Topic: Clinical - Prescription Issue >> Jul 20, 2023 11:25 AM Roswell Nickel wrote: Reason for CRM Patient calling regarding  these medication Pravaspapin 40 mg 90 days, pantopravola 40 mg 90 days the pharmacy does not have script for them.pharmacy contact info [phone] [facility]

## 2023-07-21 ENCOUNTER — Other Ambulatory Visit: Payer: Self-pay

## 2023-07-21 ENCOUNTER — Ambulatory Visit (HOSPITAL_COMMUNITY): Payer: Medicare HMO

## 2023-07-21 DIAGNOSIS — E785 Hyperlipidemia, unspecified: Secondary | ICD-10-CM

## 2023-07-21 DIAGNOSIS — I209 Angina pectoris, unspecified: Secondary | ICD-10-CM

## 2023-07-21 MED ORDER — PRAVASTATIN SODIUM 40 MG PO TABS
40.0000 mg | ORAL_TABLET | Freq: Every day | ORAL | 0 refills | Status: DC
Start: 2023-07-21 — End: 2023-08-27

## 2023-07-21 MED ORDER — PANTOPRAZOLE SODIUM 40 MG PO TBEC
40.0000 mg | DELAYED_RELEASE_TABLET | Freq: Every morning | ORAL | 0 refills | Status: DC
Start: 2023-07-21 — End: 2023-10-07

## 2023-07-22 ENCOUNTER — Other Ambulatory Visit: Payer: Self-pay | Admitting: Family Medicine

## 2023-07-22 DIAGNOSIS — F339 Major depressive disorder, recurrent, unspecified: Secondary | ICD-10-CM

## 2023-07-23 NOTE — Telephone Encounter (Signed)
Upcoming appointment- 09/03/23 Courtesy RF #45 days Requested Prescriptions  Pending Prescriptions Disp Refills   sertraline (ZOLOFT) 100 MG tablet [Pharmacy Med Name: SERTRALINE TAB 100MG ] 60 tablet 0    Sig: TAKE 1 AND 1/2 TABLETS     (150MG ) DAILY     Psychiatry:  Antidepressants - SSRI - sertraline Failed - 07/22/2023  2:04 AM      Failed - Valid encounter within last 6 months    Recent Outpatient Visits           1 year ago Fatigue, unspecified type   [facility] Medicine Pickard, Priscille Heidelberg, MD   2 years ago Dysuria   [facility] Donita Brooks, MD   2 years ago Pneumonia due to COVID-19 virus   [facility] Donita Brooks, MD   2 years ago COPD exacerbation [facility])   [facility] Donita Brooks, MD   2 years ago COPD exacerbation [facility])   [facility] Valentino Nose, NP       Future Appointments             In 1 month Pickard, Priscille Heidelberg, MD [facility], [facility]            Passed - AST in normal range and within 360 days    AST  Date Value Ref Range Status  01/08/2023 19 10 - 35 U/L Final         Passed - ALT in normal range and within 360 days    ALT  Date Value Ref Range Status  01/08/2023 15 6 - 29 U/L Final         Passed - Completed PHQ-2 or PHQ-9 in the last 360 days

## 2023-07-28 ENCOUNTER — Other Ambulatory Visit: Payer: Self-pay | Admitting: *Deleted

## 2023-07-28 DIAGNOSIS — Z87891 Personal history of nicotine dependence: Secondary | ICD-10-CM

## 2023-07-28 DIAGNOSIS — Z122 Encounter for screening for malignant neoplasm of respiratory organs: Secondary | ICD-10-CM

## 2023-08-02 ENCOUNTER — Ambulatory Visit (HOSPITAL_COMMUNITY): Payer: Medicare HMO

## 2023-08-05 ENCOUNTER — Ambulatory Visit (HOSPITAL_COMMUNITY)
Admission: RE | Admit: 2023-08-05 | Discharge: 2023-08-05 | Disposition: A | Discharge status: Home / Self Care | Payer: Medicare HMO | Source: Ambulatory Visit | Attending: Family Medicine | Admitting: Family Medicine

## 2023-08-05 DIAGNOSIS — Z1231 Encounter for screening mammogram for malignant neoplasm of breast: Secondary | ICD-10-CM | POA: Insufficient documentation

## 2023-08-06 ENCOUNTER — Other Ambulatory Visit: Payer: Self-pay | Admitting: Family Medicine

## 2023-08-06 ENCOUNTER — Other Ambulatory Visit: Payer: Self-pay

## 2023-08-06 DIAGNOSIS — R5383 Other fatigue: Secondary | ICD-10-CM

## 2023-08-06 DIAGNOSIS — E039 Hypothyroidism, unspecified: Secondary | ICD-10-CM

## 2023-08-06 DIAGNOSIS — J439 Emphysema, unspecified: Secondary | ICD-10-CM

## 2023-08-06 MED ORDER — LEVOTHYROXINE SODIUM 88 MCG PO TABS
88.0000 ug | ORAL_TABLET | Freq: Every day | ORAL | 0 refills | Status: DC
Start: 2023-08-06 — End: 2023-09-13

## 2023-08-09 NOTE — Telephone Encounter (Signed)
Requested Prescriptions  Pending Prescriptions Disp Refills   albuterol (VENTOLIN HFA) 108 (90 Base) MCG/ACT inhaler [Pharmacy Med Name: ALBUTEROL HFA (VENTOLIN) INH] 18 each 1    Sig: USE 2 INHALATIONS ORALLY EVERY 6 HOURS AS NEEDED FORWHEEZING OR SHORTNESS OF BREATH     Pulmonology:  Beta Agonists 2 Failed - 08/06/2023 10:20 AM      Failed - Valid encounter within last 12 months    Recent Outpatient Visits           1 year ago Fatigue, unspecified type   [facility] Pickard, Priscille Heidelberg, MD   2 years ago Dysuria   [facility] Donita Brooks, MD   2 years ago Pneumonia due to COVID-19 virus   [facility] Tanya Nones, Priscille Heidelberg, MD   2 years ago COPD exacerbation (HCC)   [facility] Tanya Nones, Priscille Heidelberg, MD   2 years ago COPD exacerbation [facility])   [facility] Valentino Nose, NP       Future Appointments             In 3 weeks Pickard, Priscille Heidelberg, MD [facility], [facility]            Passed - Last BP in normal range    BP Readings from Last 1 Encounters:  02/18/23 122/75         Passed - Last Heart Rate in normal range    Pulse Readings from Last 1 Encounters:  02/18/23 67          Eszopiclone 3 MG TABS [Pharmacy Med Name: ESZOPICLONE 3 MG TABLET] 30 tablet 2    Sig: TAKE 1 TABLET BY MOUTH AT BEDTIME TAKE IMMEDIATELY BEFORE BEDTIME     Not Delegated - Psychiatry:  Anxiolytics/Hypnotics Failed - 08/06/2023 10:20 AM      Failed - This refill cannot be delegated      Failed - Urine Drug Screen completed in last 360 days      Failed - Valid encounter within last 6 months    Recent Outpatient Visits           1 year ago Fatigue, unspecified type   [facility] Donita Brooks, MD   2 years ago Dysuria   [facility] Donita Brooks, MD   2 years ago Pneumonia due to COVID-19 virus   [facility] Medicine Donita Brooks, MD   2 years ago COPD exacerbation [facility])   [facility] Donita Brooks, MD   2 years ago COPD exacerbation [facility])   [facility] Medicine Valentino Nose, NP       Future Appointments             In 3 weeks Pickard, Priscille Heidelberg, MD [facility], [facility]

## 2023-08-09 NOTE — Telephone Encounter (Signed)
Requested medications are due for refill today.  yes  Requested medications are on the active medications list.  yes  Last refill. 05/13/2023 #30 2 rf  Future visit scheduled.   yes  Notes to clinic.  Refill not delegated.    Requested Prescriptions  Pending Prescriptions Disp Refills   Eszopiclone 3 MG TABS [Pharmacy Med Name: ESZOPICLONE 3 MG TABLET] 30 tablet 2    Sig: TAKE 1 TABLET BY MOUTH AT BEDTIME TAKE IMMEDIATELY BEFORE BEDTIME     Not Delegated - Psychiatry:  Anxiolytics/Hypnotics Failed - 08/06/2023 10:20 AM      Failed - This refill cannot be delegated      Failed - Urine Drug Screen completed in last 360 days      Failed - Valid encounter within last 6 months    Recent Outpatient Visits           1 year ago Fatigue, unspecified type   [facility] Donita Brooks, MD   2 years ago Dysuria   [facility] Medicine Donita Brooks, MD   2 years ago Pneumonia due to COVID-19 virus   [facility] Medicine Tanya Nones, Priscille Heidelberg, MD   2 years ago COPD exacerbation (HCC)   [facility] Tanya Nones, Priscille Heidelberg, MD   2 years ago COPD exacerbation [facility])   [facility] Valentino Nose, NP       Future Appointments             In 3 weeks Pickard, Priscille Heidelberg, MD [place], [facility]            Signed Prescriptions Disp Refills   albuterol (VENTOLIN HFA) 108 (90 Base) MCG/ACT inhaler 18 each 1    Sig: USE 2 INHALATIONS ORALLY EVERY 6 HOURS AS NEEDED FORWHEEZING OR SHORTNESS OF BREATH     Pulmonology:  Beta Agonists 2 Failed - 08/06/2023 10:20 AM      Failed - Valid encounter within last 12 months    Recent Outpatient Visits           1 year ago Fatigue, unspecified type   [facility] Pickard, Priscille Heidelberg, MD   2 years ago Dysuria   [facility] Family Medicine Donita Brooks, MD   2 years ago Pneumonia due to COVID-19 virus   [facility]  Donita Brooks, MD   2 years ago COPD exacerbation (HCC)   [facility] Donita Brooks, MD   2 years ago COPD exacerbation [facility])   [facility] Valentino Nose, NP       Future Appointments             In 3 weeks Pickard, Priscille Heidelberg, MD [facility], [facility]            Passed - Last BP in normal range    BP Readings from Last 1 Encounters:  02/18/23 122/75         Passed - Last Heart Rate in normal range    Pulse Readings from Last 1 Encounters:  02/18/23 67

## 2023-08-10 ENCOUNTER — Telehealth: Payer: Self-pay

## 2023-08-10 NOTE — Telephone Encounter (Signed)
Already requested on 08/06/23 in a separate encounter, routed to the office on 08/10/23.

## 2023-08-10 NOTE — Telephone Encounter (Signed)
Copied from CRM [id number]. Topic: Clinical - Medication Refill >> Aug 10, 2023  4:19 PM Adelina Mings wrote: Most Recent Primary Care Visit:  Provider: Lynnea Ferrier T  [facility]  Visit Type: OFFICE VISIT  Date: 01/08/2023  Medication: Eszopiclone 3 MG TABS  Has the patient contacted their pharmacy? Yes (Agent: If no, request that the patient contact the pharmacy for the refill. If patient does not wish to contact the pharmacy document the reason why and proceed with request.) (Agent: If yes, when and what did the pharmacy advise?) Needs appt, scheduled for this month no sooner availability   Is this the correct pharmacy for this prescription? Yes If no, delete pharmacy and type the correct one.  This is the patient's preferred pharmacy:  [id number] [place] [place] - [address] AT [facility] [address] [place] [address] Phone: [phone] Fax: [phone]  [facility] [place], [place] - [facility] AT Portal to Registered [facility] [address] [place] [address] Phone: [phone] Fax: [phone]   Has the prescription been filled recently? No  Is the patient out of the medication? No  Has the patient been seen for an appointment in the last year OR does the patient have an upcoming appointment? Yes  Can we respond through MyChart? No  Agent: Please be advised that Rx refills may take up to 3 business days. We ask that you follow-up with your pharmacy.

## 2023-08-11 ENCOUNTER — Encounter: Payer: Self-pay | Admitting: Acute Care

## 2023-08-11 ENCOUNTER — Ambulatory Visit: Payer: Medicare HMO | Admitting: Acute Care

## 2023-08-11 DIAGNOSIS — F17210 Nicotine dependence, cigarettes, uncomplicated: Secondary | ICD-10-CM | POA: Diagnosis not present

## 2023-08-11 NOTE — Patient Instructions (Signed)

## 2023-08-11 NOTE — Progress Notes (Signed)
Virtual Visit via Telephone Note  I connected with Alexa Orozco on 08/11/23 at 10:00 AM EST by telephone and verified that I am speaking with the correct person using two identifiers.  Location: Patient:  At home Provider: [address], [place], [place], [address]    I discussed the limitations, risks, security and privacy concerns of performing an evaluation and management service by telephone and the availability of in person appointments. I also discussed with the patient that there may be a patient responsible charge related to this service. The patient expressed understanding and agreed to proceed.    Shared Decision Making Visit Lung Cancer Screening Program [phone])   Eligibility: Age 77 y.o. Pack Years Smoking History Calculation 75 pack year smoking history (# packs/per year x # years smoked) Recent History of coughing up blood  no Unexplained weight loss? no ( >Than 15 pounds within the last 6 months ) Prior History Lung / other cancer no (Diagnosis within the last 5 years already requiring surveillance chest CT Scans). Smoking Status Current Smoker Former Smokers: Years since quit:  NA  Quit Date:  NA  Visit Components: Discussion included one or more decision making aids. yes Discussion included risk/benefits of screening. yes Discussion included potential follow up diagnostic testing for abnormal scans. yes Discussion included meaning and risk of over diagnosis. yes Discussion included meaning and risk of False Positives. yes Discussion included meaning of total radiation exposure. yes  Counseling Included: Importance of adherence to annual lung cancer LDCT screening. yes Impact of comorbidities on ability to participate in the program. yes Ability and willingness to under diagnostic treatment. yes  Smoking Cessation Counseling: Current Smokers:  Discussed importance of smoking cessation. yes Information about tobacco cessation classes and interventions  provided to patient. yes Patient provided with "ticket" for LDCT Scan. yes Symptomatic Patient. no  Counseling NA Diagnosis Code: Tobacco Use Z72.0 Asymptomatic Patient yes  Counseling (Intermediate counseling: > three minutes counseling) M8413 Former Smokers:  Discussed the importance of maintaining cigarette abstinence. yes Diagnosis Code: Personal History of Nicotine Dependence. K44.010 Information about tobacco cessation classes and interventions provided to patient. Yes Patient provided with "ticket" for LDCT Scan. yes Written Order for Lung Cancer Screening with LDCT placed in Epic. Yes (CT Chest Lung Cancer Screening Low Dose W/O CM) [id number] Z12.2-Screening of respiratory organs Z87.891-Personal history of nicotine dependence   Bevelyn Ngo, NP 08/11/2023

## 2023-08-12 ENCOUNTER — Other Ambulatory Visit: Payer: Self-pay | Admitting: Family Medicine

## 2023-08-12 NOTE — Telephone Encounter (Signed)
Requested medication (s) are due for refill today:   Yes  Requested medication (s) are on the active medication list:   Yes  Future visit scheduled:   Yes 12/27   LOV 01/08/2023   Last ordered: 08/28/2022 #90, 3 refills  Unable to refill because labs are due.   Has upcoming appt.      Requested Prescriptions  Pending Prescriptions Disp Refills   clopidogrel (PLAVIX) 75 MG tablet [Pharmacy Med Name: CLOPIDOGREL  TAB 75MG ] 90 tablet 3    Sig: TAKE 1 TABLET DAILY     Hematology: Antiplatelets - clopidogrel Failed - 08/12/2023 10:31 AM      Failed - HCT in normal range and within 180 days    HCT  Date Value Ref Range Status  01/08/2023 35.5 35.0 - 45.0 % Final         Failed - HGB in normal range and within 180 days    Hemoglobin  Date Value Ref Range Status  01/08/2023 11.3 (L) 11.7 - 15.5 g/dL Final         Failed - PLT in normal range and within 180 days    Platelets  Date Value Ref Range Status  01/08/2023 358 140 - 400 Thousand/uL Final         Failed - Valid encounter within last 6 months    Recent Outpatient Visits           1 year ago Fatigue, unspecified type   [facility] Donita Brooks, MD   2 years ago Dysuria   [facility] Donita Brooks, MD   2 years ago Pneumonia due to COVID-19 virus   [facility] Donita Brooks, MD   2 years ago COPD exacerbation [facility])   [facility] Donita Brooks, MD   2 years ago COPD exacerbation [facility])   [facility] Valentino Nose, NP       Future Appointments             In 3 weeks Tanya Nones Priscille Heidelberg, MD [facility]            Passed - Cr in normal range and within 360 days    Creat  Date Value Ref Range Status  01/08/2023 0.82 0.60 - 1.00 mg/dL Final

## 2023-08-16 ENCOUNTER — Telehealth: Payer: Self-pay

## 2023-08-16 ENCOUNTER — Other Ambulatory Visit: Payer: Self-pay | Admitting: Family Medicine

## 2023-08-16 ENCOUNTER — Ambulatory Visit (HOSPITAL_COMMUNITY)
Admission: RE | Admit: 2023-08-16 | Discharge: 2023-08-16 | Disposition: A | Discharge status: Home / Self Care | Payer: Medicare HMO | Source: Ambulatory Visit | Attending: Acute Care | Admitting: Acute Care

## 2023-08-16 DIAGNOSIS — Z87891 Personal history of nicotine dependence: Secondary | ICD-10-CM | POA: Insufficient documentation

## 2023-08-16 DIAGNOSIS — Z122 Encounter for screening for malignant neoplasm of respiratory organs: Secondary | ICD-10-CM | POA: Insufficient documentation

## 2023-08-16 MED ORDER — ESZOPICLONE 3 MG PO TABS: 3.0000 mg | ORAL_TABLET | Freq: Every day | ORAL | 2 refills | Status: DC

## 2023-08-16 NOTE — Telephone Encounter (Signed)
Copied from CRM [id number]. Topic: Clinical - Prescription Issue >> Aug 16, 2023  3:13 PM Prudencio Pair wrote: Reason for CRM: Patient calling in regards to refill request for [facility]. Rx refill request has already been sent over. Patient states she is completely out of medication.

## 2023-08-17 ENCOUNTER — Ambulatory Visit (INDEPENDENT_AMBULATORY_CARE_PROVIDER_SITE_OTHER): Payer: Medicare HMO

## 2023-08-17 ENCOUNTER — Ambulatory Visit (INDEPENDENT_AMBULATORY_CARE_PROVIDER_SITE_OTHER): Payer: Medicare HMO | Admitting: Podiatry

## 2023-08-17 ENCOUNTER — Encounter: Payer: Self-pay | Admitting: Podiatry

## 2023-08-17 ENCOUNTER — Other Ambulatory Visit: Payer: Self-pay | Admitting: Family Medicine

## 2023-08-17 DIAGNOSIS — L02611 Cutaneous abscess of right foot: Secondary | ICD-10-CM

## 2023-08-17 DIAGNOSIS — M2041 Other hammer toe(s) (acquired), right foot: Secondary | ICD-10-CM | POA: Diagnosis not present

## 2023-08-17 DIAGNOSIS — D2371 Other benign neoplasm of skin of right lower limb, including hip: Secondary | ICD-10-CM

## 2023-08-17 DIAGNOSIS — M7751 Other enthesopathy of right foot: Secondary | ICD-10-CM | POA: Diagnosis not present

## 2023-08-17 DIAGNOSIS — L03031 Cellulitis of right toe: Secondary | ICD-10-CM

## 2023-08-17 MED ORDER — ESZOPICLONE 3 MG PO TABS: 3.0000 mg | ORAL_TABLET | Freq: Every day | ORAL | 2 refills | Status: DC

## 2023-08-17 MED ORDER — DEXAMETHASONE SODIUM PHOSPHATE 120 MG/30ML IJ SOLN
2.0000 mg | Freq: Once | INTRAMUSCULAR | Status: AC
Start: 2023-08-17 — End: 2023-08-17
  Administered 2023-08-17: 2 mg via INTRA_ARTICULAR

## 2023-08-17 NOTE — Progress Notes (Signed)
Subjective:  Patient ID: Alexa Orozco, female    DOB: [birth year],  MRN: [id number] HPI Chief Complaint  Patient presents with   Toe Pain    5th toe right - corn, inflamed x 2 weeks, tried aloe vera cream, can't wear a shoe, tender and burns   New Patient (Initial Visit)    Est pt 2018    77 y.o. female presents with the above complaint.   ROS: Denies fever chills nausea mobic muscle aches pains calf pain back pain chest pain shortness of breath.  Past Medical History:  Diagnosis Date   Allergies    Allergy    Anxiety    Asthma    Bladder cancer (HCC)    CAD S/P percutaneous coronary angioplasty 10/2005   Class III Angina --> Cath: 85% -- PCI 3.50mm x 18mm (4.0 mm) Cypher DES to RCA; Myoview October 2014: normal stress test: LOW RISK; mild anteroapical breast attenuation   Cataract    h/o bilat repair   Claudication (HCC) 06/01/2013   COPD (chronic obstructive pulmonary disease) (HCC)    Depression 11/29/2013   Emphysema of lung (HCC)    GERD (gastroesophageal reflux disease)    History of palpitations    History of tobacco abuse    Hyperlipidemia    Hypertension    well-controlled   Hypothyroidism    Osteoporosis    Raynaud disease    Past Surgical History:  Procedure Laterality Date   ABDOMINAL HYSTERECTOMY     h/o partila hysterectomy   BREAST BIOPSY  08/31/1986   COLONOSCOPY  2006   Dr. Juanda Chance: multiple hyperplastic polyps   COLONOSCOPY  2001   adenomatous polyps   COLONOSCOPY WITH PROPOFOL N/A 04/24/2022   Procedure: COLONOSCOPY WITH PROPOFOL;  Surgeon: Corbin Ade, MD;  [facility];  Service: Endoscopy;  Laterality: N/A;  1:00pm, asa 3   CORONARY ANGIOPLASTY WITH STENT PLACEMENT  10/14/2005   cypher DES (3.53mmx18mm) to high grade RCA lesion   NM MYOVIEW LTD  July 2012 of October 2014   '12: dipyridamole; Normal, low risk study; 2014 - normal stress test: LOW RISK; mild anteroapical breast attenuation   POLYPECTOMY  04/24/2022   Procedure:  POLYPECTOMY;  Surgeon: Corbin Ade, MD;  Location: [facility];  Service: Endoscopy;;   TONSILLECTOMY  08/31/1957   TRANSTHORACIC ECHOCARDIOGRAM  05/22/2009   EF=>55%, normal LV systolic function; normal RV systolic function; mild MR/TR   TRANSURETHRAL RESECTION OF BLADDER TUMOR N/A 11/08/2020   Procedure: TRANSURETHRAL RESECTION OF BLADDER TUMOR (TURBT);  Surgeon: Sondra Come, MD;  Location: [facility];  Service: Urology;  Laterality: N/A;   TRANSURETHRAL RESECTION OF BLADDER TUMOR N/A 12/13/2020   Procedure: TRANSURETHRAL RESECTION OF BLADDER TUMOR (TURBT);  Surgeon: Sondra Come, MD;  Location: [facility];  Service: Urology;  Laterality: N/A;    Current Outpatient Medications:    acetaminophen (TYLENOL) 500 MG tablet, Take 500-1,000 mg by mouth every 6 (six) hours as needed (pain)., Disp: , Rfl:    albuterol (VENTOLIN HFA) 108 (90 Base) MCG/ACT inhaler, USE 2 INHALATIONS ORALLY EVERY 6 HOURS AS NEEDED FORWHEEZING OR SHORTNESS OF BREATH, Disp: 18 each, Rfl: 1   aspirin EC 81 MG EC tablet, Take 1 tablet (81 mg total) by mouth daily. Swallow whole., Disp: 30 tablet, Rfl: 11   Budeson-Glycopyrrol-Formoterol (BREZTRI AEROSPHERE) 160-9-4.8 MCG/ACT AERO, Inhale 2 puffs into the lungs 2 (two) times daily., Disp: 10.7 g, Rfl: 11   clonazePAM (KLONOPIN) 0.5 MG tablet, TAKE 1 TABLET  BY MOUTH TWICE A DAY AS NEEDED FOR ANXIETY, Disp: 30 tablet, Rfl: 1   clopidogrel (PLAVIX) 75 MG tablet, Take 1 tablet (75 mg total) by mouth daily., Disp: 90 tablet, Rfl: 3   diclofenac Sodium (VOLTAREN) 1 % GEL, Apply 2 g topically 4 (four) times daily as needed (pain)., Disp: 100 g, Rfl: 0   enalapril (VASOTEC) 2.5 MG tablet, Take 2.5 mg by mouth daily., Disp: , Rfl:    Eszopiclone 3 MG TABS, Take 1 tablet (3 mg total) by mouth at bedtime. Take immediately before bedtime, Disp: 30 tablet, Rfl: 2   Fluticasone-Umeclidin-Vilant (TRELEGY ELLIPTA) 100-62.5-25 MCG/ACT AEPB, Inhale 1 puff into the lungs daily., Disp:  1 each, Rfl: 11   GEMTESA 75 MG TABS, Take 1 tablet (75 mg total) by mouth daily., Disp: 30 tablet, Rfl: 11   levothyroxine (SYNTHROID) 88 MCG tablet, Take 1 tablet (88 mcg total) by mouth daily., Disp: 30 tablet, Rfl: 0   linaclotide (LINZESS) 145 MCG CAPS capsule, Take 1 capsule (145 mcg total) by mouth daily before breakfast., Disp: 90 capsule, Rfl: 3   meclizine (ANTIVERT) 25 MG tablet, Take 0.5 tablets (12.5 mg total) by mouth 3 (three) times daily as needed for dizziness., Disp: 30 tablet, Rfl: 0   mirabegron ER (MYRBETRIQ) 50 MG TB24 tablet, Take 1 tablet (50 mg total) by mouth daily., Disp: 90 tablet, Rfl: 2   montelukast (SINGULAIR) 10 MG tablet, TAKE 1 TABLET AT BEDTIMETAKE 1 TABLET AT BEDTIME, Disp: 30 tablet, Rfl: 0   nitroGLYCERIN (NITROSTAT) 0.4 MG SL tablet, Place 1 tablet (0.4 mg total) under the tongue every 5 (five) minutes as needed for chest pain., Disp: 25 tablet, Rfl: 6   pantoprazole (PROTONIX) 40 MG tablet, Take 1 tablet (40 mg total) by mouth every morning. TAKE 1 TABLET EVERY MORNING, Disp: 90 tablet, Rfl: 0   pravastatin (PRAVACHOL) 40 MG tablet, Take 1 tablet (40 mg total) by mouth daily., Disp: 90 tablet, Rfl: 0   sertraline (ZOLOFT) 100 MG tablet, TAKE 1 AND 1/2 TABLETS     (150MG ) DAILY, Disp: 68 tablet, Rfl: 0   Tiotropium Bromide Monohydrate (SPIRIVA RESPIMAT) 2.5 MCG/ACT AERS, , Disp: , Rfl:   Allergies  Allergen Reactions   Bystolic [Nebivolol Hcl] Hives   Cephalexin Hives    Keflex*   Effexor [Venlafaxine] Other (See Comments)    Made her face hot and red   Elemental Sulfur Dermatitis   Levaquin [Levofloxacin In D5w] Other (See Comments)    Caused face to turn red and patient became extremely hot    Review of Systems Objective:  There were no vitals filed for this visit.  General: Well developed, nourished, in no acute distress, alert and oriented x3   Dermatological: Skin is warm, dry and supple bilateral. Nails x 10 are well maintained; remaining  integument appears unremarkable at this time. There are no open sores, no preulcerative lesions, no rash or signs of infection present.  Erythema and abscess overlying the PIPJ fifth digit right foot once incised purulence was noted no odor.  Vascular: Dorsalis Pedis artery and Posterior Tibial artery pedal pulses are 2/4 bilateral with immedate capillary fill time. Pedal hair growth present. No varicosities and no lower extremity edema present bilateral.   Neruologic: Grossly intact via light touch bilateral. Vibratory intact via tuning fork bilateral. Protective threshold with Semmes Wienstein monofilament intact to all pedal sites bilateral. Patellar and Achilles deep tendon reflexes 2+ bilateral. No Babinski or clonus noted bilateral.   Musculoskeletal:  No gross boney pedal deformities bilateral. No pain, crepitus, or limitation noted with foot and ankle range of motion bilateral. Muscular strength 5/5 in all groups tested bilateral.  Gait: Unassisted, Nonantalgic.    Radiographs:  Radiographs taken today demonstrate an osseously mature individual with generalized demineralization of the bone.  Adductovarus rotated hammertoe deformity fifth right.  Does not demonstrate any type of bone infection and no gas in the tissues.  Adductovarus rotated hammertoe deformity fifth right  Assessment & Plan:   Assessment: Adductovarus rotated hammertoe deformity fifth right with an abscess overlying the PIPJ and surrounding erythema and fluctuance.  Plan: Injected a small amount of dexamethasone local anesthetic just proximal to the lesion.  I debrided the lesion opening and draining it of purulence.  No malodor.  Open the wound so it can drain for period of time.  Placed dressed a compressive dressing.  Started her on Keflex 500 mg 1 p.o. twice daily I will see her in a couple of weeks if necessary.  I did instruct her to soak Epsom salts and warm water.     Tavin Vernet T. [place], [place]

## 2023-08-20 ENCOUNTER — Telehealth: Payer: Self-pay

## 2023-08-20 ENCOUNTER — Other Ambulatory Visit: Payer: Self-pay

## 2023-08-20 NOTE — Telephone Encounter (Signed)
Patient called stating that her antibiotic has not been sent to pharmacy. When trying to send to pharmacy allergy to keflex came up.

## 2023-08-23 ENCOUNTER — Other Ambulatory Visit: Payer: Self-pay

## 2023-08-23 ENCOUNTER — Other Ambulatory Visit: Payer: Medicare HMO

## 2023-08-23 DIAGNOSIS — E039 Hypothyroidism, unspecified: Secondary | ICD-10-CM

## 2023-08-23 DIAGNOSIS — Z122 Encounter for screening for malignant neoplasm of respiratory organs: Secondary | ICD-10-CM

## 2023-08-23 DIAGNOSIS — E785 Hyperlipidemia, unspecified: Secondary | ICD-10-CM

## 2023-08-23 DIAGNOSIS — I10 Essential (primary) hypertension: Secondary | ICD-10-CM

## 2023-08-23 DIAGNOSIS — F17210 Nicotine dependence, cigarettes, uncomplicated: Secondary | ICD-10-CM

## 2023-08-23 DIAGNOSIS — Z87891 Personal history of nicotine dependence: Secondary | ICD-10-CM

## 2023-08-23 DIAGNOSIS — R5383 Other fatigue: Secondary | ICD-10-CM

## 2023-08-24 LAB — CBC WITH DIFFERENTIAL/PLATELET
Absolute Lymphocytes: 2789 {cells}/uL (ref 850–3900)
Absolute Monocytes: 661 {cells}/uL (ref 200–950)
Basophils Absolute: 53 {cells}/uL (ref 0–200)
Basophils Relative: 0.7 %
Eosinophils Absolute: 380 {cells}/uL (ref 15–500)
Eosinophils Relative: 5 %
HCT: 40.9 % (ref 35.0–45.0)
Hemoglobin: 13.4 g/dL (ref 11.7–15.5)
MCH: 30 pg (ref 27.0–33.0)
MCHC: 32.8 g/dL (ref 32.0–36.0)
MCV: 91.7 fL (ref 80.0–100.0)
MPV: 10.1 fL (ref 7.5–12.5)
Monocytes Relative: 8.7 %
Neutro Abs: 3716 {cells}/uL (ref 1500–7800)
Neutrophils Relative %: 48.9 %
Platelets: 352 10*3/uL (ref 140–400)
RBC: 4.46 10*6/uL (ref 3.80–5.10)
RDW: 14.6 % (ref 11.0–15.0)
Total Lymphocyte: 36.7 %
WBC: 7.6 10*3/uL (ref 3.8–10.8)

## 2023-08-24 LAB — COMPLETE METABOLIC PANEL WITH GFR
AG Ratio: 1.3 (calc) (ref 1.0–2.5)
ALT: 17 U/L (ref 6–29)
AST: 20 U/L (ref 10–35)
Albumin: 4.2 g/dL (ref 3.6–5.1)
Alkaline phosphatase (APISO): 94 U/L (ref 37–153)
BUN: 17 mg/dL (ref 7–25)
CO2: 29 mmol/L (ref 20–32)
Calcium: 9.6 mg/dL (ref 8.6–10.4)
Chloride: 101 mmol/L (ref 98–110)
Creat: 0.81 mg/dL (ref 0.60–1.00)
Globulin: 3.3 g/dL (ref 1.9–3.7)
Glucose, Bld: 90 mg/dL (ref 65–99)
Potassium: 4.9 mmol/L (ref 3.5–5.3)
Sodium: 137 mmol/L (ref 135–146)
Total Bilirubin: 0.4 mg/dL (ref 0.2–1.2)
Total Protein: 7.5 g/dL (ref 6.1–8.1)
eGFR: 76 mL/min/{1.73_m2} (ref >=60)

## 2023-08-24 LAB — LIPID PANEL
Cholesterol: 225 mg/dL — ABNORMAL HIGH (ref <200)
HDL: 87 mg/dL (ref >=50)
LDL Cholesterol (Calc): 115 mg/dL — ABNORMAL HIGH
Non-HDL Cholesterol (Calc): 138 mg/dL — ABNORMAL HIGH (ref <130)
Total CHOL/HDL Ratio: 2.6 (calc) (ref <5.0)
Triglycerides: 119 mg/dL (ref <150)

## 2023-08-24 LAB — TSH: TSH: 1.29 m[IU]/L (ref 0.40–4.50)

## 2023-08-27 ENCOUNTER — Ambulatory Visit (INDEPENDENT_AMBULATORY_CARE_PROVIDER_SITE_OTHER): Payer: Medicare HMO | Admitting: Family Medicine

## 2023-08-27 ENCOUNTER — Encounter: Payer: Self-pay | Admitting: Family Medicine

## 2023-08-27 VITALS — BP 122/80 | HR 63 | Temp 97.9°F | Ht 62.0 in | Wt 159.5 lb

## 2023-08-27 DIAGNOSIS — E039 Hypothyroidism, unspecified: Secondary | ICD-10-CM

## 2023-08-27 DIAGNOSIS — I10 Essential (primary) hypertension: Secondary | ICD-10-CM

## 2023-08-27 DIAGNOSIS — I2510 Atherosclerotic heart disease of native coronary artery without angina pectoris: Secondary | ICD-10-CM | POA: Diagnosis not present

## 2023-08-27 MED ORDER — LORAZEPAM 0.5 MG PO TABS: 0.5000 mg | ORAL_TABLET | Freq: Two times a day (BID) | ORAL | 1 refills | Status: DC | PRN

## 2023-08-27 MED ORDER — ROSUVASTATIN CALCIUM 20 MG PO TABS: 20.0000 mg | ORAL_TABLET | Freq: Every day | ORAL | 3 refills | Status: AC

## 2023-08-27 NOTE — Progress Notes (Signed)
Subjective:    Patient ID: Alexa Orozco, female    DOB: [birth year], 77 y.o.   MRN: [id number]  HPI Patient is a 77 year old Caucasian female with a history of coronary artery disease.  She continues to smoke.  She denies any chest pain or shortness of breath or dyspnea on exertion.  She denies any hemoptysis.  Her blood pressure today is well-controlled.  She does request a refill on Ativan.  She states that she uses this sparingly to help her sleep.  She continues to take levothyroxine for hypothyroidism.  Her most recent TSH is shown below and is within normal range.  She has had her flu shot and the new pneumonia vaccine. Lab on 08/23/2023  Component Date Value Ref Range Status   WBC 08/23/2023 7.6  3.8 - 10.8 Thousand/uL Final   RBC 08/23/2023 4.46  3.80 - 5.10 Million/uL Final   Hemoglobin 08/23/2023 13.4  11.7 - 15.5 g/dL Final   HCT 57/84/6962 40.9  35.0 - 45.0 % Final   MCV 08/23/2023 91.7  80.0 - 100.0 fL Final   MCH 08/23/2023 30.0  27.0 - 33.0 pg Final   MCHC 08/23/2023 32.8  32.0 - 36.0 g/dL Final   Comment: For adults, a slight decrease in the calculated MCHC value (in the range of 30 to 32 g/dL) is most likely not clinically significant; however, it should be interpreted with caution in correlation with other red cell parameters and the patient's clinical condition.    RDW 08/23/2023 14.6  11.0 - 15.0 % Final   Platelets 08/23/2023 352  140 - 400 Thousand/uL Final   MPV 08/23/2023 10.1  7.5 - 12.5 fL Final   Neutro Abs 08/23/2023 3,716  1,500 - 7,800 cells/uL Final   Absolute Lymphocytes 08/23/2023 2,789  850 - 3,900 cells/uL Final   Absolute Monocytes 08/23/2023 661  200 - 950 cells/uL Final   Eosinophils Absolute 08/23/2023 380  15 - 500 cells/uL Final   Basophils Absolute 08/23/2023 53  0 - 200 cells/uL Final   Neutrophils Relative % 08/23/2023 48.9  % Final   Total Lymphocyte 08/23/2023 36.7  % Final   Monocytes Relative 08/23/2023 8.7  % Final   Eosinophils Relative  08/23/2023 5.0  % Final   Basophils Relative 08/23/2023 0.7  % Final   Glucose, Bld 08/23/2023 90  65 - 99 mg/dL Final   Comment: .            Fasting reference interval .    BUN 08/23/2023 17  7 - 25 mg/dL Final   Creat 95/28/4132 0.81  0.60 - 1.00 mg/dL Final   eGFR 44/08/270 76  > OR = 60 mL/min/1.65m2 Final   BUN/Creatinine Ratio 08/23/2023 SEE NOTE:  6 - 22 (calc) Final   Comment:    Not Reported: BUN and Creatinine are within    reference range. .    Sodium 08/23/2023 137  135 - 146 mmol/L Final   Potassium 08/23/2023 4.9  3.5 - 5.3 mmol/L Final   Chloride 08/23/2023 101  98 - 110 mmol/L Final   CO2 08/23/2023 29  20 - 32 mmol/L Final   Calcium 08/23/2023 9.6  8.6 - 10.4 mg/dL Final   Total Protein 53/66/4403 7.5  6.1 - 8.1 g/dL Final   Albumin 47/42/5956 4.2  3.6 - 5.1 g/dL Final   Globulin 38/75/6433 3.3  1.9 - 3.7 g/dL (calc) Final   AG Ratio 08/23/2023 1.3  1.0 - 2.5 (calc) Final   Total Bilirubin 08/23/2023  0.4  0.2 - 1.2 mg/dL Final   Alkaline phosphatase (APISO) 08/23/2023 94  37 - 153 U/L Final   AST 08/23/2023 20  10 - 35 U/L Final   ALT 08/23/2023 17  6 - 29 U/L Final   Cholesterol 08/23/2023 225 (H)  <200 mg/dL Final   HDL 16/06/9603 87  > OR = 50 mg/dL Final   Triglycerides 54/05/8118 119  <150 mg/dL Final   LDL Cholesterol (Calc) 08/23/2023 115 (H)  mg/dL (calc) Final   Comment: Reference range: <100 . Desirable range <100 mg/dL for primary prevention;   <70 mg/dL for patients with CHD or diabetic patients  with > or = 2 CHD risk factors. Marland Kitchen LDL-C is now calculated using the Martin-Hopkins  calculation, which is a validated novel method providing  better accuracy than the Friedewald equation in the  estimation of LDL-C.  Horald Pollen et al. Lenox Ahr. 1478;295(62): 2061-2068  (http://education.[facility].com/faq/FAQ164)    Total CHOL/HDL Ratio 08/23/2023 2.6  <1.3 (calc) Final   Non-HDL Cholesterol (Calc) 08/23/2023 138 (H)  <130 mg/dL (calc) Final    Comment: For patients with diabetes plus 1 major ASCVD risk  factor, treating to a non-HDL-C goal of <100 mg/dL  (LDL-C of <08 mg/dL) is considered a therapeutic  option.    TSH 08/23/2023 1.29  0.40 - 4.50 mIU/L Final    Past Medical History:  Diagnosis Date   Allergies    Allergy    Anxiety    Asthma    Bladder cancer (HCC)    CAD S/P percutaneous coronary angioplasty 10/2005   Class III Angina --> Cath: 85% -- PCI 3.57mm x 18mm (4.0 mm) Cypher DES to RCA; Myoview October 2014: normal stress test: LOW RISK; mild anteroapical breast attenuation   Cataract    h/o bilat repair   Claudication (HCC) 06/01/2013   COPD (chronic obstructive pulmonary disease) (HCC)    Depression 11/29/2013   Emphysema of lung (HCC)    GERD (gastroesophageal reflux disease)    History of palpitations    History of tobacco abuse    Hyperlipidemia    Hypertension    well-controlled   Hypothyroidism    Osteoporosis    Raynaud disease    Past Surgical History:  Procedure Laterality Date   ABDOMINAL HYSTERECTOMY     h/o partila hysterectomy   BREAST BIOPSY  08/31/1986   COLONOSCOPY  2006   Dr. Juanda Chance: multiple hyperplastic polyps   COLONOSCOPY  2001   adenomatous polyps   COLONOSCOPY WITH PROPOFOL N/A 04/24/2022   Procedure: COLONOSCOPY WITH PROPOFOL;  Surgeon: Corbin Ade, MD;  [facility];  Service: Endoscopy;  Laterality: N/A;  1:00pm, asa 3   CORONARY ANGIOPLASTY WITH STENT PLACEMENT  10/14/2005   cypher DES (3.37mmx18mm) to high grade RCA lesion   NM MYOVIEW LTD  July 2012 of October 2014   '12: dipyridamole; Normal, low risk study; 2014 - normal stress test: LOW RISK; mild anteroapical breast attenuation   POLYPECTOMY  04/24/2022   Procedure: POLYPECTOMY;  Surgeon: Corbin Ade, MD;  [facility];  Service: Endoscopy;;   TONSILLECTOMY  08/31/1957   TRANSTHORACIC ECHOCARDIOGRAM  05/22/2009   EF=>55%, normal LV systolic function; normal RV systolic function; mild  MR/TR   TRANSURETHRAL RESECTION OF BLADDER TUMOR N/A 11/08/2020   Procedure: TRANSURETHRAL RESECTION OF BLADDER TUMOR (TURBT);  Surgeon: Sondra Come, MD;  Location: [facility];  Service: Urology;  Laterality: N/A;   TRANSURETHRAL RESECTION OF BLADDER TUMOR N/A 12/13/2020  Procedure: TRANSURETHRAL RESECTION OF BLADDER TUMOR (TURBT);  Surgeon: Sondra Come, MD;  Location: [facility];  Service: Urology;  Laterality: N/A;   Current Outpatient Medications on File Prior to Visit  Medication Sig Dispense Refill   acetaminophen (TYLENOL) 500 MG tablet Take 500-1,000 mg by mouth every 6 (six) hours as needed (pain).     albuterol (VENTOLIN HFA) 108 (90 Base) MCG/ACT inhaler USE 2 INHALATIONS ORALLY EVERY 6 HOURS AS NEEDED FORWHEEZING OR SHORTNESS OF BREATH 18 each 1   aspirin EC 81 MG EC tablet Take 1 tablet (81 mg total) by mouth daily. Swallow whole. 30 tablet 11   Budeson-Glycopyrrol-Formoterol (BREZTRI AEROSPHERE) 160-9-4.8 MCG/ACT AERO Inhale 2 puffs into the lungs 2 (two) times daily. 10.7 g 11   clonazePAM (KLONOPIN) 0.5 MG tablet TAKE 1 TABLET BY MOUTH TWICE A DAY AS NEEDED FOR ANXIETY 30 tablet 1   clopidogrel (PLAVIX) 75 MG tablet Take 1 tablet (75 mg total) by mouth daily. 90 tablet 3   diclofenac Sodium (VOLTAREN) 1 % GEL Apply 2 g topically 4 (four) times daily as needed (pain). 100 g 0   enalapril (VASOTEC) 2.5 MG tablet Take 2.5 mg by mouth daily.     Eszopiclone 3 MG TABS Take 1 tablet (3 mg total) by mouth at bedtime. Take immediately before bedtime 30 tablet 2   Fluticasone-Umeclidin-Vilant (TRELEGY ELLIPTA) 100-62.5-25 MCG/ACT AEPB Inhale 1 puff into the lungs daily. 1 each 11   GEMTESA 75 MG TABS Take 1 tablet (75 mg total) by mouth daily. 30 tablet 11   levothyroxine (SYNTHROID) 88 MCG tablet Take 1 tablet (88 mcg total) by mouth daily. 30 tablet 0   linaclotide (LINZESS) 145 MCG CAPS capsule Take 1 capsule (145 mcg total) by mouth daily before breakfast. 90 capsule 3    meclizine (ANTIVERT) 25 MG tablet Take 0.5 tablets (12.5 mg total) by mouth 3 (three) times daily as needed for dizziness. 30 tablet 0   mirabegron ER (MYRBETRIQ) 50 MG TB24 tablet Take 1 tablet (50 mg total) by mouth daily. 90 tablet 2   montelukast (SINGULAIR) 10 MG tablet TAKE 1 TABLET AT BEDTIMETAKE 1 TABLET AT BEDTIME 30 tablet 0   nitroGLYCERIN (NITROSTAT) 0.4 MG SL tablet Place 1 tablet (0.4 mg total) under the tongue every 5 (five) minutes as needed for chest pain. 25 tablet 6   pantoprazole (PROTONIX) 40 MG tablet Take 1 tablet (40 mg total) by mouth every morning. TAKE 1 TABLET EVERY MORNING 90 tablet 0   pravastatin (PRAVACHOL) 40 MG tablet Take 1 tablet (40 mg total) by mouth daily. 90 tablet 0   sertraline (ZOLOFT) 100 MG tablet TAKE 1 AND 1/2 TABLETS     (150MG ) DAILY 68 tablet 0   Tiotropium Bromide Monohydrate (SPIRIVA RESPIMAT) 2.5 MCG/ACT AERS      No current facility-administered medications on file prior to visit.   Allergies  Allergen Reactions   Bystolic [Nebivolol Hcl] Hives   Cephalexin Hives    Keflex*   Effexor [Venlafaxine] Other (See Comments)    Made her face hot and red   Elemental Sulfur Dermatitis   Levaquin [Levofloxacin In D5w] Other (See Comments)    Caused face to turn red and patient became extremely hot    Social History   Socioeconomic History   Marital status: Widowed    Spouse name: Not on file   Number of children: 3   Years of education: 10th    Highest education level: Not on file  Occupational  History   Occupation: Retired  Tobacco Use   Smoking status: Every Day    Current packs/day: 0.50    Average packs/day: 0.5 packs/day for 35.0 years (17.5 ttl pk-yrs)    Types: Cigarettes    Passive exposure: Never   Smokeless tobacco: Never  Vaping Use   Vaping status: Never Used  Substance and Sexual Activity   Alcohol use: No   Drug use: No   Sexual activity: Not on file  Other Topics Concern   Not on file  Social History Narrative    She is a widowed mother of 3, grandmother for, great-grandmother 1.  She still smokes about half pack a day.  She doesn't have about 35 years.  She does not drink.   Prior to her symptoms coming on, she used to do all kind of walking around and working in the garden and other activities with her close friend.   Social Drivers of Corporate investment banker Strain: Low Risk  (01/23/2022)   Overall Financial Resource Strain (CARDIA)    Difficulty of Paying Living Expenses: Not very hard  Food Insecurity: No Food Insecurity (01/23/2022)   Hunger Vital Sign    Worried About Running Out of Food in the Last Year: Never true    Ran Out of Food in the Last Year: Never true  Transportation Needs: No Transportation Needs (01/23/2022)   PRAPARE - Administrator, Civil Service (Medical): No    Lack of Transportation (Non-Medical): No  Physical Activity: Insufficiently Active (01/23/2022)   Exercise Vital Sign    Days of Exercise per Week: 3 days    Minutes of Exercise per Session: 30 min  Stress: No Stress Concern Present (01/23/2022)   [employer] of Occupational Health - Occupational Stress Questionnaire    Feeling of Stress : Not at all  Social Connections: Moderately Integrated (01/23/2022)   Social Connection and Isolation Panel [NHANES]    Frequency of Communication with Friends and Family: More than three times a week    Frequency of Social Gatherings with Friends and Family: More than three times a week    Attends Religious Services: More than 4 times per year    Active Member of [employer] or Organizations: Yes    Attends Banker Meetings: More than 4 times per year    Marital Status: Widowed  Intimate Partner Violence: Not At Risk (01/23/2022)   Humiliation, Afraid, Rape, and Kick questionnaire    Fear of Current or Ex-Partner: No    Emotionally Abused: No    Physically Abused: No    Sexually Abused: No      Review of Systems  All other systems reviewed  and are negative.      Objective:   Physical Exam Vitals reviewed.  Constitutional:      General: She is not in acute distress.    Appearance: Normal appearance. She is normal weight. She is not ill-appearing or toxic-appearing.  HENT:     Head: Normocephalic and atraumatic.  Cardiovascular:     Rate and Rhythm: Normal rate and regular rhythm.     Pulses:          Dorsalis pedis pulses are 2+ on the right side and 2+ on the left side.       Posterior tibial pulses are 2+ on the right side and 2+ on the left side.     Heart sounds: Normal heart sounds.  Pulmonary:     Effort: Pulmonary effort  is normal.     Breath sounds: Decreased air movement present. Decreased breath sounds and wheezing present. No rhonchi or rales.  Abdominal:     General: Bowel sounds are normal.     Palpations: Abdomen is soft.     Tenderness: There is no abdominal tenderness.  Lymphadenopathy:     Cervical: No cervical adenopathy.  Neurological:     Mental Status: She is alert.         Assessment & Plan:  Essential hypertension  Coronary artery disease involving native coronary artery of native heart without angina pectoris  Hypothyroidism, unspecified type Continue to encourage the patient to quit smoking.  She is in the precontemplative phase.  Her immunizations are up-to-date.  Her blood pressure today is acceptable at 122/80.  However I like to see her LDL cholesterol well below 70.  Discontinue pravastatin and replace with Crestor 20 mg a day and recheck in 3 months.  I did refill her Ativan which she uses sparingly.  TSH is excellent.  Continue levothyroxine at current dose.

## 2023-08-30 ENCOUNTER — Other Ambulatory Visit: Payer: Self-pay | Admitting: Family Medicine

## 2023-08-30 DIAGNOSIS — F339 Major depressive disorder, recurrent, unspecified: Secondary | ICD-10-CM

## 2023-08-30 NOTE — Telephone Encounter (Signed)
Copied from CRM [id number]. Topic: Clinical - Medication Refill >> Aug 30, 2023 10:05 AM [name] wrote: Most Recent Primary Care Visit:  Provider: Lynnea Ferrier T  Department: [facility]  Visit Type: OFFICE VISIT  Date: 08/27/2023  Medication: clopidogrel (PLAVIX) 75 MG tablet [[id number]]  Has the patient contacted their pharmacy? No (Agent: If no, request that the patient contact the pharmacy for the refill. If patient does not wish to contact the pharmacy document the reason why and proceed with request.) (Agent: If yes, when and what did the pharmacy advise?)  Reference number [id number] for pharmacy   Is this the correct pharmacy for this prescription? Yes If no, delete pharmacy and type the correct one.  This is the patient's preferred pharmacy:    [facility] - [place], [place] - [address] [facility] AT Portal to Registered Care[facility] Sites [address] [place] [address] Phone: [phone] Fax: [phone]   Has the prescription been filled recently? No  Is the patient out of the medication? Yes  Has the patient been seen for an appointment in the last year OR does the patient have an upcoming appointment? Yes  Can we respond through MyChart? Yes  Agent: Please be advised that Rx refills may take up to 3 business days. We ask that you follow-up with your pharmacy.

## 2023-08-31 ENCOUNTER — Other Ambulatory Visit: Payer: Medicare HMO

## 2023-09-02 ENCOUNTER — Other Ambulatory Visit: Payer: Self-pay | Admitting: Urology

## 2023-09-03 ENCOUNTER — Encounter: Payer: Medicare HMO | Admitting: Family Medicine

## 2023-09-11 ENCOUNTER — Other Ambulatory Visit: Payer: Self-pay | Admitting: Family Medicine

## 2023-09-13 ENCOUNTER — Other Ambulatory Visit: Payer: Self-pay | Admitting: Family Medicine

## 2023-09-13 DIAGNOSIS — R5383 Other fatigue: Secondary | ICD-10-CM

## 2023-09-13 DIAGNOSIS — E039 Hypothyroidism, unspecified: Secondary | ICD-10-CM

## 2023-09-13 NOTE — Telephone Encounter (Signed)
 Prescription Request  09/13/2023  LOV: 08/27/2023  What is the name of the medication or equipment?   levothyroxine  (SYNTHROID ) 88 MCG tablet   clopidogrel  (PLAVIX ) 75 MG tablet  **90 day refills requested**  Have you contacted your pharmacy to request a refill? Yes   Which pharmacy would you like this sent to?  [id number] [place], [place] - [address] AT [facility] [address] [place] [address] Phone: [phone] Fax: [phone]    Patient notified that their request is being sent to the clinical staff for review and that they should receive a response within 2 business days.   Please advise pharmacist.

## 2023-09-14 MED ORDER — LEVOTHYROXINE SODIUM 88 MCG PO TABS: 88.0000 ug | ORAL_TABLET | Freq: Every day | ORAL | 1 refills | Status: DC

## 2023-09-14 MED ORDER — CLOPIDOGREL BISULFATE 75 MG PO TABS: 75.0000 mg | ORAL_TABLET | Freq: Every day | ORAL | 3 refills | Status: AC

## 2023-09-14 NOTE — Telephone Encounter (Signed)
 Requested Prescriptions  Pending Prescriptions Disp Refills   diclofenac  Sodium (VOLTAREN ) 1 % GEL [Pharmacy Med Name: DICLOFENAC  SODIUM 1% GEL] 100 g 1    Sig: APPLY 2 G TOPICALLY 4 (FOUR) TIMES DAILY AS NEEDED (PAIN).     Analgesics:  Topicals Failed - 09/14/2023  8:30 AM      Failed - Manual Review: Labs are only required if the patient has taken medication for more than 8 weeks.      Failed - Valid encounter within last 12 months    Recent Outpatient Visits           1 year ago Fatigue, unspecified type   [facility] Pickard, Butler DASEN, MD   2 years ago Dysuria   [facility] Duanne Butler DASEN, MD   2 years ago Pneumonia due to COVID-19 virus   [facility] Duanne Butler DASEN, MD   2 years ago COPD exacerbation (HCC)   [facility] Duanne Butler DASEN, MD   2 years ago COPD exacerbation [facility])   [facility] Chandra Harlene LABOR, NP              Passed - PLT in normal range and within 360 days    Platelets  Date Value Ref Range Status  08/23/2023 352 140 - 400 Thousand/uL Final         Passed - HGB in normal range and within 360 days    Hemoglobin  Date Value Ref Range Status  08/23/2023 13.4 11.7 - 15.5 g/dL Final         Passed - HCT in normal range and within 360 days    HCT  Date Value Ref Range Status  08/23/2023 40.9 35.0 - 45.0 % Final         Passed - Cr in normal range and within 360 days    Creat  Date Value Ref Range Status  08/23/2023 0.81 0.60 - 1.00 mg/dL Final         Passed - eGFR is 30 or above and within 360 days    GFR, Est African American  Date Value Ref Range Status  02/06/2021 67 > OR = 60 mL/min/1.15m2 Final   GFR, Est Non African American  Date Value Ref Range Status  02/06/2021 58 (L) > OR = 60 mL/min/1.38m2 Final   GFR, Estimated  Date Value Ref Range Status  05/14/2021 >60 >60 mL/min Final    Comment:    (NOTE) Calculated using the CKD-EPI  Creatinine Equation (2021)    eGFR  Date Value Ref Range Status  08/23/2023 76 > OR = 60 mL/min/1.59m2 Final         Passed - Patient is not pregnant

## 2023-09-14 NOTE — Telephone Encounter (Signed)
 Requested Prescriptions  Pending Prescriptions Disp Refills   clopidogrel  (PLAVIX ) 75 MG tablet 90 tablet 3    Sig: Take 1 tablet (75 mg total) by mouth daily.     Hematology: Antiplatelets - clopidogrel  Failed - 09/14/2023  3:17 PM      Failed - Valid encounter within last 6 months    Recent Outpatient Visits           1 year ago Fatigue, unspecified type   [facility] Pickard, Butler DASEN, MD   2 years ago Dysuria   [facility] Duanne Butler DASEN, MD   2 years ago Pneumonia due to COVID-19 virus   [facility] Duanne Butler DASEN, MD   2 years ago COPD exacerbation (HCC)   [facility] Duanne Butler DASEN, MD   2 years ago COPD exacerbation [facility])   [facility] Chandra Harlene LABOR, NP              Passed - HCT in normal range and within 180 days    HCT  Date Value Ref Range Status  08/23/2023 40.9 35.0 - 45.0 % Final         Passed - HGB in normal range and within 180 days    Hemoglobin  Date Value Ref Range Status  08/23/2023 13.4 11.7 - 15.5 g/dL Final         Passed - PLT in normal range and within 180 days    Platelets  Date Value Ref Range Status  08/23/2023 352 140 - 400 Thousand/uL Final         Passed - Cr in normal range and within 360 days    Creat  Date Value Ref Range Status  08/23/2023 0.81 0.60 - 1.00 mg/dL Final          levothyroxine  (SYNTHROID ) 88 MCG tablet 90 tablet 1    Sig: Take 1 tablet (88 mcg total) by mouth daily.     Endocrinology:  Hypothyroid Agents Failed - 09/14/2023  3:17 PM      Failed - Valid encounter within last 12 months    Recent Outpatient Visits           1 year ago Fatigue, unspecified type   [facility] Pickard, Butler DASEN, MD   2 years ago Dysuria   [facility] Duanne Butler DASEN, MD   2 years ago Pneumonia due to COVID-19 virus   [facility] Duanne Butler DASEN, MD   2 years ago COPD  exacerbation [facility])   [facility] Duanne Butler DASEN, MD   2 years ago COPD exacerbation [facility])   [facility] Chandra Harlene LABOR, NP              Passed - TSH in normal range and within 360 days    TSH  Date Value Ref Range Status  08/23/2023 1.29 0.40 - 4.50 mIU/L Final

## 2023-09-23 ENCOUNTER — Ambulatory Visit: Payer: Medicare HMO | Admitting: Urology

## 2023-09-23 VITALS — BP 161/67 | HR 57 | Ht 62.0 in | Wt 159.0 lb

## 2023-09-23 DIAGNOSIS — Z8551 Personal history of malignant neoplasm of bladder: Secondary | ICD-10-CM

## 2023-09-23 DIAGNOSIS — C679 Malignant neoplasm of bladder, unspecified: Secondary | ICD-10-CM

## 2023-09-23 NOTE — Progress Notes (Signed)
Bladder cancer surveillance note     UROLOGIC HISTORY Alexa Orozco is a 78 y.o. female who originally presented with gross hematuria and on CT was found to have a large 7+ cm bladder tumor at the right lateral wall, posterior wall, and anteriorly.   Initial Diagnosis of Bladder  Year: 10/2020 Pathology: High-grade T1 urothelial cell carcinoma   11/2020: Second look TURBT-pathology showing high-grade T1, muscle present and not involved     Treatments for Bladder Cancer Initial TURBT 10/2020 TURBT second look 11/2020 Induction BCG x6 completed July 2022 mBCG #1 05/2021 mBCG #2 10/2021 mBCG#3 05/2022 mBCG#4 12/2022   AUA Risk Category High   Cystoscopy Procedure Note:   After informed consent and discussion of the procedure and its risks, Alexa Orozco was positioned and prepped in the standard fashion. Cystoscopy was performed with the a flexible cystoscope. The urethra, bladder neck and entire bladder was visualized in a standard fashion.  Bladder mucosa grossly normal throughout, no tumor seen.  No abnormalities on retroflexion.  Extensive scar at right side of the bladder.  She continues to smoke, but has cut back somewhat.  Smoking cessation was discussed again, and we reviewed this is the highest risk factor for bladder cancer recurrence.  Currently on Myrbetriq for OAB symptoms with good results  CT urogram 12/21/2022 reviewed, no evidence of recurrence  -Maintenance BCG ASAP x 3(missed mBCG scheduled for November 2024 for unclear reasons), plan 3 years of maintenance BCG total(finish ~October 2025) -Cystoscopy in 6 months    Legrand Rams, MD 09/23/2023

## 2023-10-04 ENCOUNTER — Telehealth: Payer: Self-pay | Admitting: *Deleted

## 2023-10-04 ENCOUNTER — Telehealth: Payer: Self-pay | Admitting: Pharmacy Technician

## 2023-10-04 NOTE — Telephone Encounter (Addendum)
Marine Lezotte [id number] [birth year]   Provider- Richardo Hanks    Procedure PPIR:[id number] No auth needed.  Drug Code:J9030  No Auth needed    Urothelial Carcinoma of Bladder -  C67.9  Expected date of instillation __________02/2025______    Below to be completed by staff member contacting insurance.   BCG verification completed- Yes  Pa needed- No  Insurance contacted - Pa complete        Ref number:_______[id number]______________  Approval dates : _______2025___________   Denied:_________________________    BCG scheduled:_____________yes_________________________   Pt aware and instructions given.   JQ aware to order Bcg.  Date:

## 2023-10-04 NOTE — Telephone Encounter (Signed)
Auth Submission: NO AUTH NEEDED Site of care: [facility] Payer: [employer] MEDICARE Medication & CPT/J Code(s) submitted: BCG  Route of submission (phone, fax, portal): PORTAL Phone # Fax # Auth type: Buy/Bill PB Units/visits requested:  Reference number:  Approval from: 10/04/23 to 08/30/24

## 2023-10-06 ENCOUNTER — Other Ambulatory Visit: Payer: Self-pay | Admitting: Urology

## 2023-10-07 ENCOUNTER — Other Ambulatory Visit: Payer: Self-pay | Admitting: Family Medicine

## 2023-10-07 DIAGNOSIS — I209 Angina pectoris, unspecified: Secondary | ICD-10-CM

## 2023-10-07 MED ORDER — PANTOPRAZOLE SODIUM 40 MG PO TBEC: 40.0000 mg | DELAYED_RELEASE_TABLET | Freq: Every morning | ORAL | 0 refills | Status: DC

## 2023-10-07 NOTE — Telephone Encounter (Signed)
 Copied from CRM [id number]. Topic: Clinical - Medication Refill >> Oct 07, 2023  8:52 AM Graeme ORN wrote: Most Recent Primary Care Visit: Provider: DUANNE LOWERS T  Department: [facility]  Visit Type: OFFICE VISIT  Date: 08/27/2023  Medication:  pantoprazole  (PROTONIX ) 40 MG tablet pravastatin  (PRAVACHOL ) 40 MG tablet  Has the patient contacted their pharmacy? No- pharmacy called to make request states patient RX on auto - renewal with Mail order and they do not contact patient prior to requesting if on auto renewal  (Agent: If no, request that the patient contact the pharmacy for the refill. If patient does not wish to contact the pharmacy document the reason why and proceed with request.) (Agent: If yes, when and what did the pharmacy advise?)  Is this the correct pharmacy for this prescription? NO If no, delete pharmacy and type the correct one.  This is the patient's preferred pharmacy:  [facility]: [phone] Ref # [id number]  Has the prescription been filled recently? No  Is the patient out of the medication? No  Has the patient been seen for an appointment in the last year OR does the patient have an upcoming appointment? Yes  Can we respond through MyChart? No  Agent: Please be advised that Rx refills may take up to 3 business days. We ask that you follow-up with your pharmacy.

## 2023-10-12 ENCOUNTER — Ambulatory Visit: Payer: Self-pay | Admitting: Physician Assistant

## 2023-10-12 ENCOUNTER — Other Ambulatory Visit: Payer: Self-pay | Admitting: Family Medicine

## 2023-10-12 ENCOUNTER — Telehealth: Payer: Self-pay | Admitting: Family Medicine

## 2023-10-12 DIAGNOSIS — I209 Angina pectoris, unspecified: Secondary | ICD-10-CM

## 2023-10-12 NOTE — Telephone Encounter (Signed)
Prescription Request  10/12/2023  LOV: 08/27/2023  What is the name of the medication or equipment?   levothyroxine (SYNTHROID) 88 MCG tablet  **original script was for 90 days**  Have you contacted your pharmacy to request a refill? Yes   Which pharmacy would you like this sent to?  [id number] [place], [place] - [address] AT [facility] [address] [place] [address] Phone: [phone] Fax: [phone]    Patient notified that their request is being sent to the clinical staff for review and that they should receive a response within 2 business days.   Please advise pharmacist.

## 2023-10-12 NOTE — Telephone Encounter (Signed)
Requested by interface surescripts. Receipt confirmed by pharmacy 10/07/23 at 10:49 am. Too soon for refill. Last signed 10/07/23 #90. Requested Prescriptions  Refused Prescriptions Disp Refills   pantoprazole (PROTONIX) 40 MG tablet [Pharmacy Med Name: PANTOPRAZOLE SOD DR 40 MG TAB] 90 tablet 0    Sig: TAKE 1 TABLET (40 MG TOTAL) BY MOUTH EVERY MORNING. TAKE 1 TABLET EVERY MORNING     Gastroenterology: Proton Pump Inhibitors Failed - 10/12/2023  1:30 PM      Failed - Valid encounter within last 12 months    Recent Outpatient Visits           1 year ago Fatigue, unspecified type   [facility] Pickard, Priscille Heidelberg, MD   2 years ago Dysuria   [facility] Donita Brooks, MD   2 years ago Pneumonia due to COVID-19 virus   [facility] Donita Brooks, MD   2 years ago COPD exacerbation [facility])   [facility] Donita Brooks, MD   2 years ago COPD exacerbation [facility])   [facility] Valentino Nose, NP       Future Appointments             In 1 week Carman Ching, PA-C [facility] [place]   In 2 weeks Carman Ching, PA-C [facility] [place]   In 3 weeks Carman Ching, [facility] [place]

## 2023-10-12 NOTE — Telephone Encounter (Signed)
[facility] and spoke to [place] about the refill(s) levothyroxine requested. Advised it was sent on 09/14/23 #/90/1 refill(s). He asks is she supposed to be on 100 mg or 88 mg, advised 88 mg is the most current in the chart. He says they have those refills available and he will cancel the request for the 100 mg.

## 2023-10-15 ENCOUNTER — Other Ambulatory Visit: Payer: Self-pay | Admitting: Family Medicine

## 2023-10-15 DIAGNOSIS — F339 Major depressive disorder, recurrent, unspecified: Secondary | ICD-10-CM

## 2023-10-19 ENCOUNTER — Ambulatory Visit (INDEPENDENT_AMBULATORY_CARE_PROVIDER_SITE_OTHER): Payer: Medicare HMO | Admitting: Physician Assistant

## 2023-10-19 DIAGNOSIS — C679 Malignant neoplasm of bladder, unspecified: Secondary | ICD-10-CM

## 2023-10-19 DIAGNOSIS — D494 Neoplasm of unspecified behavior of bladder: Secondary | ICD-10-CM

## 2023-10-19 LAB — URINALYSIS, COMPLETE
Bilirubin, UA: NEGATIVE
Glucose, UA: NEGATIVE
Nitrite, UA: NEGATIVE
Specific Gravity, UA: >1.03 — ABNORMAL HIGH (ref 1.005–1.030)
Urobilinogen, Ur: 0.2 mg/dL (ref 0.2–1.0)
pH, UA: 6 (ref 5.0–7.5)

## 2023-10-19 LAB — MICROSCOPIC EXAMINATION

## 2023-10-19 MED ORDER — BCG LIVE 50 MG IS SUSR
3.2400 mL | Freq: Once | INTRAVESICAL | Status: AC
Start: 2023-10-19 — End: 2023-10-19
  Administered 2023-10-19: 81 mg via INTRAVESICAL

## 2023-10-19 NOTE — Progress Notes (Signed)
 BCG Bladder Instillation  BCG # 1 of 3  Due to Bladder Cancer patient is present today for a BCG treatment. Patient was cleaned and prepped in a sterile fashion with betadine. A 14FR catheter was inserted, urine return was noted 10ml, urine was yellow in color.  50ml of reconstituted BCG was instilled into the bladder. The catheter was then removed. Patient tolerated well, no complications were noted  Performed by: Carman Ching, PA-C and Ples Specter, CMA  Additional notes: UA is suspicious today but she is asymptomatic, suspect colonization. Will send for culture out of an abundance of caution.  Follow up: 1 week for BCG #2 of 3

## 2023-10-22 ENCOUNTER — Other Ambulatory Visit: Payer: Self-pay

## 2023-10-22 LAB — CULTURE, URINE COMPREHENSIVE

## 2023-10-22 MED ORDER — NITROFURANTOIN MONOHYD MACRO 100 MG PO CAPS
100.0000 mg | ORAL_CAPSULE | Freq: Two times a day (BID) | ORAL | 0 refills | Status: AC
Start: 2023-10-22 — End: 2023-10-27

## 2023-10-26 ENCOUNTER — Ambulatory Visit (INDEPENDENT_AMBULATORY_CARE_PROVIDER_SITE_OTHER): Payer: Medicare HMO | Admitting: Physician Assistant

## 2023-10-26 ENCOUNTER — Encounter: Payer: Self-pay | Admitting: Physician Assistant

## 2023-10-26 VITALS — BP 142/80 | HR 68 | Ht 62.0 in | Wt 155.0 lb

## 2023-10-26 DIAGNOSIS — Z8551 Personal history of malignant neoplasm of bladder: Secondary | ICD-10-CM

## 2023-10-26 DIAGNOSIS — C679 Malignant neoplasm of bladder, unspecified: Secondary | ICD-10-CM | POA: Diagnosis not present

## 2023-10-26 LAB — URINALYSIS, COMPLETE
Bilirubin, UA: NEGATIVE
Glucose, UA: NEGATIVE
Ketones, UA: NEGATIVE
Leukocytes,UA: NEGATIVE
Nitrite, UA: NEGATIVE
Protein,UA: NEGATIVE
RBC, UA: NEGATIVE
Specific Gravity, UA: 1.025 (ref 1.005–1.030)
Urobilinogen, Ur: 0.2 mg/dL (ref 0.2–1.0)
pH, UA: 5.5 (ref 5.0–7.5)

## 2023-10-26 LAB — MICROSCOPIC EXAMINATION: Epithelial Cells (non renal): >10 /HPF — AB (ref 0–10)

## 2023-10-26 MED ORDER — BCG LIVE 50 MG IS SUSR
3.2400 mL | Freq: Once | INTRAVESICAL | Status: AC
Start: 2023-10-26 — End: 2023-10-26
  Administered 2023-10-26: 81 mg via INTRAVESICAL

## 2023-10-26 NOTE — Progress Notes (Signed)
 BCG Bladder Instillation  BCG # 2 of 3  Due to Bladder Cancer patient is present today for a BCG treatment. Patient was cleaned and prepped in a sterile fashion with betadine. A 14FR catheter was inserted, urine return was noted 25ml, urine was yellow in color.  50ml of reconstituted BCG was instilled into the bladder. The catheter was then removed. Patient tolerated well, no complications were noted  Performed by: Carman Ching, PA-C and Benay Pike, CMA  Follow up: 1 week for BCG #3 of 3

## 2023-11-02 ENCOUNTER — Ambulatory Visit: Payer: Medicare HMO | Admitting: Physician Assistant

## 2023-11-02 DIAGNOSIS — D494 Neoplasm of unspecified behavior of bladder: Secondary | ICD-10-CM

## 2023-11-02 DIAGNOSIS — C679 Malignant neoplasm of bladder, unspecified: Secondary | ICD-10-CM

## 2023-11-02 LAB — MICROSCOPIC EXAMINATION

## 2023-11-02 LAB — URINALYSIS, COMPLETE
Bilirubin, UA: NEGATIVE
Glucose, UA: NEGATIVE
Ketones, UA: NEGATIVE
Nitrite, UA: NEGATIVE
Protein,UA: NEGATIVE
Specific Gravity, UA: 1.025 (ref 1.005–1.030)
Urobilinogen, Ur: 0.2 mg/dL (ref 0.2–1.0)
pH, UA: 5.5 (ref 5.0–7.5)

## 2023-11-02 MED ORDER — BCG LIVE 50 MG IS SUSR
3.2400 mL | Freq: Once | INTRAVESICAL | Status: AC
  Administered 2023-11-12: 81 mg via INTRAVESICAL

## 2023-11-02 NOTE — Progress Notes (Signed)
 BCG Bladder Instillation  BCG # 3 of 3  Due to Bladder Cancer patient is present today for a BCG treatment. Patient was cleaned and prepped in a sterile fashion with betadine. A 14FR catheter was inserted, urine return was noted 10ml, urine was yellow in color.  50ml of reconstituted BCG was instilled into the bladder. The catheter was then removed. Patient tolerated well, no complications were noted  Performed by: Ples Specter, CMA  Follow up/ Additional notes: 3 month cysto

## 2023-11-10 ENCOUNTER — Other Ambulatory Visit: Payer: Self-pay | Admitting: Family Medicine

## 2023-11-10 ENCOUNTER — Telehealth: Payer: Self-pay | Admitting: Family Medicine

## 2023-11-10 ENCOUNTER — Other Ambulatory Visit: Payer: Self-pay | Admitting: Urology

## 2023-11-10 ENCOUNTER — Other Ambulatory Visit: Payer: Self-pay

## 2023-11-10 DIAGNOSIS — I209 Angina pectoris, unspecified: Secondary | ICD-10-CM

## 2023-11-10 DIAGNOSIS — E039 Hypothyroidism, unspecified: Secondary | ICD-10-CM

## 2023-11-10 DIAGNOSIS — R5383 Other fatigue: Secondary | ICD-10-CM

## 2023-11-10 DIAGNOSIS — J439 Emphysema, unspecified: Secondary | ICD-10-CM

## 2023-11-10 MED ORDER — ALBUTEROL SULFATE HFA 108 (90 BASE) MCG/ACT IN AERS: INHALATION_SPRAY | RESPIRATORY_TRACT | 1 refills | Status: DC

## 2023-11-10 MED ORDER — LEVOTHYROXINE SODIUM 88 MCG PO TABS: 88.0000 ug | ORAL_TABLET | Freq: Every day | ORAL | 1 refills | Status: DC

## 2023-11-10 NOTE — Telephone Encounter (Signed)
 Requested Prescriptions  Pending Prescriptions Disp Refills   Eszopiclone 3 MG TABS [Pharmacy Med Name: ESZOPICLONE 3 MG TABLET] 30 tablet 2    Sig: TAKE 1 TABLET BY MOUTH AT BEDTIME TAKE IMMEDIATELY BEFORE BEDTIME     Not Delegated - Psychiatry:  Anxiolytics/Hypnotics Failed - 11/10/2023  5:29 PM      Failed - This refill cannot be delegated      Failed - Urine Drug Screen completed in last 360 days      Failed - Valid encounter within last 6 months    Recent Outpatient Visits           1 year ago Fatigue, unspecified type   [facility] Donita Brooks, MD   2 years ago Dysuria   [facility] Donita Brooks, MD   2 years ago Pneumonia due to COVID-19 virus   [facility], MD   2 years ago COPD exacerbation [facility])   [facility] Donita Brooks, MD   2 years ago COPD exacerbation (HCC)   [facility] Cathlean Marseilles A, NP               pantoprazole (PROTONIX) 40 MG tablet [Pharmacy Med Name: PANTOPRAZOLE SOD DR 40 MG TAB] 90 tablet 0    Sig: TAKE 1 TABLET (40 MG TOTAL) BY MOUTH EVERY MORNING. TAKE 1 TABLET EVERY MORNING     Gastroenterology: Proton Pump Inhibitors Failed - 11/10/2023  5:29 PM      Failed - Valid encounter within last 12 months    Recent Outpatient Visits           1 year ago Fatigue, unspecified type   [facility] Pickard, Priscille Heidelberg, MD   2 years ago Dysuria   [facility] Donita Brooks, MD   2 years ago Pneumonia due to COVID-19 virus   [facility] Donita Brooks, MD   2 years ago COPD exacerbation [facility])   [facility] Donita Brooks, MD   2 years ago COPD exacerbation [facility])   [facility] Valentino Nose, NP

## 2023-11-10 NOTE — Telephone Encounter (Signed)
 Requested medication (s) are due for refill today: yes  Requested medication (s) are on the active medication list: yes  Last refill:  08/17/23 #30/2  Future visit scheduled: no  Notes to clinic:  Unable to refill per protocol, cannot delegate.      Requested Prescriptions  Pending Prescriptions Disp Refills   Eszopiclone 3 MG TABS [Pharmacy Med Name: ESZOPICLONE 3 MG TABLET] 30 tablet 2    Sig: TAKE 1 TABLET BY MOUTH AT BEDTIME TAKE IMMEDIATELY BEFORE BEDTIME     Not Delegated - Psychiatry:  Anxiolytics/Hypnotics Failed - 11/10/2023  5:30 PM      Failed - This refill cannot be delegated      Failed - Urine Drug Screen completed in last 360 days      Failed - Valid encounter within last 6 months    Recent Outpatient Visits           1 year ago Fatigue, unspecified type   [facility] Donita Brooks, MD   2 years ago Dysuria   [facility] Donita Brooks, MD   2 years ago Pneumonia due to COVID-19 virus   [facility] Donita Brooks, MD   2 years ago COPD exacerbation [facility])   [facility] Donita Brooks, MD   2 years ago COPD exacerbation (HCC)   [facility] Valentino Nose, NP              Refused Prescriptions Disp Refills   pantoprazole (PROTONIX) 40 MG tablet [Pharmacy Med Name: PANTOPRAZOLE SOD DR 40 MG TAB] 90 tablet 0    Sig: TAKE 1 TABLET (40 MG TOTAL) BY MOUTH EVERY MORNING. TAKE 1 TABLET EVERY MORNING     Gastroenterology: Proton Pump Inhibitors Failed - 11/10/2023  5:30 PM      Failed - Valid encounter within last 12 months    Recent Outpatient Visits           1 year ago Fatigue, unspecified type   [facility] Pickard, Priscille Heidelberg, MD   2 years ago Dysuria   [facility] Donita Brooks, MD   2 years ago Pneumonia due to COVID-19 virus   [facility] Medicine Donita Brooks, MD   2 years ago COPD exacerbation  [facility])   [facility] Donita Brooks, MD   2 years ago COPD exacerbation [facility])   [facility] Valentino Nose, NP

## 2023-11-10 NOTE — Telephone Encounter (Signed)
 Prescription Request  11/10/2023  LOV: 08/27/2023  What is the name of the medication or equipment?   albuterol (VENTOLIN HFA) 108 (90 Base) MCG/ACT inhaler  LEVOTHYROXINE 100 MCG TABLET   Have you contacted your pharmacy to request a refill? Yes   Which pharmacy would you like this sent to?  [id number] [place], [place] - [address] AT [facility] [address] [place] [address] Phone: [phone] Fax: [phone]    Patient notified that their request is being sent to the clinical staff for review and that they should receive a response within 2 business days.   Please advise pharmacist.

## 2023-11-12 DIAGNOSIS — D494 Neoplasm of unspecified behavior of bladder: Secondary | ICD-10-CM

## 2023-12-10 ENCOUNTER — Telehealth: Payer: Self-pay

## 2023-12-10 ENCOUNTER — Other Ambulatory Visit: Payer: Self-pay | Admitting: Family Medicine

## 2023-12-10 ENCOUNTER — Other Ambulatory Visit: Payer: Self-pay | Admitting: Urology

## 2023-12-10 DIAGNOSIS — J439 Emphysema, unspecified: Secondary | ICD-10-CM

## 2023-12-10 DIAGNOSIS — I209 Angina pectoris, unspecified: Secondary | ICD-10-CM

## 2023-12-10 NOTE — Telephone Encounter (Signed)
 Prescription Request  12/10/2023  LOV: 08/27/23  What is the name of the medication or equipment? levothyroxine (SYNTHROID) 88 MCG tablet [[id number]]   Have you contacted your pharmacy to request a refill? Yes   Which pharmacy would you like this sent to?  [id number] [place], [place] - [address] AT [facility] [address] [place] [address] Phone: [phone] Fax: [phone]    Patient notified that their request is being sent to the clinical staff for review and that they should receive a response within 2 business days.   Please advise at [facility] [phone]

## 2023-12-10 NOTE — Telephone Encounter (Signed)
 Albuterol- 11/10/23 18ea 1RF Pantoprazole -10/07/23 #90 Requested Prescriptions  Pending Prescriptions Disp Refills   pantoprazole (PROTONIX) 40 MG tablet [Pharmacy Med Name: PANTOPRAZOLE SOD DR 40 MG TAB] 90 tablet 0    Sig: TAKE 1 TABLET (40 MG TOTAL) BY MOUTH EVERY MORNING. TAKE 1 TABLET EVERY MORNING     Gastroenterology: Proton Pump Inhibitors Passed - 12/10/2023  4:16 PM      Passed - Valid encounter within last 12 months    Recent Outpatient Visits           3 months ago Essential hypertension   [place] [facility] Pickard, Priscille Heidelberg, MD   11 months ago Other fatigue   [place] [facility] Pickard, Priscille Heidelberg, MD   1 year ago Encounter for screening mammogram for malignant neoplasm of breast   [place] [facility] Pickard, Priscille Heidelberg, MD               albuterol (VENTOLIN HFA) 108 (90 Base) MCG/ACT inhaler [Pharmacy Med Name: ALBUTEROL HFA (VENTOLIN) INH] 18 each 1    Sig: USE 2 INHALATIONS ORALLY EVERY 6 HOURS AS NEEDED FORWHEEZING OR SHORTNESS OF BREATH     Pulmonology:  Beta Agonists 2 Failed - 12/10/2023  4:16 PM      Failed - Last BP in normal range    BP Readings from Last 1 Encounters:  10/26/23 (!) 142/80         Passed - Last Heart Rate in normal range    Pulse Readings from Last 1 Encounters:  10/26/23 68         Passed - Valid encounter within last 12 months    Recent Outpatient Visits           3 months ago Essential hypertension   [place] [facility] Donita Brooks, MD   11 months ago Other fatigue   [facility] Pickard, Priscille Heidelberg, MD   1 year ago Encounter for screening mammogram for malignant neoplasm of breast   [place] [facility] Pickard, Priscille Heidelberg, MD

## 2023-12-10 NOTE — Telephone Encounter (Signed)
 Refills sent on 11/10/23 #90/1 refill, too early to refill.

## 2023-12-15 ENCOUNTER — Telehealth: Payer: Self-pay

## 2023-12-15 NOTE — Telephone Encounter (Signed)
 Copied from CRM [id number]. Topic: Clinical - Prescription Issue >> Dec 15, 2023 10:12 AM Jorie Newness J wrote: Reason for CRM: Pt states her Pharmacy cannot fill  Eszopiclone without a Pa from insurance. The pt states she has been getting the medication previously for 30 days without a PA, but they are requesting a PA for a 90 day fill. Please advise

## 2023-12-17 ENCOUNTER — Telehealth: Payer: Self-pay

## 2023-12-17 NOTE — Telephone Encounter (Signed)
 Copied from CRM [id number]. Topic: Clinical - Prescription Issue >> Dec 15, 2023 10:12 AM Nestora J wrote: Reason for CRM: Pt states her Pharmacy cannot fill  Eszopiclone  without a Pa from insurance. The pt states she has been getting the medication previously for 30 days without a PA, but they are requesting a PA for a 90 day fill. Please advise >> Dec 17, 2023 10:48 AM Emylou G wrote: Adv patient.. still pending review.. checking status of requested refill: eszopiclone .. Pls call her

## 2023-12-21 ENCOUNTER — Other Ambulatory Visit: Payer: Self-pay | Admitting: Family Medicine

## 2023-12-21 MED ORDER — ZOLPIDEM TARTRATE 10 MG PO TABS: 5.0000 mg | ORAL_TABLET | Freq: Every evening | ORAL | 1 refills | Status: DC | PRN

## 2023-12-22 ENCOUNTER — Telehealth: Payer: Self-pay

## 2023-12-22 NOTE — Telephone Encounter (Signed)
 Pt advised that [employer] will not cover Eszopiclone  3 mg any longer and new Rx for Zolpidem  Tartrate 10 mg sent in by Dr. Cheril Cork. Pt verbalized understanding of all. Mjp,lpn

## 2023-12-29 NOTE — Telephone Encounter (Unsigned)
 Copied from CRM [id number]. Topic: Clinical - Prescription Issue >> Dec 29, 2023 11:05 AM Baldomero Bone wrote: Reason for CRM: Dr Cheril Cork changed sleeping pills from Lunesta  to zolpidem  (AMBIEN ) 10 MG tablet. Patient states the medication is not working for her. The bottle states to take 5 MG so she has been cutting it in half. Last night, she took the full 10 MG, and it still didn't work. She is waking up in the middle of the night and lately her hand has been numb as well. Callback number is [phone]

## 2023-12-30 ENCOUNTER — Other Ambulatory Visit: Payer: Self-pay | Admitting: Family Medicine

## 2023-12-30 MED ORDER — TRAZODONE HCL 50 MG PO TABS: 50.0000 mg | ORAL_TABLET | Freq: Every evening | ORAL | 1 refills | Status: DC | PRN

## 2024-01-03 ENCOUNTER — Other Ambulatory Visit: Payer: Self-pay | Admitting: Family Medicine

## 2024-01-03 DIAGNOSIS — I209 Angina pectoris, unspecified: Secondary | ICD-10-CM

## 2024-01-05 NOTE — Telephone Encounter (Signed)
 Requested Prescriptions  Pending Prescriptions Disp Refills   pantoprazole  (PROTONIX ) 40 MG tablet [Pharmacy Med Name: PANTOPRAZOLE  SOD DR 40 MG TAB] 90 tablet 0    Sig: TAKE 1 TABLET (40 MG TOTAL) BY MOUTH EVERY MORNING. TAKE 1 TABLET EVERY MORNING     Gastroenterology: Proton Pump Inhibitors Failed - 01/05/2024 11:39 AM      Failed - Valid encounter within last 12 months    Recent Outpatient Visits           4 months ago Essential hypertension   [place] [facility] Pickard, Cisco Crest, MD   12 months ago Other fatigue   [place] [facility] Medicine Pickard, Cisco Crest, MD   1 year ago Encounter for screening mammogram for malignant neoplasm of breast   [place] [facility] Pickard, Cisco Crest, MD

## 2024-01-06 ENCOUNTER — Ambulatory Visit: Admitting: Family Medicine

## 2024-01-06 VITALS — BP 120/62 | HR 58 | Temp 98.3°F | Ht 62.0 in | Wt 158.6 lb

## 2024-01-06 DIAGNOSIS — M79674 Pain in right toe(s): Secondary | ICD-10-CM

## 2024-01-06 MED ORDER — GABAPENTIN 300 MG PO CAPS
300.0000 mg | ORAL_CAPSULE | Freq: Three times a day (TID) | ORAL | 3 refills | Status: DC | PRN
Start: 2024-01-06 — End: 2024-04-18

## 2024-01-06 NOTE — Progress Notes (Signed)
 Subjective:    Patient ID: Alexa Orozco, female    DOB: [birth year], 78 y.o.   MRN: [id number]  HPI  Patient reports numbness and tingling in her right hand.  Reports her Pain radiating from her first MCP joint into her thumb, index finger, and middle finger.  The pain wakes her up at night.  She reports decreasing grip strength in her hand.  She reports trouble making a fist.  She reports tenderness and swelling in her MCP and PIP joints.  On exam today she has a positive Tinel sign.  She has a positive Phalen sign.  She has normal pulses at the radial and ulnar artery.  She does have slightly diminished grip strength in the right hand and some wasting of the thenar eminence.  She has been seeing orthopedist and receiving cortisone injections for carpal tunnel syndrome every few months.  However the injections are no longer beneficial.  The pain wakes her up at night.  Second issue, the patient reports daily pain her right fifth toe for more than a month.  She saw podiatrist who told her it was infected.  He gave her antibiotics but the erythema has never faded.  The toe distal to the MTP joint is slightly erythematous.  It is blanchable erythema.  There is no warmth she states it hurts for her to wear her tennis shoes because it rubs.  There is a callus over her IP joint  Past Medical History:  Diagnosis Date   Allergies    Allergy    Anxiety    Asthma    Bladder cancer (HCC)    CAD S/P percutaneous coronary angioplasty 10/2005   Class III Angina --> Cath: 85% -- PCI 3.19mm x 18mm (4.0 mm) Cypher DES to RCA; Myoview  October 2014: normal stress test: LOW RISK; mild anteroapical breast attenuation   Cataract    h/o bilat repair   Claudication (HCC) 06/01/2013   COPD (chronic obstructive pulmonary disease) (HCC)    Depression 11/29/2013   Emphysema of lung (HCC)    GERD (gastroesophageal reflux disease)    History of palpitations    History of tobacco abuse    Hyperlipidemia    Hypertension     well-controlled   Hypothyroidism    Osteoporosis    Raynaud disease    Past Surgical History:  Procedure Laterality Date   ABDOMINAL HYSTERECTOMY     h/o partila hysterectomy   BREAST BIOPSY  08/31/1986   COLONOSCOPY  2006   Dr. Grandville Lax: multiple hyperplastic polyps   COLONOSCOPY  2001   adenomatous polyps   COLONOSCOPY WITH PROPOFOL  N/A 04/24/2022   Procedure: COLONOSCOPY WITH PROPOFOL ;  Surgeon: Suzette Espy, MD;  [facility];  Service: Endoscopy;  Laterality: N/A;  1:00pm, asa 3   CORONARY ANGIOPLASTY WITH STENT PLACEMENT  10/14/2005   cypher DES (3.52mmx18mm) to high grade RCA lesion   NM MYOVIEW  LTD  July 2012 of October 2014   '12: dipyridamole; Normal, low risk study; 2014 - normal stress test: LOW RISK; mild anteroapical breast attenuation   POLYPECTOMY  04/24/2022   Procedure: POLYPECTOMY;  Surgeon: Suzette Espy, MD;  [facility]: [facility];  Service: Endoscopy;;   TONSILLECTOMY  08/31/1957   TRANSTHORACIC ECHOCARDIOGRAM  05/22/2009   EF=>55%, normal LV systolic function; normal RV systolic function; mild MR/TR   TRANSURETHRAL RESECTION OF BLADDER TUMOR N/A 11/08/2020   Procedure: TRANSURETHRAL RESECTION OF BLADDER TUMOR (TURBT);  Surgeon: Lawerence Pressman, MD;  Location: [facility]  ORS;  Service: Urology;  Laterality: N/A;   TRANSURETHRAL RESECTION OF BLADDER TUMOR N/A 12/13/2020   Procedure: TRANSURETHRAL RESECTION OF BLADDER TUMOR (TURBT);  Surgeon: Lawerence Pressman, MD;  Location: [facility] ORS;  Service: Urology;  Laterality: N/A;   Current Outpatient Medications on File Prior to Visit  Medication Sig Dispense Refill   acetaminophen  (TYLENOL ) 500 MG tablet Take 500-1,000 mg by mouth every 6 (six) hours as needed (pain).     albuterol  (VENTOLIN  HFA) 108 (90 Base) MCG/ACT inhaler USE 2 INHALATIONS ORALLY   EVERY 6 HOURS AS NEEDED FORWHEEZING OR SHORTNESS OF   BREATH 18 each 1   aspirin  EC 81 MG EC tablet Take 1 tablet (81 mg total) by mouth daily. Swallow whole.  30 tablet 11   Budeson-Glycopyrrol-Formoterol  (BREZTRI  AEROSPHERE) 160-9-4.8 MCG/ACT AERO Inhale 2 puffs into the lungs 2 (two) times daily. 10.7 g 11   clonazePAM  (KLONOPIN ) 0.5 MG tablet TAKE 1 TABLET BY MOUTH TWICE A DAY AS NEEDED FOR ANXIETY 30 tablet 1   clopidogrel  (PLAVIX ) 75 MG tablet Take 1 tablet (75 mg total) by mouth daily. 90 tablet 3   diclofenac  Sodium (VOLTAREN ) 1 % GEL APPLY 2 G TOPICALLY 4 (FOUR) TIMES DAILY AS NEEDED (PAIN). 100 g 1   enalapril  (VASOTEC ) 2.5 MG tablet Take 2.5 mg by mouth daily.     Fluticasone-Umeclidin-Vilant (TRELEGY ELLIPTA ) 100-62.5-25 MCG/ACT AEPB Inhale 1 puff into the lungs daily. 1 each 11   GEMTESA  75 MG TABS Take 1 tablet (75 mg total) by mouth daily. 30 tablet 11   levothyroxine  (SYNTHROID ) 88 MCG tablet Take 1 tablet (88 mcg total) by mouth daily. 90 tablet 1   linaclotide  (LINZESS ) 145 MCG CAPS capsule Take 1 capsule (145 mcg total) by mouth daily before breakfast. 90 capsule 3   LORazepam  (ATIVAN ) 0.5 MG tablet Take 1 tablet (0.5 mg total) by mouth 2 (two) times daily as needed for anxiety. 30 tablet 1   meclizine  (ANTIVERT ) 25 MG tablet Take 0.5 tablets (12.5 mg total) by mouth 3 (three) times daily as needed for dizziness. 30 tablet 0   mirabegron  ER (MYRBETRIQ ) 50 MG TB24 tablet Take 1 tablet (50 mg total) by mouth daily. 90 tablet 2   montelukast  (SINGULAIR ) 10 MG tablet TAKE 1 TABLET AT BEDTIMETAKE 1 TABLET AT BEDTIME 30 tablet 0   nitroGLYCERIN  (NITROSTAT ) 0.4 MG SL tablet Place 1 tablet (0.4 mg total) under the tongue every 5 (five) minutes as needed for chest pain. 25 tablet 6   pantoprazole  (PROTONIX ) 40 MG tablet TAKE 1 TABLET (40 MG TOTAL) BY MOUTH EVERY MORNING. TAKE 1 TABLET EVERY MORNING 90 tablet 0   rosuvastatin  (CRESTOR ) 20 MG tablet Take 1 tablet (20 mg total) by mouth daily. Stop pravastatin  90 tablet 3   sertraline  (ZOLOFT ) 100 MG tablet TAKE 1 AND 1/2 TABLETS     DAILY 135 tablet 1   Tiotropium Bromide  Monohydrate (SPIRIVA   RESPIMAT) 2.5 MCG/ACT AERS      traZODone  (DESYREL ) 50 MG tablet Take 1 tablet (50 mg total) by mouth at bedtime as needed for sleep. 90 tablet 1   No current facility-administered medications on file prior to visit.   Allergies  Allergen Reactions   Bystolic [Nebivolol Hcl] Hives   Cephalexin Hives    Keflex*   Effexor  [Venlafaxine ] Other (See Comments)    Made her face hot and red   Elemental Sulfur Dermatitis   Levaquin  [Levofloxacin  In D5w] Other (See Comments)    Caused face to turn  red and patient became extremely hot    Social History   Socioeconomic History   Marital status: Widowed    Spouse name: Not on file   Number of children: 3   Years of education: 10th    Highest education level: Not on file  Occupational History   Occupation: Retired  Tobacco Use   Smoking status: Every Day    Current packs/day: 0.50    Average packs/day: 0.5 packs/day for 35.0 years (17.5 ttl pk-yrs)    Types: Cigarettes    Passive exposure: Never   Smokeless tobacco: Never  Vaping Use   Vaping status: Never Used  Substance and Sexual Activity   Alcohol use: No   Drug use: No   Sexual activity: Not on file  Other Topics Concern   Not on file  Social History Narrative   She is a widowed mother of 3, grandmother for, great-grandmother 1.  She still smokes about half pack a day.  She doesn't have about 35 years.  She does not drink.   Prior to her symptoms coming on, she used to do all kind of walking around and working in the garden and other activities with her close friend.   Social Drivers of Corporate investment banker Strain: Low Risk  (01/23/2022)   Overall Financial Resource Strain (CARDIA)    Difficulty of Paying Living Expenses: Not very hard  Food Insecurity: No Food Insecurity (01/23/2022)   Hunger Vital Sign    Worried About Running Out of Food in the Last Year: Never true    Ran Out of Food in the Last Year: Never true  Transportation Needs: No Transportation Needs  (01/23/2022)   PRAPARE - Administrator, Civil Service (Medical): No    Lack of Transportation (Non-Medical): No  Physical Activity: Insufficiently Active (01/23/2022)   Exercise Vital Sign    Days of Exercise per Week: 3 days    Minutes of Exercise per Session: 30 min  Stress: No Stress Concern Present (01/23/2022)   [employer] of Occupational Health - Occupational Stress Questionnaire    Feeling of Stress : Not at all  Social Connections: Moderately Integrated (01/23/2022)   Social Connection and Isolation Panel [NHANES]    Frequency of Communication with Friends and Family: More than three times a week    Frequency of Social Gatherings with Friends and Family: More than three times a week    Attends Religious Services: More than 4 times per year    Active Member of [employer] or Organizations: Yes    Attends Banker Meetings: More than 4 times per year    Marital Status: Widowed  Intimate Partner Violence: Not At Risk (01/23/2022)   Humiliation, Afraid, Rape, and Kick questionnaire    Fear of Current or Ex-Partner: No    Emotionally Abused: No    Physically Abused: No    Sexually Abused: No      Review of Systems  All other systems reviewed and are negative.      Objective:   Physical Exam Vitals reviewed.  Constitutional:      General: She is not in acute distress.    Appearance: Normal appearance. She is normal weight. She is not ill-appearing or toxic-appearing.  HENT:     Head: Normocephalic and atraumatic.  Cardiovascular:     Rate and Rhythm: Normal rate and regular rhythm.     Pulses:          Dorsalis pedis pulses are  2+ on the right side and 2+ on the left side.       Posterior tibial pulses are 2+ on the right side and 2+ on the left side.     Heart sounds: Normal heart sounds.  Pulmonary:     Effort: Pulmonary effort is normal.     Breath sounds: No decreased air movement. No decreased breath sounds, wheezing, rhonchi or rales.   Abdominal:     General: Bowel sounds are normal.     Palpations: Abdomen is soft.     Tenderness: There is no abdominal tenderness.  Musculoskeletal:     Right hand: Tenderness and bony tenderness present. Normal range of motion. Decreased strength. Decreased sensation of the median distribution.       Feet:  Lymphadenopathy:     Cervical: No cervical adenopathy.  Neurological:     Mental Status: She is alert.         Assessment & Plan:  Toe pain, right - Plan: DG Foot Complete Right I believe the pain in the patient's hand is due to carpal tunnel syndrome.  I recommended that she make a follow-up appointment with her orthopedist to discuss surgical repair.  Like to go.  There is a slight callus over the IP joint.  I am concerned the patient may have osteomyelitis.  Recommended obtaining an x-ray to evaluate further given the duration of the erythema.

## 2024-01-07 ENCOUNTER — Ambulatory Visit (HOSPITAL_COMMUNITY)
Admission: RE | Admit: 2024-01-07 | Discharge: 2024-01-07 | Disposition: A | Discharge status: Home / Self Care | Source: Ambulatory Visit | Attending: Family Medicine | Admitting: Family Medicine

## 2024-01-07 DIAGNOSIS — M79674 Pain in right toe(s): Secondary | ICD-10-CM | POA: Diagnosis present

## 2024-01-10 ENCOUNTER — Telehealth: Payer: Self-pay

## 2024-01-10 NOTE — Telephone Encounter (Signed)
 Copied from CRM [id number]. Topic: General - Other >> Jan 10, 2024 10:13 AM Zipporah Him wrote: Reason for CRM:  Patient is letting dr pickard know she completed an xray on her big toe on Friday of last week.

## 2024-01-13 ENCOUNTER — Ambulatory Visit: Payer: Self-pay | Admitting: Family Medicine

## 2024-01-13 ENCOUNTER — Other Ambulatory Visit: Payer: Self-pay

## 2024-01-13 ENCOUNTER — Other Ambulatory Visit: Admitting: Urology

## 2024-01-13 DIAGNOSIS — M79674 Pain in right toe(s): Secondary | ICD-10-CM

## 2024-01-18 ENCOUNTER — Other Ambulatory Visit: Payer: Self-pay | Admitting: Family Medicine

## 2024-01-19 ENCOUNTER — Other Ambulatory Visit: Payer: Self-pay | Admitting: Urology

## 2024-01-19 ENCOUNTER — Other Ambulatory Visit: Payer: Self-pay | Admitting: Family Medicine

## 2024-01-19 DIAGNOSIS — J439 Emphysema, unspecified: Secondary | ICD-10-CM

## 2024-01-19 NOTE — Telephone Encounter (Signed)
 Requested medication (s) are due for refill today - provider review - expired Rx  Requested medication (s) are on the active medication list -yes  Future visit scheduled -no  Last refill: 12/29/22 #30 1RF  Notes to clinic: non delegated Rx  Requested Prescriptions  Pending Prescriptions Disp Refills   clonazePAM  (KLONOPIN ) 0.5 MG tablet [Pharmacy Med Name: CLONAZEPAM  0.5 MG TABLET] 30 tablet     Sig: TAKE 1 TABLET BY MOUTH TWICE A DAY AS NEEDED FOR ANXIETY     Not Delegated - Psychiatry: Anxiolytics/Hypnotics 2 Failed - 01/19/2024  4:06 PM      Failed - This refill cannot be delegated      Failed - Urine Drug Screen completed in last 360 days      Failed - Valid encounter within last 6 months    Recent Outpatient Visits           1 week ago Toe pain, right   [place] [facility] Pickard, Cisco Crest, MD   4 months ago Essential hypertension   [place] [facility] Pickard, Cisco Crest, MD   1 year ago Other fatigue   [facility] Cheril Cork, Cisco Crest, MD   1 year ago Encounter for screening mammogram for malignant neoplasm of breast   [facility] Pickard, Cisco Crest, MD              Passed - Patient is not pregnant         Requested Prescriptions  Pending Prescriptions Disp Refills   clonazePAM  (KLONOPIN ) 0.5 MG tablet [Pharmacy Med Name: CLONAZEPAM  0.5 MG TABLET] 30 tablet     Sig: TAKE 1 TABLET BY MOUTH TWICE A DAY AS NEEDED FOR ANXIETY     Not Delegated - Psychiatry: Anxiolytics/Hypnotics 2 Failed - 01/19/2024  4:06 PM      Failed - This refill cannot be delegated      Failed - Urine Drug Screen completed in last 360 days      Failed - Valid encounter within last 6 months    Recent Outpatient Visits           1 week ago Toe pain, right   [place] [facility] Austine Lefort, MD   4 months ago Essential hypertension   [place] [facility] Cheril Cork, Cisco Crest, MD   1 year ago Other fatigue   [place] [facility] Cheril Cork, Cisco Crest, MD   1 year ago Encounter for screening mammogram for malignant neoplasm of breast   [place] [facility] Pickard, Cisco Crest, MD              Passed - Patient is not pregnant

## 2024-01-20 NOTE — Telephone Encounter (Signed)
 Requested Prescriptions  Pending Prescriptions Disp Refills   albuterol  (VENTOLIN  HFA) 108 (90 Base) MCG/ACT inhaler [Pharmacy Med Name: ALBUTEROL  HFA (VENTOLIN ) INH] 18 each 2    Sig: USE 2 INHALATIONS ORALLY EVERY 6 HOURS AS NEEDED FORWHEEZING OR SHORTNESS OF BREATH     Pulmonology:  Beta Agonists 2 Failed - 01/20/2024  5:06 PM      Failed - Valid encounter within last 12 months    Recent Outpatient Visits           2 weeks ago Toe pain, right   [place] [facility] Pickard, Cisco Crest, MD   4 months ago Essential hypertension   [place] [facility] Cheril Cork, Cisco Crest, MD   1 year ago Other fatigue   [facility] Cheril Cork, Cisco Crest, MD   1 year ago Encounter for screening mammogram for malignant neoplasm of breast   [place] [facility] Austine Lefort, MD              Passed - Last BP in normal range    BP Readings from Last 1 Encounters:  01/06/24 120/62         Passed - Last Heart Rate in normal range    Pulse Readings from Last 1 Encounters:  01/06/24 (!) 58

## 2024-01-21 ENCOUNTER — Ambulatory Visit: Payer: Self-pay

## 2024-01-21 ENCOUNTER — Other Ambulatory Visit: Payer: Self-pay | Admitting: Family Medicine

## 2024-01-21 MED ORDER — TRAMADOL HCL 50 MG PO TABS: 50.0000 mg | ORAL_TABLET | Freq: Three times a day (TID) | ORAL | 0 refills | Status: DC | PRN

## 2024-01-21 NOTE — Telephone Encounter (Signed)
  Chief Complaint: hand pain Symptoms: right hand pain that is worse at night Frequency: patient states pain has been going on for awhile Pertinent Negatives: Patient denies fever Disposition: [] ED /[] Urgent Care (no appt availability in office) / [] Appointment(In office/virtual)/ []  [place] [facility]/ [] Home Care/ [] Refused Recommended Disposition /[] [place] [facility]/ [x]  Follow-up with PCP Additional Notes: patient calling to request pain medication for right hand pain. Patient states provider suspects her right hand pain is a possible combination of carpal tunnel and neuropathy. Patient states she was started on gabapentin  for the neuropathy. Patient is scheduled to see hand specialist at the end of June. Pain is worse at night-patient is unsure if she is doing something in her sleep that causes her to be woken up. Patient is asking for pain medication to be sent to her pharmacy to be able to get her until she is able to see hand specialist. Patient is asking for follow up call from clinic. Patient verbalized understanding of the plan and all questions answered.    Copied from CRM [id number]. Topic: Clinical - Red Word Triage >> Jan 21, 2024 10:14 AM Stanly Early wrote: Red Word that prompted transfer to Nurse Triage: pain in her hand that keeps her up at night. Reason for Disposition  [1] MODERATE pain (e.g., interferes with normal activities) AND [2] present > 3 days  Answer Assessment - Initial Assessment Questions 1. ONSET: "When did the pain start?"     Patient reports having hand pain for awhile 2. LOCATION: "Where is the pain located?"     Right hand 3. PAIN: "How bad is the pain?" (Scale 1-10; or mild, moderate, severe)   - MILD (1-3): doesn't interfere with normal activities   - MODERATE (4-7): interferes with normal activities (e.g., work or school) or awakens from sleep   - SEVERE (8-10): excruciating pain, unable to use hand at all     Pain is worse at night-waking her up  at night. Reports tingling currently 4. WORK OR EXERCISE: "Has there been any recent work or exercise that involved this part (i.e., hand or wrist) of the body?"     no 5. CAUSE: "What do you think is causing the pain?"     Possible combination of carpal tunnel and neuropathy 6. AGGRAVATING FACTORS: "What makes the pain worse?" (e.g., using computer)     Unsure if she is doing something during sleep that causes increases in pain 7. OTHER SYMPTOMS: "Do you have any other symptoms?" (e.g., neck pain, swelling, rash, numbness, fever)     no  Protocols used: Hand and Wrist Pain-A-AH

## 2024-01-27 ENCOUNTER — Encounter: Payer: Self-pay | Admitting: Podiatry

## 2024-01-27 ENCOUNTER — Ambulatory Visit (INDEPENDENT_AMBULATORY_CARE_PROVIDER_SITE_OTHER): Admitting: Podiatry

## 2024-01-27 DIAGNOSIS — D2372 Other benign neoplasm of skin of left lower limb, including hip: Secondary | ICD-10-CM | POA: Diagnosis not present

## 2024-01-27 DIAGNOSIS — M2041 Other hammer toe(s) (acquired), right foot: Secondary | ICD-10-CM

## 2024-01-27 DIAGNOSIS — D2371 Other benign neoplasm of skin of right lower limb, including hip: Secondary | ICD-10-CM

## 2024-01-27 NOTE — Progress Notes (Signed)
 She presents today for follow-up of her fifth toe right greater than her left.  Says her left foot started to bother her too.  She states it is a lot better than it was back in December but it still hurts with closed shoes.  Objective: Vital signs stable alert oriented x 3.  Pulses are palpable.  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  This is resulting in a benign skin lesion to the dorsal aspect of the PIPJ.  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  Plan: We did debride the benign skin lesion to the right fifth today I am also discussed a arthroplasty fifth digit bilateral she says she would like to do that possibly this fall.

## 2024-02-03 ENCOUNTER — Other Ambulatory Visit: Payer: Self-pay | Admitting: Family Medicine

## 2024-02-03 ENCOUNTER — Telehealth: Payer: Self-pay

## 2024-02-03 DIAGNOSIS — R5383 Other fatigue: Secondary | ICD-10-CM

## 2024-02-03 DIAGNOSIS — E039 Hypothyroidism, unspecified: Secondary | ICD-10-CM

## 2024-02-03 MED ORDER — TRAMADOL HCL 50 MG PO TABS: 50.0000 mg | ORAL_TABLET | Freq: Three times a day (TID) | ORAL | 0 refills | Status: DC | PRN

## 2024-02-03 NOTE — Telephone Encounter (Signed)
 Copied from CRM [id number]. Topic: Clinical - Prescription Issue >> Feb 03, 2024 11:12 AM Sasha H wrote: Reason for CRM: Pt is wanting to know if she can have a refill on the pain medication that was prescribed to her for her hand. Pt went to the dr for it yesterday and they will be doing an ultrasound on July 9th

## 2024-03-07 ENCOUNTER — Other Ambulatory Visit: Payer: Self-pay | Admitting: Family Medicine

## 2024-03-07 DIAGNOSIS — F339 Major depressive disorder, recurrent, unspecified: Secondary | ICD-10-CM

## 2024-03-08 ENCOUNTER — Other Ambulatory Visit: Payer: Self-pay | Admitting: Orthopedic Surgery

## 2024-03-09 ENCOUNTER — Telehealth: Payer: Self-pay | Admitting: Cardiology

## 2024-03-09 NOTE — Telephone Encounter (Signed)
   Pre-operative Risk Assessment    Patient Name: Alexa Orozco  DOB: 03/08/1946 MRN: [id number]   Date of last office visit: 12/25/19 (Pt saw Dr. Court in hospital 05/2021...she is still established) Date of next office visit: nothing    Request for Surgical Clearance    Procedure:  Right carpal tunnel release   Date of Surgery:  Clearance 04/04/24                                Surgeon:  Dr. Murrell Socks Group or Practice Name:  [facility]  Phone number:  [phone] Fax number:  [phone]    Type of Clearance Requested:   - Medical  - Pharmacy:  Hold Aspirin  and Clopidogrel  (Plavix )     Type of Anesthesia:  choice    Additional requests/questions:     [name]   03/09/2024, 4:09 PM

## 2024-03-09 NOTE — Telephone Encounter (Signed)
 S/W pt and scheduled IN OFFICE Preop appt for 03/28/24 with Lum Louis, NP

## 2024-03-09 NOTE — Telephone Encounter (Signed)
   Name: Alexa Orozco  DOB: 1946/07/27  MRN: [id number]  Primary Cardiologist: Alm Clay, MD  Chart reviewed as part of pre-operative protocol coverage. Because of [name]'s past medical history and time since last visit, she will require a follow-up in-office visit in order to better assess preoperative cardiovascular risk.  Pre-op covering staff: - Please schedule appointment and call patient to inform them. If patient already had an upcoming appointment within acceptable timeframe, please add pre-op clearance to the appointment notes so provider is aware. - Please contact requesting surgeon's office via preferred method (i.e, phone, fax) to inform them of need for appointment prior to surgery.  Natausha Jungwirth D Taryne Kiger, NP  03/09/2024, 4:52 PM

## 2024-03-16 ENCOUNTER — Ambulatory Visit (INDEPENDENT_AMBULATORY_CARE_PROVIDER_SITE_OTHER): Admitting: Urology

## 2024-03-16 VITALS — BP 130/72 | HR 67 | Ht 62.0 in | Wt 155.0 lb

## 2024-03-16 DIAGNOSIS — Z8551 Personal history of malignant neoplasm of bladder: Secondary | ICD-10-CM | POA: Diagnosis not present

## 2024-03-16 MED ORDER — SULFAMETHOXAZOLE-TRIMETHOPRIM 800-160 MG PO TABS
1.0000 | ORAL_TABLET | Freq: Once | ORAL | Status: AC
  Administered 2024-03-16: 1 via ORAL

## 2024-03-16 MED ORDER — LIDOCAINE HCL URETHRAL/MUCOSAL 2 % EX GEL
1.0000 | Freq: Once | CUTANEOUS | Status: AC
  Administered 2024-03-16: 1 via URETHRAL

## 2024-03-16 NOTE — Progress Notes (Signed)
 Bladder cancer surveillance note     UROLOGIC HISTORY Alexa Orozco is a 78 y.o. female who originally presented with gross hematuria and on CT was found to have a large 7+ cm bladder tumor at the right lateral wall, posterior wall, and anteriorly.   Initial Diagnosis of Bladder  Year: 10/2020 Pathology: High-grade T1 urothelial cell carcinoma   11/2020: Second look TURBT-pathology showing high-grade T1, muscle present and not involved     Treatments for Bladder Cancer Initial TURBT 10/2020 TURBT second look 11/2020 Induction BCG x6 completed July 2022 mBCG #1 05/2021 mBCG #2 10/2021 mBCG#3 05/2022 mBCG#4 12/2022 mBCG#5 10/2023   AUA Risk Category High   Cystoscopy Procedure Note:   Bactrim  given for prophylaxis  After informed consent and discussion of the procedure and its risks, Alexa Orozco was positioned and prepped in the standard fashion. Cystoscopy was performed with the a flexible cystoscope. The urethra, bladder neck and entire bladder was visualized in a standard fashion.  Bladder mucosa grossly normal throughout, no tumor seen.  No abnormalities on retroflexion.  Extensive scar at right side of the bladder.  She continues to smoke, but has cut back somewhat.  Smoking cessation was discussed again, and we reviewed this is the highest risk factor for bladder cancer recurrence.  Currently on Myrbetriq  50 mg daily for OAB symptoms with moderate results  CT urogram 12/21/2022 reviewed, no evidence of recurrence  -Maintenance BCG August 2025(final 1) -Cystoscopy in 6 months, CT prior   Redell Burnet, MD 03/16/2024

## 2024-03-20 ENCOUNTER — Telehealth: Payer: Self-pay | Admitting: Family Medicine

## 2024-03-20 DIAGNOSIS — J439 Emphysema, unspecified: Secondary | ICD-10-CM

## 2024-03-20 NOTE — Telephone Encounter (Signed)
 Prescription Request  03/20/2024  LOV: 01/06/2024  What is the name of the medication or equipment?   albuterol  (VENTOLIN  HFA) 108 (90 Base) MCG/ACT inhaler   Have you contacted your pharmacy to request a refill? Yes   Which pharmacy would you like this sent to?  [id number] - [place], [place] - [address] AT [facility] [address] [place] [address] Phone: [phone] Fax: [phone]    Patient notified that their request is being sent to the clinical staff for review and that they should receive a response within 2 business days.   Please advise pharmacist.

## 2024-03-22 NOTE — Telephone Encounter (Signed)
 Rx 01/20/24 18 each 2RF- too soon Requested Prescriptions  Pending Prescriptions Disp Refills   albuterol  (VENTOLIN  HFA) 108 (90 Base) MCG/ACT inhaler 18 each 2    Sig: USE 2 INHALATIONS ORALLY   EVERY 6 HOURS AS NEEDED FORWHEEZING OR SHORTNESS OF   BREATH     Pulmonology:  Beta Agonists 2 Passed - 03/22/2024  2:07 PM      Passed - Last BP in normal range    BP Readings from Last 1 Encounters:  03/16/24 130/72         Passed - Last Heart Rate in normal range    Pulse Readings from Last 1 Encounters:  03/16/24 67         Passed - Valid encounter within last 12 months    Recent Outpatient Visits           2 months ago Toe pain, right   [place] [facility] Pickard, Butler DASEN, MD   6 months ago Essential hypertension   [place] [facility] Duanne, Butler DASEN, MD   1 year ago Other fatigue   [place] [facility] Duanne, Butler DASEN, MD   1 year ago Encounter for screening mammogram for malignant neoplasm of breast    [facility] Pickard, Butler DASEN, MD       Future Appointments             In 6 days Rana Lum CROME, NP [facility] Dept of [employer] [facility] [employer], [employer]

## 2024-03-23 ENCOUNTER — Other Ambulatory Visit: Payer: Self-pay | Admitting: Urology

## 2024-03-28 ENCOUNTER — Ambulatory Visit: Attending: Internal Medicine | Admitting: Emergency Medicine

## 2024-03-28 ENCOUNTER — Encounter: Payer: Self-pay | Admitting: Emergency Medicine

## 2024-03-28 VITALS — BP 122/64 | HR 53 | Ht 62.0 in | Wt 155.0 lb

## 2024-03-28 DIAGNOSIS — Z01810 Encounter for preprocedural cardiovascular examination: Secondary | ICD-10-CM | POA: Diagnosis not present

## 2024-03-28 DIAGNOSIS — Z720 Tobacco use: Secondary | ICD-10-CM

## 2024-03-28 DIAGNOSIS — E785 Hyperlipidemia, unspecified: Secondary | ICD-10-CM | POA: Diagnosis not present

## 2024-03-28 DIAGNOSIS — I2510 Atherosclerotic heart disease of native coronary artery without angina pectoris: Secondary | ICD-10-CM | POA: Diagnosis not present

## 2024-03-28 DIAGNOSIS — I10 Essential (primary) hypertension: Secondary | ICD-10-CM | POA: Diagnosis not present

## 2024-03-28 NOTE — Patient Instructions (Signed)
 Medication Instructions:  NO CHANGES   Lab Work: CMET AND FASTING LIPID PANEL.  Testing/Procedures: NONE  Follow-Up: At [facility], you and your health needs are our priority.  As part of our continuing mission to provide you with exceptional heart care, our providers are all part of one team.  This team includes your primary Cardiologist (physician) and Advanced Practice Providers or APPs (Physician Assistants and Nurse Practitioners) who all work together to provide you with the care you need, when you need it.  Your next appointment:   6 MONTHS  Provider:   Alm Clay, MD

## 2024-03-28 NOTE — Progress Notes (Signed)
 Cardiology Office Note:    Date:  03/28/2024  ID:  Alexa Orozco, DOB 1945-11-02, MRN [id number] PCP: Duanne Butler DASEN, MD  [facility] Providers Cardiologist:  Alm Clay, MD       Patient Profile:       Chief Complaint: Preoperative clearance History of Present Illness:  Alexa Orozco is a 78 y.o. female with visit-pertinent history of coronary artery disease, hypertension, tobacco abuse, dyslipidemia, obesity, Raynaud's disease  She has history of PCI in February 2007 with Cypher DES to the RCA.  She underwent MPI on 05/2013 that was normal low risk study.  She underwent echocardiogram with PCP on 02/25/2023 showing LVEF 60 to 65%, no RWMA, RV function and size normal, no valvular abnormalities.  Patient was last seen in office over 4 years ago by Dr. Clay on 12/25/2019.  She was doing well at the time without anginal symptoms.  Her medications were continued and she was to follow-up in 1 year.  She is now pending right carpal tunnel release on 04/04/2024 with the hand Center.   Discussed the use of AI scribe software for clinical note transcription with the patient, who gave verbal consent to proceed.  History of Present Illness Alexa Orozco is a 78 year old female with coronary artery disease and COPD who presents for preoperative cardiac evaluation for an upcoming right carpal tunnel release.  Today she tells me she is doing well overall.  She has been without any episodes of chest pain or anginal symptoms over the past several years.  She has been remain compliant to her antiplatelet therapy.  Reports her blood pressure has been well-controlled.  She denies any orthopnea, PND, leg swelling, lightheadedness, dizziness, syncope, presyncope.  She has COPD with chronic shortness of breath but no chest pain during activities like housework. She manages daily activities without significant issues and has had no recent COPD exacerbations.  She smokes half a pack of cigarettes per day.  She does not engage in regular exercise but performs housework and shopping. She has no significant dietary restrictions but eats minimally throughout the day.   Review of systems:  Please see the history of present illness. All other systems are reviewed and otherwise negative.      Studies Reviewed:    EKG Interpretation Date/Time:  Tuesday March 28 2024 14:07:42 EDT Ventricular Rate:  53 PR Interval:  142 QRS Duration:  78 QT Interval:  434 QTC Calculation: 407 R Axis:   63  Text Interpretation: Sinus bradycardia When compared with ECG of 12-May-2021 17:36, PREVIOUS ECG IS PRESENT Confirmed by Rana Dixon [phone]) on 03/28/2024 2:28:56 PM    Echocardiogram 02/25/2023 1. Left ventricular ejection fraction, by estimation, is 60 to 65%. The  left ventricle has normal function. The left ventricle has no regional  wall motion abnormalities. Left ventricular diastolic parameters are  indeterminate.   2. Right ventricular systolic function is normal. The right ventricular  size is normal.   3. The mitral valve is normal in structure. No evidence of mitral valve  regurgitation. No evidence of mitral stenosis.   4. The aortic valve is normal in structure. Aortic valve regurgitation is  not visualized. No aortic stenosis is present.   5. The inferior vena cava is normal in size with greater than 50%  respiratory variability, suggesting right atrial pressure of 3 mmHg.   Echocardiogram 05/13/2021 1. Left ventricular ejection fraction, by estimation, is 60 to 65%. The  left ventricle has normal function. The left ventricle  has no regional  wall motion abnormalities. Left ventricular diastolic parameters are  consistent with Grade I diastolic  dysfunction (impaired relaxation).   2. Right ventricular systolic function is normal. The right ventricular  size is normal. There is normal pulmonary artery systolic pressure.   3. The mitral valve is normal in structure. Trivial mitral  valve  regurgitation. No evidence of mitral stenosis.   4. The aortic valve is tricuspid. Aortic valve regurgitation is not  visualized. No aortic stenosis is present.   5. The inferior vena cava is normal in size with greater than 50%  respiratory variability, suggesting right atrial pressure of 3 mmHg.   Risk Assessment/Calculations:              Physical Exam:   VS:  BP 122/64 (BP Location: Left Arm, Patient Position: Sitting, Cuff Size: Normal)   Pulse (!) 53   Ht 5' 2 (1.575 m)   Wt 155 lb (70.3 kg)   BMI 28.35 kg/m    Wt Readings from Last 3 Encounters:  03/28/24 155 lb (70.3 kg)  03/16/24 155 lb (70.3 kg)  01/06/24 158 lb 9.6 oz (71.9 kg)    GEN: Well nourished, well developed in no acute distress NECK: No JVD; No carotid bruits CARDIAC: RRR, no murmurs, rubs, gallops RESPIRATORY:  Clear to auscultation without rales, wheezing or rhonchi  ABDOMEN: Soft, non-tender, non-distended EXTREMITIES:  No edema; No acute deformity      Assessment and Plan:  Coronary artery disease S/p DES to RCA on 10/2005 MPI 05/2013 was low risk and nonischemic Echocardiogram 01/2023 with LVEF 60 to 65%, no RWMA EKG today shows sinus bradycardia without acute ischemic changes - Today and over the past several years patient has been without anginal or exertional symptoms.  She denies any chest pains.  No indication for further ischemic evaluation at this time - No BB due to bradycardia and COPD - Continue clopidogrel  75 mg daily and rosuvastatin  20 mg daily - Continue nitroglycerin  as needed  Hypertension Blood pressure today is 122/64 and well-controlled - Continue enalapril  2.5 mg daily  Hyperlipidemia, LDL goal <70 LDL 115 on 08/2023 and not well-controlled - Plan to repeat fasting lipid panel/LFTs today and titration of statin depending on results - Continue rosuvastatin  20 mg daily  Tobacco abuse Currently smoking 1 pack/day - Smoking cessation advised  Preoperative  cardiovascular clearance Right carpal tunnel release on 04/04/2024 According to the Revised Cardiac Risk Index (RCRI), her Perioperative Risk of Major Cardiac Event is (%): 0.9. Her Functional Capacity in METs is: 5.07 according to the Duke Activity Status Index (DASI). Therefore, based on ACC/AHA guidelines, patient would be at acceptable risk for the planned procedure without further cardiovascular testing. I will route this recommendation to the requesting party via Epic fax function.  Patient is no longer taking aspirin   She may hold Plavix  for 5 days prior to procedure. Please resume Plavix  as soon as possible postprocedure, at the discretion of the surgeon.        Dispo:  Return in about 6 months (around 09/28/2024).  Signed, Lum LITTIE Louis, NP

## 2024-03-29 ENCOUNTER — Other Ambulatory Visit: Payer: Self-pay | Admitting: Urology

## 2024-03-29 ENCOUNTER — Encounter (HOSPITAL_BASED_OUTPATIENT_CLINIC_OR_DEPARTMENT_OTHER): Payer: Self-pay | Admitting: Orthopedic Surgery

## 2024-03-29 ENCOUNTER — Other Ambulatory Visit: Payer: Self-pay | Admitting: Family Medicine

## 2024-03-29 ENCOUNTER — Other Ambulatory Visit: Payer: Self-pay

## 2024-03-29 DIAGNOSIS — N3281 Overactive bladder: Secondary | ICD-10-CM

## 2024-03-29 MED ORDER — MIRABEGRON ER 50 MG PO TB24: 50.0000 mg | ORAL_TABLET | Freq: Every day | ORAL | 3 refills | Status: AC

## 2024-03-29 NOTE — Progress Notes (Signed)
   03/29/24 1638  PAT Phone Screen  Is the patient taking a GLP-1 receptor agonist? No  Do You Have Diabetes? No  Do You Have Hypertension? No  Have You Ever Been to the ER for Asthma? No  Have You Taken Oral Steroids in the Past 3 Months? No  Do you Take Phenteramine or any Other Diet Drugs? No  Recent  Lab Work, EKG, CXR? Yes  Where was this test performed? 03-28-24 EKG, CMP  Do you have a history of heart problems? (S)  Yes (CAD-DES to RCA 2007)  Cardiologist Name Dr Anner  Have you ever had tests on your heart? Yes  What cardiac tests were performed? Echo  What date/year were cardiac tests completed? 02-25-23 ECHO EF 60-65%  Results viewable: [facility] Media Tab  Any Recent Hospitalizations? No  Height 5' 2 (1.575 m)  Weight 70.3 kg  Pat Appointment Scheduled No  Reason for No Appointment Not Needed

## 2024-03-31 ENCOUNTER — Other Ambulatory Visit: Payer: Self-pay | Admitting: Family Medicine

## 2024-03-31 DIAGNOSIS — I209 Angina pectoris, unspecified: Secondary | ICD-10-CM

## 2024-03-31 MED ORDER — PANTOPRAZOLE SODIUM 40 MG PO TBEC: 40.0000 mg | DELAYED_RELEASE_TABLET | Freq: Every morning | ORAL | 0 refills | Status: DC

## 2024-03-31 NOTE — Telephone Encounter (Signed)
 Prescription Request  03/31/2024  LOV: 01/06/2024  What is the name of the medication or equipment?   pantoprazole  (PROTONIX ) 40 MG tablet  **90 day script request**  Have you contacted your pharmacy to request a refill? Yes   Which pharmacy would you like this sent to?  [id number] - [place], [place] - [address] AT [facility] [place] [facility] [address] [place] [address] Phone: [phone] Fax: [phone]    Patient notified that their request is being sent to the clinical staff for review and that they should receive a response within 2 business days.   Please advise pharmacist.

## 2024-03-31 NOTE — Telephone Encounter (Signed)
 Requested Prescriptions  Pending Prescriptions Disp Refills   pantoprazole  (PROTONIX ) 40 MG tablet 90 tablet 0    Sig: Take 1 tablet (40 mg total) by mouth every morning. TAKE 1 TABLET EVERY MORNING     Gastroenterology: Proton Pump Inhibitors Passed - 03/31/2024  3:40 PM      Passed - Valid encounter within last 12 months    Recent Outpatient Visits           2 months ago Toe pain, right   [facility] Pickard, Butler DASEN, MD   7 months ago Essential hypertension   [facility] [facility] Duanne, Butler DASEN, MD   1 year ago Other fatigue   [facility] Duanne, Butler DASEN, MD   1 year ago Encounter for screening mammogram for malignant neoplasm of breast   [place] [facility] Pickard, Butler DASEN, MD

## 2024-04-04 ENCOUNTER — Other Ambulatory Visit: Payer: Self-pay

## 2024-04-04 ENCOUNTER — Ambulatory Visit (HOSPITAL_BASED_OUTPATIENT_CLINIC_OR_DEPARTMENT_OTHER): Admitting: Anesthesiology

## 2024-04-04 ENCOUNTER — Ambulatory Visit (HOSPITAL_BASED_OUTPATIENT_CLINIC_OR_DEPARTMENT_OTHER)
Admission: RE | Admit: 2024-04-04 | Discharge: 2024-04-04 | Disposition: A | Discharge status: Home / Self Care | Attending: Orthopedic Surgery | Admitting: Orthopedic Surgery

## 2024-04-04 ENCOUNTER — Encounter (HOSPITAL_BASED_OUTPATIENT_CLINIC_OR_DEPARTMENT_OTHER)
Admission: RE | Disposition: A | Discharge status: Home / Self Care | Payer: Self-pay | Source: Home / Self Care | Attending: Orthopedic Surgery

## 2024-04-04 ENCOUNTER — Encounter (HOSPITAL_BASED_OUTPATIENT_CLINIC_OR_DEPARTMENT_OTHER): Payer: Self-pay | Admitting: Orthopedic Surgery

## 2024-04-04 DIAGNOSIS — G5601 Carpal tunnel syndrome, right upper limb: Secondary | ICD-10-CM

## 2024-04-04 DIAGNOSIS — E039 Hypothyroidism, unspecified: Secondary | ICD-10-CM | POA: Insufficient documentation

## 2024-04-04 DIAGNOSIS — K219 Gastro-esophageal reflux disease without esophagitis: Secondary | ICD-10-CM | POA: Insufficient documentation

## 2024-04-04 DIAGNOSIS — J449 Chronic obstructive pulmonary disease, unspecified: Secondary | ICD-10-CM | POA: Diagnosis not present

## 2024-04-04 DIAGNOSIS — Z8551 Personal history of malignant neoplasm of bladder: Secondary | ICD-10-CM | POA: Insufficient documentation

## 2024-04-04 DIAGNOSIS — Z79899 Other long term (current) drug therapy: Secondary | ICD-10-CM | POA: Diagnosis not present

## 2024-04-04 DIAGNOSIS — Z01818 Encounter for other preprocedural examination: Secondary | ICD-10-CM

## 2024-04-04 DIAGNOSIS — I2510 Atherosclerotic heart disease of native coronary artery without angina pectoris: Secondary | ICD-10-CM

## 2024-04-04 DIAGNOSIS — F17200 Nicotine dependence, unspecified, uncomplicated: Secondary | ICD-10-CM

## 2024-04-04 DIAGNOSIS — I252 Old myocardial infarction: Secondary | ICD-10-CM | POA: Diagnosis not present

## 2024-04-04 DIAGNOSIS — Z7951 Long term (current) use of inhaled steroids: Secondary | ICD-10-CM | POA: Diagnosis not present

## 2024-04-04 DIAGNOSIS — F17210 Nicotine dependence, cigarettes, uncomplicated: Secondary | ICD-10-CM | POA: Insufficient documentation

## 2024-04-04 DIAGNOSIS — Z955 Presence of coronary angioplasty implant and graft: Secondary | ICD-10-CM | POA: Insufficient documentation

## 2024-04-04 DIAGNOSIS — F418 Other specified anxiety disorders: Secondary | ICD-10-CM | POA: Diagnosis not present

## 2024-04-04 SURGERY — CARPAL TUNNEL RELEASE
Anesthesia: Monitor Anesthesia Care | Site: Wrist | Laterality: Right

## 2024-04-04 MED ORDER — MIDAZOLAM HCL 2 MG/2ML IJ SOLN: 0.5000 mg | Freq: Once | INTRAMUSCULAR | Status: DC | PRN

## 2024-04-04 MED ORDER — BUPIVACAINE HCL (PF) 0.25 % IJ SOLN
INTRAMUSCULAR | Status: DC | PRN
  Administered 2024-04-04: 9 mL

## 2024-04-04 MED ORDER — ONDANSETRON HCL 4 MG/2ML IJ SOLN
INTRAMUSCULAR | Status: DC | PRN
  Administered 2024-04-04: 4 mg via INTRAVENOUS

## 2024-04-04 MED ORDER — ACETAMINOPHEN 500 MG PO TABS
ORAL_TABLET | ORAL | Status: AC
Start: 2024-04-04 — End: 2024-04-04
  Filled 2024-04-04: qty 2

## 2024-04-04 MED ORDER — BUPIVACAINE HCL (PF) 0.25 % IJ SOLN
INTRAMUSCULAR | Status: AC
  Filled 2024-04-04: qty 30

## 2024-04-04 MED ORDER — ONDANSETRON HCL 4 MG/2ML IJ SOLN
INTRAMUSCULAR | Status: AC
  Filled 2024-04-04: qty 2

## 2024-04-04 MED ORDER — HYDROCODONE-ACETAMINOPHEN 5-325 MG PO TABS: 1.0000 | ORAL_TABLET | Freq: Four times a day (QID) | ORAL | 0 refills | Status: DC | PRN

## 2024-04-04 MED ORDER — FENTANYL CITRATE (PF) 100 MCG/2ML IJ SOLN
INTRAMUSCULAR | Status: AC
Start: 2024-04-04 — End: 2024-04-04
  Filled 2024-04-04: qty 2

## 2024-04-04 MED ORDER — PROPOFOL 500 MG/50ML IV EMUL
INTRAVENOUS | Status: DC | PRN
  Administered 2024-04-04: 50 ug/kg/min via INTRAVENOUS

## 2024-04-04 MED ORDER — LACTATED RINGERS IV SOLN: INTRAVENOUS | Status: DC

## 2024-04-04 MED ORDER — FENTANYL CITRATE (PF) 100 MCG/2ML IJ SOLN
INTRAMUSCULAR | Status: DC | PRN
  Administered 2024-04-04: 50 ug via INTRAVENOUS

## 2024-04-04 MED ORDER — 0.9 % SODIUM CHLORIDE (POUR BTL) OPTIME
TOPICAL | Status: DC | PRN
Start: 2024-04-04 — End: 2024-04-04
  Administered 2024-04-04: 120 mL

## 2024-04-04 MED ORDER — PROPOFOL 10 MG/ML IV BOLUS
INTRAVENOUS | Status: DC | PRN
  Administered 2024-04-04: 20 mg via INTRAVENOUS
  Administered 2024-04-04: 10 mg via INTRAVENOUS

## 2024-04-04 MED ORDER — LIDOCAINE HCL (PF) 0.5 % IJ SOLN
INTRAMUSCULAR | Status: DC | PRN
  Administered 2024-04-04: 30 mL via INTRAVENOUS

## 2024-04-04 MED ORDER — ACETAMINOPHEN 500 MG PO TABS
1000.0000 mg | ORAL_TABLET | Freq: Once | ORAL | Status: AC
  Administered 2024-04-04: 1000 mg via ORAL

## 2024-04-04 MED ORDER — OXYCODONE HCL 5 MG PO TABS: 5.0000 mg | ORAL_TABLET | Freq: Once | ORAL | Status: DC | PRN

## 2024-04-04 MED ORDER — OXYCODONE HCL 5 MG/5ML PO SOLN: 5.0000 mg | Freq: Once | ORAL | Status: DC | PRN

## 2024-04-04 MED ORDER — FENTANYL CITRATE (PF) 100 MCG/2ML IJ SOLN: 25.0000 ug | INTRAMUSCULAR | Status: DC | PRN

## 2024-04-04 MED ORDER — EPHEDRINE SULFATE (PRESSORS) 50 MG/ML IJ SOLN
INTRAMUSCULAR | Status: DC | PRN
  Administered 2024-04-04: 10 mg via INTRAVENOUS

## 2024-04-04 SURGICAL SUPPLY — 32 items
BLADE SURG 15 STRL LF DISP TIS (BLADE) ×4 IMPLANT
BNDG COMPR ESMARK 4X3 LF (GAUZE/BANDAGES/DRESSINGS) IMPLANT
BNDG ELASTIC 3INX 5YD STR LF (GAUZE/BANDAGES/DRESSINGS) ×2 IMPLANT
BNDG GAUZE DERMACEA FLUFF 4 (GAUZE/BANDAGES/DRESSINGS) ×2 IMPLANT
CHLORAPREP W/TINT 26 (MISCELLANEOUS) ×2 IMPLANT
CORD BIPOLAR FORCEPS 12FT (ELECTRODE) ×2 IMPLANT
COVER BACK TABLE 60X90IN (DRAPES) ×2 IMPLANT
COVER MAYO STAND STRL (DRAPES) ×2 IMPLANT
CUFF TOURN SGL QUICK 18X4 (TOURNIQUET CUFF) ×2 IMPLANT
DRAPE EXTREMITY T 121X128X90 (DISPOSABLE) ×2 IMPLANT
DRAPE SURG 17X23 STRL (DRAPES) ×2 IMPLANT
GAUZE PAD ABD 8X10 STRL (GAUZE/BANDAGES/DRESSINGS) ×2 IMPLANT
GAUZE SPONGE 4X4 12PLY STRL (GAUZE/BANDAGES/DRESSINGS) ×2 IMPLANT
GAUZE XEROFORM 1X8 LF (GAUZE/BANDAGES/DRESSINGS) ×2 IMPLANT
GLOVE BIO SURGEON STRL SZ7.5 (GLOVE) ×2 IMPLANT
GLOVE BIOGEL PI IND STRL 7.0 (GLOVE) IMPLANT
GLOVE BIOGEL PI IND STRL 7.5 (GLOVE) IMPLANT
GLOVE BIOGEL PI IND STRL 8 (GLOVE) ×2 IMPLANT
GOWN STRL REUS W/ TWL LRG LVL3 (GOWN DISPOSABLE) ×2 IMPLANT
GOWN STRL REUS W/ TWL XL LVL3 (GOWN DISPOSABLE) IMPLANT
GOWN STRL REUS W/TWL XL LVL3 (GOWN DISPOSABLE) ×2 IMPLANT
NDL HYPO 25X1 1.5 SAFETY (NEEDLE) ×2 IMPLANT
NEEDLE HYPO 25X1 1.5 SAFETY (NEEDLE) ×1 IMPLANT
NS IRRIG 1000ML POUR BTL (IV SOLUTION) ×2 IMPLANT
PACK BASIN DAY SURGERY FS (CUSTOM PROCEDURE TRAY) ×2 IMPLANT
PADDING CAST ABS COTTON 4X4 ST (CAST SUPPLIES) ×2 IMPLANT
STOCKINETTE 4X48 STRL (DRAPES) ×2 IMPLANT
SUT ETHILON 4 0 PS 2 18 (SUTURE) ×2 IMPLANT
SYR BULB EAR ULCER 3OZ GRN STR (SYRINGE) ×2 IMPLANT
SYR CONTROL 10ML LL (SYRINGE) ×2 IMPLANT
TOWEL GREEN STERILE FF (TOWEL DISPOSABLE) ×4 IMPLANT
UNDERPAD 30X36 HEAVY ABSORB (UNDERPADS AND DIAPERS) ×2 IMPLANT

## 2024-04-04 NOTE — Anesthesia Procedure Notes (Signed)
 Anesthesia Regional Block: Bier block (IV Regional)   Pre-Anesthetic Checklist: , timeout performed,  Correct Patient, Correct Site, Correct Laterality,  Correct Procedure,, site marked,  Surgical consent,  At surgeon's request  Laterality: Right and Lower         Needles:  Injection technique: Single-shot  Needle Type: Other      Needle Gauge: 22     Additional Needles:   Procedures:,,,,, intact distal pulses, Esmarch exsanguination,  Single tourniquet utilized    Narrative:  Start time: 04/04/2024 12:59 PM End time: 04/04/2024 12:59 PM Injection made incrementally with aspirations every 30 mL.  Performed by: Personally

## 2024-04-04 NOTE — H&P (Signed)
 Alexa Orozco is an 78 y.o. female.   Chief Complaint: carpal tunnel syndrome HPI: 78 y.o. yo female with numbness and tingling right hand.  Nocturnal symptoms. Positive nerve conduction studies. She wishes to have right carpal tunnel release.   Allergies:  Allergies  Allergen Reactions   Bystolic [Nebivolol Hcl] Hives   Cephalexin Hives    Keflex*   Effexor  [Venlafaxine ] Other (See Comments)    Made her face hot and red   Elemental Sulfur Dermatitis   Levaquin  [Levofloxacin  In D5w] Other (See Comments)    Caused face to turn red and patient became extremely hot     Past Medical History:  Diagnosis Date   Allergies    Allergy    Anxiety    Asthma    Bladder cancer (HCC)    CAD S/P percutaneous coronary angioplasty 10/2005   Class III Angina --> Cath: 85% -- PCI 3.74mm x 18mm (4.0 mm) Cypher DES to RCA; Myoview October 2014: normal stress test: LOW RISK; mild anteroapical breast attenuation   Cataract    h/o bilat repair   Claudication (HCC) 06/01/2013   COPD (chronic obstructive pulmonary disease) (HCC)    Depression 11/29/2013   Emphysema of lung (HCC)    GERD (gastroesophageal reflux disease)    History of palpitations    History of tobacco abuse    Hyperlipidemia    Hypothyroidism    Osteoporosis    Raynaud disease     Past Surgical History:  Procedure Laterality Date   ABDOMINAL HYSTERECTOMY     h/o partila hysterectomy   BREAST BIOPSY  08/31/1986   COLONOSCOPY  2006   Dr. Obie: multiple hyperplastic polyps   COLONOSCOPY  2001   adenomatous polyps   COLONOSCOPY WITH PROPOFOL  N/A 04/24/2022   Procedure: COLONOSCOPY WITH PROPOFOL ;  Surgeon: Shaaron Lamar HERO, MD;  [facility]: [facility];  Service: Endoscopy;  Laterality: N/A;  1:00pm, asa 3   CORONARY ANGIOPLASTY WITH STENT PLACEMENT  10/14/2005   cypher DES (3.77mmx18mm) to high grade RCA lesion   NM MYOVIEW LTD  July 2012 of October 2014   '12: dipyridamole; Normal, low risk study; 2014 - normal stress test: LOW  RISK; mild anteroapical breast attenuation   POLYPECTOMY  04/24/2022   Procedure: POLYPECTOMY;  Surgeon: Shaaron Lamar HERO, MD;  Location: [facility];  Service: Endoscopy;;   TONSILLECTOMY  08/31/1957   TRANSTHORACIC ECHOCARDIOGRAM  05/22/2009   EF=>55%, normal LV systolic function; normal RV systolic function; mild MR/TR   TRANSURETHRAL RESECTION OF BLADDER TUMOR N/A 11/08/2020   Procedure: TRANSURETHRAL RESECTION OF BLADDER TUMOR (TURBT);  Surgeon: Francisca Redell BROCKS, MD;  Location: [facility] ORS;  Service: Urology;  Laterality: N/A;   TRANSURETHRAL RESECTION OF BLADDER TUMOR N/A 12/13/2020   Procedure: TRANSURETHRAL RESECTION OF BLADDER TUMOR (TURBT);  Surgeon: Francisca Redell BROCKS, MD;  Location: [facility] ORS;  Service: Urology;  Laterality: N/A;    Family History: Family History  Problem Relation Age of Onset   Liver cancer Mother    Heart attack Mother    Heart Problems Sister    Lupus Sister    Liver cancer Brother    Asthma Brother    Breast cancer Neg Hx    Prostate cancer Neg Hx    Bladder Cancer Neg Hx    Kidney cancer Neg Hx    Colon cancer Neg Hx     Social History:   reports that she has been smoking cigarettes. She has a 17.5 pack-year smoking history. She has never  been exposed to tobacco smoke. She has never used smokeless tobacco. She reports that she does not drink alcohol and does not use drugs.  Medications: Medications Prior to Admission  Medication Sig Dispense Refill   albuterol  (VENTOLIN  HFA) 108 (90 Base) MCG/ACT inhaler USE 2 INHALATIONS ORALLY EVERY 6 HOURS AS NEEDED FORWHEEZING OR SHORTNESS OF BREATH 18 each 2   Budeson-Glycopyrrol-Formoterol  (BREZTRI  AEROSPHERE) 160-9-4.8 MCG/ACT AERO Inhale 2 puffs into the lungs 2 (two) times daily. 10.7 g 11   clonazePAM  (KLONOPIN ) 0.5 MG tablet TAKE 1 TABLET BY MOUTH TWICE A DAY AS NEEDED FOR ANXIETY 30 tablet 2   clopidogrel  (PLAVIX ) 75 MG tablet Take 1 tablet (75 mg total) by mouth daily. 90 tablet 3   diclofenac  Sodium  (VOLTAREN ) 1 % GEL APPLY 2 G TOPICALLY 4 (FOUR) TIMES DAILY AS NEEDED (PAIN). 100 g 1   gabapentin  (NEURONTIN ) 300 MG capsule Take 1 capsule (300 mg total) by mouth 3 (three) times daily as needed (nerve pain). 90 capsule 3   levothyroxine  (SYNTHROID ) 88 MCG tablet TAKE 1 TABLET DAILY 90 tablet 3   meclizine  (ANTIVERT ) 25 MG tablet Take 0.5 tablets (12.5 mg total) by mouth 3 (three) times daily as needed for dizziness. 30 tablet 0   mirabegron  ER (MYRBETRIQ ) 50 MG TB24 tablet Take 1 tablet (50 mg total) by mouth daily. 90 tablet 3   montelukast  (SINGULAIR ) 10 MG tablet TAKE 1 TABLET AT BEDTIMETAKE 1 TABLET AT BEDTIME 30 tablet 0   pantoprazole  (PROTONIX ) 40 MG tablet Take 1 tablet (40 mg total) by mouth every morning. TAKE 1 TABLET EVERY MORNING 90 tablet 0   rosuvastatin  (CRESTOR ) 20 MG tablet Take 1 tablet (20 mg total) by mouth daily. Stop pravastatin  90 tablet 3   sertraline  (ZOLOFT ) 100 MG tablet TAKE 1 AND 1/2 TABLETS     DAILY 135 tablet 1   traZODone  (DESYREL ) 50 MG tablet TAKE 1 TABLET BY MOUTH AT BEDTIME AS NEEDED FOR SLEEP. 90 tablet 1   nitroGLYCERIN  (NITROSTAT ) 0.4 MG SL tablet Place 1 tablet (0.4 mg total) under the tongue every 5 (five) minutes as needed for chest pain. 25 tablet 6    No results found for this or any previous visit (from the past 48 hours).  No results found.    Blood pressure (!) 157/60, pulse (!) 58, temperature 97.7 F (36.5 C), temperature source Temporal, resp. rate 15, height 5' 2 (1.575 m), weight 71.6 kg, SpO2 98%.  General appearance: alert, cooperative, and appears stated age Head: Normocephalic, without obvious abnormality, atraumatic Neck: supple, symmetrical, trachea midline Extremities: Intact capillary refill all digits.  +epl/fpl/io.  No wounds.  Skin: Skin color, texture, turgor normal. No rashes or lesions Neurologic: Grossly normal Incision/Wound: none  Assessment/Plan Right carpal tunnel syndrome.  Non operative and operative  treatment options have been discussed with the patient and patient wishes to proceed with operative treatment. Risks, benefits, and alternatives of surgery have been discussed and the patient agrees with the plan of care.   Alexa Orozco 04/04/2024, 12:33 PM

## 2024-04-04 NOTE — Anesthesia Preprocedure Evaluation (Addendum)
 Anesthesia Evaluation  Patient identified by MRN, date of birth, ID band Patient awake    Reviewed: Allergy & Precautions, NPO status , Patient's Chart, lab work & pertinent test results  History of Anesthesia Complications Negative for: history of anesthetic complications  Airway Mallampati: II  TM Distance: >3 FB Neck ROM: Full    Dental  (+) Edentulous Upper, Upper Dentures, Edentulous Lower, Dental Advisory Given   Pulmonary COPD,  COPD inhaler, Current SmokerPatient did not abstain from smoking.   breath sounds clear to auscultation       Cardiovascular (-) angina + CAD, + Past MI and + Cardiac Stents (RCA)   Rhythm:Regular Rate:Normal  01/2023 ECHO: EF 60 to 65%. 1. The LV has normal function, no regional wall motion abnormalities.   2. Right ventricular systolic function is normal. The right ventricular size is normal.   3. The mitral valve is normal in structure. No evidence of mitral valve regurgitation. No evidence of mitral stenosis.   4. The aortic valve is normal in structure. Aortic valve regurgitation is not visualized. No aortic stenosis is present.     Neuro/Psych   Anxiety Depression    negative neurological ROS     GI/Hepatic Neg liver ROS,GERD  Controlled and Medicated,,  Endo/Other  Hypothyroidism    Renal/GU negative Renal ROS   Bladder cancer    Musculoskeletal   Abdominal   Peds  Hematology plavix    Anesthesia Other Findings   Reproductive/Obstetrics                              Anesthesia Physical Anesthesia Plan  ASA: 3  Anesthesia Plan: MAC and Bier Block and Bier Block-Lidocaine  Only   Post-op Pain Management: Tylenol  PO (pre-op)*   Induction:   PONV Risk Score and Plan: 1 and Treatment may vary due to age or medical condition  Airway Management Planned: Natural Airway and Simple Face Mask  Additional Equipment: None  Intra-op Plan:    Post-operative Plan:   Informed Consent: I have reviewed the patients History and Physical, chart, labs and discussed the procedure including the risks, benefits and alternatives for the proposed anesthesia with the patient or authorized representative who has indicated his/her understanding and acceptance.     Dental advisory given  Plan Discussed with: CRNA and Surgeon  Anesthesia Plan Comments:          Anesthesia Quick Evaluation

## 2024-04-04 NOTE — Discharge Instructions (Addendum)
 Hand Center Instructions Hand Surgery  Wound Care: Keep your hand elevated above the level of your heart.  Do not allow it to dangle by your side.  Keep the dressing dry and do not remove it unless your doctor advises you to do so.  He will usually change it at the time of your post-op visit.  Moving your fingers is advised to stimulate circulation but will depend on the site of your surgery.  If you have a splint applied, your doctor will advise you regarding movement.  Activity: Do not drive or operate machinery today.  Rest today and then you may return to your normal activity and work as indicated by your physician.  Diet:  Drink liquids today or eat a light diet.  You may resume a regular diet tomorrow.    General expectations: Pain for two to three days. Fingers may become slightly swollen.  Call your doctor if any of the following occur: Severe pain not relieved by pain medication. Elevated temperature. Dressing soaked with blood. Inability to move fingers. White or bluish color to fingers.    Post Anesthesia Home Care Instructions  Activity: Get plenty of rest for the remainder of the day. A responsible individual must stay with you for 24 hours following the procedure.  For the next 24 hours, DO NOT: -Drive a car -Advertising copywriter -Drink alcoholic beverages -Take any medication unless instructed by your physician -Make any legal decisions or sign important papers.  Meals: Start with liquid foods such as gelatin or soup. Progress to regular foods as tolerated. Avoid greasy, spicy, heavy foods. If nausea and/or vomiting occur, drink only clear liquids until the nausea and/or vomiting subsides. Call your physician if vomiting continues.  Special Instructions/Symptoms: Your throat may feel dry or sore from the anesthesia or the breathing tube placed in your throat during surgery. If this causes discomfort, gargle with warm salt water. The discomfort should disappear  within 24 hours.  If you had a scopolamine patch placed behind your ear for the management of post- operative nausea and/or vomiting:  1. The medication in the patch is effective for 72 hours, after which it should be removed.  Wrap patch in a tissue and discard in the trash. Wash hands thoroughly with soap and water. 2. You may remove the patch earlier than 72 hours if you experience unpleasant side effects which may include dry mouth, dizziness or visual disturbances. 3. Avoid touching the patch. Wash your hands with soap and water after contact with the patch.    Last Tylenol  given at 11:52am today

## 2024-04-04 NOTE — Anesthesia Postprocedure Evaluation (Signed)
 Anesthesia Post Note  Patient: Alexa Orozco  Procedure(s) Performed: CARPAL TUNNEL RELEASE (Right: Wrist)     Patient location during evaluation: PACU Anesthesia Type: MAC Level of consciousness: oriented, awake and alert and patient cooperative Pain management: pain level controlled Vital Signs Assessment: post-procedure vital signs reviewed and stable Respiratory status: spontaneous breathing, nonlabored ventilation and respiratory function stable Cardiovascular status: blood pressure returned to baseline and stable Postop Assessment: no apparent nausea or vomiting and able to ambulate Anesthetic complications: no   No notable events documented.  Last Vitals:  Vitals:   04/04/24 1330 04/04/24 1411  BP: (!) 129/58 (!) 141/49  Pulse: (!) 58 (!) 54  Resp: 14 16  Temp:  (!) 36.2 C  SpO2: 100% 94%    Last Pain:  Vitals:   04/04/24 1411  TempSrc: Temporal  PainSc: 0-No pain                 Tonya Wantz,E. Malani Lees

## 2024-04-04 NOTE — Transfer of Care (Signed)
 Immediate Anesthesia Transfer of Care Note  Patient: Kortne All  Procedure(s) Performed: CARPAL TUNNEL RELEASE (Right: Wrist)  Patient Location: PACU  Anesthesia Type:MAC and Bier block  Level of Consciousness: sedated  Airway & Oxygen Therapy: Patient Spontanous Breathing and Patient connected to face mask oxygen  Post-op Assessment: Report given to RN and Post -op Vital signs reviewed and stable  Post vital signs: Reviewed and stable  Last Vitals:  Vitals Value Taken Time  BP 127/60 04/04/24 13:27  Temp 36.2 C 04/04/24 13:27  Pulse 58 04/04/24 13:29  Resp 14 04/04/24 13:29  SpO2 100 % 04/04/24 13:29  Vitals shown include unfiled device data.  Last Pain:  Vitals:   04/04/24 1129  TempSrc: Temporal  PainSc: 0-No pain         Complications: No notable events documented.

## 2024-04-04 NOTE — Op Note (Signed)
 04/04/2024 [facility]                              OPERATIVE REPORT   PREOPERATIVE DIAGNOSIS:  Right carpal tunnel syndrome  POSTOPERATIVE DIAGNOSIS:  Right carpal tunnel syndrome  PROCEDURE:  Right carpal tunnel release  SURGEON:  Franky Curia, MD  ASSISTANT:  none.  ANESTHESIA: Bier block with sedation  IV FLUIDS:  Per anesthesia flow sheet  ESTIMATED BLOOD LOSS:  Minimal  COMPLICATIONS:  None  SPECIMENS:  None  TOURNIQUET TIME:    Total Tourniquet Time Documented: Forearm (Right) - 21 minutes Total: Forearm (Right) - 21 minutes   DISPOSITION:  Stable to PACU  LOCATION: [facility]  INDICATIONS:  78 y.o. yo female with numbness and tingling right hand.  Nocturnal symptoms. Positive nerve conduction studies. She wishes to proceed with right carpal tunnel release.  Risks, benefits and alternatives of surgery were discussed including the risk of blood loss; infection; damage to nerves, vessels, tendons, ligaments, bone; failure of surgery; need for additional surgery; complications with wound healing; continued pain; recurrence of carpal tunnel syndrome; and damage to motor branch. She voiced understanding of these risks and elected to proceed.   OPERATIVE COURSE:  After being identified preoperatively by myself, the patient and I agreed upon the procedure and site of procedure.  The surgical site was marked.  Surgical consent had been signed.  Antibiotics were held for this soft tissue procedure.  She was transferred to the operating room and placed on the operating room table in supine position with the Right upper extremity on an armboard.  Bier block anesthesia was induced by the anesthesiologist.  Right upper extremity was prepped and draped in normal sterile orthopaedic fashion.  A surgical pause was performed between the surgeons, anesthesia, and operating room staff, and all were in agreement as to the patient, procedure, and site of procedure.   Tourniquet at the proximal aspect of the forearm had been inflated for the Bier block  Incision was made over the transverse carpal ligament and carried into the subcutaneous tissues by spreading technique.  Bipolar electrocautery was used to obtain hemostasis.  The palmar fascia was sharply incised.  The transverse carpal ligament was identified.  The fascia distal to the ligament was opened.  Retractor was placed and the flexor tendons were identified.  The flexor tendon to the little finger was identified and retracted radially.  The transverse carpal ligament was then incised from distal to proximal under direct visualization.  Scissors were used to split the distal aspect of the volar antebrachial fascia.  A finger was placed into the wound to ensure complete decompression, which was the case.  The nerve was examined.  There was a persistent median artery.  The motor branch was identified and was intact.  The wound was copiously irrigated with sterile saline.  It was then closed with 4-0 nylon in a horizontal mattress fashion.  It was injected with 0.25% plain Marcaine  to aid in postoperative analgesia.  It was dressed with sterile Xeroform, 4x4s, an ABD, and wrapped with Kerlix and an Ace bandage.  Tourniquet was deflated at 21 minutes.  Fingertips were pink with brisk capillary refill after deflation of the tourniquet.  Operative drapes were broken down.  The patient was awoken from anesthesia safely.  She was transferred back to stretcher and taken to the PACU in stable condition.  I will see her back  in the office in 1 week for postoperative followup.  I will give her a prescription for Norco 5/325 1 tab PO q6 hours prn pain, dispense #15.    Kelsha Older, MD Electronically signed, 04/04/24

## 2024-04-05 ENCOUNTER — Encounter (HOSPITAL_BASED_OUTPATIENT_CLINIC_OR_DEPARTMENT_OTHER): Payer: Self-pay | Admitting: Orthopedic Surgery

## 2024-04-06 ENCOUNTER — Telehealth: Payer: Self-pay

## 2024-04-06 NOTE — Telephone Encounter (Signed)
 LMTRC  Pt needs BCG x 3 in August or Sept.

## 2024-04-11 NOTE — Telephone Encounter (Signed)
 LMTRC

## 2024-04-14 ENCOUNTER — Other Ambulatory Visit: Payer: Self-pay | Admitting: Family Medicine

## 2024-04-14 ENCOUNTER — Other Ambulatory Visit: Payer: Self-pay | Admitting: Urology

## 2024-04-14 DIAGNOSIS — J439 Emphysema, unspecified: Secondary | ICD-10-CM

## 2024-04-14 NOTE — Telephone Encounter (Signed)
 Patient returned call to schedule BCG treatment. Please advise

## 2024-04-14 NOTE — Telephone Encounter (Signed)
 Prescription Request  04/14/2024  LOV: 01/06/2024  What is the name of the medication or equipment?   albuterol  (VENTOLIN  HFA) 108 (90 Base) MCG/ACT inhaler   Have you contacted your pharmacy to request a refill? Yes   Which pharmacy would you like this sent to?  [id number] - [place], [place] - [address] AT [facility] [address] [place] [address] Phone: [phone] Fax: [phone]    Patient notified that their request is being sent to the clinical staff for review and that they should receive a response within 2 business days.   Please advise pharmacist.

## 2024-04-17 NOTE — Telephone Encounter (Signed)
 Pt returned call and was scheduled for BCG treatments fro 9/18, 9/25 and 10/2 all at 2:20 pm with Sam.

## 2024-04-17 NOTE — Telephone Encounter (Signed)
 Added to Urology PA list.

## 2024-04-18 MED ORDER — ALBUTEROL SULFATE HFA 108 (90 BASE) MCG/ACT IN AERS: INHALATION_SPRAY | RESPIRATORY_TRACT | 0 refills | Status: AC

## 2024-04-18 NOTE — Telephone Encounter (Signed)
 Requested Prescriptions  Pending Prescriptions Disp Refills   albuterol  (VENTOLIN  HFA) 108 (90 Base) MCG/ACT inhaler 18 each 0    Sig: USE 2 INHALATIONS ORALLY   EVERY 6 HOURS AS NEEDED FORWHEEZING OR SHORTNESS OF   BREATH     Pulmonology:  Beta Agonists 2 Failed - 04/18/2024 10:06 AM      Failed - Last BP in normal range    BP Readings from Last 1 Encounters:  04/04/24 (!) 141/49         Passed - Last Heart Rate in normal range    Pulse Readings from Last 1 Encounters:  04/04/24 (!) 54         Passed - Valid encounter within last 12 months    Recent Outpatient Visits           3 months ago Toe pain, right   [place] [facility] Pickard, Butler DASEN, MD   7 months ago Essential hypertension   [place] [facility] Pickard, Butler DASEN, MD   1 year ago Other fatigue   [place] [facility] Duanne, Butler DASEN, MD   1 year ago Encounter for screening mammogram for malignant neoplasm of breast   [place] [facility] Pickard, Butler DASEN, MD       Future Appointments             In 1 month Vaillancourt, Lucie, PA-C [facility] [place]   In 1 month Vaillancourt, [place], PA-C [facility] [place]   In 1 month Maurine Lucie, [facility] [place]

## 2024-04-18 NOTE — Telephone Encounter (Signed)
 Requested Prescriptions  Pending Prescriptions Disp Refills   gabapentin  (NEURONTIN ) 300 MG capsule [Pharmacy Med Name: GABAPENTIN  300 MG CAPSULE] 270 capsule 0    Sig: TAKE 1 CAPSULE (300 MG TOTAL) BY MOUTH 3 (THREE) TIMES DAILY AS NEEDED (NERVE PAIN).     Neurology: Anticonvulsants - gabapentin  Failed - 04/18/2024  8:55 AM      Failed - Completed PHQ-2 or PHQ-9 in the last 360 days      Passed - Cr in normal range and within 360 days    Creat  Date Value Ref Range Status  08/23/2023 0.81 0.60 - 1.00 mg/dL Final         Passed - Valid encounter within last 12 months    Recent Outpatient Visits           3 months ago Toe pain, right   [place] [facility] Pickard, Butler DASEN, MD   7 months ago Essential hypertension   [place] [facility] Duanne, Butler DASEN, MD   1 year ago Other fatigue   [place] [facility] Duanne, Butler DASEN, MD   1 year ago Encounter for screening mammogram for malignant neoplasm of breast   [place] [facility] Pickard, Butler DASEN, MD       Future Appointments             In 1 month Vaillancourt, Samantha, PA-C [place] [facility] [place]   In 1 month Vaillancourt, [place], PA-C [facility] [place]   In 1 month Maurine Lukes, [facility] [place]

## 2024-04-24 ENCOUNTER — Ambulatory Visit
Admission: EM | Admit: 2024-04-24 | Discharge: 2024-04-24 | Disposition: A | Discharge status: Home / Self Care | Attending: Nurse Practitioner | Admitting: Nurse Practitioner

## 2024-04-24 DIAGNOSIS — S90424D Blister (nonthermal), right lesser toe(s), subsequent encounter: Secondary | ICD-10-CM | POA: Diagnosis not present

## 2024-04-24 DIAGNOSIS — S90424S Blister (nonthermal), right lesser toe(s), sequela: Secondary | ICD-10-CM | POA: Diagnosis not present

## 2024-04-24 MED ORDER — MUPIROCIN 2 % EX OINT
1.0000 | TOPICAL_OINTMENT | Freq: Two times a day (BID) | CUTANEOUS | 0 refills | Status: AC
Start: 2024-04-24 — End: ?

## 2024-04-24 NOTE — Discharge Instructions (Signed)
 Apply medication as prescribed. You may take over-the-counter Tylenol  as needed for pain, fever, or general discomfort. As discussed, recommend over-the-counter callus pads to place to the affected area. Make sure you are continuing to wear shoes with good insole and support. Continue warm Epsom salt soaks to help with toe pain or discomfort. As discussed, if symptoms fail to improve with this treatment, recommend follow-up with podiatry as scheduled. Follow-up as needed.

## 2024-04-24 NOTE — ED Provider Notes (Signed)
 [facility]    CSN: [id number] Arrival date & time: 04/24/24  1441      History   Chief Complaint Chief Complaint  Patient presents with   Toe Pain    HPI Alexa Orozco is a 78 y.o. female.   The history is provided by the patient.   Patient presents for complaints of pain and tenderness to the right small toe.  Patient states symptoms have been present for the past 5 months.  Patient states that the area is tender, and develops a burning sensation when it is touched.  She denies injury, trauma, numbness, tingling, radiation of pain, or the inability to bear weight.  Patient states that she saw podiatry in the past and was told that arthritis.  Patient states that she used a corn and callus remover and since that time, the area has remained red.  She states she has been wearing shoes have size larger, she attempted to use callus pads, and has been applying Neosporin.  Patient states that she has scheduled an appointment with podiatry for next month.  Past Medical History:  Diagnosis Date   Allergies    Allergy    Anxiety    Asthma    Bladder cancer (HCC)    CAD S/P percutaneous coronary angioplasty 10/2005   Class III Angina --> Cath: 85% -- PCI 3.13mm x 18mm (4.0 mm) Cypher DES to RCA; Myoview October 2014: normal stress test: LOW RISK; mild anteroapical breast attenuation   Cataract    h/o bilat repair   Claudication (HCC) 06/01/2013   COPD (chronic obstructive pulmonary disease) (HCC)    Depression 11/29/2013   Emphysema of lung (HCC)    GERD (gastroesophageal reflux disease)    History of palpitations    History of tobacco abuse    Hyperlipidemia    Hypothyroidism    Osteoporosis    Raynaud disease     Patient Active Problem List   Diagnosis Date Noted   Positive colorectal cancer screening using Cologuard test 03/26/2022   Constipation 03/26/2022   Primary osteoarthritis of both first carpometacarpal joints 08/18/2021   Right hand pain 08/18/2021    NSTEMI (non-ST elevated myocardial infarction) (HCC) 05/12/2021   Bladder cancer (HCC) 05/12/2021   COVID-19 virus infection 05/12/2021   Acute on chronic respiratory failure with hypoxia (HCC) 05/12/2021   Contracture of palmar fascia 06/12/2020   Trigger middle finger of right hand 06/12/2020   Insomnia 09/19/2014   Generalized anxiety disorder 09/19/2014   Depression 11/29/2013   Osteoporosis    Obesity (BMI 30-39.9) 05/31/2013   Stable angina (HCC) 05/31/2013   Claudication of left lower extremity (HCC) 05/31/2013   CAD S/P percutaneous coronary angioplasty    Dyslipidemia    Hirsutism 03/03/2011   Essential hypertension    Hypothyroid    Raynaud's disease    COPD (chronic obstructive pulmonary disease) (HCC)    Tobacco abuse     Past Surgical History:  Procedure Laterality Date   ABDOMINAL HYSTERECTOMY     h/o partila hysterectomy   BREAST BIOPSY  08/31/1986   CARPAL TUNNEL RELEASE Right 04/04/2024   Procedure: CARPAL TUNNEL RELEASE;  Surgeon: Murrell Drivers, MD;  Location: [facility];  Service: Orthopedics;  Laterality: Right;   COLONOSCOPY  2006   Dr. Obie: multiple hyperplastic polyps   COLONOSCOPY  2001   adenomatous polyps   COLONOSCOPY WITH PROPOFOL  N/A 04/24/2022   Procedure: COLONOSCOPY WITH PROPOFOL ;  Surgeon: Shaaron Lamar HERO, MD;  [facility];  Service: Endoscopy;  Laterality: N/A;  1:00pm, asa 3   CORONARY ANGIOPLASTY WITH STENT PLACEMENT  10/14/2005   cypher DES (3.25mmx18mm) to high grade RCA lesion   NM MYOVIEW LTD  July 2012 of October 2014   '12: dipyridamole; Normal, low risk study; 2014 - normal stress test: LOW RISK; mild anteroapical breast attenuation   POLYPECTOMY  04/24/2022   Procedure: POLYPECTOMY;  Surgeon: Shaaron Lamar HERO, MD;  [facility];  Service: Endoscopy;;   TONSILLECTOMY  08/31/1957   TRANSTHORACIC ECHOCARDIOGRAM  05/22/2009   EF=>55%, normal LV systolic function; normal RV systolic function; mild  MR/TR   TRANSURETHRAL RESECTION OF BLADDER TUMOR N/A 11/08/2020   Procedure: TRANSURETHRAL RESECTION OF BLADDER TUMOR (TURBT);  Surgeon: Francisca Redell BROCKS, MD;  Location: [facility];  Service: Urology;  Laterality: N/A;   TRANSURETHRAL RESECTION OF BLADDER TUMOR N/A 12/13/2020   Procedure: TRANSURETHRAL RESECTION OF BLADDER TUMOR (TURBT);  Surgeon: Francisca Redell BROCKS, MD;  Location: [facility];  Service: Urology;  Laterality: N/A;    OB History   No obstetric history on file.      Home Medications    Prior to Admission medications   Medication Sig Start Date End Date Taking? Authorizing Provider  albuterol  (VENTOLIN  HFA) 108 (90 Base) MCG/ACT inhaler USE 2 INHALATIONS ORALLY   EVERY 6 HOURS AS NEEDED FORWHEEZING OR SHORTNESS OF   BREATH 04/18/24  Yes Duanne Butler DASEN, MD  Budeson-Glycopyrrol-Formoterol  (BREZTRI  AEROSPHERE) 160-9-4.8 MCG/ACT AERO Inhale 2 puffs into the lungs 2 (two) times daily. 01/15/22  Yes Duanne Butler DASEN, MD  clopidogrel  (PLAVIX ) 75 MG tablet Take 1 tablet (75 mg total) by mouth daily. 09/14/23  Yes Duanne Butler DASEN, MD  gabapentin  (NEURONTIN ) 300 MG capsule TAKE 1 CAPSULE (300 MG TOTAL) BY MOUTH 3 (THREE) TIMES DAILY AS NEEDED (NERVE PAIN). 04/18/24  Yes Duanne Butler DASEN, MD  HYDROcodone -acetaminophen  (NORCO/VICODIN) 5-325 MG tablet Take 1 tablet by mouth every 6 (six) hours as needed for moderate pain (pain score 4-6). 04/04/24  Yes Murrell Drivers, MD  levothyroxine  (SYNTHROID ) 88 MCG tablet TAKE 1 TABLET DAILY 02/04/24  Yes Duanne Butler DASEN, MD  montelukast  (SINGULAIR ) 10 MG tablet TAKE 1 TABLET AT BEDTIMETAKE 1 TABLET AT BEDTIME 06/25/23  Yes Duanne Butler DASEN, MD  pantoprazole  (PROTONIX ) 40 MG tablet Take 1 tablet (40 mg total) by mouth every morning. TAKE 1 TABLET EVERY MORNING 03/31/24  Yes Duanne Butler DASEN, MD  rosuvastatin  (CRESTOR ) 20 MG tablet Take 1 tablet (20 mg total) by mouth daily. Stop pravastatin  08/27/23  Yes Duanne Butler DASEN, MD  sertraline  (ZOLOFT ) 100 MG  tablet TAKE 1 AND 1/2 TABLETS     DAILY 03/08/24  Yes Duanne Butler DASEN, MD  traZODone  (DESYREL ) 50 MG tablet TAKE 1 TABLET BY MOUTH AT BEDTIME AS NEEDED FOR SLEEP. 03/29/24  Yes Duanne Butler DASEN, MD  clonazePAM  (KLONOPIN ) 0.5 MG tablet TAKE 1 TABLET BY MOUTH TWICE A DAY AS NEEDED FOR ANXIETY 01/20/24   Duanne Butler DASEN, MD  diclofenac  Sodium (VOLTAREN ) 1 % GEL APPLY 2 G TOPICALLY 4 (FOUR) TIMES DAILY AS NEEDED (PAIN). 09/14/23   Duanne Butler DASEN, MD  meclizine  (ANTIVERT ) 25 MG tablet Take 0.5 tablets (12.5 mg total) by mouth 3 (three) times daily as needed for dizziness. 07/08/21   Duanne Butler DASEN, MD  mirabegron  ER (MYRBETRIQ ) 50 MG TB24 tablet Take 1 tablet (50 mg total) by mouth daily. 03/29/24   Francisca Redell BROCKS, MD  nitroGLYCERIN  (NITROSTAT ) 0.4 MG SL tablet  Place 1 tablet (0.4 mg total) under the tongue every 5 (five) minutes as needed for chest pain. 01/08/23   Duanne Butler DASEN, MD    Family History Family History  Problem Relation Age of Onset   Liver cancer Mother    Heart attack Mother    Heart Problems Sister    Lupus Sister    Liver cancer Brother    Asthma Brother    Breast cancer Neg Hx    Prostate cancer Neg Hx    Bladder Cancer Neg Hx    Kidney cancer Neg Hx    Colon cancer Neg Hx     Social History Social History   Tobacco Use   Smoking status: Every Day    Current packs/day: 0.50    Average packs/day: 0.5 packs/day for 35.0 years (17.5 ttl pk-yrs)    Types: Cigarettes    Passive exposure: Never   Smokeless tobacco: Never  Vaping Use   Vaping status: Never Used  Substance Use Topics   Alcohol use: No   Drug use: No     Allergies   Bystolic [nebivolol hcl], Cephalexin, Effexor  [venlafaxine ], Elemental sulfur, and Levaquin  [levofloxacin  in d5w]   Review of Systems Review of Systems Per HPI  Physical Exam Triage Vital Signs ED Triage Vitals  Encounter Vitals Group     BP 04/24/24 1456 120/70     Girls Systolic BP Percentile --      Girls Diastolic  BP Percentile --      Boys Systolic BP Percentile --      Boys Diastolic BP Percentile --      Pulse Rate 04/24/24 1456 66     Resp 04/24/24 1456 16     Temp 04/24/24 1456 98.3 F (36.8 C)     Temp Source 04/24/24 1456 Oral     SpO2 04/24/24 1456 92 %     Weight --      Height --      Head Circumference --      Peak Flow --      Pain Score 04/24/24 1500 5     Pain Loc --      Pain Education --      Exclude from Growth Chart --    No data found.  Updated Vital Signs BP 120/70 (BP Location: Right Arm)   Pulse 66   Temp 98.3 F (36.8 C) (Oral)   Resp 16   SpO2 92%   Visual Acuity Right Eye Distance:   Left Eye Distance:   Bilateral Distance:    Right Eye Near:   Left Eye Near:    Bilateral Near:     Physical Exam Vitals and nursing note reviewed.  Constitutional:      General: She is not in acute distress.    Appearance: Normal appearance.  HENT:     Head: Normocephalic.  Eyes:     Extraocular Movements: Extraocular movements intact.     Pupils: Pupils are equal, round, and reactive to light.  Pulmonary:     Effort: Pulmonary effort is normal.  Musculoskeletal:     Right foot: Normal range of motion.       Feet:  Feet:     Right foot:     Skin integrity: Blister and erythema (Right small toe) present.     Toenail Condition: Right toenails are normal.  Skin:    General: Skin is warm and dry.  Neurological:     General: No focal deficit present.  Mental Status: She is alert and oriented to person, place, and time.  Psychiatric:        Mood and Affect: Mood normal.      UC Treatments / Results  Labs (all labs ordered are listed, but only abnormal results are displayed) Labs Reviewed - No data to display  EKG   Radiology No results found.  Procedures Procedures (including critical care time)  Medications Ordered in UC Medications - No data to display  Initial Impression / Assessment and Plan / UC Course  I have reviewed the triage  vital signs and the nursing notes.  Pertinent labs & imaging results that were available during my care of the patient were reviewed by me and considered in my medical decision making (see chart for details).  Blister noted to the right small toe.  The area has been present for the past 5 months per the patient's report.  There is no oozing, fluctuance, or drainage present.  Will treat with mupirocin  ointment for patient to apply topically.  Supportive care recommendations were provided and discussed with the patient to include over-the-counter analgesics, warm Epsom salt soaks, wearing comfortable shoes, and purchasing over-the-counter callus pads.  Patient advised to follow-up with podiatry as scheduled.  Patient was in agreement with this plan of care and verbalizes understanding.  All questions were answered.  Patient stable for discharge.   Final Clinical Impressions(s) / UC Diagnoses   Final diagnoses:  None   Discharge Instructions   None    ED Prescriptions   None    PDMP not reviewed this encounter.   Gilmer Etta PARAS, NP 04/24/24 1540

## 2024-04-24 NOTE — ED Triage Notes (Signed)
 Pt c/o pain burning, and redness to right pinky toe. Pt states 3 months ago she thought it was a corn developing on her toe, so she put the OTC medication on it and states it burnt her skin and the redness and pain has not gone away.

## 2024-04-26 ENCOUNTER — Telehealth: Payer: Self-pay

## 2024-04-26 NOTE — Telephone Encounter (Signed)
 Auth Submission: NO AUTH NEEDED Site of care: Urology Payer: [employer] medicare Medication & CPT/J Code(s) submitted: BCG J9030 Diagnosis Code:  Route of submission (phone, fax, portal): portal Phone # Fax # Auth type: Buy/Bill PB Units/visits requested: 81mg  once a week for 3 weeks Reference number:  Approval from: 04/26/24 to 08/26/24

## 2024-05-01 ENCOUNTER — Other Ambulatory Visit: Payer: Self-pay | Admitting: Urology

## 2024-05-12 ENCOUNTER — Other Ambulatory Visit: Payer: Self-pay | Admitting: Family Medicine

## 2024-05-12 DIAGNOSIS — I209 Angina pectoris, unspecified: Secondary | ICD-10-CM

## 2024-05-12 NOTE — Telephone Encounter (Signed)
 Called pharmacy - pt picked up rx today.

## 2024-05-12 NOTE — Telephone Encounter (Unsigned)
 Copied from CRM [id number]. Topic: Clinical - Medication Refill >> May 12, 2024 10:21 AM Tysheama G wrote: Medication: pantoprazole  (PROTONIX ) 40 MG tablet  Has the patient contacted their pharmacy? Yes (Agent: If no, request that the patient contact the pharmacy for the refill. If patient does not wish to contact the pharmacy document the reason why and proceed with request.) (Agent: If yes, when and what did the pharmacy advise?)  This is the patient's preferred pharmacy:  [id number] [place] - [address] AT [facility] [address] [place] [address] Phone: [phone] Fax: [phone]  Is this the correct pharmacy for this prescription? Yes If no, delete pharmacy and type the correct one.   Has the prescription been filled recently? No  Is the patient out of the medication? Yes  Has the patient been seen for an appointment in the last year OR does the patient have an upcoming appointment? Yes  Can we respond through MyChart? No  Agent: Please be advised that Rx refills may take up to 3 business days. We ask that you follow-up with your pharmacy.

## 2024-05-12 NOTE — Telephone Encounter (Signed)
 Requested Prescriptions  Refused Prescriptions Disp Refills   pantoprazole  (PROTONIX ) 40 MG tablet 90 tablet 0    Sig: Take 1 tablet (40 mg total) by mouth every morning. TAKE 1 TABLET EVERY MORNING     Gastroenterology: Proton Pump Inhibitors Passed - 05/12/2024  5:04 PM      Passed - Valid encounter within last 12 months    Recent Outpatient Visits           4 months ago Toe pain, right   [place] [facility] Pickard, Butler DASEN, MD   8 months ago Essential hypertension   [place] [facility] Duanne, Butler DASEN, MD   1 year ago Other fatigue   [place] [facility] Duanne, Butler DASEN, MD   1 year ago Encounter for screening mammogram for malignant neoplasm of breast   [place] [facility] Pickard, Butler DASEN, MD

## 2024-05-12 NOTE — Telephone Encounter (Signed)
 Copied from CRM [id number]. Topic: Clinical - Medication Refill >> May 12, 2024 10:21 AM Tysheama G wrote: Medication: pantoprazole  (PROTONIX ) 40 MG tablet  Has the patient contacted their pharmacy? Yes (Agent: If no, request that the patient contact the pharmacy for the refill. If patient does not wish to contact the pharmacy document the reason why and proceed with request.) (Agent: If yes, when and what did the pharmacy advise?)  This is the patient's preferred pharmacy:  [id number] [place] - [address] AT [facility] [address] [place] [address] Phone: [phone] Fax: [phone]  Is this the correct pharmacy for this prescription? Yes If no, delete pharmacy and type the correct one.   Has the prescription been filled recently? No  Is the patient out of the medication? Yes  Has the patient been seen for an appointment in the last year OR does the patient have an upcoming appointment? Yes  Can we respond through MyChart? No  Agent: Please be advised that Rx refills may take up to 3 business days. We ask that you follow-up with your pharmacy.

## 2024-05-18 ENCOUNTER — Ambulatory Visit: Admitting: Physician Assistant

## 2024-05-25 ENCOUNTER — Ambulatory Visit: Admitting: Physician Assistant

## 2024-05-25 ENCOUNTER — Other Ambulatory Visit: Payer: Self-pay | Admitting: Family Medicine

## 2024-05-25 ENCOUNTER — Other Ambulatory Visit: Payer: Self-pay | Admitting: Urology

## 2024-05-25 DIAGNOSIS — I209 Angina pectoris, unspecified: Secondary | ICD-10-CM

## 2024-05-26 ENCOUNTER — Other Ambulatory Visit: Payer: Self-pay | Admitting: Family Medicine

## 2024-05-26 NOTE — Telephone Encounter (Signed)
 Prescription Request  05/26/2024  LOV: 01/06/2024  What is the name of the medication or equipment?   gabapentin  (NEURONTIN ) 300 MG capsule QTY: 270 two hundred seventy SIG: take 1 capsule (300 MG total) by mouth 3 (three) times daily as needed (nerve pain)  **90 day script request**  Have you contacted your pharmacy to request a refill? Yes   Which pharmacy would you like this sent to?  [id number] [place], [place] - [address] AT [facility] [address] [place] [address] Phone: [phone] Fax: [phone]    Patient notified that their request is being sent to the clinical staff for review and that they should receive a response within 2 business days.   Please advise pharmacist.

## 2024-05-29 MED ORDER — GABAPENTIN 300 MG PO CAPS: 300.0000 mg | ORAL_CAPSULE | Freq: Three times a day (TID) | ORAL | 0 refills | Status: AC | PRN

## 2024-05-29 NOTE — Telephone Encounter (Signed)
 Requested Prescriptions  Pending Prescriptions Disp Refills   gabapentin  (NEURONTIN ) 300 MG capsule 270 capsule 0    Sig: Take 1 capsule (300 mg total) by mouth 3 (three) times daily as needed (nerve pain).     Neurology: Anticonvulsants - gabapentin  Failed - 05/29/2024 11:37 AM      Failed - Completed PHQ-2 or PHQ-9 in the last 360 days      Passed - Cr in normal range and within 360 days    Creat  Date Value Ref Range Status  08/23/2023 0.81 0.60 - 1.00 mg/dL Final         Passed - Valid encounter within last 12 months    Recent Outpatient Visits           4 months ago Toe pain, right   [place] [facility] Pickard, Butler DASEN, MD   9 months ago Essential hypertension   [place] [facility] Duanne, Butler DASEN, MD   1 year ago Other fatigue   [place] [facility] Duanne, Butler DASEN, MD   1 year ago Encounter for screening mammogram for malignant neoplasm of breast   [place] [facility] Pickard, Butler DASEN, MD

## 2024-05-31 ENCOUNTER — Ambulatory Visit

## 2024-05-31 ENCOUNTER — Telehealth: Payer: Self-pay

## 2024-05-31 VITALS — Ht 62.0 in | Wt 157.0 lb

## 2024-05-31 DIAGNOSIS — Z0000 Encounter for general adult medical examination without abnormal findings: Secondary | ICD-10-CM

## 2024-05-31 NOTE — Progress Notes (Cosign Needed Addendum)
 Subjective:   Alexa Orozco is a 78 y.o. who presents for a Medicare Wellness preventive visit.  As a reminder, Annual Wellness Visits don't include a physical exam, and some assessments may be limited, especially if this visit is performed virtually. We may recommend an in-person follow-up visit with your provider if needed.  Visit Complete: Virtual I connected with  Alexa Orozco on 05/31/24 by a audio enabled telemedicine application and verified that I am speaking with the correct person using two identifiers.  Patient Location: Home  Provider Location: Home Office  I discussed the limitations of evaluation and management by telemedicine. The patient expressed understanding and agreed to proceed.  Vital Signs: Because this visit was a virtual/telehealth visit, some criteria may be missing or patient reported. Any vitals not documented were not able to be obtained and vitals that have been documented are patient reported.  VideoDeclined- This patient declined Librarian, academic. Therefore the visit was completed with audio only.  Persons Participating in Visit: Patient.  AWV Questionnaire: No: Patient Medicare AWV questionnaire was not completed prior to this visit.  Cardiac Risk Factors include: advanced age (>61men, >66 women);dyslipidemia;hypertension;smoking/ tobacco exposure     Objective:    Today's Vitals   05/31/24 1417  Weight: 157 lb (71.2 kg)  Height: 5' 2 (1.575 m)   Body mass index is 28.72 kg/m.     05/31/2024    2:56 PM 04/04/2024   11:25 AM 03/29/2024    4:45 PM 04/24/2022   11:09 AM 04/22/2022    9:12 AM 01/23/2022   10:07 AM 05/13/2021   12:00 AM  Advanced Directives  Does Patient Have a Medical Advance Directive? No Yes No Yes Yes No Yes  Type of Special educational needs teacher of [place];Living will  Healthcare Power of [place];Living will Healthcare Power of [place];Living will  Living will  Does patient want to make  changes to medical advance directive?  No - Patient declined     No - Patient declined  Copy of Healthcare Power of Attorney in Chart?  No - copy requested   No - copy requested    Would patient like information on creating a medical advance directive? Yes (MAU/Ambulatory/Procedural Areas - Information given)     No - Patient declined     Current Medications (verified) Outpatient Encounter Medications as of 05/31/2024  Medication Sig   albuterol  (VENTOLIN  HFA) 108 (90 Base) MCG/ACT inhaler USE 2 INHALATIONS ORALLY   EVERY 6 HOURS AS NEEDED FORWHEEZING OR SHORTNESS OF   BREATH   Budeson-Glycopyrrol-Formoterol  (BREZTRI  AEROSPHERE) 160-9-4.8 MCG/ACT AERO Inhale 2 puffs into the lungs 2 (two) times daily.   clonazePAM  (KLONOPIN ) 0.5 MG tablet TAKE 1 TABLET BY MOUTH TWICE A DAY AS NEEDED FOR ANXIETY   clopidogrel  (PLAVIX ) 75 MG tablet Take 1 tablet (75 mg total) by mouth daily.   diclofenac  Sodium (VOLTAREN ) 1 % GEL APPLY 2 G TOPICALLY 4 (FOUR) TIMES DAILY AS NEEDED (PAIN).   gabapentin  (NEURONTIN ) 300 MG capsule Take 1 capsule (300 mg total) by mouth 3 (three) times daily as needed (nerve pain).   HYDROcodone -acetaminophen  (NORCO/VICODIN) 5-325 MG tablet Take 1 tablet by mouth every 6 (six) hours as needed for moderate pain (pain score 4-6).   levothyroxine  (SYNTHROID ) 88 MCG tablet TAKE 1 TABLET DAILY   meclizine  (ANTIVERT ) 25 MG tablet Take 0.5 tablets (12.5 mg total) by mouth 3 (three) times daily as needed for dizziness.   mirabegron  ER (MYRBETRIQ ) 50 MG TB24 tablet Take  1 tablet (50 mg total) by mouth daily.   montelukast  (SINGULAIR ) 10 MG tablet TAKE 1 TABLET AT BEDTIMETAKE 1 TABLET AT BEDTIME   mupirocin  ointment (BACTROBAN ) 2 % Apply 1 Application topically 2 (two) times daily.   nitroGLYCERIN  (NITROSTAT ) 0.4 MG SL tablet Place 1 tablet (0.4 mg total) under the tongue every 5 (five) minutes as needed for chest pain.   pantoprazole  (PROTONIX ) 40 MG tablet Take 1 tablet (40 mg total) by mouth  every morning. TAKE 1 TABLET EVERY MORNING   rosuvastatin  (CRESTOR ) 20 MG tablet Take 1 tablet (20 mg total) by mouth daily. Stop pravastatin    sertraline  (ZOLOFT ) 100 MG tablet TAKE 1 AND 1/2 TABLETS     DAILY   traZODone  (DESYREL ) 50 MG tablet TAKE 1 TABLET BY MOUTH AT BEDTIME AS NEEDED FOR SLEEP.   No facility-administered encounter medications on file as of 05/31/2024.    Allergies (verified) Bystolic [nebivolol hcl], Cephalexin, Effexor  [venlafaxine ], Elemental sulfur, and Levaquin  [levofloxacin  in d5w]   History: Past Medical History:  Diagnosis Date   Allergies    Allergy    Anxiety    Asthma    Bladder cancer (HCC)    CAD S/P percutaneous coronary angioplasty 10/2005   Class III Angina --> Cath: 85% -- PCI 3.68mm x 18mm (4.0 mm) Cypher DES to RCA; Myoview October 2014: normal stress test: LOW RISK; mild anteroapical breast attenuation   Cataract    h/o bilat repair   Claudication 06/01/2013   COPD (chronic obstructive pulmonary disease) (HCC)    Depression 11/29/2013   Emphysema of lung (HCC)    GERD (gastroesophageal reflux disease)    History of palpitations    History of tobacco abuse    Hyperlipidemia    Hypothyroidism    Osteoporosis    Raynaud disease    Past Surgical History:  Procedure Laterality Date   ABDOMINAL HYSTERECTOMY     h/o partila hysterectomy   BREAST BIOPSY  08/31/1986   CARPAL TUNNEL RELEASE Right 04/04/2024   Procedure: CARPAL TUNNEL RELEASE;  Surgeon: Murrell Drivers, MD;  Location: [facility];  Service: Orthopedics;  Laterality: Right;   COLONOSCOPY  2006   Dr. Obie: multiple hyperplastic polyps   COLONOSCOPY  2001   adenomatous polyps   COLONOSCOPY WITH PROPOFOL  N/A 04/24/2022   Procedure: COLONOSCOPY WITH PROPOFOL ;  Surgeon: Shaaron Lamar HERO, MD;  [facility];  Service: Endoscopy;  Laterality: N/A;  1:00pm, asa 3   CORONARY ANGIOPLASTY WITH STENT PLACEMENT  10/14/2005   cypher DES (3.65mmx18mm) to high grade RCA  lesion   NM MYOVIEW LTD  July 2012 of October 2014   '12: dipyridamole; Normal, low risk study; 2014 - normal stress test: LOW RISK; mild anteroapical breast attenuation   POLYPECTOMY  04/24/2022   Procedure: POLYPECTOMY;  Surgeon: Shaaron Lamar HERO, MD;  [facility];  Service: Endoscopy;;   TONSILLECTOMY  08/31/1957   TRANSTHORACIC ECHOCARDIOGRAM  05/22/2009   EF=>55%, normal LV systolic function; normal RV systolic function; mild MR/TR   TRANSURETHRAL RESECTION OF BLADDER TUMOR N/A 11/08/2020   Procedure: TRANSURETHRAL RESECTION OF BLADDER TUMOR (TURBT);  Surgeon: Francisca Redell BROCKS, MD;  Location: [facility];  Service: Urology;  Laterality: N/A;   TRANSURETHRAL RESECTION OF BLADDER TUMOR N/A 12/13/2020   Procedure: TRANSURETHRAL RESECTION OF BLADDER TUMOR (TURBT);  Surgeon: Francisca Redell BROCKS, MD;  Location: [facility];  Service: Urology;  Laterality: N/A;   Family History  Problem Relation Age of Onset   Liver cancer  Mother    Heart attack Mother    Heart Problems Sister    Lupus Sister    Liver cancer Brother    Asthma Brother    Breast cancer Neg Hx    Prostate cancer Neg Hx    Bladder Cancer Neg Hx    Kidney cancer Neg Hx    Colon cancer Neg Hx    Social History   Socioeconomic History   Marital status: Widowed    Spouse name: Not on file   Number of children: 3   Years of education: 10th    Highest education level: Not on file  Occupational History   Occupation: Retired  Tobacco Use   Smoking status: Every Day    Current packs/day: 0.50    Average packs/day: 0.5 packs/day for 35.0 years (17.5 ttl pk-yrs)    Types: Cigarettes    Passive exposure: Never   Smokeless tobacco: Never  Vaping Use   Vaping status: Never Used  Substance and Sexual Activity   Alcohol use: No   Drug use: No   Sexual activity: Not Currently    Birth control/protection: Surgical    Comment: hyst  Other Topics Concern   Not on file  Social History Narrative   She is a widowed mother  of 3, grandmother for, great-grandmother 1.  She still smokes about half pack a day.  She doesn't have about 35 years.  She does not drink.   Prior to her symptoms coming on, she used to do all kind of walking around and working in the garden and other activities with her close friend.   Social Drivers of Corporate investment banker Strain: Low Risk  (05/31/2024)   Overall Financial Resource Strain (CARDIA)    Difficulty of Paying Living Expenses: Not hard at all  Food Insecurity: No Food Insecurity (05/31/2024)   Hunger Vital Sign    Worried About Running Out of Food in the Last Year: Never true    Ran Out of Food in the Last Year: Never true  Transportation Needs: No Transportation Needs (05/31/2024)   PRAPARE - Administrator, Civil Service (Medical): No    Lack of Transportation (Non-Medical): No  Physical Activity: Insufficiently Active (05/31/2024)   Exercise Vital Sign    Days of Exercise per Week: 2 days    Minutes of Exercise per Session: 20 min  Stress: No Stress Concern Present (05/31/2024)   [employer] of Occupational Health - Occupational Stress Questionnaire    Feeling of Stress: Not at all  Social Connections: Moderately Integrated (05/31/2024)   Social Connection and Isolation Panel    Frequency of Communication with Friends and Family: More than three times a week    Frequency of Social Gatherings with Friends and Family: Three times a week    Attends Religious Services: More than 4 times per year    Active Member of Clubs or Organizations: Yes    Attends Banker Meetings: More than 4 times per year    Marital Status: Widowed    Tobacco Counseling Ready to quit: Not Answered Counseling given: Not Answered    Clinical Intake:  Pre-visit preparation completed: Yes  Pain : No/denies pain  Diabetes: No   How often do you need to have someone help you when you read instructions, pamphlets, or other written materials from your doctor  or pharmacy?: 1 - Never  Interpreter Needed?: No  Information entered by :: Charmaine Bloodgood LPN   Activities of  Daily Living     05/31/2024    2:55 PM 04/04/2024   11:30 AM  In your present state of health, do you have any difficulty performing the following activities:  Hearing? 0 0  Vision? 0 0  Difficulty concentrating or making decisions? 0 0  Walking or climbing stairs? 1   Dressing or bathing? 0   Doing errands, shopping? 0   Preparing Food and eating ? N   Using the Toilet? N   In the past six months, have you accidently leaked urine? N   Do you have problems with loss of bowel control? N   Managing your Medications? N   Managing your Finances? N   Housekeeping or managing your Housekeeping? N     Patient Care Team: Duanne Butler DASEN, MD as PCP - General (Family Medicine) Anner Alm ORN, MD as PCP - Cardiology (Cardiology) [facility] Vaillancourt, Samantha, PA-C as Physician Assistant (Urology) Murrell Drivers, MD as Consulting Physician (Orthopedic Surgery)  I have updated your Care Teams any recent Medical Services you may have received from other providers in the past year.     Assessment:   This is a routine wellness examination for Caden.  Hearing/Vision screen Hearing Screening - Comments:: Patient is able to hear conversational tones without difficulty. No issues reported.   Vision Screening - Comments:: up to date with routine eye exams with [facility]    Goals Addressed             This Visit's Progress    Maintain health and independence   On track      Depression Screen     05/31/2024    2:49 PM 08/27/2023    2:06 PM 01/08/2023   12:22 PM 06/01/2022   10:17 AM 01/23/2022   10:05 AM 01/02/2021   12:15 PM 10/03/2020   12:26 PM  PHQ 2/9 Scores  PHQ - 2 Score 0  2 2 0 2 0  PHQ- 9 Score   6 8  9    Exception Documentation  Patient refusal         Fall Risk     05/31/2024    2:55 PM 08/27/2023    2:06 PM 01/08/2023    12:22 PM 06/01/2022   10:18 AM 01/23/2022   10:08 AM  Fall Risk   Falls in the past year? 0 0 0 0 0  Number falls in past yr: 0 0 0 0 0  Injury with Fall? 0 0 0 0 0  Risk for fall due to : No Fall Risks  No Fall Risks No Fall Risks No Fall Risks  Follow up Falls prevention discussed;Education provided;Falls evaluation completed  Falls evaluation completed;Education provided Falls prevention discussed  Falls prevention discussed      Data saved with a previous flowsheet row definition    MEDICARE RISK AT HOME:  Medicare Risk at Home Any stairs in or around the home?: No If so, are there any without handrails?: No Home free of loose throw rugs in walkways, pet beds, electrical cords, etc?: Yes Adequate lighting in your home to reduce risk of falls?: Yes Life alert?: No Use of a cane, walker or w/c?: No Grab bars in the bathroom?: Yes Shower chair or bench in shower?: No Elevated toilet seat or a handicapped toilet?: No  TIMED UP AND GO:  Was the test performed?  No  Cognitive Function: 6CIT completed        05/31/2024  2:55 PM 01/23/2022   10:10 AM  6CIT Screen  What Year? 0 points 0 points  What month? 0 points 0 points  What time? 0 points 0 points  Count back from 20 0 points 0 points  Months in reverse 0 points 0 points  Repeat phrase 0 points 0 points  Total Score 0 points 0 points    Immunizations Immunization History  Administered Date(s) Administered   Fluad Quad(high Dose 65+) 05/16/2021   INFLUENZA, HIGH DOSE SEASONAL PF 05/24/2014, 05/11/2019, 06/01/2022   Influenza,inj,Quad PF,6+ Mos 06/20/2015   Pneumococcal Conjugate-13 11/29/2013   Pneumococcal Polysaccharide-23 12/23/2011   Respiratory Syncytial Virus Vaccine,Recomb Aduvanted(Arexvy) 06/28/2023   Tdap 12/23/2011   Zoster Recombinant(Shingrix) 06/28/2023   Zoster, Live 01/17/2013    Screening Tests Health Maintenance  Topic Date Due   COVID-19 Vaccine (1) Never done   Pneumococcal Vaccine:  50+ Years (3 of 3 - PCV20 or PCV21) 11/30/2018   DTaP/Tdap/Td (2 - Td or Tdap) 12/22/2021   Zoster Vaccines- Shingrix (2 of 2) 08/23/2023   Influenza Vaccine  03/31/2024   Medicare Annual Wellness (AWV)  05/31/2025   DEXA SCAN  Completed   Hepatitis C Screening  Completed   HPV VACCINES  Aged Out   Meningococcal B Vaccine  Aged Out   Mammogram  Discontinued   Colonoscopy  Discontinued    Health Maintenance Items Addressed: Vaccines Due: Flu, Pneumonia, Shingrix, Tdap   Additional Screening:  Vision Screening: Recommended annual ophthalmology exams for early detection of glaucoma and other disorders of the eye. Is the patient up to date with their annual eye exam?  Yes  Who is the provider or what is the name of the office in which the patient attends annual eye exams? [facility]   Dental Screening: Recommended annual dental exams for proper oral hygiene  Community Resource Referral / Chronic Care Management: CRR required this visit?  No   CCM required this visit?  No   Plan:    I have personally reviewed and noted the following in the patient's chart:   Medical and social history Use of alcohol, tobacco or illicit drugs  Current medications and supplements including opioid prescriptions. Patient is not currently taking opioid prescriptions. Functional ability and status Nutritional status Physical activity Advanced directives List of other physicians Hospitalizations, surgeries, and ER visits in previous 12 months Vitals Screenings to include cognitive, depression, and falls Referrals and appointments  In addition, I have reviewed and discussed with patient certain preventive protocols, quality metrics, and best practice recommendations. A written personalized care plan for preventive services as well as general preventive health recommendations were provided to patient.   Lavelle Pfeiffer [place], [place]   89/03/7973   After Visit Summary: (MyChart) Due to this  being a telephonic visit, the after visit summary with patients personalized plan was offered to patient via MyChart   Notes: See telephone note

## 2024-05-31 NOTE — Telephone Encounter (Signed)
 FYI- Patient seen for AWV and states that she is having increased breathing problems with exertion.  Also has an increase in sinus drainage and feels that Singulair  is no longer working.    Offered appointment but patient declined stating that her son is having surgery soon.

## 2024-05-31 NOTE — Patient Instructions (Addendum)
 [name],  Thank you for taking the time for your Medicare Wellness Visit. I appreciate your continued commitment to your health goals. Please review the care plan we discussed, and feel free to reach out if I can assist you further.  Medicare recommends these wellness visits once per year to help you and your care team stay ahead of potential health issues. These visits are designed to focus on prevention, allowing your provider to concentrate on managing your acute and chronic conditions during your regular appointments.  Please note that Annual Wellness Visits do not include a physical exam. Some assessments may be limited, especially if the visit was conducted virtually. If needed, we may recommend a separate in-person follow-up with your provider.  Ongoing Care Seeing your primary care provider every 3 to 6 months helps us  monitor your health and provide consistent, personalized care.   Referrals If a referral was made during today's visit and you haven't received any updates within two weeks, please contact the referred provider directly to check on the status.  Recommended Screenings:  Health Maintenance  Topic Date Due   COVID-19 Vaccine (1) Never done   Pneumococcal Vaccine for age over 29 (3 of 3 - PCV20 or PCV21) 11/30/2018   DTaP/Tdap/Td vaccine (2 - Td or Tdap) 12/22/2021   Zoster (Shingles) Vaccine (2 of 2) 08/23/2023   Flu Shot  03/31/2024   Medicare Annual Wellness Visit  05/31/2025   DEXA scan (bone density measurement)  Completed   Hepatitis C Screening  Completed   HPV Vaccine  Aged Out   Meningitis B Vaccine  Aged Out   Breast Cancer Screening  Discontinued   Colon Cancer Screening  Discontinued       05/31/2024    2:56 PM  Advanced Directives  Does Patient Have a Medical Advance Directive? No  Would patient like information on creating a medical advance directive? Yes (MAU/Ambulatory/Procedural Areas - Information given)   Advance Care Planning is important  because it: Ensures you receive medical care that aligns with your values, goals, and preferences. Provides guidance to your family and loved ones, reducing the emotional burden of decision-making during critical moments.  Information on Advanced Care Planning can be found at [place]  Secretary of [facility] Advance Health Care Directives Advance Health Care Directives ([email or web address]   Vision: Annual vision screenings are recommended for early detection of glaucoma, cataracts, and diabetic retinopathy. These exams can also reveal signs of chronic conditions such as diabetes and high blood pressure.  Dental: Annual dental screenings help detect early signs of oral cancer, gum disease, and other conditions linked to overall health, including heart disease and diabetes.  Please see the attached documents for additional preventive care recommendations.

## 2024-06-01 ENCOUNTER — Ambulatory Visit: Admitting: Physician Assistant

## 2024-06-02 ENCOUNTER — Ambulatory Visit: Admitting: Family Medicine

## 2024-06-08 ENCOUNTER — Other Ambulatory Visit: Payer: Self-pay | Admitting: Family Medicine

## 2024-06-12 ENCOUNTER — Other Ambulatory Visit: Payer: Self-pay | Admitting: Physician Assistant

## 2024-06-13 ENCOUNTER — Other Ambulatory Visit: Payer: Self-pay

## 2024-06-13 ENCOUNTER — Other Ambulatory Visit: Payer: Self-pay | Admitting: Family Medicine

## 2024-06-13 ENCOUNTER — Telehealth: Payer: Self-pay

## 2024-06-13 DIAGNOSIS — J449 Chronic obstructive pulmonary disease, unspecified: Secondary | ICD-10-CM

## 2024-06-13 MED ORDER — BREZTRI AEROSPHERE 160-9-4.8 MCG/ACT IN AERO: 2.0000 | INHALATION_SPRAY | Freq: Two times a day (BID) | RESPIRATORY_TRACT | 11 refills | Status: AC

## 2024-06-13 MED ORDER — ZOLPIDEM TARTRATE 5 MG PO TABS: 5.0000 mg | ORAL_TABLET | Freq: Every evening | ORAL | 1 refills | Status: DC | PRN

## 2024-06-13 MED ORDER — TRAZODONE HCL 50 MG PO TABS: 50.0000 mg | ORAL_TABLET | Freq: Every evening | ORAL | 1 refills | Status: DC | PRN

## 2024-06-13 NOTE — Telephone Encounter (Signed)
 Fax received from [facility]. Pt's insurance will not cover Eszopiclone  3 mg.  The alternatives they will cover are Zolpidem  Tartrate 10 mg, Zaleplon 10 mg or Zolpidem  Tartrate ER 12.5 mg. Thanks.

## 2024-06-13 NOTE — Telephone Encounter (Signed)
 Returned patients call and let her know that with the BCG shortage we are calling patient when they come in and scheduling their treatments at that time. Pt voiced understanding.

## 2024-06-14 ENCOUNTER — Telehealth: Payer: Self-pay

## 2024-06-14 NOTE — Telephone Encounter (Signed)
 Received fax from [facility] stating Eszopiclone  3mg  tablet needs a prior auth.

## 2024-06-15 ENCOUNTER — Other Ambulatory Visit (HOSPITAL_COMMUNITY): Payer: Self-pay

## 2024-06-15 NOTE — Telephone Encounter (Signed)
 Good morning, I don't see an active precription for lunesta  it looks like Dr. Butler D/C'd her lunesta . I can submit a PA but of course I need an active script for lunesta  as well as her ICD 10 and diagnosis please.  Thanks!

## 2024-06-16 NOTE — Telephone Encounter (Signed)
 You're welcome dear, thanks for letting me know!

## 2024-06-20 ENCOUNTER — Telehealth: Payer: Self-pay | Admitting: Family Medicine

## 2024-06-20 NOTE — Telephone Encounter (Signed)
 Copied from CRM #[id number]. Topic: Clinical - Medication Question >> Jun 20, 2024 10:29 AM Kendralyn S wrote: Reason for CRM: pt is in a program to get budesonide -glycopyrrolate -formoterol  (BREZTRI  AEROSPHERE) 160-9-4.8 MCG/ACT AERO inhaler for free, the program needs a new order faxed to them   fax: [phone]

## 2024-06-21 NOTE — Telephone Encounter (Signed)
 Faxed to # listed.

## 2024-06-27 ENCOUNTER — Telehealth: Payer: Self-pay

## 2024-06-27 NOTE — Telephone Encounter (Signed)
 PAP: Patient assistance application for Breztri  through [employer] ([place] has been mailed to pt's home address on file. Provider portion of application will be faxed to provider's office.

## 2024-06-27 NOTE — Telephone Encounter (Signed)
 Copied from CRM #[id number]. Topic: Clinical - Medication Question >> Jun 20, 2024 10:29 AM Kendralyn S wrote: Reason for CRM: pt is in a program to get budesonide -glycopyrrolate -formoterol  (BREZTRI  AEROSPHERE) 160-9-4.8 MCG/ACT AERO inhaler for free, the program needs a new order faxed to them   fax: [phone] >> Jun 27, 2024  1:21 PM Montie POUR wrote: Please resend the order for budesonide -glycopyrrolate -formoterol  (BREZTRI  AEROSPHERE) 160-9-4.8 MCG/ACT AERO inhaler to [facility] Program so she can get it at no charge.  The fax number is [phone]. The [place] Program is stating that they did not receive fax. Thanks

## 2024-07-11 ENCOUNTER — Telehealth: Payer: Self-pay

## 2024-07-11 NOTE — Telephone Encounter (Signed)
 Copied from CRM #[id number]. Topic: Clinical - Prescription Issue >> Jul 11, 2024  2:45 PM Ivette P wrote: Reason for CRM: Alexa Orozco called in about medication budesonide -glycopyrrolate -formoterol  (BREZTRI  AEROSPHERE) 160-9-4.8 MCG/ACT AERO inhaler.   Did not have provider signature. Need a provider signature please call to follow up   [phone] - any customer service

## 2024-07-12 NOTE — Telephone Encounter (Signed)
 Contacted pharmacy, confirmed DR. Pickard's signature and how he prescribed medication. They stated they received prescription and that they would administer at prescribers discretion.

## 2024-07-17 ENCOUNTER — Other Ambulatory Visit (HOSPITAL_COMMUNITY): Payer: Self-pay

## 2024-07-17 NOTE — Telephone Encounter (Signed)
 Received patient portion PAP application [place] Breztri - refaxed provider portion

## 2024-07-25 ENCOUNTER — Ambulatory Visit (INDEPENDENT_AMBULATORY_CARE_PROVIDER_SITE_OTHER): Admitting: Physician Assistant

## 2024-07-25 DIAGNOSIS — C679 Malignant neoplasm of bladder, unspecified: Secondary | ICD-10-CM | POA: Diagnosis not present

## 2024-07-25 DIAGNOSIS — D494 Neoplasm of unspecified behavior of bladder: Secondary | ICD-10-CM

## 2024-07-25 DIAGNOSIS — N3941 Urge incontinence: Secondary | ICD-10-CM

## 2024-07-25 LAB — URINALYSIS, COMPLETE
Bilirubin, UA: NEGATIVE
Glucose, UA: NEGATIVE
Ketones, UA: NEGATIVE
Leukocytes,UA: NEGATIVE
Nitrite, UA: NEGATIVE
Protein,UA: NEGATIVE
Specific Gravity, UA: 1.025 (ref 1.005–1.030)
Urobilinogen, Ur: 0.2 mg/dL (ref 0.2–1.0)
pH, UA: 6 (ref 5.0–7.5)

## 2024-07-25 LAB — MICROSCOPIC EXAMINATION: Epithelial Cells (non renal): >10 /HPF — AB (ref 0–10)

## 2024-07-25 MED ORDER — BCG LIVE 50 MG IS SUSR
3.2400 mL | Freq: Once | INTRAVESICAL | Status: AC
  Administered 2024-07-25: 81 mg via INTRAVESICAL

## 2024-07-25 NOTE — Progress Notes (Signed)
 BCG Bladder Instillation  BCG # 1 of 3  Due to Bladder Cancer patient is present today for a BCG treatment. Patient was cleaned and prepped in a sterile fashion with betadine. A 14FR catheter was inserted, urine return was noted 1ml, urine was yellow in color. 30ml of reconstituted BCG was instilled into the bladder. The catheter was then removed. 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 She remains on trospium  but is off Gemtesa  due to cost. I gave her 3 weeks of samples today to get her through her maintenance treatments.  Follow up: 1 week

## 2024-07-25 NOTE — Patient Instructions (Signed)
 Your Timeline for Today:  Right now through 4:30pm: Hold your urine and do your quarter turns every 15 minutes. 4:30pm-10:30pm today: Every time you urinate, pour 1/2 cup of bleach into the toilet and let it sit for 15 minutes prior to flushing. 10:30pm onward: Resume your normal routine.   Patient Education: (BCG) Into the Bladder (Intravesical Chemotherapy)  BCG is a vaccine which is used to prevent tuberculosis (TB).  But it's also a helpful treatment for some early bladder cancers.  When BCG goes directly into the bladder the treatment is described as intravesical.  BCG is a type of immunotherapy.  Immunotherapy stimulates the body's immune system to destroy cancer cells.  How it's given BCG treatment is given to you in an outpatient setting.  It takes a few minutes to administer and you can go home as soon as it's finished.  It might be a good idea to ask someone to bring you, particularly the fist time.  Unlike chemotherapy into the bladder, BCG treatment is never given immediately after surgery to remove bladder tumors.  There needs to be a delay usually of at least two weeks after surgery, before you can have it.  You won't be given treatment with BCG if you are unwell or have an infection in your urine.  You're usually asked to limit the amount you drink before your treatment.  This will help to increase the concentration of BCG in your bladder.  Drinking too much before your treatment may make your bladder feel uncomfortably full.  If you normally take water tablets (diuretics) take them later in the day after your treatment.  Your nurse or doctor will give you more advise about preparing for your treatment.  You will have a small tube (catheter) placed into your bladder.  Your doctor will then put the liquid vaccine directly into your bladder through the catheter and remove the catheter.  You will need to hold your urine for two hours afterwards.  Rotating every 15 minutes from side to  side. This can be difficult but it's to give the treatment time to work.  When the treatment is over you can go to the toilet.  After your treatment there are some precautions you'll need to take.  This is because BCG is a live vaccine and other people shouldn't be exposed to it.  For the next six hours, you'll need to avoid your urine splashing on the toilet seat and getting any urine on your hands.  It might be easer for men to sit down when they're using an ordinary toilet although using a stand up urinal should be alright.  The main this is to avoid splashing urine and spreading the vaccine.  You will also be asked to put 1/2 cup undiluted bleach into the toilet to destroy any live vaccine and leave it for 15 minutes until you flush for the next 6 voids.  Side Effects Because BCG goes directly into the bladder most of the side effects are linked with the bladder.  They usually go away within one to two days after your treatment.  The most common ones are: -needing to pass urine often -pain when you pass urine -blood in urine -flu-like symptoms (tiredness, general aching and raised temperature)  Theses side effects should settle down within a day or two.  If they don't get better contact your doctor.  Drinking lots of fluids can help flush the drug out of your bladder and reduce some of these effects.  Taking Ibuprofen or Aleve is encouraged unless you have a condition that would make these medications unsafe to take (renal failure, diabetes, gerd)  Rare side effects can include a continuing high temperature (fever), pain in your joints and a cough.  If you have any of these symptoms, or if you feel generally unwell, contact your doctor.  These symptoms could be a sign of a more serious infection (due to BCG) that needs to be treated immediately.  If this happens you'll be treated with the same drugs (antibiotics) that are used to treat TB.  Contraception Men should use a condom during sex for  the first 48 hours after their treatment.  If you are a women who has had BCG treatment then your partner should use a condom.  Using a condom will protect your partner from any vaccine present in your semen or vaginal fluid.  We don't know how BCG may affect a developing fetus so it's not advisable to become pregnant or father a child while having it.  It is important to use effective contraception during your treatment and for six weeks afterwards.  You can discuss this with your doctor or specialist nurse.

## 2024-08-01 ENCOUNTER — Ambulatory Visit: Admitting: Physician Assistant

## 2024-08-01 VITALS — BP 132/74 | HR 61 | Ht 62.0 in | Wt 152.0 lb

## 2024-08-01 DIAGNOSIS — C679 Malignant neoplasm of bladder, unspecified: Secondary | ICD-10-CM | POA: Diagnosis not present

## 2024-08-01 DIAGNOSIS — D494 Neoplasm of unspecified behavior of bladder: Secondary | ICD-10-CM

## 2024-08-01 LAB — URINALYSIS, COMPLETE
Bilirubin, UA: NEGATIVE
Glucose, UA: NEGATIVE
Ketones, UA: NEGATIVE
Leukocytes,UA: NEGATIVE
Nitrite, UA: NEGATIVE
Protein,UA: NEGATIVE
RBC, UA: NEGATIVE
Specific Gravity, UA: 1.03 (ref 1.005–1.030)
Urobilinogen, Ur: 0.2 mg/dL (ref 0.2–1.0)
pH, UA: 6 (ref 5.0–7.5)

## 2024-08-01 LAB — MICROSCOPIC EXAMINATION: Epithelial Cells (non renal): >10 /HPF — AB (ref 0–10)

## 2024-08-01 MED ORDER — BCG LIVE 50 MG IS SUSR
3.2400 mL | Freq: Once | INTRAVESICAL | Status: AC
  Administered 2024-08-01: 81 mg via INTRAVESICAL

## 2024-08-01 NOTE — Progress Notes (Signed)
 BCG Bladder Instillation  BCG # 2 of 3  Due to Bladder Cancer patient is present today for a BCG treatment. Patient was cleaned and prepped in a sterile fashion with betadine. A 14FR catheter was inserted, urine return was noted 3ml, urine was yellow in color.  30ml of reconstituted BCG was instilled into the bladder. The catheter was then removed. Patient tolerated well, no complications were noted  Performed by: Mehlani Blankenburg, PA-C and Charisse Blackwell, CMA  Follow up/ Additional notes: 1 week

## 2024-08-01 NOTE — Telephone Encounter (Signed)
 PAP: Application for Breztri has been submitted to [employer] ([employer]), via fax

## 2024-08-07 NOTE — Telephone Encounter (Signed)
 PAP: Patient assistance application for Breztri  has been approved by PAP Companies: [employer] from 08/31/2024 to 08/30/2025. Medication should be delivered to PAP Delivery: Home. For further shipping updates, please contact [employer] ([employer]) at [phone]. Patient ID is: [id number]

## 2024-08-08 ENCOUNTER — Ambulatory Visit: Admitting: Physician Assistant

## 2024-08-08 ENCOUNTER — Encounter: Payer: Self-pay | Admitting: Physician Assistant

## 2024-08-08 VITALS — BP 124/79 | HR 75 | Ht 62.0 in | Wt 152.0 lb

## 2024-08-08 DIAGNOSIS — C679 Malignant neoplasm of bladder, unspecified: Secondary | ICD-10-CM

## 2024-08-08 MED ORDER — BCG LIVE 50 MG IS SUSR
3.2400 mL | Freq: Once | INTRAVESICAL | Status: AC
  Administered 2024-08-08: 81 mg via INTRAVESICAL

## 2024-08-08 NOTE — Patient Instructions (Signed)
 Your Timeline for Today:  Right now through 4:30pm: Hold your urine and do your quarter turns every 15 minutes. 4:30pm-10:30pm today: Every time you urinate, pour 1/2 cup of bleach into the toilet and let it sit for 15 minutes prior to flushing. 10:30pm onward: Resume your normal routine.

## 2024-08-08 NOTE — Progress Notes (Signed)
 BCG Bladder Instillation  BCG # 3 of 3  Due to Bladder Cancer patient is present today for a BCG treatment. Patient was cleaned and prepped in a sterile fashion with betadine. A 14FR catheter was inserted, urine return was noted 15ml, urine was yellow in color.  30ml of reconstituted BCG was instilled into the bladder. The catheter was then removed. Patient tolerated well, no complications were noted  Performed by: Mateja Dier, PA-C and Nya Bynum, CMA  Additional notes: Pyuria today without infection sx; proceeded with treatment, will send for culture.  Follow up: 1 month cysto with CTU prior

## 2024-08-09 LAB — MICROSCOPIC EXAMINATION: WBC, UA: >30 /HPF — AB (ref 0–5)

## 2024-08-09 LAB — URINALYSIS, COMPLETE
Bilirubin, UA: NEGATIVE
Glucose, UA: NEGATIVE
Ketones, UA: NEGATIVE
Nitrite, UA: NEGATIVE
Protein,UA: NEGATIVE
RBC, UA: NEGATIVE
Specific Gravity, UA: 1.025 (ref 1.005–1.030)
Urobilinogen, Ur: 0.2 mg/dL (ref 0.2–1.0)
pH, UA: 6 (ref 5.0–7.5)

## 2024-08-11 LAB — CULTURE, URINE COMPREHENSIVE

## 2024-08-14 ENCOUNTER — Other Ambulatory Visit: Payer: Self-pay | Admitting: Family Medicine

## 2024-08-14 DIAGNOSIS — I209 Angina pectoris, unspecified: Secondary | ICD-10-CM

## 2024-08-14 NOTE — Telephone Encounter (Unsigned)
 Copied from CRM #[id number]. Topic: Clinical - Medication Refill >> Aug 14, 2024  1:26 PM Alexa Orozco wrote: Medication:  pantoprazole  (PROTONIX ) 40 MG tablet  Has the patient contacted their pharmacy? Yes, no refills  This is the patient's preferred pharmacy:  [id number] - [place], [place] - [address] AT [facility] [address] [place] [address] Phone: [phone] Fax: [phone]  Is this the correct pharmacy for this prescription? Yes   Has the prescription been filled recently? No  Is the patient out of the medication? Yes  Has the patient been seen for an appointment in the last year OR does the patient have an upcoming appointment? Yes  Can we respond through MyChart? Yes  Agent: Please be advised that Rx refills may take up to 3 business days. We ask that you follow-up with your pharmacy.

## 2024-08-15 ENCOUNTER — Other Ambulatory Visit: Payer: Self-pay | Admitting: Family Medicine

## 2024-08-15 DIAGNOSIS — J439 Emphysema, unspecified: Secondary | ICD-10-CM

## 2024-08-17 NOTE — Telephone Encounter (Signed)
 Requested Prescriptions  Pending Prescriptions Disp Refills   pantoprazole  (PROTONIX ) 40 MG tablet 90 tablet 0    Sig: Take 1 tablet (40 mg total) by mouth every morning. TAKE 1 TABLET EVERY MORNING     Gastroenterology: Proton Pump Inhibitors Passed - 08/17/2024  9:42 AM      Passed - Valid encounter within last 12 months    Recent Outpatient Visits           7 months ago Toe pain, right   [facility] Pickard, Butler DASEN, MD   11 months ago Essential hypertension   [facility] Pickard, Butler DASEN, MD   1 year ago Other fatigue   [facility] Duanne Butler DASEN, MD   2 years ago Encounter for screening mammogram for malignant neoplasm of breast   [facility] Pickard, Butler DASEN, MD

## 2024-08-18 ENCOUNTER — Encounter: Payer: Self-pay | Admitting: Cardiology

## 2024-08-19 ENCOUNTER — Ambulatory Visit (HOSPITAL_COMMUNITY)
Admission: RE | Admit: 2024-08-19 | Discharge: 2024-08-19 | Disposition: A | Discharge status: Home / Self Care | Source: Ambulatory Visit | Attending: Acute Care | Admitting: Acute Care

## 2024-08-19 DIAGNOSIS — Z87891 Personal history of nicotine dependence: Secondary | ICD-10-CM | POA: Insufficient documentation

## 2024-08-19 DIAGNOSIS — F17210 Nicotine dependence, cigarettes, uncomplicated: Secondary | ICD-10-CM | POA: Diagnosis present

## 2024-08-19 DIAGNOSIS — Z122 Encounter for screening for malignant neoplasm of respiratory organs: Secondary | ICD-10-CM | POA: Diagnosis present

## 2024-08-30 ENCOUNTER — Other Ambulatory Visit: Payer: Self-pay

## 2024-08-30 DIAGNOSIS — Z122 Encounter for screening for malignant neoplasm of respiratory organs: Secondary | ICD-10-CM

## 2024-08-30 DIAGNOSIS — F17210 Nicotine dependence, cigarettes, uncomplicated: Secondary | ICD-10-CM

## 2024-08-30 DIAGNOSIS — Z87891 Personal history of nicotine dependence: Secondary | ICD-10-CM

## 2024-09-05 ENCOUNTER — Other Ambulatory Visit: Payer: Self-pay | Admitting: Family Medicine

## 2024-09-06 ENCOUNTER — Ambulatory Visit
Admission: RE | Admit: 2024-09-06 | Discharge: 2024-09-06 | Disposition: A | Discharge status: Home / Self Care | Source: Ambulatory Visit | Attending: Urology | Admitting: Urology

## 2024-09-06 DIAGNOSIS — Z8551 Personal history of malignant neoplasm of bladder: Secondary | ICD-10-CM | POA: Insufficient documentation

## 2024-09-06 MED ORDER — SODIUM CHLORIDE 0.9 % IV SOLN: INTRAVENOUS | Status: DC

## 2024-09-06 MED ORDER — IOHEXOL 300 MG/ML  SOLN
100.0000 mL | Freq: Once | INTRAMUSCULAR | Status: AC | PRN
  Administered 2024-09-06: 100 mL via INTRAVENOUS

## 2024-09-07 ENCOUNTER — Telehealth: Payer: Self-pay | Admitting: Cardiology

## 2024-09-07 NOTE — Telephone Encounter (Signed)
 Pt requesting to have a provider switch done to Dr. Nishan as she has moved to [place]. Please advise.

## 2024-09-07 NOTE — Telephone Encounter (Signed)
 Spoke with patient who is okay to seeing a [place] provider.  Patient requesting provider switch from Dr. Anner to Dr. Mallipeddi due to having moved to the [place] area.   Please advise is provider switch is acceptable.

## 2024-09-08 NOTE — Telephone Encounter (Signed)
 Totally makes sense [facility]

## 2024-09-13 ENCOUNTER — Ambulatory Visit: Admitting: Urology

## 2024-09-13 ENCOUNTER — Other Ambulatory Visit: Payer: Self-pay

## 2024-09-13 ENCOUNTER — Telehealth: Payer: Self-pay | Admitting: Family Medicine

## 2024-09-13 VITALS — BP 151/74 | HR 68 | Wt 152.0 lb

## 2024-09-13 DIAGNOSIS — Z8551 Personal history of malignant neoplasm of bladder: Secondary | ICD-10-CM

## 2024-09-13 DIAGNOSIS — J439 Emphysema, unspecified: Secondary | ICD-10-CM

## 2024-09-13 MED ORDER — LIDOCAINE HCL URETHRAL/MUCOSAL 2 % EX GEL
1.0000 | Freq: Once | CUTANEOUS | Status: AC
  Administered 2024-09-13: 1 via URETHRAL

## 2024-09-13 MED ORDER — ALBUTEROL SULFATE HFA 108 (90 BASE) MCG/ACT IN AERS: INHALATION_SPRAY | RESPIRATORY_TRACT | 1 refills | Status: AC

## 2024-09-13 MED ORDER — NITROFURANTOIN MONOHYD MACRO 100 MG PO CAPS
100.0000 mg | ORAL_CAPSULE | Freq: Once | ORAL | Status: AC
  Administered 2024-09-13: 100 mg via ORAL

## 2024-09-13 NOTE — Progress Notes (Signed)
 Bladder cancer surveillance note     UROLOGIC HISTORY Alexa Orozco is a 79 y.o. female who originally presented with gross hematuria and on CT was found to have a large 7+ cm bladder tumor at the right lateral wall, posterior wall, and anteriorly.   Initial Diagnosis of Bladder  Year: 10/2020 Pathology: High-grade T1 urothelial cell carcinoma   11/2020: Second look TURBT-pathology showing high-grade T1, muscle present and not involved     Treatments for Bladder Cancer Initial TURBT 10/2020 TURBT second look 11/2020 Induction BCG x6 completed July 2022 mBCG #1 05/2021 mBCG #2 10/2021 mBCG#3 05/2022 mBCG#4 12/2022 mBCG#5 10/2023 mBCG#6 07/2024   AUA Risk Category High   Cystoscopy Procedure Note:   Nitrofurantoin  given for prophylaxis  After informed consent and discussion of the procedure and its risks, Alexa Orozco was positioned and prepped in the standard fashion. Cystoscopy was performed with the a flexible cystoscope. The urethra, bladder neck and entire bladder was visualized in a standard fashion.  Bladder mucosa grossly normal throughout, no tumor seen.  No abnormalities on retroflexion.  Extensive scar at right side of the bladder.  She continues to smoke, but has cut back somewhat.  Smoking cessation was discussed again, and we reviewed this is the highest risk factor for bladder cancer recurrence.  Currently on Myrbetriq  50 mg daily for OAB symptoms with moderate results  CT urogram 09/06/2024 reviewed, no evidence of recurrence, formal read pending  -Completed 3 years BCG -Cystoscopy in 1 yr   Redell Burnet, MD 09/13/2024

## 2024-09-13 NOTE — Telephone Encounter (Signed)
 Prescription Request  09/13/2024  LOV: 01/06/2024  What is the name of the medication or equipment?   albuterol  (VENTOLIN  HFA) 108 (90 Base) MCG/ACT inhaler   Have you contacted your pharmacy to request a refill? Yes   Which pharmacy would you like this sent to?  [id number] [place] - [address] AT [facility] [address]  [place] [address] Phone: [phone] Fax: [phone]    Patient notified that their request is being sent to the clinical staff for review and that they should receive a response within 2 business days.   Please advise pharmacist.

## 2024-10-06 ENCOUNTER — Ambulatory Visit: Admitting: Family Medicine

## 2024-10-06 ENCOUNTER — Encounter: Payer: Self-pay | Admitting: Family Medicine

## 2024-10-06 VITALS — BP 132/82 | HR 57 | Temp 97.7°F | Ht 62.0 in | Wt 157.8 lb

## 2024-10-06 DIAGNOSIS — I10 Essential (primary) hypertension: Secondary | ICD-10-CM

## 2024-10-06 DIAGNOSIS — J449 Chronic obstructive pulmonary disease, unspecified: Secondary | ICD-10-CM

## 2024-10-06 DIAGNOSIS — I2510 Atherosclerotic heart disease of native coronary artery without angina pectoris: Secondary | ICD-10-CM

## 2024-10-06 DIAGNOSIS — E039 Hypothyroidism, unspecified: Secondary | ICD-10-CM

## 2024-10-06 LAB — CBC WITH DIFFERENTIAL/PLATELET
Absolute Lymphocytes: 2387 {cells}/uL (ref 850–3900)
Absolute Monocytes: 694 {cells}/uL (ref 200–950)
Basophils Absolute: 39 {cells}/uL (ref 0–200)
Basophils Relative: 0.5 %
Eosinophils Absolute: 117 {cells}/uL (ref 15–500)
Eosinophils Relative: 1.5 %
HCT: 35.4 % — ABNORMAL LOW (ref 35.9–46.0)
Hemoglobin: 11.1 g/dL — ABNORMAL LOW (ref 11.7–15.5)
MCH: 27 pg (ref 27.0–33.0)
MCHC: 31.4 g/dL — ABNORMAL LOW (ref 31.6–35.4)
MCV: 86.1 fL (ref 81.4–101.7)
MPV: 9.9 fL (ref 7.5–12.5)
Monocytes Relative: 8.9 %
Neutro Abs: 4563 {cells}/uL (ref 1500–7800)
Neutrophils Relative %: 58.5 %
Platelets: 358 10*3/uL (ref 140–400)
RBC: 4.11 Million/uL (ref 3.80–5.10)
RDW: 15.8 % — ABNORMAL HIGH (ref 11.0–15.0)
Total Lymphocyte: 30.6 %
WBC: 7.8 10*3/uL (ref 3.8–10.8)

## 2024-10-06 MED ORDER — VARENICLINE TARTRATE (STARTER) 0.5 MG X 11 & 1 MG X 42 PO TBPK: ORAL_TABLET | ORAL | 0 refills | Status: AC

## 2024-10-06 NOTE — Progress Notes (Signed)
 "  Subjective:    Patient ID: Alexa Orozco, female    DOB: 05/21/1946, 79 y.o.   MRN: [id number]  HPI Patient is a 79 year old Caucasian female with a history of coronary artery disease, hypertension, hyperlipidemia, emphysema and tobacco abuse who presents today for a checkup.  At the present time she is currently receiving treatment for bladder cancer/High-grade T1 urothelial cell carcinoma  since 2022.   Patient continues to cough.  Cough is nonproductive.  She has a history of COPD.  She is currently on Breztri .  She denies any hemoptysis or chest pain.  She had a CT scan of the lungs to screen for lung cancer in December that showed no evidence of lung cancer although it did show coronary artery calcification.  She denies any angina.  She denies any orthopnea.  She does report dyspnea on exertion.  Patient is overdue for fasting lab work to monitor her cholesterol and her thyroid .  Her blood pressure is well-controlled today at 132/82. Past Medical History:  Diagnosis Date   Allergies    Allergy    Anxiety    Asthma    Bladder cancer (HCC)    CAD S/P percutaneous coronary angioplasty 10/2005   Class III Angina --> Cath: 85% -- PCI 3.37mm x 18mm (4.0 mm) Cypher DES to RCA; Myoview October 2014: normal stress test: LOW RISK; mild anteroapical breast attenuation   Cataract    h/o bilat repair   Claudication 06/01/2013   COPD (chronic obstructive pulmonary disease) (HCC)    Depression 11/29/2013   Emphysema of lung (HCC)    GERD (gastroesophageal reflux disease)    History of palpitations    History of tobacco abuse    Hyperlipidemia    Hypothyroidism    Osteoporosis    Raynaud disease    Past Surgical History:  Procedure Laterality Date   ABDOMINAL HYSTERECTOMY     h/o partila hysterectomy   BREAST BIOPSY  08/31/1986   CARPAL TUNNEL RELEASE Right 04/04/2024   Procedure: CARPAL TUNNEL RELEASE;  Surgeon: Murrell Drivers, MD;  Location: [facility];  Service: Orthopedics;   Laterality: Right;   COLONOSCOPY  2006   Dr. Obie: multiple hyperplastic polyps   COLONOSCOPY  2001   adenomatous polyps   COLONOSCOPY WITH PROPOFOL  N/A 04/24/2022   Procedure: COLONOSCOPY WITH PROPOFOL ;  Surgeon: Shaaron Lamar HERO, MD;  [facility];  Service: Endoscopy;  Laterality: N/A;  1:00pm, asa 3   CORONARY ANGIOPLASTY WITH STENT PLACEMENT  10/14/2005   cypher DES (3.27mmx18mm) to high grade RCA lesion   NM MYOVIEW LTD  July 2012 of October 2014   '12: dipyridamole; Normal, low risk study; 2014 - normal stress test: LOW RISK; mild anteroapical breast attenuation   POLYPECTOMY  04/24/2022   Procedure: POLYPECTOMY;  Surgeon: Shaaron Lamar HERO, MD;  [facility];  Service: Endoscopy;;   TONSILLECTOMY  08/31/1957   TRANSTHORACIC ECHOCARDIOGRAM  05/22/2009   EF=>55%, normal LV systolic function; normal RV systolic function; mild MR/TR   TRANSURETHRAL RESECTION OF BLADDER TUMOR N/A 11/08/2020   Procedure: TRANSURETHRAL RESECTION OF BLADDER TUMOR (TURBT);  Surgeon: Francisca Redell BROCKS, MD;  Location: [facility];  Service: Urology;  Laterality: N/A;   TRANSURETHRAL RESECTION OF BLADDER TUMOR N/A 12/13/2020   Procedure: TRANSURETHRAL RESECTION OF BLADDER TUMOR (TURBT);  Surgeon: Francisca Redell BROCKS, MD;  Location: [facility];  Service: Urology;  Laterality: N/A;   Current Outpatient Medications on File Prior to Visit  Medication Sig Dispense Refill  albuterol  (VENTOLIN  HFA) 108 (90 Base) MCG/ACT inhaler USE 2 INHALATIONS ORALLY   EVERY 6 HOURS AS NEEDED FORWHEEZING OR SHORTNESS OF   BREATH 18 each 1   budesonide -glycopyrrolate -formoterol  (BREZTRI  AEROSPHERE) 160-9-4.8 MCG/ACT AERO inhaler Inhale 2 puffs into the lungs 2 (two) times daily. 10.7 g 11   clonazePAM  (KLONOPIN ) 0.5 MG tablet Take 1 tablet (0.5 mg total) by mouth 2 (two) times daily as needed for anxiety (stop lorazepam ). 30 tablet 2   clopidogrel  (PLAVIX ) 75 MG tablet Take 1 tablet (75 mg total) by mouth daily. 90 tablet 3    diclofenac  Sodium (VOLTAREN ) 1 % GEL APPLY 2 G TOPICALLY 4 (FOUR) TIMES DAILY AS NEEDED (PAIN). 100 g 1   gabapentin  (NEURONTIN ) 300 MG capsule Take 1 capsule (300 mg total) by mouth 3 (three) times daily as needed (nerve pain). 270 capsule 0   levothyroxine  (SYNTHROID ) 88 MCG tablet TAKE 1 TABLET DAILY 90 tablet 3   LORazepam  (ATIVAN ) 0.5 MG tablet TAKE 1 TABLET BY MOUTH 2 TIMES DAILY AS NEEDED FOR ANXIETY. 30 tablet 0   meclizine  (ANTIVERT ) 25 MG tablet Take 0.5 tablets (12.5 mg total) by mouth 3 (three) times daily as needed for dizziness. 30 tablet 0   mirabegron  ER (MYRBETRIQ ) 50 MG TB24 tablet Take 1 tablet (50 mg total) by mouth daily. 90 tablet 3   montelukast  (SINGULAIR ) 10 MG tablet TAKE 1 TABLET AT BEDTIMETAKE 1 TABLET AT BEDTIME 30 tablet 0   mupirocin  ointment (BACTROBAN ) 2 % Apply 1 Application topically 2 (two) times daily. 30 g 0   nitroGLYCERIN  (NITROSTAT ) 0.4 MG SL tablet Place 1 tablet (0.4 mg total) under the tongue every 5 (five) minutes as needed for chest pain. 25 tablet 6   pantoprazole  (PROTONIX ) 40 MG tablet Take 1 tablet (40 mg total) by mouth every morning. TAKE 1 TABLET EVERY MORNING 90 tablet 0   rosuvastatin  (CRESTOR ) 20 MG tablet Take 1 tablet (20 mg total) by mouth daily. Stop pravastatin  90 tablet 3   sertraline  (ZOLOFT ) 100 MG tablet TAKE 1 AND 1/2 TABLETS     DAILY 135 tablet 1   traZODone  (DESYREL ) 50 MG tablet Take 1 tablet (50 mg total) by mouth at bedtime as needed. for sleep 90 tablet 1   zolpidem  (AMBIEN ) 10 MG tablet Take 0.5 tablets (5 mg total) by mouth at bedtime as needed for sleep. Stop eszopiclone  15 tablet 1   No current facility-administered medications on file prior to visit.   Allergies  Allergen Reactions   Bystolic [Nebivolol Hcl] Hives   Cephalexin Hives    Keflex*   Effexor  [Venlafaxine ] Other (See Comments)    Made her face hot and red   Elemental Sulfur Dermatitis   Levaquin  [Levofloxacin  In D5w] Other (See Comments)    Caused  face to turn red and patient became extremely hot    Sulfur Dermatitis   Social History   Socioeconomic History   Marital status: Widowed    Spouse name: Not on file   Number of children: 3   Years of education: 10th    Highest education level: Not on file  Occupational History   Occupation: Retired  Tobacco Use   Smoking status: Every Day    Current packs/day: 0.50    Average packs/day: 0.5 packs/day for 35.0 years (17.5 ttl pk-yrs)    Types: Cigarettes    Passive exposure: Never   Smokeless tobacco: Never  Vaping Use   Vaping status: Never Used  Substance and Sexual Activity  Alcohol use: No   Drug use: No   Sexual activity: Not Currently    Birth control/protection: Surgical    Comment: hyst  Other Topics Concern   Not on file  Social History Narrative   She is a widowed mother of 3, grandmother for, great-grandmother 1.  She still smokes about half pack a day.  She doesn't have about 35 years.  She does not drink.   Prior to her symptoms coming on, she used to do all kind of walking around and working in the garden and other activities with her close friend.   Social Drivers of Health   Tobacco Use: High Risk (10/06/2024)   Patient History    Smoking Tobacco Use: Every Day    Smokeless Tobacco Use: Never    Passive Exposure: Never  Financial Resource Strain: Low Risk (05/31/2024)   Overall Financial Resource Strain (CARDIA)    Difficulty of Paying Living Expenses: Not hard at all  Food Insecurity: No Food Insecurity (05/31/2024)   Epic    Worried About Programme Researcher, Broadcasting/film/video in the Last Year: Never true    Ran Out of Food in the Last Year: Never true  Transportation Needs: No Transportation Needs (05/31/2024)   Epic    Lack of Transportation (Medical): No    Lack of Transportation (Non-Medical): No  Physical Activity: Insufficiently Active (05/31/2024)   Exercise Vital Sign    Days of Exercise per Week: 2 days    Minutes of Exercise per Session: 20 min  Stress: No  Stress Concern Present (05/31/2024)   [employer] of Occupational Health - Occupational Stress Questionnaire    Feeling of Stress: Not at all  Social Connections: Moderately Integrated (05/31/2024)   Social Connection and Isolation Panel    Frequency of Communication with Friends and Family: More than three times a week    Frequency of Social Gatherings with Friends and Family: Three times a week    Attends Religious Services: More than 4 times per year    Active Member of Clubs or Organizations: Yes    Attends Banker Meetings: More than 4 times per year    Marital Status: Widowed  Intimate Partner Violence: Not At Risk (05/31/2024)   Epic    Fear of Current or Ex-Partner: No    Emotionally Abused: No    Physically Abused: No    Sexually Abused: No  Depression (PHQ2-9): Low Risk (05/31/2024)   Depression (PHQ2-9)    PHQ-2 Score: 0  Alcohol Screen: Low Risk (05/31/2024)   Alcohol Screen    Last Alcohol Screening Score (AUDIT): 0  Housing: Unknown (05/31/2024)   Epic    Unable to Pay for Housing in the Last Year: No    Number of Times Moved in the Last Year: Not on file    Homeless in the Last Year: No  Utilities: Not At Risk (05/31/2024)   Epic    Threatened with loss of utilities: No  Health Literacy: Adequate Health Literacy (05/31/2024)   B1300 Health Literacy    Frequency of need for help with medical instructions: Never      Review of Systems  All other systems reviewed and are negative.      Objective:   Physical Exam Vitals reviewed.  Constitutional:      General: She is not in acute distress.    Appearance: Normal appearance. She is normal weight. She is not ill-appearing or toxic-appearing.  HENT:     Head: Normocephalic and atraumatic.  Cardiovascular:     Rate and Rhythm: Normal rate and regular rhythm.     Pulses:          Dorsalis pedis pulses are 2+ on the right side and 2+ on the left side.       Posterior tibial pulses are 2+ on the  right side and 2+ on the left side.     Heart sounds: Normal heart sounds.  Pulmonary:     Effort: Pulmonary effort is normal.     Breath sounds: Decreased air movement present. Decreased breath sounds and wheezing present. No rhonchi or rales.  Abdominal:     General: Bowel sounds are normal.     Palpations: Abdomen is soft.     Tenderness: There is no abdominal tenderness.  Lymphadenopathy:     Cervical: No cervical adenopathy.  Neurological:     Mental Status: She is alert.         Assessment & Plan:  Coronary artery disease involving native coronary artery of native heart without angina pectoris - Plan: CBC with Differential/Platelet, Comprehensive metabolic panel with GFR, Lipid panel, TSH  Essential hypertension - Plan: CBC with Differential/Platelet, Comprehensive metabolic panel with GFR, Lipid panel, TSH  Hypothyroidism, unspecified type - Plan: TSH  Chronic obstructive pulmonary disease, unspecified COPD type (HCC) Patient recently had a CT scan of the lungs that showed emphysema and coronary artery calcifications but no explanation for the cough.  She denies symptoms of pneumonia or congestive heart failure.  I believe the cough is likely due to her COPD and her chronic smoking.  She is already on Breztri .  Recommended smoking cessation.  Prescribed for the patient Chantix  starter pack and we discussed how to use the medication.  I will check a TSH to ensure adequate dosage of her levothyroxine .  Her blood pressure is well-controlled.  I will check a CMP and a fasting lipid panel.  I like to see her LDL cholesterol less than 70 given her history of coronary artery disease.  I recommended the patient discontinue trazodone  since it is not working.  I cautioned the patient about combining lorazepam  and Ambien .  I encouraged her to use lorazepam  extremely sparingly to prevent falls.  Patient states that she needs Ambien  in order to sleep.  She is unable to sleep without it "

## 2024-10-12 ENCOUNTER — Ambulatory Visit: Admitting: Physician Assistant

## 2025-06-06 ENCOUNTER — Ambulatory Visit

## 2025-09-13 ENCOUNTER — Other Ambulatory Visit: Admitting: Urology
